# Patient Record
Sex: Male | Born: 1950
Health system: Southern US, Community
[De-identification: ages and names within clinical notes are randomized; demographics above are authoritative.]

## PROBLEM LIST (undated history)

## (undated) DIAGNOSIS — G4730 Sleep apnea, unspecified: Secondary | ICD-10-CM

## (undated) DIAGNOSIS — T7840XA Allergy, unspecified, initial encounter: Secondary | ICD-10-CM

## (undated) DIAGNOSIS — E669 Obesity, unspecified: Secondary | ICD-10-CM

## (undated) DIAGNOSIS — R011 Cardiac murmur, unspecified: Secondary | ICD-10-CM

## (undated) DIAGNOSIS — R9431 Abnormal electrocardiogram [ECG] [EKG]: Principal | ICD-10-CM

## (undated) DIAGNOSIS — J189 Pneumonia, unspecified organism: Secondary | ICD-10-CM

## (undated) DIAGNOSIS — J45909 Unspecified asthma, uncomplicated: Secondary | ICD-10-CM

## (undated) DIAGNOSIS — G709 Myoneural disorder, unspecified: Secondary | ICD-10-CM

## (undated) DIAGNOSIS — M1990 Unspecified osteoarthritis, unspecified site: Secondary | ICD-10-CM

## (undated) DIAGNOSIS — E785 Hyperlipidemia, unspecified: Secondary | ICD-10-CM

## (undated) DIAGNOSIS — K759 Inflammatory liver disease, unspecified: Secondary | ICD-10-CM

## (undated) DIAGNOSIS — N400 Enlarged prostate without lower urinary tract symptoms: Secondary | ICD-10-CM

## (undated) DIAGNOSIS — H269 Unspecified cataract: Secondary | ICD-10-CM

## (undated) DIAGNOSIS — F419 Anxiety disorder, unspecified: Secondary | ICD-10-CM

## (undated) DIAGNOSIS — I10 Essential (primary) hypertension: Secondary | ICD-10-CM

## (undated) DIAGNOSIS — I2510 Atherosclerotic heart disease of native coronary artery without angina pectoris: Secondary | ICD-10-CM

## (undated) DIAGNOSIS — C801 Malignant (primary) neoplasm, unspecified: Secondary | ICD-10-CM

## (undated) DIAGNOSIS — IMO0001 Reserved for inherently not codable concepts without codable children: Secondary | ICD-10-CM

## (undated) HISTORY — DX: Essential (primary) hypertension: I10

## (undated) HISTORY — PX: JOINT REPLACEMENT: SHX530

## (undated) HISTORY — DX: Allergy, unspecified, initial encounter: T78.40XA

## (undated) HISTORY — PX: OTHER SURGICAL HISTORY: SHX169

## (undated) HISTORY — DX: Unspecified cataract: H26.9

## (undated) HISTORY — DX: Hyperlipidemia, unspecified: E78.5

## (undated) HISTORY — PX: PROSTATECTOMY: SHX69

## (undated) HISTORY — PX: UMBILICAL HERNIA REPAIR: SHX196

## (undated) HISTORY — PX: TONSILLECTOMY AND ADENOIDECTOMY: SUR1326

---

## 1998-11-21 ENCOUNTER — Emergency Department (HOSPITAL_COMMUNITY): Admission: EM | Admit: 1998-11-21 | Discharge: 1998-11-21 | Payer: Self-pay | Admitting: Emergency Medicine

## 1999-05-10 ENCOUNTER — Ambulatory Visit (HOSPITAL_COMMUNITY): Admission: RE | Admit: 1999-05-10 | Discharge: 1999-05-10 | Payer: Self-pay | Admitting: Cardiology

## 1999-05-10 ENCOUNTER — Encounter: Payer: Self-pay | Admitting: Cardiology

## 2003-12-08 ENCOUNTER — Ambulatory Visit (HOSPITAL_COMMUNITY): Admission: RE | Admit: 2003-12-08 | Discharge: 2003-12-08 | Payer: Self-pay | Admitting: Cardiology

## 2003-12-13 ENCOUNTER — Encounter: Payer: Self-pay | Admitting: Internal Medicine

## 2003-12-21 ENCOUNTER — Ambulatory Visit (HOSPITAL_BASED_OUTPATIENT_CLINIC_OR_DEPARTMENT_OTHER): Admission: RE | Admit: 2003-12-21 | Discharge: 2003-12-21 | Payer: Self-pay | Admitting: Cardiology

## 2005-02-27 ENCOUNTER — Ambulatory Visit (HOSPITAL_COMMUNITY): Admission: RE | Admit: 2005-02-27 | Discharge: 2005-02-27 | Payer: Self-pay | Admitting: Orthopedic Surgery

## 2005-04-17 ENCOUNTER — Encounter: Payer: Self-pay | Admitting: Internal Medicine

## 2005-04-24 ENCOUNTER — Encounter: Payer: Self-pay | Admitting: Internal Medicine

## 2005-10-23 ENCOUNTER — Ambulatory Visit (HOSPITAL_COMMUNITY): Admission: RE | Admit: 2005-10-23 | Discharge: 2005-10-23 | Payer: Self-pay | Admitting: Surgery

## 2006-08-19 ENCOUNTER — Ambulatory Visit (HOSPITAL_COMMUNITY): Admission: RE | Admit: 2006-08-19 | Discharge: 2006-08-19 | Payer: Self-pay | Admitting: Neurosurgery

## 2006-09-05 ENCOUNTER — Encounter: Payer: Self-pay | Admitting: Internal Medicine

## 2006-09-08 ENCOUNTER — Encounter: Payer: Self-pay | Admitting: Internal Medicine

## 2006-12-12 ENCOUNTER — Ambulatory Visit (HOSPITAL_COMMUNITY): Admission: RE | Admit: 2006-12-12 | Discharge: 2006-12-12 | Payer: Self-pay | Admitting: Orthopedic Surgery

## 2006-12-17 ENCOUNTER — Encounter: Payer: Self-pay | Admitting: Internal Medicine

## 2006-12-22 ENCOUNTER — Ambulatory Visit (HOSPITAL_COMMUNITY): Admission: RE | Admit: 2006-12-22 | Discharge: 2006-12-23 | Payer: Self-pay | Admitting: Orthopedic Surgery

## 2007-08-28 ENCOUNTER — Encounter: Payer: Self-pay | Admitting: Internal Medicine

## 2008-11-30 ENCOUNTER — Encounter: Payer: Self-pay | Admitting: Pulmonary Disease

## 2009-01-30 ENCOUNTER — Emergency Department (HOSPITAL_COMMUNITY): Admission: EM | Admit: 2009-01-30 | Discharge: 2009-01-30 | Payer: Self-pay | Admitting: Family Medicine

## 2009-05-10 ENCOUNTER — Ambulatory Visit: Payer: Self-pay | Admitting: Internal Medicine

## 2009-05-10 DIAGNOSIS — I10 Essential (primary) hypertension: Secondary | ICD-10-CM | POA: Insufficient documentation

## 2009-05-10 DIAGNOSIS — G4733 Obstructive sleep apnea (adult) (pediatric): Secondary | ICD-10-CM | POA: Insufficient documentation

## 2009-05-10 DIAGNOSIS — J45909 Unspecified asthma, uncomplicated: Secondary | ICD-10-CM

## 2009-05-10 DIAGNOSIS — J309 Allergic rhinitis, unspecified: Secondary | ICD-10-CM

## 2009-05-10 DIAGNOSIS — Z9889 Other specified postprocedural states: Secondary | ICD-10-CM | POA: Insufficient documentation

## 2009-05-10 DIAGNOSIS — S82409A Unspecified fracture of shaft of unspecified fibula, initial encounter for closed fracture: Secondary | ICD-10-CM | POA: Insufficient documentation

## 2009-05-10 DIAGNOSIS — M66329 Spontaneous rupture of flexor tendons, unspecified upper arm: Secondary | ICD-10-CM | POA: Insufficient documentation

## 2009-05-10 DIAGNOSIS — Z9089 Acquired absence of other organs: Secondary | ICD-10-CM | POA: Insufficient documentation

## 2009-05-10 DIAGNOSIS — Z8619 Personal history of other infectious and parasitic diseases: Secondary | ICD-10-CM | POA: Insufficient documentation

## 2009-05-10 DIAGNOSIS — IMO0002 Reserved for concepts with insufficient information to code with codable children: Secondary | ICD-10-CM | POA: Insufficient documentation

## 2009-05-10 DIAGNOSIS — Z87898 Personal history of other specified conditions: Secondary | ICD-10-CM | POA: Insufficient documentation

## 2009-05-11 ENCOUNTER — Encounter (INDEPENDENT_AMBULATORY_CARE_PROVIDER_SITE_OTHER): Payer: Self-pay | Admitting: *Deleted

## 2009-05-11 LAB — CONVERTED CEMR LAB
ALT: 64 units/L — ABNORMAL HIGH (ref 0–53)
Albumin: 4.4 g/dL (ref 3.5–5.2)
Alkaline Phosphatase: 64 units/L (ref 39–117)
Basophils Relative: 0.6 % (ref 0.0–3.0)
Bilirubin, Direct: 0.1 mg/dL (ref 0.0–0.3)
CO2: 31 meq/L (ref 19–32)
Calcium: 9.1 mg/dL (ref 8.4–10.5)
Chloride: 100 meq/L (ref 96–112)
Creatinine, Ser: 0.7 mg/dL (ref 0.4–1.5)
Eosinophils Relative: 3 % (ref 0.0–5.0)
Hemoglobin: 14.8 g/dL (ref 13.0–17.0)
Lymphocytes Relative: 34.9 % (ref 12.0–46.0)
MCHC: 35.4 g/dL (ref 30.0–36.0)
MCV: 95.3 fL (ref 78.0–100.0)
Neutro Abs: 2.9 10*3/uL (ref 1.4–7.7)
Neutrophils Relative %: 53.1 % (ref 43.0–77.0)
RBC: 4.39 M/uL (ref 4.22–5.81)
Sodium: 141 meq/L (ref 135–145)
Total Protein: 7.6 g/dL (ref 6.0–8.3)
WBC: 5.3 10*3/uL (ref 4.5–10.5)

## 2009-05-12 ENCOUNTER — Encounter (INDEPENDENT_AMBULATORY_CARE_PROVIDER_SITE_OTHER): Payer: Self-pay | Admitting: *Deleted

## 2009-05-30 ENCOUNTER — Telehealth (INDEPENDENT_AMBULATORY_CARE_PROVIDER_SITE_OTHER): Payer: Self-pay | Admitting: *Deleted

## 2009-06-13 ENCOUNTER — Encounter (INDEPENDENT_AMBULATORY_CARE_PROVIDER_SITE_OTHER): Payer: Self-pay | Admitting: *Deleted

## 2009-06-14 ENCOUNTER — Ambulatory Visit: Payer: Self-pay | Admitting: Internal Medicine

## 2009-06-14 LAB — HM COLONOSCOPY

## 2009-06-26 ENCOUNTER — Telehealth (INDEPENDENT_AMBULATORY_CARE_PROVIDER_SITE_OTHER): Payer: Self-pay | Admitting: *Deleted

## 2009-06-28 ENCOUNTER — Ambulatory Visit: Payer: Self-pay | Admitting: Internal Medicine

## 2009-10-04 ENCOUNTER — Ambulatory Visit: Payer: Self-pay | Admitting: Internal Medicine

## 2009-10-04 LAB — CONVERTED CEMR LAB
Blood in Urine, dipstick: NEGATIVE
Nitrite: NEGATIVE
Protein, U semiquant: NEGATIVE
Urobilinogen, UA: 0.2
WBC Urine, dipstick: NEGATIVE

## 2009-10-05 ENCOUNTER — Encounter: Payer: Self-pay | Admitting: Internal Medicine

## 2009-10-06 LAB — CONVERTED CEMR LAB
BUN: 18 mg/dL (ref 6–23)
Creatinine,U: 59.3 mg/dL
Hgb A1c MFr Bld: 12.6 % — ABNORMAL HIGH (ref 4.6–6.5)
Microalb, Ur: 2.5 mg/dL — ABNORMAL HIGH (ref 0.0–1.9)
Potassium: 4.1 meq/L (ref 3.5–5.1)

## 2009-10-24 ENCOUNTER — Telehealth: Payer: Self-pay | Admitting: Internal Medicine

## 2009-11-08 ENCOUNTER — Encounter: Payer: Self-pay | Admitting: Internal Medicine

## 2009-11-27 ENCOUNTER — Telehealth (INDEPENDENT_AMBULATORY_CARE_PROVIDER_SITE_OTHER): Payer: Self-pay | Admitting: *Deleted

## 2009-11-30 ENCOUNTER — Ambulatory Visit: Payer: Self-pay | Admitting: Internal Medicine

## 2009-12-01 LAB — CONVERTED CEMR LAB
BUN: 16 mg/dL (ref 6–23)
Hgb A1c MFr Bld: 7.7 % — ABNORMAL HIGH (ref 4.6–6.5)
Microalb Creat Ratio: 1.2 mg/g (ref 0.0–30.0)

## 2009-12-12 ENCOUNTER — Telehealth (INDEPENDENT_AMBULATORY_CARE_PROVIDER_SITE_OTHER): Payer: Self-pay | Admitting: *Deleted

## 2009-12-19 ENCOUNTER — Telehealth (INDEPENDENT_AMBULATORY_CARE_PROVIDER_SITE_OTHER): Payer: Self-pay | Admitting: *Deleted

## 2010-01-08 ENCOUNTER — Encounter: Payer: Self-pay | Admitting: Internal Medicine

## 2010-03-07 ENCOUNTER — Ambulatory Visit: Payer: Self-pay | Admitting: Internal Medicine

## 2010-03-07 LAB — CONVERTED CEMR LAB: Hgb A1c MFr Bld: 6.1 % (ref 4.6–6.5)

## 2010-03-14 ENCOUNTER — Telehealth (INDEPENDENT_AMBULATORY_CARE_PROVIDER_SITE_OTHER): Payer: Self-pay | Admitting: *Deleted

## 2010-03-30 ENCOUNTER — Telehealth (INDEPENDENT_AMBULATORY_CARE_PROVIDER_SITE_OTHER): Payer: Self-pay | Admitting: *Deleted

## 2010-04-11 ENCOUNTER — Encounter
Admission: RE | Admit: 2010-04-11 | Discharge: 2010-04-11 | Payer: Self-pay | Source: Home / Self Care | Attending: Internal Medicine | Admitting: Internal Medicine

## 2010-05-14 ENCOUNTER — Encounter: Payer: Self-pay | Admitting: Internal Medicine

## 2010-05-14 ENCOUNTER — Telehealth (INDEPENDENT_AMBULATORY_CARE_PROVIDER_SITE_OTHER): Payer: Self-pay | Admitting: *Deleted

## 2010-05-30 ENCOUNTER — Ambulatory Visit: Payer: Self-pay | Admitting: Internal Medicine

## 2010-05-30 ENCOUNTER — Encounter: Payer: Self-pay | Admitting: Internal Medicine

## 2010-05-30 DIAGNOSIS — Z860100 Personal history of colon polyps, unspecified: Secondary | ICD-10-CM | POA: Insufficient documentation

## 2010-05-30 DIAGNOSIS — R9431 Abnormal electrocardiogram [ECG] [EKG]: Secondary | ICD-10-CM

## 2010-07-04 ENCOUNTER — Encounter: Payer: Self-pay | Admitting: Internal Medicine

## 2010-07-17 ENCOUNTER — Telehealth (INDEPENDENT_AMBULATORY_CARE_PROVIDER_SITE_OTHER): Payer: Self-pay | Admitting: *Deleted

## 2010-08-14 NOTE — Assessment & Plan Note (Signed)
Summary: URINATE ALOT/THIRSTY/WEIGHT LOSS/KDC   Vital Signs:  Patient profile:   60 year old male Weight:      275.6 pounds Temp:     98.6 degrees F oral Pulse rate:   64 / minute Resp:     14 per minute BP sitting:   142 / 78  (left arm) Cuff size:   large  Vitals Entered By: Shonna Chock (October 04, 2009 11:54 AM) CC: Frequent urination, weight loss, and thristy a lot x 3 weeks (? Diabetes) Comments REVIEWED MED LIST, PATIENT AGREED DOSE AND INSTRUCTION CORRECT    CC:  Frequent urination, weight loss, and and thristy a lot x 3 weeks (? Diabetes).  History of Present Illness: Weight loss 30# over 4 weeksin context of  polyuria, urgency ,polyphagia, & polydipsia.A1c previously  was 7%;no glucose monitoring.No specific diet ; CVE has decreased due  to fatigue.Nocturia is affecting sleep & contributing to fatigue. Statin never started due to PMH of hepatitis.  Allergies: 1)  ! Tetracycline  Review of Systems General:  Complains of fatigue and weight loss; denies chills, fever, and sweats. Eyes:  Complains of blurring; denies double vision and vision loss-both eyes. ENT:  Complains of nasal congestion and sinus pressure; No facial , frontal headache or purulence.. CV:  Complains of lightheadness; denies chest pain or discomfort and near fainting. Resp:  Complains of cough; denies sputum productive; Flu & bronchitis in 07/2009 , residual wheezing. Xoponex as needed . CPAP effective. GU:  Complains of nocturia and urinary frequency; denies discharge, dysuria, hematuria, and urinary hesitancy; Nocturia every 1.5-2 hrs. On Flomax for BPH. Derm:  Denies poor wound healing. Neuro:  Denies numbness and tingling; No burning. Endo:  Complains of excessive hunger, excessive thirst, excessive urination, polyuria, and weight change.  Physical Exam  General:  in no acute distress; alert,appropriate and cooperative throughout examination Ears:  External ear exam shows no significant lesions or  deformities.  Otoscopic examination reveals clear canals, tympanic membranes are intact bilaterally without bulging, retraction, inflammation or discharge. Hearing is grossly normal bilaterally. Nose:  External nasal examination shows no deformity or inflammation. Nasal mucosa are  dry without lesions or exudates. Mouth:  Oral mucosa and oropharynx without lesions or exudates.  Teeth in good repair. No pharyngeal erythema.   Lungs:  normal respiratory effort, no intercostal retractions, and no accessory muscle use.  Mild low grade wheezing  Heart:  Normal rate and regular rhythm. S1 and S2 normal without gallop, murmur, click, rub. S4 Pulses:  R and L carotid,radial,dorsalis pedis and posterior tibial pulses are full and equal bilaterally Extremities:  No clubbing, cyanosis, edema..   Neurologic:  sensation intact to light touch.   Cervical Nodes:  No lymphadenopathy noted Axillary Nodes:  No palpable lymphadenopathy Psych:  memory intact for recent and remote, normally interactive, and good eye contact.     Impression & Recommendations:  Problem # 1:  WEIGHT LOSS (ICD-783.21)  Orders: Venipuncture ([address]) TLB-A1C / Hgb A1C (Glycohemoglobin) (83036-A1C) TLB-Microalbumin/Creat Ratio, Urine (82043-MALB)  Problem # 2:  POLYURIA (UEA-540.98)  Orders: Venipuncture (11914) TLB-Creatinine, Blood (82565-CREA) TLB-Potassium (K+) (84132-K) TLB-BUN (Urea Nitrogen) (84520-BUN) TLB-A1C / Hgb A1C (Glycohemoglobin) (83036-A1C) TLB-Microalbumin/Creat Ratio, Urine (82043-MALB) T-Culture, Urine ([address]-62130)  Problem # 3:  POLYDIPSIA (ICD-783.5)  Orders: Venipuncture (86578) TLB-A1C / Hgb A1C (Glycohemoglobin) (83036-A1C) TLB-Microalbumin/Creat Ratio, Urine (82043-MALB)  Problem # 4:  HYPERLIPIDEMIA (ICD-272.4)  Orders: Venipuncture (46962)  Complete Medication List: 1)  Multivitamins Tabs (Multiple vitamin) .Marland Kitchen.. 1 by mouth once daily 2)  Diovan Hct 320-25 Mg Tabs  (Valsartan-hydrochlorothiazide) .Marland Kitchen.. 1 by mouth once daily 3)  Flomax 0.4 Mg Caps (Tamsulosin hcl) .Marland Kitchen.. 1 by mouth once daily 4)  Amlodipine Besylate 5 Mg Tabs (Amlodipine besylate) .... Once daily if bp averages > 135/85. 5)  Onetouch Delica Lancets Misc (Lancets) .... Test once daily 6)  Onetouch Ultra Test Strp (Glucose blood) .... Test once daily  Other Orders: UA Dipstick w/o Micro (manual) (61607)  Patient Instructions: 1)  Check your blood sugars regularly.FBS on   M,W,F & Sun; 2 hr post meal Tues after  b'fast ; Th after  lunch & post eve meal Sat. Goal 70-130 FBS & < 180 two hrs post meal. Kombiglyze XR 2.11/998 mg with eve meal. Symbicort 1-2 puffs two times a day ; gargle & spit after use. Prescriptions: ONETOUCH ULTRA TEST  STRP (GLUCOSE BLOOD) test once daily  #30 day x 2   Entered by:   Jeremy Johann CMA   Authorized by:   Marga Melnick MD   Signed by:   Jeremy Johann CMA on 10/04/2009   Method used:   Faxed to ...       [facility]* (retail)       [address]       [address] Shipping/mailing       [place], [place]  [address]       Ph: [phone]       Fax: [phone]   RxID:   [id number] Dola Argyle LANCETS  MISC (LANCETS) Test once daily  #30 day x 2   Entered by:   Jeremy Johann CMA   Authorized by:   Marga Melnick MD   Signed by:   Jeremy Johann CMA on 10/04/2009   Method used:   Faxed to ...       [facility]* (retail)       [address]       [address] Shipping/mailing       [place], [place]  [address]       Ph: [phone]       Fax: [phone]   RxID:   [id number]   Laboratory Results   Urine Tests    Routine Urinalysis   Color: straw Appearance: Clear Glucose: >=1000   (Normal Range: Negative) Bilirubin: negative   (Normal Range: Negative) Ketone: moderate (40)   (Normal Range: Negative) Spec. Gravity: 1.010   (Normal Range: 1.003-1.035) Blood: negative   (Normal  Range: Negative) pH: 6.0   (Normal Range: 5.0-8.0) Protein: negative   (Normal Range: Negative) Urobilinogen: 0.2   (Normal Range: 0-1) Nitrite: negative   (Normal Range: Negative) Leukocyte Esterace: negative   (Normal Range: Negative)

## 2010-08-14 NOTE — Progress Notes (Signed)
Summary: wants referral to opthamalogy.//HOP SEE  Phone Note Call from Patient Call back at [phone]   Caller: Patient Summary of Call: recently dx with diabetes, wants referral to opthamology, having some vision problems and hard to get in would like a referral .Kandice Hams  October 24, 2009 2:10 PM  Initial call taken by: Kandice Hams,  October 24, 2009 2:10 PM  Follow-up for Phone Call        see referral Follow-up by: Marga Melnick MD,  October 24, 2009 3:08 PM  New Problems: DIABETES MELLITUS, UNCONTROLLED (ICD-250.02)   New Problems: DIABETES MELLITUS, UNCONTROLLED (ICD-250.02)  Appended Document: wants referral to opthamalogy.//HOP SEE pt informed referral in process will be hearing from our referral coordinator with a date and time

## 2010-08-14 NOTE — Progress Notes (Signed)
Summary: refill  Phone Note Refill Request Message from:  Fax from Pharmacy on March 30, 2010 1:25 PM  Refills Requested: Medication #1:  FLOMAX 0.4 MG CAPS 1 by mouth once daily [place] [facility] - fax [phone]  Initial call taken by: Okey Regal Spring,  March 30, 2010 1:26 PM    Prescriptions: FLOMAX 0.4 MG CAPS (TAMSULOSIN HCL) 1 by mouth once daily  #90 Capsule x 1   Entered by:   Shonna Chock CMA   Authorized by:   Marga Melnick MD   Signed by:   Shonna Chock CMA on 03/30/2010   Method used:   Electronically to        [facility]* (retail)       [address]       [address] [place] Shipping/mailing       [place], [place]  [address]       Ph: [phone]       Fax: [phone]   RxID:   [id number]

## 2010-08-14 NOTE — Letter (Signed)
Summary: Med Link  Med Link   Imported By: Lanelle Bal 01/22/2010 11:07:41  _____________________________________________________________________  External Attachment:    Type:   Image     Comment:   External Document

## 2010-08-14 NOTE — Progress Notes (Signed)
Summary: Labs Due in 10-12 weeks  Phone Note Outgoing Call Call back at [facility] Phone [phone]   Summary of Call: Per Dr.Hopper please contact patient to schedule the following lab  repeat A1c in 10-12 weeks (250.02)  Rodney Hayes  December 19, 2009 9:02 AM    Follow-up for Phone Call        Patient is coming in 8.24.11 Follow-up by: Harold Barban,  December 19, 2009 12:55 PM

## 2010-08-14 NOTE — Progress Notes (Signed)
Summary: amlodipine refill  Phone Note Refill Request Message from:  Fax from Pharmacy on May 14, 2010 4:34 PM  Refills Requested: Medication #1:  AMLODIPINE BESYLATE 5 MG TABS once daily if BP AVERAGES > 135/85. [place] [facility]   fax [phone]    qty 60  Initial call taken by: Jerolyn Shin,  May 14, 2010 4:34 PM    Prescriptions: AMLODIPINE BESYLATE 5 MG TABS (AMLODIPINE BESYLATE) once daily if BP AVERAGES > 135/85.  #30 Tablet x 5   Entered by:   Shonna Chock CMA   Authorized by:   Marga Melnick MD   Signed by:   Shonna Chock CMA on 05/14/2010   Method used:   Electronically to        [facility]* (retail)       [address]       [address] [place] Shipping/mailing       [place], [place]  [address]       Ph: [phone]       Fax: [phone]   RxID:   [id number]

## 2010-08-14 NOTE — Progress Notes (Signed)
Summary: Refill Request(New Med)  Phone Note Refill Request Message from:  Pharmacy on Nov 27, 2009 10:15 AM  Refills Requested: Medication #1:  Kombiglyze XR 2.11/998 mg with eve meal   Dosage confirmed as above?Dosage Confirmed   Supply Requested: 3 months Patient was given samples at last office visit and would like to have a rx now.  [facility] Please call when you send so he knows it is ready for pick-up [phone]  Next Appointment Scheduled: none Initial call taken by: Harold Barban,  Nov 27, 2009 10:16 AM  Follow-up for Phone Call        #90; he needs A1c, BUN,creat,K+, urine microalbumin (250.02).THESE CAN BE DONE @ [facility] ( he works [employer]) Follow-up by: Marga Melnick MD,  Nov 28, 2009 5:58 AM  Additional Follow-up for Phone Call Additional follow up Details #1::        Patient aware rx sent and scheduled labs for this Thursday Additional Follow-up by: Shonna Chock,  Nov 28, 2009 8:54 AM    New/Updated Medications: KOMBIGLYZE XR 2.11-998 MG XR24H-TAB (SAXAGLIPTIN-METFORMIN) 1 by mouth once daily Prescriptions: KOMBIGLYZE XR 2.11-998 MG XR24H-TAB (SAXAGLIPTIN-METFORMIN) 1 by mouth once daily  #90 x 0   Entered by:   Shonna Chock   Authorized by:   Marga Melnick MD   Signed by:   Shonna Chock on 11/28/2009   Method used:   Electronically to        [facility]* (retail)       [address]       [address] [place] Shipping/mailing       [place], [place]  [address]       Ph: [phone]       Fax: [phone]   RxID:   [id number]

## 2010-08-14 NOTE — Progress Notes (Signed)
Summary: REFILL   Phone Note Refill Request Message from:  Fax from Pharmacy on March 14, 2010 9:59 AM  Refills Requested: Medication #1:  KOMBIGLYZE XR 2.11-998 MG XR24H-TAB 1 by mouth once daily. [facility] Valinda Hoar [id number]  Initial call taken by: Okey Regal Spring,  March 14, 2010 10:01 AM  Follow-up for Phone Call        I spoke with patient and informed her per labs done 02/2010 patient was to see Dr.Hopper before refilling meds, patient said he is due for a CPX (scheduled for 05/2010), Patient would like a refill to last until that appointment Follow-up by: Shonna Chock CMA,  March 14, 2010 10:10 AM    Prescriptions: KOMBIGLYZE XR 2.11-998 MG XR24H-TAB (SAXAGLIPTIN-METFORMIN) 1 by mouth once daily  #90 x 0   Entered by:   Shonna Chock CMA   Authorized by:   Marga Melnick MD   Signed by:   Shonna Chock CMA on 03/14/2010   Method used:   Electronically to        [facility]* (retail)       [address]       [address] [place] Shipping/mailing       [place], [place]  [address]       Ph: [phone]       Fax: [phone]   RxID:   [id number]

## 2010-08-14 NOTE — Progress Notes (Signed)
Summary: Refill Request  Phone Note Refill Request Call back at [phone] Message from:  Pharmacy on Dec 12, 2009 3:55 PM  Refills Requested: Medication #1:  DIOVAN HCT 320-25 MG TABS 1 by mouth once daily   Dosage confirmed as above?Dosage Confirmed   Supply Requested: 3 months   Last Refilled: 09/15/2009 [facility]  Next Appointment Scheduled: none Initial call taken by: Harold Barban,  Dec 12, 2009 3:56 PM    Prescriptions: DIOVAN HCT 320-25 MG TABS (VALSARTAN-HYDROCHLOROTHIAZIDE) 1 by mouth once daily  #90 x 1   Entered by:   Shonna Chock   Authorized by:   Marga Melnick MD   Signed by:   Shonna Chock on 12/12/2009   Method used:   Electronically to        [name]* (retail)       [address]       [address] [place] Shipping/mailing       [place], [place]  [address]       Ph: [phone]       Fax: [phone]   RxID:   [id number]

## 2010-08-14 NOTE — Letter (Signed)
Summary: Health Screening/Healthgram  Health Screening/Health[facility]   Imported By: Lanelle Bal 06/08/2010 11:56:30  _____________________________________________________________________  External Attachment:    Type:   Image     Comment:   External Document

## 2010-08-14 NOTE — Assessment & Plan Note (Signed)
Summary: CPX/Fasting labs/scm   Vital Signs:  Patient profile:   60 year old male Height:      71 inches Weight:      311 pounds Temp:     99.0 degrees F oral Pulse rate:   68 / minute Resp:     20 per minute BP sitting:   140 / 68  (left arm)  Vitals Entered By: Rodney Hayes CMA (May 30, 2010 11:18 AM) CC: cpx, copy of labs provided, Type 2 diabetes mellitus follow-up   CC:  cpx, copy of labs provided, and Type 2 diabetes mellitus follow-up.  History of Present Illness:        Rodney Hayes is here for a physical; he is having post traumatic pain L knee. Steroid injection helped , but it exacerbated Diabetes.FBS rose to 208 X 24 hrs. Type 2 Diabetes Mellitus Follow-Up       The patient reports self managed hypoglycemia and weight gain of 32 #, but denies polyuria, polydipsia, blurred vision, and numbness of extremities.  The patient denies the following symptoms: neuropathic pain, chest pain, vomiting, orthostatic symptoms, poor wound healing, intermittent claudication, vision loss, and foot ulcer.  Since the last visit the patient reports "moderately  poor" dietary compliance and exercising regularly daily > 60 minutes.  The patient has been measuring capillary blood glucose before breakfast( 62-140) and  2 hrs after meals < 120.  Since the last visit, the patient reports having had eye care by an Ophthalmologist,no retinopathy.  Lipids : LDL 103, HDL 43, TG 244. A1c was 5.8% two weeks ago. Hypertension Follow-Up       The patient reports  occasional edema, but denies lightheadedness, headaches, and excessive  fatigue.  Adjunctive measures currently used by the patient include modified  salt restriction. BP 136-155/76-88.  NSAIDS employed for knee  have increased BP .  Current Medications (verified): 1)  Multivitamins  Tabs (Multiple Vitamin) .Marland Kitchen.. 1 By Mouth Once Daily 2)  Diovan Hct 320-25 Mg Tabs (Valsartan-Hydrochlorothiazide) .Marland Kitchen.. 1 By Mouth Once Daily 3)  Flomax 0.4 Mg Caps  (Tamsulosin Hcl) .Marland Kitchen.. 1 By Mouth Once Daily 4)  Amlodipine Besylate 5 Mg Tabs (Amlodipine Besylate) .... Once Daily If Bp Averages > 135/85. 5)  Onetouch Delica Lancets  Misc (Lancets) .... Test Once Daily 6)  Onetouch Ultra Test  Strp (Glucose Blood) .... Test Once Daily 7)  Glimepiride 4 Mg Tabs (Glimepiride) .Marland Kitchen.. 1 Once Daily 8)  Kombiglyze Xr 2.11-998 Mg Xr24h-Tab (Saxagliptin-Metformin) .Marland Kitchen.. 1 By Mouth Once Daily  Allergies (verified): 1)  ! Tetracycline  Past History:  Past Medical History: Hypertension Allergic rhinitis, PMH of  Asthma, PMH of in Childhood (Inactive) Hepatitis B, PMH of 1978 post needle stick (Renal Transplant patient) +TB skin test (no INH prophylaxis); Sleep Apnea 2004, Dr Shelle Iron Hyperlipidemia Colonic polyps, PMH  of  Past Surgical History: Tibula/ Fibula Fracture (no surgery) 1969; Arthroscopy 2004, Dr Despina Hick; Umbilical hernia repair, Dr Scherrie Gerlach biceps rupture ,S/P implant, Dr Ranell Patrick Colon polypectomy, Dr Leone Payor  Family History: Father: CAD, DM,prostate  cancer Mother: non Small Cell Lung cancer  Siblings: none;   Social History: Occupation: Runner, broadcasting/film/video [employer] Married Former Smoker: quit 1994 Alcohol use-yes: 1 glass of wine /day Regular exercise-yes  Review of Systems  The patient denies anorexia, fever, decreased hearing, hoarseness, prolonged cough, hemoptysis, abdominal pain, melena, hematochezia, severe indigestion/heartburn, hematuria, incontinence, suspicious skin lesions, depression, unusual weight change, abnormal bleeding, enlarged lymph nodes, and angioedema.         "  Neuropathic pain " LUE since flu shot 2 weeks ago.  Physical Exam  General:  well-nourished;alert,appropriate and cooperative throughout examination Head:  Normocephalic and atraumatic without obvious abnormalities. No apparent alopecia ; moustache Eyes:  No corneal or conjunctival inflammation noted.  Perrla. Funduscopic exam benign, without hemorrhages, exudates or  papilledema.  Ears:  External ear exam shows no significant lesions or deformities.  Otoscopic examination reveals clear canals, tympanic membranes are intact bilaterally without bulging, retraction, inflammation or discharge. Hearing is grossly normal bilaterally. Nose:  External nasal examination shows no deformity or inflammation. Nasal mucosa are pink and moist without lesions or exudates. Mouth:  Oral mucosa and oropharynx without lesions or exudates.  Teeth in good repair. Neck:  No deformities, masses, or tenderness noted. Lungs:  Normal respiratory effort, chest expands symmetrically. Lungs are clear to auscultation, no crackles or wheezes. Heart:  normal rate, regular rhythm, no gallop, no rub, no JVD, no HJR, and grade 1/2  /6 systolic murmur.   Abdomen:  Bowel sounds positive,abdomen soft and non-tender without masses, organomegaly or hernias noted. Rectal:  Dr Leone Payor Prostate:  Dr Leone Payor. PSA 3.1 this month Msk:  No deformity or scoliosis noted of thoracic or lumbar spine.   Pulses:  R and L carotid,radial,femoral,dorsalis pedis and posterior tibial pulses are full and equal bilaterally Extremities:  No clubbing, cyanosis. Crepitus knees ; L knee effusion with decreased ROM Neurologic:  alert & oriented X3.   Skin:  Intact without suspicious lesions or rashes Cervical Nodes:  No lymphadenopathy noted Axillary Nodes:  No palpable lymphadenopathy Psych:  memory intact for recent and remote, normally interactive, and good eye contact.     Impression & Recommendations:  Problem # 1:  ROUTINE GENERAL MEDICAL EXAM@HEALTH  CARE FACL (ICD-V70.0)  Orders: EKG w/ Interpretation (93000)  Problem # 2:  DIABETES MELLITUS, CONTROLLED (ICD-250.00)  The following medications were removed from the medication list:    Glimepiride 4 Mg Tabs (Glimepiride) .Marland Kitchen... 1 once daily His updated medication list for this problem includes:    Diovan Hct 320-25 Mg Tabs (Valsartan-hydrochlorothiazide)  .Marland Kitchen... 1 by mouth once daily    Kombiglyze Xr 2.11-998 Mg Xr24h-tab (Saxagliptin-metformin) .Marland Kitchen... 1 by mouth  two times a day  with meals  Problem # 3:  HYPERTENSION (ICD-401.9)  His updated medication list for this problem includes:    Diovan Hct 320-25 Mg Tabs (Valsartan-hydrochlorothiazide) .Marland Kitchen... 1 by mouth once daily  Problem # 4:  HYPERLIPIDEMIA (ICD-272.4) no statin due to PMH of Hepatitis B  Problem # 5:  NONSPECIFIC ABNORMAL ELECTROCARDIOGRAM (ICD-794.31) NS ST-T changes inferolaterally , new since 2009, stable vs 2010  Complete Medication List: 1)  Multivitamins Tabs (Multiple vitamin) .Marland Kitchen.. 1 by mouth once daily 2)  Diovan Hct 320-25 Mg Tabs (Valsartan-hydrochlorothiazide) .Marland Kitchen.. 1 by mouth once daily 3)  Flomax 0.4 Mg Caps (Tamsulosin hcl) .Marland Kitchen.. 1 by mouth once daily 4)  Amlodipine Besylate 10 Mg Tabs (amlodipine Besylate)  .Marland Kitchen.. 1 once daily 5)  Onetouch Delica Lancets Misc (Lancets) .... Test once daily 6)  Onetouch Ultra Test Strp (Glucose blood) .... Test once daily 7)  Kombiglyze Xr 2.11-998 Mg Xr24h-tab (Saxagliptin-metformin) .Marland Kitchen.. 1 by mouth  two times a day  with meals  Patient Instructions: 1)  Consume LESS THAN 40 grams of High Fructose Corn Syrup sugar/ day as discussed. 2)  Please schedule a follow-up appointment in 4 months. 3)  Hepatic Panel prior to visit, ICD-9:995.20 4)  Lipid Panel prior to visit, ICD-9:277.7 5)  HbgA1C prior to  visit, ICD-9:250.00 6)  Urine Microalbumin prior to visit, ICD-9:250.00 7)  It is important that you exercise regularly at least 20 minutes 5 times a week. If you develop chest pain, have severe difficulty breathing, or feel very tired , stop exercising immediately and seek medical attention. Nucear Stress test if having chest pain with CVE Prescriptions: DIOVAN HCT 320-25 MG TABS (VALSARTAN-HYDROCHLOROTHIAZIDE) 1 by mouth once daily  #90 x 3   Entered and Authorized by:   Marga Melnick MD   Signed by:   Marga Melnick MD on  05/30/2010   Method used:   Faxed to ...       [facility]* (retail)       [address]       [address] Shipping/mailing       [place], [place]  [address]       Ph: [phone]       Fax: [phone]   RxID:   [id number] KOMBIGLYZE XR 2.11-998 MG XR24H-TAB (SAXAGLIPTIN-METFORMIN) 1 by mouth  two times a day  with meals  #180 x 1   Entered and Authorized by:   Marga Melnick MD   Signed by:   Marga Melnick MD on 05/30/2010   Method used:   Faxed to ...       [facility]* (retail)       [address]       [address] Shipping/mailing       [place], [place]  [address]       Ph: [phone]       Fax: [phone]   RxID:   [id number] AMLODIPINE BESYLATE 10 MG TABS (AMLODIPINE BESYLATE) 1 once daily  #90 x 3   Entered and Authorized by:   Marga Melnick MD   Signed by:   Marga Melnick MD on 05/30/2010   Method used:   Faxed to ...       [facility]* (retail)       [address]       [address] Shipping/mailing       [place], [place]  [address]       Ph: [phone]       Fax: [phone]   RxID:   [id number]    Orders Added: 1)  EKG w/ Interpretation [93000] 2)  Est. Patient 40-64 years [phone]

## 2010-08-14 NOTE — Progress Notes (Signed)
Summary: BP Log Brought by Patient  BP Log Brought by Patient   Imported By: Lanelle Bal 06/08/2010 11:55:37  _____________________________________________________________________  External Attachment:    Type:   Image     Comment:   External Document

## 2010-08-16 NOTE — Letter (Signed)
Summary: Med Link  Med Link   Imported By: Lanelle Bal 07/18/2010 11:48:15  _____________________________________________________________________  External Attachment:    Type:   Image     Comment:   External Document

## 2010-08-16 NOTE — Progress Notes (Signed)
Summary: Refill Request  Phone Note Refill Request Call back at [phone] Message from:  Pharmacy on July 17, 2010 10:32 AM  Refills Requested: Medication #1:  ONETOUCH ULTRA TEST  STRP test once daily   Dosage confirmed as above?Dosage Confirmed   Supply Requested: 100   Last Refilled: 12/20/2009 [facility]  Next Appointment Scheduled: none Initial call taken by: Harold Barban,  July 17, 2010 10:33 AM    Prescriptions: [place]n Bound TEST  STRP (GLUCOSE BLOOD) test once daily  #100 Each x 3   Entered by:   Shonna Chock CMA   Authorized by:   Marga Melnick MD   Signed by:   Shonna Chock CMA on 07/17/2010   Method used:   Electronically to        [facility]* (retail)       [address]       [address] [place] [employer]       [place], [place]  [address]       Ph: [phone]       Fax: [phone]   RxID:   [id number]

## 2010-08-17 NOTE — Letter (Signed)
Summary: [facility]  [facility]   Imported By: Lanelle Bal 11/15/2009 10:07:51  _____________________________________________________________________  External Attachment:    Type:   Image     Comment:   External Document

## 2010-09-03 ENCOUNTER — Telehealth: Payer: Self-pay | Admitting: Internal Medicine

## 2010-09-11 NOTE — Progress Notes (Signed)
Summary: med not available   Phone Note Refill Request Message from:  Fax from Pharmacy on September 03, 2010 10:15 AM  Refills Requested: Medication #1:  VALSARTAN/HYDROCHLOROTHI TAKE ONE TABLET BY MOUTH EVERY DAY [facility] - fax [phone]- - *note this med is on back order from the manufacturer. - would you like to change rx?  Initial call taken by: Okey Regal [place],  September 03, 2010 10:19 AM  Follow-up for Phone Call        Dr.Calix Heinbaugh please advise  Follow-up by: Shonna Chock CMA,  September 03, 2010 10:39 AM  Additional Follow-up for Phone Call Additional follow up Details #1::        change to Losartan 100 & HCTZ 25 mg  #90 of each Additional Follow-up by: Marga Melnick MD,  September 03, 2010 1:50 PM    New/Updated Medications: LOSARTAN POTASSIUM 100 MG TABS (LOSARTAN POTASSIUM) 1 by mouth once daily HYDROCHLOROTHIAZIDE 25 MG TABS (HYDROCHLOROTHIAZIDE) 1 by mouth once daily Prescriptions: HYDROCHLOROTHIAZIDE 25 MG TABS (HYDROCHLOROTHIAZIDE) 1 by mouth once daily  #90 x 0   Entered by:   Lucious Groves CMA   Authorized by:   Marga Melnick MD   Signed by:   Lucious Groves CMA on 09/03/2010   Method used:   Electronically to        [facility]* (retail)       [address]       [address] [place] [employer]       [place], [place]  [address]       Ph: [phone]       Fax: [phone]   RxID:   [id number] LOSARTAN POTASSIUM 100 MG TABS (LOSARTAN POTASSIUM) 1 by mouth once daily  #90 x 0   Entered by:   Lucious Groves CMA   Authorized by:   Marga Melnick MD   Signed by:   Lucious Groves CMA on 09/03/2010   Method used:   Electronically to        [facility]* (retail)       [address]       [address] [place] [employer]       [place], [place]  [address]       Ph: [phone]       Fax: [phone]   RxID:   [id number]

## 2010-10-08 ENCOUNTER — Other Ambulatory Visit: Payer: Self-pay | Admitting: Internal Medicine

## 2010-11-27 NOTE — Op Note (Signed)
NAME:  Rodney Hayes, Rodney Hayes NO.:  [id number]   MEDICAL RECORD NO.:  [id number]          PATIENT TYPE:  OIB   LOCATION:  1619                         FACILITY:  [facility]   PHYSICIAN:  Almedia Balls. Ranell Patrick, M.D. DATE OF BIRTH:  Nov 26, 1950   DATE OF PROCEDURE:  DATE OF DISCHARGE:  12/23/2006                               OPERATIVE REPORT   PREOPERATIVE DIAGNOSIS:  Left distal biceps rupture.   POSTOPERATIVE DIAGNOSIS:  Left distal biceps rupture.   PROCEDURE:  Left distal biceps repair using Endo button single incision  technique.   SURGEON:  Almedia Balls. Ranell Patrick, M.D.   ASSISTANT:  Donnie Coffin. Dixon, P.A.-C   ANESTHESIA:  LMA plus axillary block was used.   ESTIMATED BLOOD LOSS:  Less than 50 mL.   FLUID REPLACEMENT:  1600 mL.   INSTRUMENT COUNT:  Correct.   COMPLICATIONS:  None.   ANTIBIOTICS GIVEN:  None.   INDICATIONS:  The patient is a 60 year old male with a history of a left  arm injury after a fall.  The patient sustained a tear to his distal  biceps.  The patient presented clinically with the torn distal biceps  which was confirmed by MRI scan.  The patient presents now for operative  repair of his distal biceps tendon to restore supination strength and  endurance and be in contour to his arm.  Informed consent was obtained.   DESCRIPTION OF PROCEDURE:  After an adequate level of anesthesia was  achieved.  The patient was positioned supine on the operating room  table.  We utilized an arm board and had a sterile drape.  We went ahead  after sterile prep and drape and made a longitudinal  skin incision of  the radial tuberosity in the forearm.  This was done using a 10-blade  scalpel.  Bovie electrocautery was used to control hemostasis.  Blunt  dissection was done through subcutaneous tissues.  We were careful to  protect the superficial antecubital nerves.  At this point, we  identified the plane in which the biceps tendon had coursed; and found  the biceps  tendon curled up in the proximal arm.  We grabbed that and  pulled it down; and then identified the superficial and deep portions to  it; freshened up the into the biceps tendon removing all tendon that did  not look healthy; and then using the #2 FiberWire suture to whip stitch  in a [place] fashion.  The end of the biceps, we ran up for about 4 to 5  cm on the tendon; getting excellent purchase on the tendon.  We then  went ahead and placed that tendon back up under the soft tissue of the  arm to keep it later in the surgery.   We then performed an antecubital dissection down onto the radial  tuberosity utilizing blunt the section technique; and a combination of  Hemoclips and ties; basically ligating the crossing vascular leash found  in this area.  These were the radial recurrent vessels.  At this point,  gaining access to the radial tuberosity, we identified the location of  where the biceps had torn off.  We cleared it of soft tissues using  rongeur and Therapist, nutritional; verified the location with the fluoro scan;  and then went ahead and used a Bur to open up an appropriately sized  hole to receive the sutured tendon.   We then went ahead and drilled with a beef pin out the far side of the  radius; and we were careful to angle away from the ulna.  We thoroughly  irritated between all steps where we used an oval bur; and then we over  drilled with the Endo button drill bit; and then passed the kite string  sutures for the Endo button out the far side of the arm.  We then  checked to make sure that we reduced our tendon into the hole and  flipped the Endo button under direct visualization, with C-arm, on the  far side of the radius.  We had a nice secure connection.  We could  easily extend the elbow to 30-degrees minus full extension without any  undue tension to the repair.   We thoroughly irritated, the entire wound; and then went ahead and  closed the subcutaneous tissues with  2-0 Vicryl followed by 4-0  monochrome for the skin.  Steri-Strips applied followed by a sterile  dressing; and the arm was placed in a long arm splint.      Almedia Balls. Ranell Patrick, M.D.  Electronically Signed     SRN/MEDQ  D:  12/22/2006  T:  12/23/2006  Job:  [id number]

## 2010-11-30 ENCOUNTER — Other Ambulatory Visit: Payer: Self-pay | Admitting: Internal Medicine

## 2010-11-30 NOTE — Op Note (Signed)
NAME:  Rodney Hayes, Rodney Hayes NO.:  [id number]   MEDICAL RECORD NO.:  [id number]          PATIENT TYPE:  AMB   LOCATION:  DAY                          FACILITY:  [facility]   PHYSICIAN:  Ollen Gross, M.D.    DATE OF BIRTH:  [birth year]   DATE OF PROCEDURE:  02/27/2005  DATE OF DISCHARGE:                                 OPERATIVE REPORT   PREOPERATIVE DIAGNOSIS:  Left knee medial meniscal tear and chondral defect.   POSTOPERATIVE DIAGNOSIS:  Left knee medial meniscal tear and chondral  defect.   PROCEDURE:  Left knee arthroscopy with meniscal debridement and  chondroplasty.   SURGEON:  Ollen Gross, M.D.   ASSISTANT:  None.   ANESTHESIA:  Local with MAC.   ESTIMATED BLOOD LOSS:  Minimal.   DRAINS:  None.   COMPLICATIONS:  Stable to recovery.   BRIEF CLINICAL NOTE:  Rodney Hayes is a 60 year old male who has had a several  month history of progressively worsening left knee pain, recurrent effusions  and mechanical symptoms. Exam and history were consistent with meniscal tear  plus/minus chondral defect and this was confirmed by MRI. He presents now  for arthroscopy and debridement.   PROCEDURE IN DETAIL:  After successful administration of local with MAC  anesthetic, a tourniquet was placed high on the left thigh and left lower  extremity prepped and draped in the usual sterile fashion. A standard  superomedial and inferolateral incisions were made, inflow cannula passed  superomedial, camera passed inferolateral. Arthroscopic visualization  proceeds. The undersurface of patella showed some grade 2 chondromalacia  with no focal chondral defects. He had a fair amount of synovitis in the  suprapatellar pouch. The trochlea does show a very large central defect  grade 3 with no exposed bone. There appears to be unstable cartilage in the  defect and on the edges. The rest of the trochlea looks fine. The medial and  lateral gutters are visualized, there are no loose bodies.  Flexion and  valgus force are applied to the knee and the medial compartment is entered.  He does have evidence of a chondral defect in the medial compartment as well  as medial meniscal tear. A spinal needle was used to localize the  inferomedial portal, small incision made and dilator placed. The meniscus  was debrided back to a stable base with baskets and a 4.2 mm shaver. The  chondral defect on the medial femoral condyle was debrided back to a stable  cartilaginous base with stable edges using the shaver. There was no  completely exposed bone but there was very thin cartilage covering the bone  in an area of 1 x 2 cm.   The intercondylar notch was visualized and the ACL appears normal. The  lateral compartment is entered and it is normal. We then addressed the  patellofemoral joint and the shaver is used to debride the grade 2  chondromalacia on the undersurface of the patella back to a stable  cartilaginous base. The unstable cartilage in that defect and the trochlea  is debrided back to stable base with very thin cartilage and  with stable  cartilaginous edges. I sealed off the edges with the ArthroCare device just  at the areas where it looked to be unstable. The ArthroCare was then used to  debride the hypertrophic synovium and the suprapatellar area. At this point  the arthroscopic equipment was removed from the inferior portals which were  closed with interrupted 4-0 nylon. 20 mL of 0.25% Marcaine with epi are  injected through the inflow cannula and then that cannula was removed and  the incision was closed with interrupted 4-0 nylon. A bulky sterile dressing  was applied and he was awakened and transferred to recovery in stable  condition.      Ollen Gross, M.D.  Electronically Signed     FA/MEDQ  D:  02/27/2005  T:  02/28/2005  Job:  [id number]

## 2010-11-30 NOTE — Telephone Encounter (Signed)
2)  Please schedule a follow-up appointment in 4 months. 3)  Hepatic Panel prior to visit, ICD-9:995.20 4)  Lipid Panel prior to visit, ICD-9:277.7 5)  HbgA1C prior to visit, ICD-9:250.00 6)  Urine Microalbumin prior to visit, ICD-9:250.00 Copied from 05/2010 patient instructions

## 2010-11-30 NOTE — Op Note (Signed)
NAME:  Rodney Hayes, CLINCH NO.:  [id number]   MEDICAL RECORD NO.:  [id number]          PATIENT TYPE:  AMB   LOCATION:  DAY                          FACILITY[facility]   PHYSICIAN:  Velora Heckler, MD      DATE OF BIRTH:  Apr 29, 1951   DATE OF PROCEDURE:  10/23/2005  DATE OF DISCHARGE:                                 OPERATIVE REPORT   PREOPERATIVE DIAGNOSIS:  Umbilical hernia.   POSTOPERATIVE DIAGNOSIS:  Incarcerated umbilical hernia.   PROCEDURE:  Repair, incarcerated umbilical hernia, with Ethicon Ultrapro  mesh.   SURGEON:  Velora Heckler, M.D., FACS   ANESTHESIA:  General per Dr. Ronelle Nigh.   ESTIMATED BLOOD LOSS:  Minimal.   PREPARATION:  Betadine.   COMPLICATIONS:  None.   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  He lives in [place].  He has an umbilical  hernia which has been present for some time, but gradually increasing in  size.  He has developed periumbilical pain.  He has only been able to  partially reduce the hernia.  He now comes to surgery for repair.    The procedure is done in OR #11 at the [facility].  The  patient is brought to the operating room, placed in supine position on the  operating room table.  Following administration of general anesthesia the  patient is prepped and draped in the  usual strict aseptic fashion.  After  ascertaining that an adequate level of anesthesia had been obtained, the  skin beneath the umbilicus is anesthetized with local anesthetic.  A 4 cm  incision is made transversely with a #10 blade.  Dissection is carried down  through subcutaneous tissues and hemostasis obtained with electrocautery.  Dissection is carried down along the hernia sac to the fascia.  Base of the  hernia sac is defined circumferentially.  Umbilicus is then separated from  the underlying hernia sac.  Underlying sac is quite inflamed, thick-walled.  Upon opening the sac there is cherry-red fluid present within the sac.  The  lining of the sac is quite erythematous.  The sac is incised down to the  fascia.  Small bowel loops are noted within the peritoneal cavity.  The sac  is then excised using the electrocautery for hemostasis.  Omentum is gently  grasped and placed beneath the defect.  The fascial defect is then closed  with interrupted #1 Ethibond sutures transversely.  Next a sheet of the  Ethicon Ultrapro mesh was trimmed to the appropriate dimensions and placed  as an onlay patch over the repair.  Fascial plane is developed  circumferentially to allow for the mesh to lie in  close approximation to  the rectus fascia.  Mesh is secured circumferentially with interrupted 0  Ethibond sutures.  A 0 Ethibond  suture is also used to affix the deep  aspect of the umbilicus to the abdominal wall.  Good hemostasis is noted.  Wound is irrigated with saline which is evacuated.  Subcutaneous tissues are  reapproximated with  interrupted 3-0 Vicryl sutures.  Local field  block is placed with Marcaine.  Skin is closed with running 4-0 Vicryl  subcuticular suture.  Wound is washed and dried and Benzoin and Steri-Strips  are applied.  Cotton balls are placed in the umbilicus.  Sterile gauze  dressings are placed.  The patient tolerated the procedure well.      Velora Heckler, MD  Electronically Signed     TMG/MEDQ  D:  10/23/2005  T:  10/23/2005  Job:  [id number]   cc:   Marcelyn Bruins, M.D. [facility]  [address]  [place]  [place] [address]   Madaline Savage, M.D.  Fax: [phone]

## 2010-12-06 ENCOUNTER — Other Ambulatory Visit: Payer: Self-pay | Admitting: Internal Medicine

## 2010-12-06 NOTE — Telephone Encounter (Signed)
Called rx in, rx was filled on 11/30/10 electronically (? If rx went through)

## 2010-12-12 ENCOUNTER — Telehealth: Payer: Self-pay | Admitting: Internal Medicine

## 2010-12-12 NOTE — Telephone Encounter (Signed)
error 

## 2010-12-21 ENCOUNTER — Other Ambulatory Visit: Payer: Self-pay | Admitting: Internal Medicine

## 2010-12-21 ENCOUNTER — Other Ambulatory Visit (INDEPENDENT_AMBULATORY_CARE_PROVIDER_SITE_OTHER): Payer: Commercial Managed Care - PPO

## 2010-12-21 DIAGNOSIS — E8881 Metabolic syndrome: Secondary | ICD-10-CM

## 2010-12-21 DIAGNOSIS — E119 Type 2 diabetes mellitus without complications: Secondary | ICD-10-CM

## 2010-12-21 DIAGNOSIS — T887XXA Unspecified adverse effect of drug or medicament, initial encounter: Secondary | ICD-10-CM

## 2010-12-21 LAB — HEPATIC FUNCTION PANEL
Bilirubin, Direct: 0.1 mg/dL (ref 0.0–0.3)
Total Protein: 7.9 g/dL (ref 6.0–8.3)

## 2010-12-21 LAB — LIPID PANEL
HDL: 48.2 mg/dL (ref >39.00)
Total CHOL/HDL Ratio: 4
Triglycerides: 134 mg/dL (ref 0.0–149.0)
VLDL: 26.8 mg/dL (ref 0.0–40.0)

## 2010-12-21 LAB — MICROALBUMIN / CREATININE URINE RATIO: Microalb Creat Ratio: 5.9 mg/g (ref 0.0–30.0)

## 2011-01-01 ENCOUNTER — Other Ambulatory Visit: Payer: Self-pay | Admitting: Internal Medicine

## 2011-02-25 ENCOUNTER — Other Ambulatory Visit: Payer: Self-pay | Admitting: Internal Medicine

## 2011-04-03 ENCOUNTER — Other Ambulatory Visit: Payer: Self-pay | Admitting: Internal Medicine

## 2011-04-03 MED ORDER — SAXAGLIPTIN-METFORMIN ER 2.5-1000 MG PO TB24: ORAL_TABLET | ORAL | Status: DC

## 2011-04-03 NOTE — Telephone Encounter (Signed)
a1c 250.00  

## 2011-04-08 ENCOUNTER — Telehealth: Payer: Self-pay

## 2011-04-08 MED ORDER — SAXAGLIPTIN-METFORMIN ER 2.5-1000 MG PO TB24: ORAL_TABLET | ORAL | Status: DC

## 2011-04-08 NOTE — Telephone Encounter (Signed)
Pt called and stated he would like a return call.  Pt states he either needs to have lab work done or an office visit with Dr. Alwyn Ren

## 2011-04-08 NOTE — Telephone Encounter (Signed)
Pt has scheduled an appt for Oct.1, 2012 at 4 pm for hga1c.  Pt would like a 90 day supply of Kombiglyze XR called in to pharmacy.

## 2011-04-08 NOTE — Telephone Encounter (Signed)
RX re-sent, future order placed

## 2011-04-15 ENCOUNTER — Other Ambulatory Visit (INDEPENDENT_AMBULATORY_CARE_PROVIDER_SITE_OTHER): Payer: Commercial Managed Care - PPO

## 2011-04-15 DIAGNOSIS — E119 Type 2 diabetes mellitus without complications: Secondary | ICD-10-CM

## 2011-04-15 NOTE — Progress Notes (Signed)
Labs only

## 2011-04-18 LAB — HEMOGLOBIN A1C: Hgb A1c MFr Bld: 6.7 % — ABNORMAL HIGH (ref 4.6–6.5)

## 2011-05-02 LAB — CBC
HCT: 39.6
Hemoglobin: 13.9
WBC: 9.9

## 2011-05-02 LAB — BASIC METABOLIC PANEL
CO2: 35 — ABNORMAL HIGH
Chloride: 103
Chloride: 99
GFR calc non Af Amer: >60
Glucose, Bld: 127 — ABNORMAL HIGH
Glucose, Bld: 97
Potassium: 3.9
Potassium: 4.3
Sodium: 139
Sodium: 141

## 2011-05-02 LAB — DIFFERENTIAL
Eosinophils Relative: 2
Lymphocytes Relative: 21
Lymphs Abs: 2.1
Monocytes Absolute: 0.7

## 2011-05-07 ENCOUNTER — Other Ambulatory Visit: Payer: Self-pay | Admitting: Internal Medicine

## 2011-05-07 MED ORDER — SAXAGLIPTIN-METFORMIN ER 2.5-1000 MG PO TB24: ORAL_TABLET | ORAL | Status: DC

## 2011-05-07 NOTE — Telephone Encounter (Signed)
RX sent

## 2011-05-27 ENCOUNTER — Other Ambulatory Visit: Payer: Self-pay | Admitting: Internal Medicine

## 2011-05-27 NOTE — Telephone Encounter (Signed)
Patient needs to schedule a CPX  

## 2011-07-11 ENCOUNTER — Other Ambulatory Visit: Payer: Self-pay | Admitting: Internal Medicine

## 2011-07-11 NOTE — Telephone Encounter (Signed)
RF sent.  LM for pt to call office.  Pt needs office visit.

## 2011-07-25 ENCOUNTER — Encounter: Payer: Self-pay | Admitting: Internal Medicine

## 2011-07-25 ENCOUNTER — Ambulatory Visit (INDEPENDENT_AMBULATORY_CARE_PROVIDER_SITE_OTHER): Payer: Commercial Managed Care - PPO | Admitting: Internal Medicine

## 2011-07-25 VITALS — BP 132/74 | HR 65 | Temp 99.0°F | Resp 14 | Ht 70.75 in | Wt 308.4 lb

## 2011-07-25 DIAGNOSIS — Z0000 Encounter for general adult medical examination without abnormal findings: Secondary | ICD-10-CM

## 2011-07-25 DIAGNOSIS — Z86010 Personal history of colonic polyps: Secondary | ICD-10-CM

## 2011-07-25 DIAGNOSIS — I10 Essential (primary) hypertension: Secondary | ICD-10-CM

## 2011-07-25 DIAGNOSIS — R9431 Abnormal electrocardiogram [ECG] [EKG]: Secondary | ICD-10-CM

## 2011-07-25 DIAGNOSIS — E119 Type 2 diabetes mellitus without complications: Secondary | ICD-10-CM

## 2011-07-25 DIAGNOSIS — E785 Hyperlipidemia, unspecified: Secondary | ICD-10-CM

## 2011-07-25 NOTE — Assessment & Plan Note (Addendum)
He is not on a statin because of his past history of hepatitis B. LDL goal is less than 100, ideally less than 70 with his diabetes. Fasting hepatic panel should be checked. Low-dose, once weekly Crestor as one option  which would be associated with low risk.

## 2011-07-25 NOTE — Patient Instructions (Signed)
Preventive Health Care: Exercise at least 30-45 minutes a day,  3-4 days a week.  Health Care Power of Attorney & Living Will. Complete if not in place ; these place you in charge of your health care decisions.  Eat a low-fat diet with lots of fruits and vegetables, up to 7-9 servings per day. Consume less than 40 (preferably ZERO) grams of sugar per day from foods & drinks with High Fructose Corn Syrup (HFCS) sugar as #1,2,3 or # 4 on label.Whole Foods, [employer] & [employer] do not carry products with HFCS. Follow a  low carb nutrition program such as [place] or The New Sugar Busters  to prevent Diabetes progression . White carbohydrates (potatoes, rice, bread, and pasta) have a high spike of sugar and a high load of sugar. For example a  baked potato has a cup of sugar and a  french fry  2 teaspoons of sugar. Yams, wild  rice, whole grained bread &  wheat pasta have been much lower spike and load of  sugar. Portions should be the size of a deck of cards or your palm. Please  schedule fasting Labs : BMET,Lipids, hepatic panel,  TSH. PLEASE BRING THESE INSTRUCTIONS TO FOLLOW UP  LAB APPOINTMENT.This will guarantee correct labs are drawn, eliminating need for repeat blood sampling ( needle sticks ! ). Diagnoses /Codes: V70.0

## 2011-07-25 NOTE — Assessment & Plan Note (Signed)
A1c of 7%.

## 2011-07-25 NOTE — Progress Notes (Signed)
Subjective:    Patient ID: Mattox Schorr Wearing, male    DOB: 12/17/1950, 61 y.o.   MRN: [id number]  HPI  [name]  is here for a physical;acute issues degenerative joint changes in the knees. He is receiving Synvisc injections.      Review of Systems HYPERTENSION: Disease Monitoring: Blood pressure range-blood pressures have been recorded with a wrist cuff; these tend to be 10 points higher than those recorded in the office.  hest pain, palpitations-no       Dyspnea- only after being on stairmaster for more than 40 minutes or after climbing 2 flights of stairs. Medications: Compliance- yes Lightheadedness,Syncope- no   Edema- only after prolonged standing.  DIABETES: Disease Monitoring: Blood Sugar ranges-FBS 122-154 Polyuria/phagia/dipsia- no       Visual problems- no Hypoglycemia: Glucoses low as 80 after intensive exercise Ophth exam: 10/12, no retinopathy, early cataract on the right Foot exam:no Medications: Compliance- yes   HYPERLIPIDEMIA: Disease Monitoring: See symptoms for Hypertension Medications: Compliance- not on statin due to PMH of Hepatitis B Abd pain, bowel changes-no   Muscle aches- no        Objective:   Physical Exam Gen.:  well-nourished in appearance. Alert, appropriate and cooperative throughout exam. Head: Normocephalic without obvious abnormalities; moustache  Eyes: No corneal or conjunctival inflammation noted. Pupils equal round reactive to light and accommodation. Fundal exam is benign without hemorrhages, exudate, papilledema. Extraocular motion intact. Vision grossly normal with lenses. Ears: External  ear exam reveals no significant lesions or deformities. Canals clear .TMs normal. Hearing is grossly decreased  bilaterally. Nose: External nasal exam reveals no deformity or inflammation. Nasal mucosa are pink and moist. No lesions or exudates noted.   Mouth: Oral mucosa and oropharynx reveal no lesions or exudates. Teeth in good repair. Neck: No  deformities, masses, or tenderness noted. Range of motion & Thyroid normal. Lungs: Normal respiratory effort; chest expands symmetrically. Lungs are clear to auscultation without rales, wheezes, or increased work of breathing. Heart: Normal rate and rhythm. Normal S1 and S2. No gallop, click, or rub. Grade 1/6 systolic murmur  Abdomen: Bowel sounds normal; abdomen soft and nontender. No masses or organomegaly .Ventral hernia noted. Genitalia/ DRE: Variceal or hydrocele in the scrotum. Prostate appears to be normal signs without asymmetry, nodularity, or induration.                                                                                   Musculoskeletal/extremities: No deformity or scoliosis noted of  the thoracic or lumbar spine. No clubbing, cyanosis, edema, or deformity noted. Range of motion  Decreased @ knees; crepitus L > R .Tone & strength  normal. Nail health  good. Vascular: Carotid, radial artery, dorsalis pedis and  posterior tibial pulses are full and equal. No bruits present. Neurologic: Alert and oriented x3. Deep tendon reflexes symmetrical and normal.          Skin: Intact without suspicious lesions or rashes. Scattered keratoses Lymph: No cervical, axillary, or inguinal lymphadenopathy present. Psych: Mood and affect are normal. Normally interactive  Assessment & Plan:  #1 comprehensive physical exam; no acute findings #2 see Problem List with Assessments & Recommendations Plan: see Orders

## 2011-07-25 NOTE — Assessment & Plan Note (Signed)
Ask him to monitor the blood pressure with a cuff rather than the wrist device to optimally assess risk

## 2011-07-25 NOTE — Assessment & Plan Note (Signed)
With the questionable slight progression nonspecific ST wave changes inferiorly and laterally; I asked him to consider cardiology assessment.

## 2011-08-08 ENCOUNTER — Other Ambulatory Visit (INDEPENDENT_AMBULATORY_CARE_PROVIDER_SITE_OTHER): Payer: Commercial Managed Care - PPO

## 2011-08-08 ENCOUNTER — Telehealth: Payer: Self-pay | Admitting: Internal Medicine

## 2011-08-08 DIAGNOSIS — Z0000 Encounter for general adult medical examination without abnormal findings: Secondary | ICD-10-CM

## 2011-08-08 LAB — TSH: TSH: 0.64 u[IU]/mL (ref 0.35–5.50)

## 2011-08-08 LAB — HEPATIC FUNCTION PANEL
ALT: 46 U/L (ref 0–53)
Total Bilirubin: 0.8 mg/dL (ref 0.3–1.2)

## 2011-08-08 LAB — BASIC METABOLIC PANEL
CO2: 32 mEq/L (ref 19–32)
Glucose, Bld: 119 mg/dL — ABNORMAL HIGH (ref 70–99)
Potassium: 4.2 mEq/L (ref 3.5–5.1)
Sodium: 140 mEq/L (ref 135–145)

## 2011-08-08 LAB — LIPID PANEL
HDL: 45.6 mg/dL (ref >39.00)
Total CHOL/HDL Ratio: 4
VLDL: 25 mg/dL (ref 0.0–40.0)

## 2011-08-08 NOTE — Telephone Encounter (Signed)
Per Dr.Hopper 30 mg x 1 week, 60 mg x 2 weeks (give samples), patient to call if ok then rx to be given.  Spoke with patient, patient ok'd all information

## 2011-08-12 ENCOUNTER — Ambulatory Visit (HOSPITAL_COMMUNITY)
Admission: RE | Admit: 2011-08-12 | Discharge: 2011-08-12 | Disposition: A | Discharge status: Home / Self Care | Payer: 59 | Source: Ambulatory Visit | Attending: Internal Medicine | Admitting: Internal Medicine

## 2011-08-12 ENCOUNTER — Other Ambulatory Visit: Payer: Self-pay

## 2011-08-12 VITALS — BP 132/68 | HR 70 | Wt 308.0 lb

## 2011-08-12 DIAGNOSIS — R011 Cardiac murmur, unspecified: Secondary | ICD-10-CM | POA: Insufficient documentation

## 2011-08-12 DIAGNOSIS — R9431 Abnormal electrocardiogram [ECG] [EKG]: Secondary | ICD-10-CM | POA: Insufficient documentation

## 2011-08-12 HISTORY — DX: Obesity, unspecified: E66.9

## 2011-08-12 HISTORY — DX: Abnormal electrocardiogram (ECG) (EKG): R94.31

## 2011-08-12 NOTE — Progress Notes (Signed)
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 Denies h/o known CAD or other heart problems.  Had stress test with Lavonne Chick 20 years ago which was normal. Echo 8 years ago which was normal.   Very active. Exercises 5-6x/week for 1+ hours per day. Works out on [employer] 20-45 mins. Gets HR up to 140. No CP or undue SOB. Also works out on bike, TM and elliptical as well as [employer]. Recently has struggled with bad L knee and has made it hard to be as active and to keep his weight down.  Had ECG with Hop on 07/25/11 which showed mildly biphasic T waves inferolaterally. New from 2011 but not too different from 2010. BP 130/70. HgBA1C was 7.0  Lab Results  Component Value Date   CHOL 166 08/08/2011   HDL 45.60 08/08/2011   LDLCALC 95 08/08/2011   TRIG 125.0 08/08/2011   CHOLHDL 4 08/08/2011     Review of Systems:     Cardiac Review of Systems: {Y] = yes [ ]  = no  Chest Pain [    ]  Resting SOB [   ] Exertional SOB  [  ]  Orthopnea [  ]   Pedal Edema [   ]    Palpitations [  ] Syncope  [  ]   Presyncope [   ]  General Review of Systems: [Y] = yes [  ]=no Constitional: recent weight change [  ]; anorexia [  ]; fatigue [  ]; nausea [  ]; night sweats [  ]; fever [  ]; or chills [  ];                                                                                                                                           Eye : blurred vision [  ]; diplopia [   ]; vision changes [  ];  Amaurosis fugax[  ]; Resp: cough [  ];  wheezing[  ];  hemoptysis[  ]; shortness of breath[  ]; paroxysmal nocturnal dyspnea[  ]; dyspnea on exertion[  ]; or orthopnea[  ];  GI:  gallstones[  ], vomiting[  ];  dysphagia[  ]; melena[  ];  hematochezia [  ]; heartburn[  ];   Hx of  Colonoscopy[  ]; GU: kidney stones [  ]; hematuria[  ];   dysuria [  ];   nocturia[  ];  history of     obstruction [  ];                 Skin: rash, swelling[  ];, hair loss[  ];  peripheral edema[  ];  or itching[  ]; Musculosketetal:  myalgias[  ];  joint swelling[  ];  joint erythema[  ];  joint pain[ Y ];  back pain[  ];  Heme/Lymph: bruising[  ];  bleeding[  ];  anemia[  ];  Neuro: TIA[  ];  headaches[  ];  stroke[  ];  vertigo[  ];  seizures[  ];   paresthesias[  ];  difficulty walking[  ];  Psych:depression[  ]; anxiety[  ];  Endocrine: diabetes[  ];  thyroid dysfunction[  ];  Immunizations: Flu [  ]; Pneumococcal[  ];   Past Medical History  Diagnosis Date  . Hyperlipidemia   . Hypertension   . Diabetes mellitus   . Obesity   . Nonspecific abnormal electrocardiogram (ECG) (EKG)     Current Outpatient Prescriptions  Medication Sig Dispense Refill  . amLODipine (NORVASC) 10 MG tablet TAKE ONE TABLET BY MOUTH EVERY DAY  90 tablet  0  . hydrochlorothiazide (HYDRODIURIL) 25 MG tablet TAKE 1 TABLET BY MOUTH ONCE DAILY  90 tablet  0  . Misc Natural Products (GLUCOSAMINE CHONDROITIN ADV PO) Take by mouth daily.      . Multiple Vitamin (MULTIVITAMIN) tablet Take 1 tablet by mouth daily.      . Saxagliptin-Metformin (KOMBIGLYZE XR) 2.11-998 MG TB24 1 by mouth twice daily  60 tablet  3  . Tamsulosin HCl (FLOMAX) 0.4 MG CAPS TAKE 1 CAPSULE BY MOUTH DAILY  90 capsule  0  . valsartan (DIOVAN) 320 MG tablet Take 320 mg by mouth daily.      Marland Kitchen losartan (COZAAR) 100 MG tablet TAKE 1 TABLET BY MOUTH DAILY  90 tablet  0  . ONETOUCH DELICA LANCETS MISC TEST ONCE DAILY  100 each  3     Allergies  Allergen Reactions  . Tetracycline     REACTION: PHOTOSENSITIVITY    History   Social History  . Marital Status: Married    Spouse Name: N/A    Number of Children: N/A  . Years of Education: N/A   Occupational History  . Not on file.   Social History Main Topics  . Smoking status: Former Smoker    Quit date: 08/15/1992  . Smokeless tobacco: Not on file    Comment: 1-2 pack smoker   . Alcohol Use: 6.0 oz/week    10 Glasses of wine per week     Red Wine  . Drug Use: No  . Sexually Active: Not on file   Other Topics Concern  . Not on file   Social History Narrative  . No narrative on file    Family History  Problem Relation Age of Onset  . Cancer Mother   . Diabetes Father   . Heart disease Father   Father: died at 83. + DM2. (heavy tobacco) Mother: died at 72. Lung CA (heavy tobacco)  PHYSICAL EXAM: Filed Vitals:   08/12/11 1557  BP: 132/68  Pulse: 70   General:  Well appearing. No respiratory difficulty HEENT: normal Neck: supple. no JVD. Carotids 2+ bilat; no bruits. No lymphadenopathy or thryomegaly appreciated. Cor: PMI nondisplaced. Regular rate & rhythm. No rubs, gallops. 2/6 systolic murmur over aortic valve. S2 crisp.  Lungs: clear Abdomen: obese soft, nontender, nondistended. Good bowel sounds. Extremities: no cyanosis, clubbing, rash, edema Neuro: alert & oriented x 3, cranial nerves grossly intact. moves all 4 extremities w/o difficulty. Affect pleasant.  ECG: NSR with mild biphasic t waves laterally    ASSESSMENT & PLAN:

## 2011-08-12 NOTE — Patient Instructions (Signed)
Your physician has requested that you have an echocardiogram. Echocardiography is a painless test that uses sound waves to create images of your heart. It provides your doctor with information about the size and shape of your heart and how well your heart's chambers and valves are working. This procedure takes approximately one hour. There are no restrictions for this procedure.  Your physician has requested that you have an exercise tolerance test. For further information please visit [email or web address]. Please also follow instruction sheet, as given.   

## 2011-08-15 ENCOUNTER — Other Ambulatory Visit: Payer: Self-pay | Admitting: Internal Medicine

## 2011-08-18 ENCOUNTER — Encounter (HOSPITAL_COMMUNITY): Payer: Self-pay

## 2011-08-18 NOTE — Assessment & Plan Note (Signed)
ECG abnormalities are [place]ker and were actually present some years ago as well. Despite his weight he is extremely active and physically fit. That said, given his CRFs, I do think it is reasonable to repeat a stress test and given his bad knee we will do this in the [facility] lab on the bike.

## 2011-08-18 NOTE — Assessment & Plan Note (Signed)
I suspect he has mild aortic valve sclerosis. Will check echo to evaluate.

## 2011-08-26 ENCOUNTER — Ambulatory Visit (HOSPITAL_COMMUNITY)
Admission: RE | Admit: 2011-08-26 | Discharge: 2011-08-26 | Disposition: A | Discharge status: Home / Self Care | Payer: 59 | Source: Ambulatory Visit | Attending: Internal Medicine | Admitting: Internal Medicine

## 2011-08-26 ENCOUNTER — Telehealth: Payer: Self-pay

## 2011-08-26 ENCOUNTER — Ambulatory Visit (HOSPITAL_COMMUNITY): Payer: Commercial Managed Care - PPO

## 2011-08-26 DIAGNOSIS — R9431 Abnormal electrocardiogram [ECG] [EKG]: Secondary | ICD-10-CM | POA: Insufficient documentation

## 2011-08-26 MED ORDER — DULOXETINE HCL 60 MG PO CPEP: 60.0000 mg | ORAL_CAPSULE | Freq: Every day | ORAL | Status: DC

## 2011-08-26 NOTE — Telephone Encounter (Signed)
RX sent

## 2011-08-26 NOTE — Telephone Encounter (Signed)
Message copied by Maurice Small on Mon Aug 26, 2011  8:30 AM ------      Message from: Pecola Lawless      Created: Sun Aug 25, 2011  8:56 PM       Please send a prescription for Cymbalta 60 mg ; dispense 90,one  daily to [facility]

## 2011-08-27 ENCOUNTER — Telehealth: Payer: Self-pay | Admitting: Internal Medicine

## 2011-08-27 MED ORDER — LOSARTAN POTASSIUM 100 MG PO TABS: ORAL_TABLET | ORAL | Status: DC

## 2011-08-27 NOTE — Telephone Encounter (Signed)
Refill-losartan potassium 100mg . Take one tablet by mouth daily. Qty 90 last fill 11.12.12

## 2011-08-27 NOTE — Telephone Encounter (Signed)
RX sent

## 2011-09-03 ENCOUNTER — Ambulatory Visit (HOSPITAL_COMMUNITY)
Admission: RE | Admit: 2011-09-03 | Discharge: 2011-09-03 | Disposition: A | Discharge status: Home / Self Care | Payer: 59 | Source: Ambulatory Visit | Attending: Internal Medicine | Admitting: Internal Medicine

## 2011-09-03 DIAGNOSIS — R9431 Abnormal electrocardiogram [ECG] [EKG]: Secondary | ICD-10-CM | POA: Insufficient documentation

## 2011-09-03 DIAGNOSIS — I079 Rheumatic tricuspid valve disease, unspecified: Secondary | ICD-10-CM | POA: Insufficient documentation

## 2011-09-03 DIAGNOSIS — E119 Type 2 diabetes mellitus without complications: Secondary | ICD-10-CM | POA: Insufficient documentation

## 2011-09-03 DIAGNOSIS — I517 Cardiomegaly: Secondary | ICD-10-CM

## 2011-09-03 DIAGNOSIS — I080 Rheumatic disorders of both mitral and aortic valves: Secondary | ICD-10-CM | POA: Insufficient documentation

## 2011-09-30 ENCOUNTER — Other Ambulatory Visit: Payer: Self-pay | Admitting: Internal Medicine

## 2011-09-30 NOTE — Telephone Encounter (Signed)
A1C 250.00 

## 2011-10-23 ENCOUNTER — Telehealth: Payer: Self-pay | Admitting: Internal Medicine

## 2011-10-23 MED ORDER — TAMSULOSIN HCL 0.4 MG PO CAPS: ORAL_CAPSULE | ORAL | Status: DC

## 2011-10-23 NOTE — Telephone Encounter (Signed)
RX sent

## 2011-10-23 NOTE — Telephone Encounter (Signed)
Refill: Tamulosin hcl .4 mg cap. Take 1 capsule by mouth daily. Last fill 07-11-11. Qty 90

## 2011-11-18 ENCOUNTER — Other Ambulatory Visit: Payer: Self-pay | Admitting: Internal Medicine

## 2011-11-29 ENCOUNTER — Encounter: Payer: Self-pay | Admitting: Internal Medicine

## 2011-12-27 NOTE — Addendum Note (Signed)
Addended byMarga Melnick F on: 12/27/2011 10:11 AM   Modules accepted: Orders

## 2012-01-03 ENCOUNTER — Other Ambulatory Visit: Payer: Self-pay | Admitting: Internal Medicine

## 2012-01-03 DIAGNOSIS — E119 Type 2 diabetes mellitus without complications: Secondary | ICD-10-CM

## 2012-01-03 MED ORDER — SAXAGLIPTIN-METFORMIN ER 2.5-1000 MG PO TB24: ORAL_TABLET | ORAL | Status: DC

## 2012-01-03 NOTE — Telephone Encounter (Signed)
RX sent,

## 2012-01-03 NOTE — Telephone Encounter (Signed)
refill kombiglyze xr 2.5/1000mg  #180, take one tablet by mouth twice daily, last fill 3.18.13, last ov 1.10.13

## 2012-02-10 ENCOUNTER — Other Ambulatory Visit: Payer: Self-pay | Admitting: Internal Medicine

## 2012-02-10 ENCOUNTER — Other Ambulatory Visit (INDEPENDENT_AMBULATORY_CARE_PROVIDER_SITE_OTHER): Payer: 59

## 2012-02-10 DIAGNOSIS — E119 Type 2 diabetes mellitus without complications: Secondary | ICD-10-CM

## 2012-02-10 LAB — HEMOGLOBIN A1C: Hgb A1c MFr Bld: 6.4 % (ref 4.6–6.5)

## 2012-02-10 NOTE — Telephone Encounter (Signed)
Pt called stating he would really appreciate it if we could get his medication called in today. He states he is completely out.

## 2012-03-23 ENCOUNTER — Other Ambulatory Visit: Payer: Self-pay | Admitting: Internal Medicine

## 2012-03-23 MED ORDER — SAXAGLIPTIN-METFORMIN ER 2.5-1000 MG PO TB24: ORAL_TABLET | ORAL | Status: DC

## 2012-03-23 NOTE — Telephone Encounter (Signed)
refill kombiglyze xr 2.5/1000mg  #90 take one tablet by mouth daily, last fill date not listed, requesting 90-day supply CHANGE NOTE says Patient states his dose has increased, please send in new script to reflect the change  Last ov 1.10.13 V70

## 2012-03-24 ENCOUNTER — Encounter: Payer: Self-pay | Admitting: Internal Medicine

## 2012-04-06 ENCOUNTER — Encounter: Payer: Self-pay | Admitting: Internal Medicine

## 2012-05-18 ENCOUNTER — Other Ambulatory Visit: Payer: Self-pay | Admitting: Internal Medicine

## 2012-06-22 ENCOUNTER — Other Ambulatory Visit: Payer: Self-pay | Admitting: Internal Medicine

## 2012-06-22 NOTE — Telephone Encounter (Signed)
A1C 250.00 

## 2012-07-09 ENCOUNTER — Encounter: Payer: Self-pay | Admitting: Internal Medicine

## 2012-07-09 ENCOUNTER — Other Ambulatory Visit: Payer: Self-pay

## 2012-07-09 MED ORDER — SAXAGLIPTIN-METFORMIN ER 2.5-1000 MG PO TB24: ORAL_TABLET | ORAL | Status: DC

## 2012-07-09 NOTE — Telephone Encounter (Signed)
Hopp please advise if ok to change instruction on rx (patient increased on his own)

## 2012-07-09 NOTE — Telephone Encounter (Signed)
-----   Message sent from Pecola Lawless, MD to Rodney Hayes at 07/09/2012 5:19 PM ----- Chrae please order this bid for 3 mos

## 2012-07-11 ENCOUNTER — Encounter: Payer: Self-pay | Admitting: Internal Medicine

## 2012-07-13 ENCOUNTER — Other Ambulatory Visit: Payer: Self-pay | Admitting: *Deleted

## 2012-07-13 DIAGNOSIS — E119 Type 2 diabetes mellitus without complications: Secondary | ICD-10-CM

## 2012-07-13 MED ORDER — SAXAGLIPTIN-METFORMIN ER 2.5-1000 MG PO TB24: ORAL_TABLET | ORAL | Status: DC

## 2012-07-13 NOTE — Telephone Encounter (Signed)
Refill for Kombglyde #180 sent to pharmacy.

## 2012-07-28 ENCOUNTER — Ambulatory Visit (INDEPENDENT_AMBULATORY_CARE_PROVIDER_SITE_OTHER): Payer: 59 | Admitting: Internal Medicine

## 2012-07-28 ENCOUNTER — Encounter: Payer: Self-pay | Admitting: Internal Medicine

## 2012-07-28 VITALS — BP 148/80 | HR 70 | Temp 98.3°F | Resp 14 | Ht 71.0 in | Wt 302.0 lb

## 2012-07-28 DIAGNOSIS — Z0000 Encounter for general adult medical examination without abnormal findings: Secondary | ICD-10-CM

## 2012-07-28 DIAGNOSIS — IMO0001 Reserved for inherently not codable concepts without codable children: Secondary | ICD-10-CM

## 2012-07-28 LAB — BASIC METABOLIC PANEL
Chloride: 96 mEq/L (ref 96–112)
Creatinine, Ser: 0.9 mg/dL (ref 0.4–1.5)
GFR: 88.07 mL/min (ref >60.00)
Potassium: 3.6 mEq/L (ref 3.5–5.1)

## 2012-07-28 LAB — MICROALBUMIN / CREATININE URINE RATIO: Microalb, Ur: 8.7 mg/dL — ABNORMAL HIGH (ref 0.0–1.9)

## 2012-07-28 LAB — HEPATIC FUNCTION PANEL
AST: 19 U/L (ref 0–37)
Albumin: 4.3 g/dL (ref 3.5–5.2)

## 2012-07-28 LAB — CBC WITH DIFFERENTIAL/PLATELET
Basophils Absolute: 0.1 10*3/uL (ref 0.0–0.1)
Eosinophils Absolute: 0.1 10*3/uL (ref 0.0–0.7)
HCT: 40.4 % (ref 39.0–52.0)
Lymphs Abs: 1.9 10*3/uL (ref 0.7–4.0)
MCHC: 34.5 g/dL (ref 30.0–36.0)
Monocytes Absolute: 0.7 10*3/uL (ref 0.1–1.0)
Monocytes Relative: 7.6 % (ref 3.0–12.0)
Platelets: 335 10*3/uL (ref 150.0–400.0)
RDW: 12.6 % (ref 11.5–14.6)

## 2012-07-28 LAB — HEMOGLOBIN A1C: Hgb A1c MFr Bld: 8.8 % — ABNORMAL HIGH (ref 4.6–6.5)

## 2012-07-28 LAB — LIPID PANEL
Cholesterol: 164 mg/dL (ref 0–200)
VLDL: 74.4 mg/dL — ABNORMAL HIGH (ref 0.0–40.0)

## 2012-07-28 LAB — TSH: TSH: 0.52 u[IU]/mL (ref 0.35–5.50)

## 2012-07-28 MED ORDER — INSULIN PEN NEEDLE 32G X 6 MM MISC: Status: DC

## 2012-07-28 MED ORDER — LIRAGLUTIDE 18 MG/3ML ~~LOC~~ SOLN: 1.8000 mg | Freq: Every day | SUBCUTANEOUS | Status: DC

## 2012-07-28 NOTE — Progress Notes (Signed)
Subjective:    Patient ID: Rodney Hayes, male    DOB: 1951/02/13, 62 y.o.   MRN: [id number]  HPI  Rodney Hayes is here for a physical;  acute issues include recent abscessed tooth extraction by Dr Manson Passey.      Review of Systems He is not  on a heart healthy diet; he is not exercising due to joint issues. He denies chest pain, palpitations, dyspnea, or claudication. Family history is negative for premature coronary disease. In context of Diabetes his LDL goal was less than 100, ideally < 70.   HYPERTENSION: Disease Monitoring: Blood pressure range-128-148/68-88 Medications: Compliance- yes  Lightheadedness,Syncope- no   Edema- occasionally  DM:  FBS range- 142-300 2hr post prandial glucose < 300 Polyuria/phagia/dipsia- no      Visual problems- no  HYPERLIPIDEMIA: Disease Monitoring: See symptoms for Hypertension Medications: Compliance- no statin , PMH of hepatitis Abd pain, bowel changes- stools loose on antibiotics for dental infection   Muscle aches- no but  L knee & R ankle pain     Objective:   Physical Exam Gen.:  well-nourished in appearance. Alert, appropriate and cooperative throughout exam. Appears younger than stated age  Head: Normocephalic without obvious abnormalities;moustache.  No alopecia  Eyes: No corneal or conjunctival inflammation noted. Pupils equal round reactive to light and accommodation. Fundal exam is benign without hemorrhages, exudate, papilledema. Extraocular motion intact. Vision grossly normal with lenses. Ears: External  ear exam reveals no significant lesions or deformities. Canals clear .TMs normal. Hearing is grossly normal bilaterally. Nose: External nasal exam reveals no deformity or inflammation. Nasal mucosa are pink and moist. No lesions or exudates noted.   Mouth: Oral mucosa and oropharynx reveal no lesions or exudates. Teeth in good repair. Neck: No deformities, masses, or tenderness noted. Range of motion normal. Thyroid  substernal. Lungs: Normal respiratory effort; chest expands symmetrically. Lungs are clear to auscultation without rales, wheezes, or increased work of breathing. Heart: Normal rate and rhythm. Normal S1 and S2. No gallop, click, or rub. Grade 1/6 systolic murmur R base. Abdomen: Bowel sounds normal; abdomen soft and nontender. No masses, organomegaly or hernias noted. Genitalia: Genitalia normal except for left varices. Prostate is normal without enlargement, asymmetry, nodularity, or induration.                                    Musculoskeletal/extremities: No deformity or scoliosis noted of  the thoracic or lumbar spine. No clubbing, cyanosis, edema, or significant extremity  deformity noted. Range of motion markedly decreased L knee; marked crepitus R knee  .Tone & strength  normal.Joints normal . Nail health good. Able to lie down & sit up w/o help. Negative SLR bilaterally Vascular: Carotid, radial artery, dorsalis pedis and  posterior tibial pulses are full and equal. No bruits present. Neurologic: Alert and oriented x3. Deep tendon reflexes symmetrical and normal.  Skin: Intact without suspicious lesions or rashes. Lymph: No cervical, axillary, or inguinal lymphadenopathy present. Psych: Mood and affect are normal. Normally interactive  Assessment & Plan:  #1 comprehensive physical exam; no acute findings Plan: see Orders

## 2012-07-28 NOTE — Patient Instructions (Addendum)
Preventive Health Care: Eat a low-fat diet with lots of fruits and vegetables, up to 7-9 servings per day.  Consume less than 40 grams of sugar per day from foods & drinks with High Fructose Corn Sugar as #1,2,3 or # 4 on label. Minimal Blood Pressure Goal= AVERAGE < 140/90;  Ideal is an AVERAGE < 135/85. This AVERAGE should be calculated from @ least 5-7 BP readings taken @ different times of day on different days of week. You should not respond to isolated BP readings , but rather the AVERAGE for that week .

## 2012-07-28 NOTE — Addendum Note (Signed)
Addended by: Maurice Small on: 07/28/2012 02:24 PM   Modules accepted: Orders

## 2012-07-30 ENCOUNTER — Other Ambulatory Visit: Payer: Self-pay | Admitting: Internal Medicine

## 2012-07-30 ENCOUNTER — Encounter: Payer: Self-pay | Admitting: Internal Medicine

## 2012-08-03 ENCOUNTER — Other Ambulatory Visit: Payer: Self-pay | Admitting: Internal Medicine

## 2012-08-17 ENCOUNTER — Other Ambulatory Visit: Payer: Self-pay | Admitting: Internal Medicine

## 2012-09-01 ENCOUNTER — Encounter: Payer: Self-pay | Admitting: Internal Medicine

## 2012-09-08 ENCOUNTER — Ambulatory Visit (INDEPENDENT_AMBULATORY_CARE_PROVIDER_SITE_OTHER): Payer: Self-pay | Admitting: Family Medicine

## 2012-09-08 VITALS — Wt 298.0 lb

## 2012-09-08 DIAGNOSIS — E119 Type 2 diabetes mellitus without complications: Secondary | ICD-10-CM

## 2012-09-09 NOTE — Progress Notes (Signed)
Patient presents today for 3 month DM follow-up through the employer-sponsored [employer] to Wellness program. Current DM regimen includes Kombiglyze. Patient last saw Dr. Alwyn Ren in January. At this time MD started Victoza due to increased A1c of 8.8. Patient had recently suffered an abcessed tooth which greatly affected glucose. This has since been treated and glucose and A1c are normalizing.   Diabetes Assessment: Blood glucose testing - once daily; Highest CBG 154; Lowest CBG 114; A1c - 7.6; hypoglycemia frequency - Rare  Other Diabetes History: At last appt patient's DM regimen included Kombiglyze XR 2.11/998 mg twice daily. At recent follow-up with Dr. Alwyn Ren, Victoza was added and patient is now up to 1.8 mg daily. Addition of Victoza was a result of increased A1c (up to 8.8) due to recent tooth abcess. Patient reports experiencing elevated glucose readings during time of infection. Glucose has started to normalize after addition of Victoza. Pt denies adverse effects including GI intolerance, and is experiencing favorable results thus far. Due to decreasing glucose readings, patient has recently decreased Kombiglyze to once daily. This was a result of a hypoglycemic episode the pateint experience during work hours. While performing a procedure, he began to experience sweating and blurry vision. He stepped away, corrected with crackers, and symptoms resolved completely. Thus far, hypoglycemia has been infrequent.   Lifestyle Factors: Diet - Patient has made efforts at an improved diet. No longer eating icecream,"scream", still having mild cravings for sweets, eating dark chocolate to satisfy. Still attempting to make healthy choices, but continues struggling with portion sizes. Pt reports he does not eat second and third helpings but simply has serving sizes that are too large. Addition of victoza has increased satiety and seems to be helping aid pts dietary changes.  Exercise - Continues exercising  regularly, 45-60 min at a time. Exercises include strength trainins, cardio, core strengthening, and most recently pilates for improved flexibility. Patient always has great control of exercise regimen.   Assessment: Patient has had some difficulty controlling glucose due to recent abcessed tooth; however, this has since been corrected and pt is gaining better control of glucose. A1c is trending down appropriately. A1c of 8.8 ~6 weeks ago and is 7.6 today, after start of Victoza in addition to current Kombiglyze. Patient has made positive changes to diet and maintains excellent exercise regimen. He will follow-up in 3 months.  Plan: 1) Continue exercising regularly, great job maintaining this! 2) Continue making healthy meal choices and limiting portion sizes. Attempt to choose smaller serving sizes.  3) Continue testing regularly 4) Return for follow-up in 3 months on Tuesday, May 27th @ 3:30 pm

## 2012-09-22 NOTE — Progress Notes (Signed)
ATTENDING PHYSICIAN NOTE: I have reviewed the chart and agree with the plan as detailed above. Sara Neal MD Pager [phone]  

## 2012-09-28 ENCOUNTER — Other Ambulatory Visit: Payer: Self-pay | Admitting: Internal Medicine

## 2012-10-13 ENCOUNTER — Telehealth: Payer: Self-pay | Admitting: Internal Medicine

## 2012-10-13 MED ORDER — GLUCOSE BLOOD VI STRP: ORAL_STRIP | Status: DC

## 2012-10-13 NOTE — Telephone Encounter (Signed)
One touch ultra Test strips  Qty:100 Test Blood glucose once daily Last fill: 1.20.14

## 2012-10-13 NOTE — Telephone Encounter (Signed)
RX sent electronically 

## 2012-11-16 ENCOUNTER — Other Ambulatory Visit: Payer: Self-pay | Admitting: Internal Medicine

## 2012-11-16 DIAGNOSIS — E785 Hyperlipidemia, unspecified: Secondary | ICD-10-CM

## 2012-11-16 DIAGNOSIS — E119 Type 2 diabetes mellitus without complications: Secondary | ICD-10-CM

## 2012-11-16 NOTE — Telephone Encounter (Signed)
Pt called to schedule an OV and repeat lipids and A1c. Labs ordered.

## 2012-12-08 ENCOUNTER — Ambulatory Visit (INDEPENDENT_AMBULATORY_CARE_PROVIDER_SITE_OTHER): Payer: Self-pay | Admitting: Family Medicine

## 2012-12-08 VITALS — Wt 298.0 lb

## 2012-12-08 DIAGNOSIS — E119 Type 2 diabetes mellitus without complications: Secondary | ICD-10-CM

## 2012-12-08 NOTE — Progress Notes (Signed)
Patient presents today for 3 month diabetes follow-up as part of the employer-sponsored [employer] to Wellness program. Current diabetes regimen includes Kombiglyze and Victoza. Patient also continues on daily ARB. Pt plans to schedule an MD follow-up soon. No med changes at this time. Patient has a TKR planned for late June.   Diabetes Assessment: Type of Diabetes: Type 2; MD managing Diabetes Hopper; checks blood glucose once daily; takes medications as prescribed; checks feet daily; hypoglycemia frequency Rare; does not take an aspirin a day; Highest CBG 188; Lowest CBG 120 Other Diabetes History: Current med regimen includes kombiglyze XR 2.11/998 mg daily and victoza 1.8 mg daily. Patient does maintain good medication compliance. Patient did not bring meter today but did bring glucose log and is currently testing 1 time per day, fasting. Glucose monitoring occurs fasting and when symptomatic. Hypoglycemia frequency is rare. Fasting glucose ranges from 120-130s. Patient does demonstrate appropriate correction of hypoglycemia. Patient denies signs and symptoms of neuropathy including numbness/tingling/burning and symptoms of foot infection. Patient is due for yearly eye exam and will make this appt soon. Patient plans to have an appt with PCP within the next month in preparation for TKR, at this time MD will do A1c.   Lifestyle Factors: Diet - No major changes to diet at this time. Patient is aware he needs to work on improving diet, but admits this is most difficult for him. Patient continues attempting to make healthy choices, but struggles with portion control and limiting sweets. Addition of victoza increased satiety in the beginning but patient reports this effect was not lasting. He also just returned from [place] to [place] and admits to eating whatever during their vacation.  Exercise - Continues exercising regularly, 45-60 min at a time. Exercises include strength training, cardio,  core strengthening, and most recently pilates. Patient always has great control of exercise regimen and reports pilates have greatly improved his flexibility.   Assessment: Patient returns today under good diabetic control. Did not draw A1c as MD will do this next month. Fasting glucose appears improved but remains slightly elevated. Patient continues doing a great job maintaining exercise regimen; however, he continues to admit his weakness regarding dietary control. Patient has a total knee replacement coming up in ~1 month and is somewhat nervous about this. However, he is motivated by this upcoming surgery and has comitted to maintaining exercise and improving diet in an effort to speed recovery process. .  Plan: 1) Continue to exercise regularly and continue with pilates in preparation for knee replacement 2) Continue to make healthy dietary choices and continue to watch portion size 3) Continue testing regularly 4) Return for follow-up in 3 months on Wednesday August 27th @ 3:30 pm

## 2012-12-10 ENCOUNTER — Other Ambulatory Visit (INDEPENDENT_AMBULATORY_CARE_PROVIDER_SITE_OTHER): Payer: 59

## 2012-12-10 DIAGNOSIS — E119 Type 2 diabetes mellitus without complications: Secondary | ICD-10-CM

## 2012-12-10 DIAGNOSIS — E785 Hyperlipidemia, unspecified: Secondary | ICD-10-CM

## 2012-12-10 LAB — LIPID PANEL
HDL: 43.9 mg/dL (ref >39.00)
Total CHOL/HDL Ratio: 4
VLDL: 25 mg/dL (ref 0.0–40.0)

## 2012-12-10 LAB — HEMOGLOBIN A1C: Hgb A1c MFr Bld: 6.3 % (ref 4.6–6.5)

## 2012-12-14 NOTE — Progress Notes (Deleted)
Patient ID: Rodney Hayes, male   DOB: 08/29/1950, 62 y.o.   MRN: [id number]

## 2012-12-28 NOTE — Progress Notes (Signed)
Need orders in EPIC.  Surgery scheduled for 01/11/13.  Thank You.  

## 2012-12-30 ENCOUNTER — Other Ambulatory Visit: Payer: Self-pay | Admitting: Orthopedic Surgery

## 2012-12-30 MED ORDER — BUPIVACAINE LIPOSOME 1.3 % IJ SUSP: 20.0000 mL | Freq: Once | INTRAMUSCULAR | Status: DC

## 2012-12-30 MED ORDER — DEXAMETHASONE SODIUM PHOSPHATE 10 MG/ML IJ SOLN: 10.0000 mg | Freq: Once | INTRAMUSCULAR | Status: DC

## 2012-12-30 NOTE — Progress Notes (Signed)
Preoperative surgical orders have been place into the Epic hospital system for Rodney Hayes on 12/30/2012, 6:03 PM  by Patrica Duel for surgery on 01/11/2013.  Preop Total Knee orders including Experal, IV Tylenol, and IV Decadron as long as there are no contraindications to the above medications. Avel Peace, PA-C

## 2012-12-30 NOTE — Progress Notes (Signed)
Need orders in EPIC.  Surgery scheduled for 01/11/13.  Preop on 01/06/13 at 1430.  Thank You.

## 2013-01-04 NOTE — Progress Notes (Signed)
Patient ID: Rodney Hayes, male   DOB: 12/02/1950, 62 y.o.   MRN: [id number] ATTENDING PHYSICIAN NOTE: I have reviewed the chart and agree with the plan as detailed above. Evann Erazo MD Pager [phone]  

## 2013-01-05 ENCOUNTER — Encounter (HOSPITAL_COMMUNITY): Payer: Self-pay | Admitting: Pharmacy Technician

## 2013-01-06 ENCOUNTER — Ambulatory Visit (HOSPITAL_COMMUNITY)
Admission: RE | Admit: 2013-01-06 | Discharge: 2013-01-06 | Disposition: A | Discharge status: Home / Self Care | Payer: 59 | Source: Ambulatory Visit | Attending: Orthopedic Surgery | Admitting: Orthopedic Surgery

## 2013-01-06 ENCOUNTER — Encounter (HOSPITAL_COMMUNITY): Payer: Self-pay

## 2013-01-06 ENCOUNTER — Encounter (HOSPITAL_COMMUNITY)
Admission: RE | Admit: 2013-01-06 | Discharge: 2013-01-06 | Disposition: A | Discharge status: Home / Self Care | Payer: 59 | Source: Ambulatory Visit | Attending: Orthopedic Surgery | Admitting: Orthopedic Surgery

## 2013-01-06 DIAGNOSIS — Z01810 Encounter for preprocedural cardiovascular examination: Secondary | ICD-10-CM | POA: Insufficient documentation

## 2013-01-06 DIAGNOSIS — I10 Essential (primary) hypertension: Secondary | ICD-10-CM | POA: Insufficient documentation

## 2013-01-06 DIAGNOSIS — Z01818 Encounter for other preprocedural examination: Secondary | ICD-10-CM | POA: Insufficient documentation

## 2013-01-06 DIAGNOSIS — E119 Type 2 diabetes mellitus without complications: Secondary | ICD-10-CM | POA: Insufficient documentation

## 2013-01-06 HISTORY — DX: Benign prostatic hyperplasia without lower urinary tract symptoms: N40.0

## 2013-01-06 HISTORY — DX: Unspecified osteoarthritis, unspecified site: M19.90

## 2013-01-06 HISTORY — DX: Sleep apnea, unspecified: G47.30

## 2013-01-06 HISTORY — DX: Unspecified asthma, uncomplicated: J45.909

## 2013-01-06 LAB — URINALYSIS, ROUTINE W REFLEX MICROSCOPIC
Leukocytes, UA: NEGATIVE
Nitrite: NEGATIVE
Protein, ur: NEGATIVE mg/dL
Urobilinogen, UA: 0.2 mg/dL (ref 0.0–1.0)

## 2013-01-06 LAB — COMPREHENSIVE METABOLIC PANEL
Albumin: 4.1 g/dL (ref 3.5–5.2)
BUN: 12 mg/dL (ref 6–23)
Calcium: 9.7 mg/dL (ref 8.4–10.5)
GFR calc Af Amer: >90 mL/min (ref >90)
Glucose, Bld: 108 mg/dL — ABNORMAL HIGH (ref 70–99)
Sodium: 140 mEq/L (ref 135–145)
Total Protein: 7.8 g/dL (ref 6.0–8.3)

## 2013-01-06 LAB — CBC
HCT: 41.1 % (ref 39.0–52.0)
Hemoglobin: 14 g/dL (ref 13.0–17.0)
MCH: 30.8 pg (ref 26.0–34.0)
MCHC: 34.1 g/dL (ref 30.0–36.0)
RDW: 12.6 % (ref 11.5–15.5)

## 2013-01-06 LAB — SURGICAL PCR SCREEN: Staphylococcus aureus: NEGATIVE

## 2013-01-06 LAB — PROTIME-INR: Prothrombin Time: 12.1 seconds (ref 11.6–15.2)

## 2013-01-06 NOTE — Patient Instructions (Addendum)
Rodney Hayes  01/06/2013                           YOUR PROCEDURE IS SCHEDULED ON: 01/11/13               PLEASE REPORT TO SHORT STAY CENTER AT : 5:15 AM               CALL THIS NUMBER IF ANY PROBLEMS THE DAY OF SURGERY :               [phone]                      REMEMBER:   Do not eat food or drink liquids AFTER MIDNIGHT   Take these medicines the morning of surgery with A SIP OF WATER: AMLODIPINE / FLOMAX   Do not wear jewelry, make-up   Do not wear lotions, powders, or perfumes.   Do not shave legs or underarms 12 hrs. before surgery (men may shave face)  Do not bring valuables to the hospital.  Contacts, dentures or bridgework may not be worn into surgery.  Leave suitcase in the car. After surgery it may be brought to your room.  For patients admitted to the hospital more than one night, checkout time is 11:00                          The day of discharge.   Patients discharged the day of surgery will not be allowed to drive home                             If going home same day of surgery, must have someone stay with you first                           24 hrs at home and arrange for some one to drive you home from hospital.    Special Instructions:   Please read over the following fact sheets that you were given:               1. MRSA  INFORMATION                      2. [facility] PREPARING FOR SURGERY SHEET               3. INCENTIVE SPIROMETER                                                X_____________________________________________________________________        Failure to follow these instructions may result in cancellation of your surgery

## 2013-01-09 NOTE — Anesthesia Preprocedure Evaluation (Addendum)
Anesthesia Evaluation  Patient identified by MRN, date of birth, ID band Patient awake    Reviewed: Allergy & Precautions, H&P , NPO status , Patient's Chart, lab work & pertinent test results  Airway Mallampati: III TM Distance: >3 FB Neck ROM: full    Dental no notable dental hx. (+) Teeth Intact and Dental Advisory Given   Pulmonary sleep apnea ,  breath sounds clear to auscultation  Pulmonary exam normal       Cardiovascular hypertension, Pt. on medications Rhythm:regular Rate:Normal     Neuro/Psych negative neurological ROS  negative psych ROS   GI/Hepatic negative GI ROS, (+) Hepatitis -, B  Endo/Other  negative endocrine ROSdiabetes, Well Controlled, Type 2, Oral Hypoglycemic AgentsMorbid obesity  Renal/GU negative Renal ROS  negative genitourinary   Musculoskeletal   Abdominal (+) + obese,   Peds  Hematology negative hematology ROS (+)   Anesthesia Other Findings   Reproductive/Obstetrics negative OB ROS                          Anesthesia Physical Anesthesia Plan  ASA: III  Anesthesia Plan: General   Post-op Pain Management:    Induction: Intravenous  Airway Management Planned: Oral ETT  Additional Equipment:   Intra-op Plan:   Post-operative Plan: Extubation in OR  Informed Consent: I have reviewed the patients History and Physical, chart, labs and discussed the procedure including the risks, benefits and alternatives for the proposed anesthesia with the patient or authorized representative who has indicated his/her understanding and acceptance.   Dental Advisory Given  Plan Discussed with: CRNA and Surgeon  Anesthesia Plan Comments:        Anesthesia Quick Evaluation

## 2013-01-10 ENCOUNTER — Other Ambulatory Visit: Payer: Self-pay | Admitting: Surgical

## 2013-01-10 NOTE — H&P (Signed)
TOTAL KNEE ADMISSION H&P  Patient is being admitted for left total knee arthroplasty.  Subjective:  Chief Complaint:left knee pain.  HPI: Skip Litke Verrastro, 62 y.o. male, has a history of pain and functional disability in the left knee due to arthritis and has failed non-surgical conservative treatments for greater than 12 weeks to includecorticosteriod injections, viscosupplementation injections, flexibility and strengthening excercises, weight reduction as appropriate and activity modification.  Onset of symptoms was gradual, starting 8 years ago with gradually worsening course since that time. The patient noted prior procedures on the knee to include  arthroscopy and menisectomy on the left knee(s).  Patient currently rates pain in the left knee(s) at 7 out of 10 with activity. Patient has night pain, worsening of pain with activity and weight bearing, pain that interferes with activities of daily living, crepitus and joint swelling.  Patient has evidence of periarticular osteophytes and joint space narrowing by imaging studies. There is no active infection.  Patient Active Problem List   Diagnosis Date Noted  . Murmur 08/18/2011  . DIABETES MELLITUS, CONTROLLED 05/30/2010  . NONSPECIFIC ABNORMAL ELECTROCARDIOGRAM 05/30/2010  . COLONIC POLYPS, HX OF 05/30/2010  . HYPERLIPIDEMIA 05/10/2009  . SLEEP APNEA, OBSTRUCTIVE 05/10/2009  . HYPERTENSION 05/10/2009  . ALLERGIC RHINITIS 05/10/2009  . ASTHMA 05/10/2009  . HEPATITIS B, HX OF 05/10/2009   Past Medical History  Diagnosis Date  . Hyperlipidemia   . Hypertension   . Diabetes mellitus   . Obesity   . Nonspecific abnormal electrocardiogram (ECG) (EKG)   . Asthma     AS CHILD  . Arthritis   . DJD (degenerative joint disease)   . BPH (benign prostatic hyperplasia)   . Sleep apnea     USES C-PAP    Past Surgical History  Procedure Laterality Date  . Tonsillectomy and adenoidectomy    . Umbilical hernia repair    . Left knee  arthroscopy    . Biceps tendon reinsertion    . Colonoscopy with polypectomy      adenomatous; Dr Leone Payor     Current outpatient prescriptions: amLODipine (NORVASC) 10 MG tablet, Take 10 mg by mouth every morning., Disp: , Rfl: ;  Diclofenac Sodium (PENNSAID) 1.5 % SOLN, Place 40 drops onto the skin every morning. Applies to knee., Disp: , Rfl: ;  glucose blood (ONE TOUCH ULTRA TEST) test strip, TEST BLOOD GLUCOSE ONCE DAILY, Disp: 100 each, Rfl: 3;  hydrochlorothiazide (HYDRODIURIL) 25 MG tablet, Take 25 mg by mouth every morning., Disp: , Rfl:  Insulin Pen Needle (NOVOFINE) 32G X 6 MM MISC, One injection daily as directed, DX 250.00, Disp: 100 each, Rfl: 3;  Liraglutide (VICTOZA) 18 MG/3ML SOPN, Inject 1.8 mg into the skin every morning., Disp: , Rfl: ;  losartan (COZAAR) 100 MG tablet, Take 100 mg by mouth every morning., Disp: , Rfl: ;  Misc Natural Products (GLUCOSAMINE CHONDROITIN ADV PO), Take 1 tablet by mouth every morning. , Disp: , Rfl:  naproxen sodium (ALEVE) 220 MG tablet, Take 220-440 mg by mouth every morning., Disp: , Rfl: ;  ONETOUCH DELICA LANCETS 33G MISC, TEST ONCE DAILY, Disp: 100 each, Rfl: 3;  Saxagliptin-Metformin 2.11-998 MG TB24, Take 1 tablet by mouth 2 (two) times daily., Disp: , Rfl: ;  tamsulosin (FLOMAX) 0.4 MG CAPS, Take 0.4 mg by mouth every morning., Disp: , Rfl:   Allergies  Allergen Reactions  . Cymbalta (Duloxetine Hcl)     As per e-mail 12/13/11: Insomnia, lethargy, inability to ejaculate  . Tetracycline  REACTION: PHOTOSENSITIVITY    History  Substance Use Topics  . Smoking status: Former Smoker    Quit date: 08/15/1992  . Smokeless tobacco: Not on file     Comment: smoked 14 -42 up to 2 ppd  . Alcohol Use: 6.0 oz/week    10 Glasses of wine per week     Comment: Red Wine    Family History  Problem Relation Age of Onset  . Lung cancer Mother   . Diabetes Father   . Coronary artery disease Father     CBAG  . Stroke Neg Hx      Review of  Systems  Constitutional: Negative.   HENT: Negative.  Negative for neck pain.   Eyes: Negative.   Respiratory: Negative.   Cardiovascular: Negative.   Gastrointestinal: Negative.   Genitourinary: Negative.   Musculoskeletal: Positive for joint pain. Negative for myalgias, back pain and falls.       Left knee pain  Skin: Negative.   Neurological: Negative.   Endo/Heme/Allergies: Negative.   Psychiatric/Behavioral: Negative.     Objective:  Physical Exam  Constitutional: He is oriented to person, place, and time. He appears well-developed and well-nourished. No distress.  HENT:  Head: Normocephalic and atraumatic.  Right Ear: External ear normal.  Left Ear: External ear normal.  Nose: Nose normal.  Mouth/Throat: Oropharynx is clear and moist.  Eyes: EOM are normal.  Neck: Normal range of motion. Neck supple.  Cardiovascular: Normal rate, regular rhythm and intact distal pulses.   Murmur heard.  Systolic murmur is present with a grade of 1/6  Respiratory: Effort normal and breath sounds normal. No respiratory distress. He has no wheezes.  GI: Soft. Bowel sounds are normal. He exhibits no distension. There is no tenderness.  Musculoskeletal:       Right hip: Normal.       Left hip: Normal.       Right knee: He exhibits decreased range of motion and swelling. He exhibits no effusion and no erythema. No tenderness found.       Left knee: He exhibits decreased range of motion and swelling. He exhibits no effusion and no erythema. Tenderness found. Medial joint line tenderness noted. No lateral joint line tenderness noted.       Right lower leg: He exhibits no tenderness and no swelling.       Left lower leg: He exhibits no tenderness and no swelling.  Left knee: He has no effusion. He has a small Baker's cyst. He lacks 5 degrees from full extension, further flexion to 95 degrees. Good stability. No ligamentous laxity  Neurological: He is alert and oriented to person, place, and  time. He has normal strength and normal reflexes. No sensory deficit.  Skin: He is not diaphoretic.  Psychiatric: He has a normal mood and affect. His behavior is normal.    Vitals Pulse: 76 (Regular) BP: 144/82 (Sitting, Left Arm, Standard)   Estimated body mass index is 41.58 kg/(m^2) as calculated from the following:   Height as of 07/28/12: 5\' 11"  (1.803 m).   Weight as of 09/08/12: 135.172 kg (298 lb).   Imaging Review Plain radiographs demonstrate severe degenerative joint disease of the left knee(s). The overall alignment ismild varus. The bone quality appears to be good for age and reported activity level.  Assessment/Plan:  End stage arthritis, left knee   The patient history, physical examination, clinical judgment of the provider and imaging studies are consistent with end stage degenerative joint disease of  the left knee(s) and total knee arthroplasty is deemed medically necessary. The treatment options including medical management, injection therapy arthroscopy and arthroplasty were discussed at length. The risks and benefits of total knee arthroplasty were presented and reviewed. The risks due to aseptic loosening, infection, stiffness, patella tracking problems, thromboembolic complications and other imponderables were discussed. The patient acknowledged the explanation, agreed to proceed with the plan and consent was signed. Patient is being admitted for inpatient treatment for surgery, pain control, PT, OT, prophylactic antibiotics, VTE prophylaxis, progressive ambulation and ADL's and discharge planning. The patient is planning to be discharged home with home health services    [place], [place]

## 2013-01-11 ENCOUNTER — Inpatient Hospital Stay (HOSPITAL_COMMUNITY)
Admission: RE | Admit: 2013-01-11 | Discharge: 2013-01-13 | DRG: 470 | Disposition: A | Discharge status: Home Health | Payer: 59 | Source: Ambulatory Visit | Attending: Orthopedic Surgery | Admitting: Orthopedic Surgery

## 2013-01-11 ENCOUNTER — Ambulatory Visit (HOSPITAL_COMMUNITY): Payer: 59 | Admitting: Anesthesiology

## 2013-01-11 ENCOUNTER — Encounter (HOSPITAL_COMMUNITY): Payer: Self-pay | Admitting: Anesthesiology

## 2013-01-11 ENCOUNTER — Encounter (HOSPITAL_COMMUNITY): Payer: Self-pay | Admitting: *Deleted

## 2013-01-11 ENCOUNTER — Encounter (HOSPITAL_COMMUNITY)
Admission: RE | Disposition: A | Discharge status: Home Health | Payer: Self-pay | Source: Ambulatory Visit | Attending: Orthopedic Surgery

## 2013-01-11 DIAGNOSIS — M1710 Unilateral primary osteoarthritis, unspecified knee: Principal | ICD-10-CM | POA: Diagnosis present

## 2013-01-11 DIAGNOSIS — E669 Obesity, unspecified: Secondary | ICD-10-CM | POA: Diagnosis present

## 2013-01-11 DIAGNOSIS — D62 Acute posthemorrhagic anemia: Secondary | ICD-10-CM

## 2013-01-11 DIAGNOSIS — I10 Essential (primary) hypertension: Secondary | ICD-10-CM | POA: Diagnosis present

## 2013-01-11 DIAGNOSIS — E119 Type 2 diabetes mellitus without complications: Secondary | ICD-10-CM | POA: Diagnosis present

## 2013-01-11 DIAGNOSIS — G4733 Obstructive sleep apnea (adult) (pediatric): Secondary | ICD-10-CM | POA: Diagnosis present

## 2013-01-11 DIAGNOSIS — E785 Hyperlipidemia, unspecified: Secondary | ICD-10-CM | POA: Diagnosis present

## 2013-01-11 DIAGNOSIS — Z6839 Body mass index (BMI) 39.0-39.9, adult: Secondary | ICD-10-CM

## 2013-01-11 DIAGNOSIS — M1711 Unilateral primary osteoarthritis, right knee: Secondary | ICD-10-CM | POA: Diagnosis present

## 2013-01-11 DIAGNOSIS — Z96652 Presence of left artificial knee joint: Secondary | ICD-10-CM

## 2013-01-11 HISTORY — PX: TOTAL KNEE ARTHROPLASTY: SHX125

## 2013-01-11 LAB — GLUCOSE, CAPILLARY
Glucose-Capillary: 113 mg/dL — ABNORMAL HIGH (ref 70–99)
Glucose-Capillary: 115 mg/dL — ABNORMAL HIGH (ref 70–99)

## 2013-01-11 LAB — TYPE AND SCREEN
ABO/RH(D): A POS
Antibody Screen: NEGATIVE

## 2013-01-11 LAB — ABO/RH: ABO/RH(D): A POS

## 2013-01-11 SURGERY — ARTHROPLASTY, KNEE, TOTAL
Anesthesia: General | Site: Knee | Laterality: Left | Wound class: Clean

## 2013-01-11 MED ORDER — KETOROLAC TROMETHAMINE 15 MG/ML IJ SOLN
INTRAMUSCULAR | Status: AC
  Filled 2013-01-11: qty 1

## 2013-01-11 MED ORDER — LINAGLIPTIN 5 MG PO TABS
5.0000 mg | ORAL_TABLET | Freq: Every day | ORAL | Status: DC
  Administered 2013-01-11 – 2013-01-13 (×3): 5 mg via ORAL
  Filled 2013-01-11 (×4): qty 1

## 2013-01-11 MED ORDER — ONDANSETRON HCL 4 MG/2ML IJ SOLN: 4.0000 mg | Freq: Four times a day (QID) | INTRAMUSCULAR | Status: DC | PRN

## 2013-01-11 MED ORDER — CHLORHEXIDINE GLUCONATE 4 % EX LIQD
60.0000 mL | Freq: Once | CUTANEOUS | Status: DC
  Filled 2013-01-11: qty 60

## 2013-01-11 MED ORDER — DEXTROSE 5 % IV SOLN
3.0000 g | INTRAVENOUS | Status: AC
  Administered 2013-01-11: 3 g via INTRAVENOUS
  Filled 2013-01-11: qty 3000

## 2013-01-11 MED ORDER — METHOCARBAMOL 500 MG PO TABS
500.0000 mg | ORAL_TABLET | Freq: Four times a day (QID) | ORAL | Status: DC | PRN
  Administered 2013-01-11 – 2013-01-13 (×7): 500 mg via ORAL
  Filled 2013-01-11 (×6): qty 1

## 2013-01-11 MED ORDER — ACETAMINOPHEN 500 MG PO TABS
1000.0000 mg | ORAL_TABLET | Freq: Once | ORAL | Status: AC
  Administered 2013-01-11: 1000 mg via ORAL
  Filled 2013-01-11: qty 2

## 2013-01-11 MED ORDER — LOSARTAN POTASSIUM 50 MG PO TABS: 100.0000 mg | ORAL_TABLET | Freq: Every morning | ORAL | Status: DC

## 2013-01-11 MED ORDER — LOSARTAN POTASSIUM 50 MG PO TABS
100.0000 mg | ORAL_TABLET | Freq: Every day | ORAL | Status: DC
  Administered 2013-01-11 – 2013-01-13 (×3): 100 mg via ORAL
  Filled 2013-01-11 (×3): qty 2

## 2013-01-11 MED ORDER — MORPHINE SULFATE 2 MG/ML IJ SOLN
1.0000 mg | INTRAMUSCULAR | Status: DC | PRN
  Administered 2013-01-11: 1 mg via INTRAVENOUS
  Administered 2013-01-12: 2 mg via INTRAVENOUS
  Filled 2013-01-11: qty 1

## 2013-01-11 MED ORDER — MORPHINE SULFATE 2 MG/ML IJ SOLN
INTRAMUSCULAR | Status: AC
  Filled 2013-01-11: qty 1

## 2013-01-11 MED ORDER — LIDOCAINE HCL (PF) 2 % IJ SOLN
INTRAMUSCULAR | Status: DC | PRN
  Administered 2013-01-11: 100 mg

## 2013-01-11 MED ORDER — KETAMINE HCL 10 MG/ML IJ SOLN
INTRAMUSCULAR | Status: DC | PRN
  Administered 2013-01-11: 20 mg via INTRAVENOUS

## 2013-01-11 MED ORDER — HYDROMORPHONE HCL PF 1 MG/ML IJ SOLN
INTRAMUSCULAR | Status: DC | PRN
  Administered 2013-01-11 (×2): 1 mg via INTRAVENOUS

## 2013-01-11 MED ORDER — METOCLOPRAMIDE HCL 5 MG/ML IJ SOLN: 5.0000 mg | Freq: Three times a day (TID) | INTRAMUSCULAR | Status: DC | PRN

## 2013-01-11 MED ORDER — BUPIVACAINE HCL (PF) 0.25 % IJ SOLN
INTRAMUSCULAR | Status: AC
  Filled 2013-01-11: qty 30

## 2013-01-11 MED ORDER — BISACODYL 10 MG RE SUPP: 10.0000 mg | Freq: Every day | RECTAL | Status: DC | PRN

## 2013-01-11 MED ORDER — METOCLOPRAMIDE HCL 10 MG PO TABS: 5.0000 mg | ORAL_TABLET | Freq: Three times a day (TID) | ORAL | Status: DC | PRN

## 2013-01-11 MED ORDER — CEFAZOLIN SODIUM 1-5 GM-% IV SOLN
INTRAVENOUS | Status: AC
  Filled 2013-01-11: qty 50

## 2013-01-11 MED ORDER — PROPOFOL 10 MG/ML IV BOLUS
INTRAVENOUS | Status: DC | PRN
  Administered 2013-01-11: 300 mg via INTRAVENOUS
  Administered 2013-01-11: 50 mg via INTRAVENOUS

## 2013-01-11 MED ORDER — CEFAZOLIN SODIUM-DEXTROSE 2-3 GM-% IV SOLR
2.0000 g | Freq: Four times a day (QID) | INTRAVENOUS | Status: AC
  Administered 2013-01-11 (×2): 2 g via INTRAVENOUS
  Filled 2013-01-11 (×2): qty 50

## 2013-01-11 MED ORDER — BUPIVACAINE LIPOSOME 1.3 % IJ SUSP
20.0000 mL | Freq: Once | INTRAMUSCULAR | Status: DC
  Filled 2013-01-11: qty 20

## 2013-01-11 MED ORDER — TRAMADOL HCL 50 MG PO TABS: 50.0000 mg | ORAL_TABLET | Freq: Four times a day (QID) | ORAL | Status: DC | PRN

## 2013-01-11 MED ORDER — LACTATED RINGERS IV SOLN
INTRAVENOUS | Status: DC | PRN
  Administered 2013-01-11 (×2): via INTRAVENOUS

## 2013-01-11 MED ORDER — MIDAZOLAM HCL 5 MG/5ML IJ SOLN
INTRAMUSCULAR | Status: DC | PRN
  Administered 2013-01-11: 3 mg via INTRAVENOUS

## 2013-01-11 MED ORDER — ONDANSETRON HCL 4 MG PO TABS: 4.0000 mg | ORAL_TABLET | Freq: Four times a day (QID) | ORAL | Status: DC | PRN

## 2013-01-11 MED ORDER — DEXAMETHASONE 6 MG PO TABS
10.0000 mg | ORAL_TABLET | Freq: Every day | ORAL | Status: AC
  Administered 2013-01-12: 10 mg via ORAL
  Filled 2013-01-11: qty 1

## 2013-01-11 MED ORDER — KETOROLAC TROMETHAMINE 15 MG/ML IJ SOLN
15.0000 mg | Freq: Four times a day (QID) | INTRAMUSCULAR | Status: AC | PRN
  Administered 2013-01-11 (×2): 15 mg via INTRAVENOUS
  Filled 2013-01-11: qty 1

## 2013-01-11 MED ORDER — DIPHENHYDRAMINE HCL 12.5 MG/5ML PO ELIX: 12.5000 mg | ORAL_SOLUTION | ORAL | Status: DC | PRN

## 2013-01-11 MED ORDER — ACETAMINOPHEN 500 MG PO TABS
1000.0000 mg | ORAL_TABLET | Freq: Four times a day (QID) | ORAL | Status: AC
  Administered 2013-01-11 – 2013-01-12 (×4): 1000 mg via ORAL
  Filled 2013-01-11 (×4): qty 2

## 2013-01-11 MED ORDER — SODIUM CHLORIDE 0.9 % IJ SOLN
INTRAMUSCULAR | Status: DC | PRN
  Administered 2013-01-11: 30 mL

## 2013-01-11 MED ORDER — INSULIN ASPART 100 UNIT/ML ~~LOC~~ SOLN
0.0000 [IU] | Freq: Three times a day (TID) | SUBCUTANEOUS | Status: DC
  Administered 2013-01-12: 2 [IU] via SUBCUTANEOUS
  Administered 2013-01-12 (×2): 3 [IU] via SUBCUTANEOUS
  Administered 2013-01-13: 2 [IU] via SUBCUTANEOUS

## 2013-01-11 MED ORDER — LACTATED RINGERS IV SOLN: INTRAVENOUS | Status: DC

## 2013-01-11 MED ORDER — HYDROMORPHONE HCL PF 1 MG/ML IJ SOLN
INTRAMUSCULAR | Status: AC
  Filled 2013-01-11: qty 1

## 2013-01-11 MED ORDER — ACETAMINOPHEN 650 MG RE SUPP: 650.0000 mg | Freq: Four times a day (QID) | RECTAL | Status: AC

## 2013-01-11 MED ORDER — TAMSULOSIN HCL 0.4 MG PO CAPS
0.4000 mg | ORAL_CAPSULE | Freq: Every morning | ORAL | Status: DC
  Administered 2013-01-12 – 2013-01-13 (×2): 0.4 mg via ORAL
  Filled 2013-01-11 (×4): qty 1

## 2013-01-11 MED ORDER — RIVAROXABAN 10 MG PO TABS
10.0000 mg | ORAL_TABLET | Freq: Every day | ORAL | Status: DC
  Administered 2013-01-12 – 2013-01-13 (×2): 10 mg via ORAL
  Filled 2013-01-11 (×3): qty 1

## 2013-01-11 MED ORDER — SODIUM CHLORIDE 0.9 % IV SOLN
INTRAVENOUS | Status: DC
  Administered 2013-01-11 – 2013-01-12 (×2): via INTRAVENOUS

## 2013-01-11 MED ORDER — POLYETHYLENE GLYCOL 3350 17 G PO PACK: 17.0000 g | PACK | Freq: Every day | ORAL | Status: DC | PRN

## 2013-01-11 MED ORDER — AMLODIPINE BESYLATE 10 MG PO TABS
10.0000 mg | ORAL_TABLET | Freq: Every morning | ORAL | Status: DC
  Administered 2013-01-12 – 2013-01-13 (×2): 10 mg via ORAL
  Filled 2013-01-11 (×2): qty 1

## 2013-01-11 MED ORDER — HYDROMORPHONE HCL PF 1 MG/ML IJ SOLN: 0.2500 mg | INTRAMUSCULAR | Status: DC | PRN

## 2013-01-11 MED ORDER — TRANEXAMIC ACID 100 MG/ML IV SOLN
1000.0000 mg | INTRAVENOUS | Status: AC
  Administered 2013-01-11: 1000 mg via INTRAVENOUS
  Filled 2013-01-11: qty 10

## 2013-01-11 MED ORDER — ACETAMINOPHEN 10 MG/ML IV SOLN: 1000.0000 mg | Freq: Four times a day (QID) | INTRAVENOUS | Status: DC

## 2013-01-11 MED ORDER — SODIUM CHLORIDE 0.9 % IV SOLN: INTRAVENOUS | Status: DC

## 2013-01-11 MED ORDER — LIRAGLUTIDE 18 MG/3ML ~~LOC~~ SOPN
1.8000 mg | PEN_INJECTOR | Freq: Every morning | SUBCUTANEOUS | Status: DC
  Administered 2013-01-12: 1.8 mg via SUBCUTANEOUS

## 2013-01-11 MED ORDER — FLEET ENEMA 7-19 GM/118ML RE ENEM: 1.0000 | ENEMA | Freq: Once | RECTAL | Status: AC | PRN

## 2013-01-11 MED ORDER — FENTANYL CITRATE 0.05 MG/ML IJ SOLN
INTRAMUSCULAR | Status: DC | PRN
  Administered 2013-01-11 (×3): 100 ug via INTRAVENOUS
  Administered 2013-01-11: 150 ug via INTRAVENOUS
  Administered 2013-01-11: 100 ug via INTRAVENOUS
  Administered 2013-01-11: 50 ug via INTRAVENOUS

## 2013-01-11 MED ORDER — PHENOL 1.4 % MT LIQD
1.0000 | OROMUCOSAL | Status: DC | PRN
  Filled 2013-01-11: qty 177

## 2013-01-11 MED ORDER — OXYCODONE HCL 5 MG PO TABS
5.0000 mg | ORAL_TABLET | ORAL | Status: DC | PRN
  Administered 2013-01-11 – 2013-01-13 (×17): 10 mg via ORAL
  Filled 2013-01-11 (×17): qty 2

## 2013-01-11 MED ORDER — METHOCARBAMOL 100 MG/ML IJ SOLN
500.0000 mg | Freq: Four times a day (QID) | INTRAMUSCULAR | Status: DC | PRN
  Administered 2013-01-11: 500 mg via INTRAVENOUS
  Filled 2013-01-11: qty 5

## 2013-01-11 MED ORDER — CEFAZOLIN SODIUM-DEXTROSE 2-3 GM-% IV SOLR
INTRAVENOUS | Status: AC
  Filled 2013-01-11: qty 50

## 2013-01-11 MED ORDER — SUCCINYLCHOLINE CHLORIDE 20 MG/ML IJ SOLN
INTRAMUSCULAR | Status: DC | PRN
  Administered 2013-01-11: 140 mg via INTRAVENOUS

## 2013-01-11 MED ORDER — SAXAGLIPTIN-METFORMIN ER 2.5-1000 MG PO TB24: 1.0000 | ORAL_TABLET | Freq: Two times a day (BID) | ORAL | Status: DC

## 2013-01-11 MED ORDER — METFORMIN HCL 500 MG PO TABS
1000.0000 mg | ORAL_TABLET | Freq: Two times a day (BID) | ORAL | Status: DC
  Administered 2013-01-11: 1000 mg via ORAL
  Filled 2013-01-11 (×4): qty 2

## 2013-01-11 MED ORDER — MENTHOL 3 MG MT LOZG
1.0000 | LOZENGE | OROMUCOSAL | Status: DC | PRN
  Filled 2013-01-11: qty 9

## 2013-01-11 MED ORDER — ACETAMINOPHEN 10 MG/ML IV SOLN
INTRAVENOUS | Status: AC
  Administered 2013-01-11: 1000 mg
  Filled 2013-01-11: qty 100

## 2013-01-11 MED ORDER — HYDROCHLOROTHIAZIDE 25 MG PO TABS
25.0000 mg | ORAL_TABLET | Freq: Every morning | ORAL | Status: DC
  Administered 2013-01-11: 25 mg via ORAL
  Filled 2013-01-11 (×3): qty 1

## 2013-01-11 MED ORDER — BUPIVACAINE HCL 0.25 % IJ SOLN
INTRAMUSCULAR | Status: DC | PRN
  Administered 2013-01-11: 20 mL

## 2013-01-11 MED ORDER — ONDANSETRON HCL 4 MG/2ML IJ SOLN
INTRAMUSCULAR | Status: DC | PRN
  Administered 2013-01-11: 4 mg via INTRAVENOUS

## 2013-01-11 MED ORDER — HYDROMORPHONE HCL PF 1 MG/ML IJ SOLN
0.2500 mg | INTRAMUSCULAR | Status: DC | PRN
  Administered 2013-01-11 (×6): 0.5 mg via INTRAVENOUS

## 2013-01-11 MED ORDER — DOCUSATE SODIUM 100 MG PO CAPS
100.0000 mg | ORAL_CAPSULE | Freq: Two times a day (BID) | ORAL | Status: DC
  Administered 2013-01-11 – 2013-01-12 (×3): 100 mg via ORAL

## 2013-01-11 MED ORDER — DEXAMETHASONE SODIUM PHOSPHATE 10 MG/ML IJ SOLN
10.0000 mg | Freq: Every day | INTRAMUSCULAR | Status: AC
  Filled 2013-01-11: qty 1

## 2013-01-11 MED ORDER — BUPIVACAINE LIPOSOME 1.3 % IJ SUSP
INTRAMUSCULAR | Status: DC | PRN
  Administered 2013-01-11: 20 mL

## 2013-01-11 SURGICAL SUPPLY — 58 items
BAG ZIPLOCK 12X15 (MISCELLANEOUS) ×2 IMPLANT
BANDAGE ELASTIC 6 VELCRO ST LF (GAUZE/BANDAGES/DRESSINGS) ×2 IMPLANT
BANDAGE ESMARK 6X9 LF (GAUZE/BANDAGES/DRESSINGS) ×1 IMPLANT
BENZOIN TINCTURE PRP APPL 2/3 (GAUZE/BANDAGES/DRESSINGS) ×2 IMPLANT
BLADE SAG 18X100X1.27 (BLADE) ×2 IMPLANT
BLADE SAW SGTL 11.0X1.19X90.0M (BLADE) ×2 IMPLANT
BNDG ESMARK 6X9 LF (GAUZE/BANDAGES/DRESSINGS) ×2
BOWL SMART MIX CTS (DISPOSABLE) ×2 IMPLANT
CAPT RP KNEE ×2 IMPLANT
CEMENT HV SMART SET (Cement) ×4 IMPLANT
CLOTH BEACON ORANGE TIMEOUT ST (SAFETY) ×2 IMPLANT
CUFF TOURN SGL QUICK 34 (TOURNIQUET CUFF) ×1
CUFF TRNQT CYL 34X4X40X1 (TOURNIQUET CUFF) ×1 IMPLANT
DECANTER SPIKE VIAL GLASS SM (MISCELLANEOUS) ×2 IMPLANT
DRAPE EXTREMITY T 121X128X90 (DRAPE) ×2 IMPLANT
DRAPE POUCH INSTRU U-SHP 10X18 (DRAPES) ×2 IMPLANT
DRAPE U-SHAPE 47X51 STRL (DRAPES) ×2 IMPLANT
DRSG ADAPTIC 3X8 NADH LF (GAUZE/BANDAGES/DRESSINGS) ×2 IMPLANT
DRSG PAD ABDOMINAL 8X10 ST (GAUZE/BANDAGES/DRESSINGS) ×2 IMPLANT
DURAPREP 26ML APPLICATOR (WOUND CARE) ×2 IMPLANT
ELECT REM PT RETURN 9FT ADLT (ELECTROSURGICAL) ×2
ELECTRODE REM PT RTRN 9FT ADLT (ELECTROSURGICAL) ×1 IMPLANT
EVACUATOR 1/8 PVC DRAIN (DRAIN) ×2 IMPLANT
FACESHIELD LNG OPTICON STERILE (SAFETY) ×10 IMPLANT
GLOVE BIO SURGEON STRL SZ7.5 (GLOVE) IMPLANT
GLOVE BIO SURGEON STRL SZ8 (GLOVE) ×2 IMPLANT
GLOVE BIOGEL PI IND STRL 8 (GLOVE) ×1 IMPLANT
GLOVE BIOGEL PI INDICATOR 8 (GLOVE) ×1
GLOVE SURG SS PI 6.5 STRL IVOR (GLOVE) IMPLANT
GOWN STRL NON-REIN LRG LVL3 (GOWN DISPOSABLE) ×2 IMPLANT
GOWN STRL REIN XL XLG (GOWN DISPOSABLE) IMPLANT
HANDPIECE INTERPULSE COAX TIP (DISPOSABLE) ×1
IMMOBILIZER KNEE 20 (SOFTGOODS) ×2
IMMOBILIZER KNEE 20 THIGH 36 (SOFTGOODS) ×1 IMPLANT
IMMOBILIZER KNEE 22 UNIV (SOFTGOODS) ×2 IMPLANT
KIT BASIN OR (CUSTOM PROCEDURE TRAY) ×2 IMPLANT
MANIFOLD NEPTUNE II (INSTRUMENTS) ×2 IMPLANT
NDL SAFETY ECLIPSE 18X1.5 (NEEDLE) ×2 IMPLANT
NEEDLE HYPO 18GX1.5 SHARP (NEEDLE) ×2
NS IRRIG 1000ML POUR BTL (IV SOLUTION) ×2 IMPLANT
PACK TOTAL JOINT (CUSTOM PROCEDURE TRAY) ×2 IMPLANT
PAD ABD 7.5X8 STRL (GAUZE/BANDAGES/DRESSINGS) ×2 IMPLANT
PADDING CAST COTTON 6X4 STRL (CAST SUPPLIES) ×2 IMPLANT
POSITIONER SURGICAL ARM (MISCELLANEOUS) ×2 IMPLANT
SET HNDPC FAN SPRY TIP SCT (DISPOSABLE) ×1 IMPLANT
SPONGE GAUZE 4X4 12PLY (GAUZE/BANDAGES/DRESSINGS) ×2 IMPLANT
STRIP CLOSURE SKIN 1/2X4 (GAUZE/BANDAGES/DRESSINGS) ×2 IMPLANT
SUCTION FRAZIER 12FR DISP (SUCTIONS) ×2 IMPLANT
SUT MNCRL AB 4-0 PS2 18 (SUTURE) ×2 IMPLANT
SUT VIC AB 2-0 CT1 27 (SUTURE) ×3
SUT VIC AB 2-0 CT1 TAPERPNT 27 (SUTURE) ×3 IMPLANT
SUT VLOC 180 0 24IN GS25 (SUTURE) ×2 IMPLANT
SYR 20CC LL (SYRINGE) ×2 IMPLANT
SYR 50ML LL SCALE MARK (SYRINGE) ×2 IMPLANT
TOWEL OR 17X26 10 PK STRL BLUE (TOWEL DISPOSABLE) ×4 IMPLANT
TRAY FOLEY CATH 14FRSI W/METER (CATHETERS) ×2 IMPLANT
WATER STERILE IRR 1500ML POUR (IV SOLUTION) ×2 IMPLANT
WRAP KNEE MAXI GEL POST OP (GAUZE/BANDAGES/DRESSINGS) ×2 IMPLANT

## 2013-01-11 NOTE — Interval H&P Note (Signed)
History and Physical Interval Note:  01/11/2013 7:06 AM  Rodney Hayes  has presented today for surgery, with the diagnosis of oa left knee   The various methods of treatment have been discussed with the patient and family. After consideration of risks, benefits and other options for treatment, the patient has consented to  Procedure(s): LEFT TOTAL KNEE ARTHROPLASTY (Left) as a surgical intervention .  The patient's history has been reviewed, patient examined, no change in status, stable for surgery.  I have reviewed the patient's chart and labs.  Questions were answered to the patient's satisfaction.     Loanne Drilling

## 2013-01-11 NOTE — Op Note (Signed)
Pre-operative diagnosis- Osteoarthritis  Left knee(s)  Post-operative diagnosis- Osteoarthritis Left knee(s)  Procedure-  Left  Total Knee Arthroplasty  Surgeon- Rodney Hayes. Rodney Lahmann, MD  Assistant- Rodney Peace, PA-C   Anesthesia-  General EBL-* No blood loss amount entered *  Drains Hemovac  Tourniquet time-  Total Tourniquet Time Documented: Thigh (Left) - 42 minutes Total: Thigh (Left) - 42 minutes    Complications- None  Condition-PACU - hemodynamically stable.   Brief Clinical Note   Rodney Hayes is a 62 y.o. year old male with end stage OA of his left knee with progressively worsening pain and dysfunction. He has constant pain, with activity and at rest and significant functional deficits with difficulties even with ADLs. He has had extensive non-op management including analgesics, injections of cortisone and viscosupplements, and home exercise program, but remains in significant pain with significant dysfunction. Radiographs show bone on bone arthritis medial and patellofemoral. He presents now for left Total Knee Arthroplasty.     Procedure in detail---   The patient is brought into the operating room and positioned supine on the operating table. After successful administration of  General,   a tourniquet is placed high on the  Left thigh(s) and the lower extremity is prepped and draped in the usual sterile fashion. Time out is performed by the operating team and then the  Left lower extremity is wrapped in Esmarch, knee flexed and the tourniquet inflated to 300 mmHg.       A midline incision is made with a ten blade through the subcutaneous tissue to the level of the extensor mechanism. A fresh blade is used to make a medial parapatellar arthrotomy. Soft tissue over the proximal medial tibia is subperiosteally elevated to the joint line with a knife and into the semimembranosus bursa with a Cobb elevator. Soft tissue over the proximal lateral tibia is elevated with attention  being paid to avoiding the patellar tendon on the tibial tubercle. The patella is everted, knee flexed 90 degrees and the ACL and PCL are removed. Findings are bone on bone medial and patellofemoral with large global osteophytes.        The drill is used to create a starting hole in the distal femur and the canal is thoroughly irrigated with sterile saline to remove the fatty contents. The 5 degree Left  valgus alignment guide is placed into the femoral canal and the distal femoral cutting block is pinned to remove 11 mm off the distal femur. Resection is made with an oscillating saw.      The tibia is subluxed forward and the menisci are removed. The extramedullary alignment guide is placed referencing proximally at the medial aspect of the tibial tubercle and distally along the second metatarsal axis and tibial crest. The block is pinned to remove 2mm off the more deficient medial  side. Resection is made with an oscillating saw. Size 5is the most appropriate size for the tibia and the proximal tibia is prepared with the modular drill and keel punch for that size.      The femoral sizing guide is placed and size 5 is most appropriate. Rotation is marked off the epicondylar axis and confirmed by creating a rectangular flexion gap at 90 degrees. The size 5 cutting block is pinned in this rotation and the anterior, posterior and chamfer cuts are made with the oscillating saw. The intercondylar block is then placed and that cut is made.      Trial size 5 tibial component, trial  size 5 posterior stabilized femur and a 10  mm posterior stabilized rotating platform insert trial is placed. Full extension is achieved with excellent varus/valgus and anterior/posterior balance throughout full range of motion. The patella is everted and thickness measured to be 27  mm. Free hand resection is taken to 15 mm, a 41 template is placed, lug holes are drilled, trial patella is placed, and it tracks normally. Osteophytes are  removed off the posterior femur with the trial in place. All trials are removed and the cut bone surfaces prepared with pulsatile lavage. Cement is mixed and once ready for implantation, the size 5 tibial implant, size  5 posterior stabilized femoral component, and the size 41 patella are cemented in place and the patella is held with the clamp. The trial insert is placed and the knee held in full extension. The Exparel (20 ml mixed with 30 ml saline) and .25% Bupivicaine, are injected into the extensor mechanism, posterior capsule, medial and lateral gutters and subcutaneous tissues.  All extruded cement is removed and once the cement is hard the permanent 10 mm posterior stabilized rotating platform insert is placed into the tibial tray.      The wound is copiously irrigated with saline solution and the extensor mechanism closed over a hemovac drain with #1 PDS suture. The tourniquet is released for a total tourniquet time of 42  minutes. Flexion against gravity is 135 degrees and the patella tracks normally. Subcutaneous tissue is closed with 2.0 vicryl and subcuticular with running 4.0 Monocryl. The incision is cleaned and dried and steri-strips and a bulky sterile dressing are applied. The limb is placed into a knee immobilizer and the patient is awakened and transported to recovery in stable condition.      Please note that a surgical assistant was a medical necessity for this procedure in order to perform it in a safe and expeditious manner. Surgical assistant was necessary to retract the ligaments and vital neurovascular structures to prevent injury to them and also necessary for proper positioning of the limb to allow for anatomic placement of the prosthesis.   Rodney Hayes Rodney Simonian, MD    01/11/2013, 9:25 AM

## 2013-01-11 NOTE — Care Management Note (Addendum)
    Page 1 of 2   01/13/2013     11:19:23 AM   CARE MANAGEMENT NOTE 01/13/2013  Patient:  Rodney Hayes, Rodney Hayes   Account Number:  [id number]  Date Initiated:  01/11/2013  Documentation initiated by:  Colleen Can  Subjective/Objective Assessment:   dx end stage arthritis left knee; total knee replacemnt on day of admission     Action/Plan:   Home with [facility] services. PT/OT pending.  CM will f/u   Anticipated DC Date:  01/14/2013   Anticipated DC Plan:  HOME W HOME HEALTH SERVICES      DC Planning Services  CM consult      PAC Choice  DURABLE MEDICAL EQUIPMENT  HOME HEALTH   Choice offered to / List presented to:  C-1 Patient   DME arranged  3-N-1      DME agency  [facility]     HH arranged  HH-2 PT      [place] agency  [facility]   Status of service:  Completed, signed off Medicare Important Message given?   (If response is "NO", the following Medicare IM given date fields will be blank) Date Medicare IM given:   Date Additional Medicare IM given:    Discharge Disposition:  HOME W HOME HEALTH SERVICES  Per UR Regulation:  Reviewed for med. necessity/level of care/duration of stay  If discussed at Long Length of Stay Meetings, dates discussed:    Comments:  01/13/2013 Colleen Can BSN RN CCM [phone] Anticipate discharge today with [facility] to provide [facility] services. 3n1 has been delivered to patient's room.   01/12/2013 Colleen Can BSN RN CCM [phone] CM spoke with patient and spouse. Plans are for him to return to his home where spouse will be caregiver. He already has RW. Wants [facility] for HHpt services. [facility] Not[facility] and advised that they can service patient.

## 2013-01-11 NOTE — Evaluation (Signed)
Physical Therapy Evaluation Patient Details Name: Rodney Hayes MRN: [id number] DOB: [birth year] Today's Date: 01/11/2013 Time: 1637-1700 PT Time Calculation (min): 23 min  PT Assessment / Plan / Recommendation History of Present Illness  62 yo male pt admitted for LTKA on 01/11/13. Pt. has h/o sleep apnea, DM.  Clinical Impression  Pt is performing a SLR today. Tolerated ambulating in hall x 50 '. Pt plans to DC home. Will benefit from PT while in acute care.    PT Assessment  Patient needs continued PT services    Follow Up Recommendations  Home health PT    Does the patient have the potential to tolerate intense rehabilitation      Barriers to Discharge        Equipment Recommendations  None recommended by PT    Recommendations for Other Services     Frequency 7X/week    Precautions / Restrictions Precautions Precautions: Knee Required Braces or Orthoses: Knee Immobilizer - Left Knee Immobilizer - Left: Discontinue once straight leg raise with < 10 degree lag Restrictions Weight Bearing Restrictions: No   Pertinent Vitals/Pain Pain 5 L knee ice applied, Premedicated.      Mobility  Bed Mobility Bed Mobility: Supine to Sit Supine to Sit: 4: Min guard;HOB elevated;With rails Transfers Transfers: Sit to Stand;Stand to Sit Sit to Stand: 4: Min assist;With upper extremity assist;From bed Stand to Sit: 4: Min assist;With upper extremity assist;To bed Details for Transfer Assistance: cues for hand placement. Ambulation/Gait Ambulation/Gait Assistance: 4: Min assist Ambulation Distance (Feet): 50 Feet Assistive device: Rolling walker Ambulation/Gait Assistance Details: cues for sequence. Gait Pattern: Step-to pattern    Exercises Total Joint Exercises Ankle Circles/Pumps: AROM;Both;10 reps Quad Sets: AROM;Left;10 reps Straight Leg Raises: AROM;Left;5 reps   PT Diagnosis: Acute pain;Difficulty walking  PT Problem List: Decreased strength;Decreased range of  motion;Decreased activity tolerance;Decreased mobility;Decreased knowledge of use of DME;Decreased knowledge of precautions PT Treatment Interventions: DME instruction;Gait training;Stair training;Functional mobility training;Therapeutic activities;Therapeutic exercise;Patient/family education     PT Goals(Current goals can be found in the care plan section) Acute Rehab PT Goals Patient Stated Goal: I wanted to get up today and see how it goes. PT Goal Formulation: With patient/family Time For Goal Achievement: 01/18/13 Potential to Achieve Goals: Good  Visit Information  Last PT Received On: 01/11/13 Assistance Needed: +1 History of Present Illness: 62 yo male pt admitted for LTKA on 01/11/13. Pt. has h/o sleep apnea, DM.       Prior Functioning  Home Living Living Arrangements: Children Prior Function Level of Independence: Independent Communication Communication: No difficulties    Cognition  Cognition Arousal/Alertness: Awake/alert Behavior During Therapy: WFL for tasks assessed/performed Overall Cognitive Status: Within Functional Limits for tasks assessed    Extremity/Trunk Assessment Upper Extremity Assessment Upper Extremity Assessment: Overall WFL for tasks assessed Lower Extremity Assessment Lower Extremity Assessment: LLE deficits/detail;Overall WFL for tasks assessed LLE Deficits / Details: is able to perform a SLR with 10 degree lag.   Balance    End of Session PT - End of Session Equipment Utilized During Treatment: Left knee immobilizer Activity Tolerance: Patient tolerated treatment well Patient left: in bed;with call bell/phone within reach;with family/visitor present Nurse Communication: Mobility status CPM Left Knee CPM Left Knee: Off  GP     Rodney Hayes 01/11/2013, 5:12 PM  Blanchard Kelch PT [phone]

## 2013-01-11 NOTE — Anesthesia Postprocedure Evaluation (Signed)
  Anesthesia Post-op Note  Patient: Rodney Hayes  Procedure(s) Performed: Procedure(s) (LRB): LEFT TOTAL KNEE ARTHROPLASTY (Left)  Patient Location: PACU  Anesthesia Type: General  Level of Consciousness: awake and alert   Airway and Oxygen Therapy: Patient Spontanous Breathing  Post-op Pain: mild  Post-op Assessment: Post-op Vital signs reviewed, Patient's Cardiovascular Status Stable, Respiratory Function Stable, Patent Airway and No signs of Nausea or vomiting  Last Vitals:  Filed Vitals:   01/11/13 1140  BP: 147/80  Pulse: 99  Temp: 37.4 C  Resp: 16    Post-op Vital Signs: stable   Complications: No apparent anesthesia complications

## 2013-01-11 NOTE — Plan of Care (Signed)
Problem: Consults Goal: Diagnosis- Total Joint Replacement Left total knee     

## 2013-01-11 NOTE — Transfer of Care (Signed)
Immediate Anesthesia Transfer of Care Note  Patient: Rodney Hayes  Procedure(s) Performed: Procedure(s): LEFT TOTAL KNEE ARTHROPLASTY (Left)  Patient Location: PACU  Anesthesia Type:General  Level of Consciousness: awake, alert , oriented and patient cooperative  Airway & Oxygen Therapy: Patient Spontanous Breathing and Patient connected to face mask oxygen  Post-op Assessment: Report given to PACU RN and Post -op Vital signs reviewed and stable  Post vital signs: Reviewed and stable  Complications: No apparent anesthesia complications

## 2013-01-12 LAB — CBC
HCT: 33.9 % — ABNORMAL LOW (ref 39.0–52.0)
Hemoglobin: 11.4 g/dL — ABNORMAL LOW (ref 13.0–17.0)
MCH: 31 pg (ref 26.0–34.0)
MCHC: 33.6 g/dL (ref 30.0–36.0)
MCV: 92.1 fL (ref 78.0–100.0)

## 2013-01-12 LAB — BASIC METABOLIC PANEL
BUN: 9 mg/dL (ref 6–23)
Calcium: 8.8 mg/dL (ref 8.4–10.5)
Creatinine, Ser: 0.86 mg/dL (ref 0.50–1.35)
GFR calc non Af Amer: 90 mL/min — ABNORMAL LOW (ref >90)
Glucose, Bld: 125 mg/dL — ABNORMAL HIGH (ref 70–99)

## 2013-01-12 LAB — GLUCOSE, CAPILLARY: Glucose-Capillary: 142 mg/dL — ABNORMAL HIGH (ref 70–99)

## 2013-01-12 NOTE — Progress Notes (Signed)
Met with Mr Belles at bedside to discuss [facility] program as a benefit of having [facility] [employer].  He reports he is active with [facility] [employer]. He is followed by the pharmacist. Likely discharge home with home health services. No further needs assessed.  Raiford Noble, MSN- Ed, RN,BSN- [facility] [phone]

## 2013-01-12 NOTE — Progress Notes (Signed)
Physical Therapy Treatment Patient Details Name: Rodney Hayes MRN: [id number] DOB: 28-Oct-1950 Today's Date: 01/12/2013 Time:  -     PT Assessment / Plan / Recommendation  PT Comments   POD # 1 am session L TKR assisted pt OOB to amb in hallway then performed TE's followed by ICE.  Follow Up Recommendations  Home health PT     Does the patient have the potential to tolerate intense rehabilitation     Barriers to Discharge        Equipment Recommendations  None recommended by PT    Recommendations for Other Services    Frequency 7X/week   Progress towards PT Goals    Plan      Precautions / Restrictions Precautions Precautions: Knee Required Braces or Orthoses: Knee Immobilizer - Left Knee Immobilizer - Left: Discontinue once straight leg raise with < 10 degree lag Restrictions Weight Bearing Restrictions: No Other Position/Activity Restrictions: WBAT    Pertinent Vitals/Pain C/o 6/10 ICE applied    Mobility  Bed Mobility Bed Mobility: Supine to Sit Supine to Sit: 4: Min guard;HOB elevated;With rails Details for Bed Mobility Assistance: Demon and instructed on how to use a belt to assist L LE off bed Transfers Transfers: Sit to Stand Sit to Stand: 4: Min assist;With upper extremity assist;From bed Stand to Sit: 4: Min assist;With upper extremity assist;To bed Details for Transfer Assistance: cues for hand placement. Ambulation/Gait Ambulation/Gait Assistance: 4: Min assist Ambulation Distance (Feet): 75 Feet Assistive device: Rolling walker Ambulation/Gait Assistance Details: <25% VC's on proper L LE placement and safety with turns. Gait Pattern: Step-to pattern    Exercises   Total Knee Replacement TE's 10 reps B LE ankle pumps 10 reps knee presses 10 reps heel slides  10 reps SAQ's 10 reps SLR's 10 reps ABD Followed by ICE    PT Goals (current goals can now be found in the care plan section)    Visit Information  Last PT Received On:  01/12/13 Assistance Needed: +1 History of Present Illness: 62 yo male pt admitted for LTKA on 01/11/13. Pt. has h/o sleep apnea, DM.    Subjective Data      Cognition       Balance   fair  End of Session PT - End of Session Equipment Utilized During Treatment: Left knee immobilizer Activity Tolerance: Patient tolerated treatment well Patient left: in chair;with call bell/phone within reach;with family/visitor present   Felecia Shelling  PTA [facility]  Acute  Rehab Pager      [phone]

## 2013-01-12 NOTE — Progress Notes (Signed)
OT Cancellation Note  Patient Details Name: Rodney Hayes MRN: [id number] DOB: October 01, 1950   Cancelled Treatment:    Reason Eval/Treat Not Completed: OT screened, no needs identified, will sign off  Pt stated that someone came in and will order a 3:1 commode for him.  Disaya Walt 01/12/2013, 1:10 PM Marica Otter, OTR/L [phone] 01/12/2013

## 2013-01-12 NOTE — Progress Notes (Signed)
01/12/2013 Colleen Can BSN RN CCM [phone] CM spoke with patient and spouse. Plans are for him to return to his home where spouse will be caregiver. He already has RW. Wants [facility] for HHpt services. [facility] Not[facility] and advised that they can service patient.

## 2013-01-12 NOTE — Progress Notes (Signed)
Physical Therapy Treatment Patient Details Name: Rodney Hayes MRN: [id number] DOB: 11-Nov-1950 Today's Date: 01/12/2013 Time:  -     PT Assessment / Plan / Recommendation  PT Comments   POD # 1 pm session L TKR.  Assisted pt OOB to amb in hallway second time then back to bed for CPM.  Pt plans to D/C to home tomorrow.   Follow Up Recommendations  Home health PT     Does the patient have the potential to tolerate intense rehabilitation     Barriers to Discharge        Equipment Recommendations  None recommended by PT    Recommendations for Other Services    Frequency 7X/week   Progress towards PT Goals    Plan      Precautions / Restrictions Precautions Precautions: Knee Required Braces or Orthoses: Knee Immobilizer - Left Knee Immobilizer - Left: Discontinue once straight leg raise with < 10 degree lag Restrictions Weight Bearing Restrictions: No Other Position/Activity Restrictions: WBAT    Pertinent Vitals/Pain C/o 6/10 L knee pain Pain meds requested    Mobility  Bed Mobility Bed Mobility: Supine to Sit;Sit to Supine Supine to Sit: 4: Min assist Sit to Supine: 4: Min assist Details for Bed Mobility Assistance: Min assist to support L LE and increased time Transfers Transfers: Sit to Stand Sit to Stand: 4: Min guard;From bed Stand to Sit: 4: Min guard;To bed Details for Transfer Assistance: increased time Ambulation/Gait Ambulation/Gait Assistance: 4: Min guard Ambulation Distance (Feet): 95 Feet Assistive device: Rolling walker Ambulation/Gait Assistance Details: increased time Gait Pattern: Step-to pattern     PT Goals (current goals can now be found in the care plan section)    Visit Information  Last PT Received On: 01/12/13 Assistance Needed: +1 History of Present Illness: 62 yo male pt admitted for LTKA on 01/11/13. Pt. has h/o sleep apnea, DM.    Subjective Data      Cognition       Balance     End of Session PT - End of  Session Equipment Utilized During Treatment: Left knee immobilizer Activity Tolerance: Patient tolerated treatment well Patient left: in bed;with call bell/phone within reach;with nursing/sitter in room   Felecia Shelling  PTA [facility]  Acute  Rehab Pager      [phone]

## 2013-01-12 NOTE — Progress Notes (Signed)
   Subjective: 1 Day Post-Op Procedure(s) (LRB): LEFT TOTAL KNEE ARTHROPLASTY (Left) Patient reports pain as mild and moderate.   Patient seen in rounds with Dr. Lequita Halt. Patient is well, but has had some Heyden complaints of pain in the knee and tightness, requiring pain medications We will start therapy today. Walked 50 feet yesterday. Plan is to go Home after hospital stay.  Objective: Vital signs in last 24 hours: Temp:  [98.3 F (36.8 C)-99.5 F (37.5 C)] 99.5 F (37.5 C) (07/01 0523) Pulse Rate:  [72-103] 75 (07/01 0523) Resp:  [14-18] 16 (07/01 0523) BP: (106-188)/(50-88) 152/72 mmHg (07/01 0523) SpO2:  [93 %-100 %] 93 % (07/01 0523) Weight:  [131.997 kg (291 lb)] 131.997 kg (291 lb) (06/30 1030)  Intake/Output from previous day:  Intake/Output Summary (Last 24 hours) at 01/12/13 0754 Last data filed at 01/12/13 1610  Gross per 24 hour  Intake   4340 ml  Output   5320 ml  Net   -980 ml    Intake/Output this shift: UOP 2400 since MN -980  Labs:  Recent Labs  01/12/13 0523  HGB 11.4*    Recent Labs  01/12/13 0523  WBC 9.1  RBC 3.68*  HCT 33.9*  PLT 182    Recent Labs  01/12/13 0523  NA 138  K 3.8  CL 98  CO2 33*  BUN 9  CREATININE 0.86  GLUCOSE 125*  CALCIUM 8.8   No results found for this basename: LABPT, INR,  in the last 72 hours  EXAM General - Patient is Alert, Appropriate and Oriented Extremity - Neurovascular intact Sensation intact distally Dorsiflexion/Plantar flexion intact Dressing - dressing C/D/I Motor Function - intact, moving foot and toes well on exam.  Hemovac pulled without difficulty.  Past Medical History  Diagnosis Date  . Hyperlipidemia   . Hypertension   . Diabetes mellitus   . Obesity   . Nonspecific abnormal electrocardiogram (ECG) (EKG)   . Asthma     AS CHILD  . Arthritis   . DJD (degenerative joint disease)   . BPH (benign prostatic hyperplasia)   . Sleep apnea     USES C-PAP     Assessment/Plan: 1 Day Post-Op Procedure(s) (LRB): LEFT TOTAL KNEE ARTHROPLASTY (Left) Principal Problem:   OA (osteoarthritis) of knee  Estimated body mass index is 39.46 kg/(m^2) as calculated from the following:   Height as of this encounter: 6' (1.829 m).   Weight as of this encounter: 131.997 kg (291 lb). Advance diet Up with therapy Plan for discharge tomorrow Discharge home with home health  DVT Prophylaxis - Xarelto Weight-Bearing as tolerated to left leg No vaccines. D/C O2 and Pulse OX and try on Room [address]  Patrica Duel 01/12/2013, 7:54 AM

## 2013-01-13 ENCOUNTER — Encounter: Payer: Self-pay | Admitting: Internal Medicine

## 2013-01-13 DIAGNOSIS — D62 Acute posthemorrhagic anemia: Secondary | ICD-10-CM

## 2013-01-13 LAB — BASIC METABOLIC PANEL
BUN: 10 mg/dL (ref 6–23)
GFR calc Af Amer: >90 mL/min (ref >90)
GFR calc non Af Amer: >90 mL/min (ref >90)
Potassium: 3.6 mEq/L (ref 3.5–5.1)

## 2013-01-13 LAB — CBC
HCT: 34.1 % — ABNORMAL LOW (ref 39.0–52.0)
MCHC: 33.7 g/dL (ref 30.0–36.0)
RDW: 12.8 % (ref 11.5–15.5)

## 2013-01-13 LAB — GLUCOSE, CAPILLARY
Glucose-Capillary: 127 mg/dL — ABNORMAL HIGH (ref 70–99)
Glucose-Capillary: 100 mg/dL — ABNORMAL HIGH (ref 70–99)

## 2013-01-13 MED ORDER — METHOCARBAMOL 500 MG PO TABS: 500.0000 mg | ORAL_TABLET | Freq: Four times a day (QID) | ORAL | Status: DC | PRN

## 2013-01-13 MED ORDER — RIVAROXABAN 10 MG PO TABS: 10.0000 mg | ORAL_TABLET | Freq: Every day | ORAL | Status: DC

## 2013-01-13 MED ORDER — TRAMADOL HCL 50 MG PO TABS: 50.0000 mg | ORAL_TABLET | Freq: Four times a day (QID) | ORAL | Status: DC | PRN

## 2013-01-13 MED ORDER — OXYCODONE HCL 5 MG PO TABS: 5.0000 mg | ORAL_TABLET | ORAL | Status: DC | PRN

## 2013-01-13 NOTE — Progress Notes (Signed)
[facility]   [facility] is providing the following services: [employer]  If patient discharges after hours, please call [phone].   Renard Hamper [phone] 01/13/2013, 10:02 AM

## 2013-01-13 NOTE — Progress Notes (Signed)
Physical Therapy Treatment Patient Details Name: Rodney Hayes MRN: [id number] DOB: [birth year] Today's Date: 01/13/2013 Time: 6295-2841 PT Time Calculation (min): 38 min  PT Assessment / Plan / Recommendation  PT Comments   POD # 2 L TKR planning to D/C to home today.  Assisted OOB to amb in hallway, practiced steps and performed TE's.  Pt instructed on KI use for steps.  Pt instructed on use of ICE.  Pt given HEP.  All questions addressed.   Follow Up Recommendations  Home health PT     Does the patient have the potential to tolerate intense rehabilitation     Barriers to Discharge        Equipment Recommendations  None recommended by PT    Recommendations for Other Services    Frequency 7X/week   Progress towards PT Goals    Plan      Precautions / Restrictions Precautions Precautions: Knee Precaution Comments: Pt able to perform 10 active SLR so instructed pt to D/C for amb but weat to get up the steps today Restrictions Weight Bearing Restrictions: No Other Position/Activity Restrictions: WBAT    Pertinent Vitals/Pain C/o 7/10 during TE's ICE applied Pre medicated    Mobility  Bed Mobility Bed Mobility: Supine to Sit;Sit to Supine Supine to Sit: 4: Min guard Sit to Supine: 4: Min guard Details for Bed Mobility Assistance: increased time Transfers Transfers: Sit to Stand Sit to Stand: 5: Supervision;From bed Stand to Sit: 5: Supervision;To bed Details for Transfer Assistance: increased time, good safety tech Ambulation/Gait Ambulation Distance (Feet): 125 Feet Assistive device: Rolling walker Ambulation/Gait Assistance Details: one VC on safety with turns and backward gait to bed Gait Pattern: Step-to pattern Gait velocity: WFL Stairs: Yes Stairs Assistance: 4: Min assist Stairs Assistance Details (indicate cue type and reason): 25% VC's on proper tech and sequencing plus walker placement Stair Management Technique: No rails;Backwards;With walker Number  of Stairs: 2    Exercises   Total Knee Replacement TE's 10 reps B LE ankle pumps 10 reps knee presses 10 reps heel slides  10 reps SAQ's 10 reps SLR's 10 reps ABD Followed by ICE    PT Goals (current goals can now be found in the care plan section)    Visit Information  Last PT Received On: 01/13/13 Assistance Needed: +1    Subjective Data      Cognition       Balance     End of Session PT - End of Session Equipment Utilized During Treatment: Left knee immobilizer;Gait belt Activity Tolerance: Patient tolerated treatment well Patient left: in bed;with call bell/phone within reach;with nursing/sitter in room Nurse Communication:  (Pt ready for D/C) CPM Left Knee CPM Left Knee: Off Left Knee Flexion (Degrees): 40 Left Knee Extension (Degrees): 10   Felecia Shelling  PTA [facility]  Acute  Rehab Pager      [phone]

## 2013-01-13 NOTE — Discharge Summary (Signed)
Physician Discharge Summary   Patient ID: Rodney Hayes MRN: [id number] DOB/AGE: 1951/04/29 62 y.o.  Admit date: 01/11/2013 Discharge date: 01/13/2013  Primary Diagnosis: Osteoarthritis Left knee   Admission Diagnoses:  Past Medical History  Diagnosis Date  . Hyperlipidemia   . Hypertension   . Diabetes mellitus   . Obesity   . Nonspecific abnormal electrocardiogram (ECG) (EKG)   . Asthma     AS CHILD  . Arthritis   . DJD (degenerative joint disease)   . BPH (benign prostatic hyperplasia)   . Sleep apnea     USES C-PAP   Discharge Diagnoses:   Principal Problem:   OA (osteoarthritis) of knee  Estimated body mass index is 39.46 kg/(m^2) as calculated from the following:   Height as of this encounter: 6' (1.829 m).   Weight as of this encounter: 131.997 kg (291 lb).  Procedure:  Procedure(s) (LRB): LEFT TOTAL KNEE ARTHROPLASTY (Left)   Consults: None  HPI: Rodney Hayes is a 62 y.o. year old male with end stage OA of his left knee with progressively worsening pain and dysfunction. He has constant pain, with activity and at rest and significant functional deficits with difficulties even with ADLs. He has had extensive non-op management including analgesics, injections of cortisone and viscosupplements, and home exercise program, but remains in significant pain with significant dysfunction. Radiographs show bone on bone arthritis medial and patellofemoral. He presents now for left Total Knee Arthroplasty.   Laboratory Data: Admission on 01/11/2013  Component Date Value Range Status  . ABO/RH(D) 01/11/2013 A POS   Final  . Antibody Screen 01/11/2013 NEG   Final  . Sample Expiration 01/11/2013 01/14/2013   Final  . Glucose-Capillary 01/11/2013 113* 70 - 99 mg/dL Final  . Comment 1 84/69/6295 Documented in Chart   Final  . ABO/RH(D) 01/11/2013 A POS   Final  . Glucose-Capillary 01/11/2013 122* 70 - 99 mg/dL Final  . Glucose-Capillary 01/11/2013 115* 70 - 99 mg/dL Final    . Glucose-Capillary 01/11/2013 126* 70 - 99 mg/dL Final  . WBC 28/41/3244 9.1  4.0 - 10.5 K/uL Final  . RBC 01/12/2013 3.68* 4.22 - 5.81 MIL/uL Final  . Hemoglobin 01/12/2013 11.4* 13.0 - 17.0 g/dL Final  . HCT 07/17/7251 33.9* 39.0 - 52.0 % Final  . MCV 01/12/2013 92.1  78.0 - 100.0 fL Final  . MCH 01/12/2013 31.0  26.0 - 34.0 pg Final  . MCHC 01/12/2013 33.6  30.0 - 36.0 g/dL Final  . RDW 66/44/0347 12.8  11.5 - 15.5 % Final  . Platelets 01/12/2013 182  150 - 400 K/uL Final  . Sodium 01/12/2013 138  135 - 145 mEq/L Final  . Potassium 01/12/2013 3.8  3.5 - 5.1 mEq/L Final  . Chloride 01/12/2013 98  96 - 112 mEq/L Final  . CO2 01/12/2013 33* 19 - 32 mEq/L Final  . Glucose, Bld 01/12/2013 125* 70 - 99 mg/dL Final  . BUN 42/59/5638 9  6 - 23 mg/dL Final  . Creatinine, Ser 01/12/2013 0.86  0.50 - 1.35 mg/dL Final  . Calcium 75/64/3329 8.8  8.4 - 10.5 mg/dL Final  . GFR calc non Af Amer 01/12/2013 90* >90 mL/min Final  . GFR calc Af Amer 01/12/2013 >90  >90 mL/min Final   Comment:                                 The eGFR has been calculated  using the CKD EPI equation.                          This calculation has not been                          validated in all clinical                          situations.                          eGFR's persistently                          <90 mL/min signify                          possible Chronic Kidney Disease.  . Glucose-Capillary 01/11/2013 114* 70 - 99 mg/dL Final  . Comment 1 16/04/9603 Documented in Chart   Final  . Comment 2 01/11/2013 Notify RN   Final  . Glucose-Capillary 01/11/2013 109* 70 - 99 mg/dL Final  . Glucose-Capillary 01/12/2013 170* 70 - 99 mg/dL Final  . Comment 1 54/03/8118 Notify RN   Final  . Comment 2 01/12/2013 Documented in Chart   Final  . Glucose-Capillary 01/12/2013 124* 70 - 99 mg/dL Final  . Glucose-Capillary 01/12/2013 142* 70 - 99 mg/dL Final  . Comment 1 14/78/2956 Notify RN    Final  . WBC 01/13/2013 13.7* 4.0 - 10.5 K/uL Final  . RBC 01/13/2013 3.72* 4.22 - 5.81 MIL/uL Final  . Hemoglobin 01/13/2013 11.5* 13.0 - 17.0 g/dL Final  . HCT 21/30/8657 34.1* 39.0 - 52.0 % Final  . MCV 01/13/2013 91.7  78.0 - 100.0 fL Final  . MCH 01/13/2013 30.9  26.0 - 34.0 pg Final  . MCHC 01/13/2013 33.7  30.0 - 36.0 g/dL Final  . RDW 84/69/6295 12.8  11.5 - 15.5 % Final  . Platelets 01/13/2013 205  150 - 400 K/uL Final  . Sodium 01/13/2013 138  135 - 145 mEq/L Final  . Potassium 01/13/2013 3.6  3.5 - 5.1 mEq/L Final  . Chloride 01/13/2013 97  96 - 112 mEq/L Final  . CO2 01/13/2013 34* 19 - 32 mEq/L Final  . Glucose, Bld 01/13/2013 143* 70 - 99 mg/dL Final  . BUN 28/41/3244 10  6 - 23 mg/dL Final  . Creatinine, Ser 01/13/2013 0.79  0.50 - 1.35 mg/dL Final  . Calcium 07/17/7251 9.6  8.4 - 10.5 mg/dL Final  . GFR calc non Af Amer 01/13/2013 >90  >90 mL/min Final  . GFR calc Af Amer 01/13/2013 >90  >90 mL/min Final   Comment:                                 The eGFR has been calculated                          using the CKD EPI equation.                          This calculation has not been  validated in all clinical                          situations.                          eGFR's persistently                          <90 mL/min signify                          possible Chronic Kidney Disease.  . Glucose-Capillary 01/12/2013 177* 70 - 99 mg/dL Final  . Comment 1 16/04/9603 Notify RN   Final  . Comment 2 01/12/2013 Documented in Chart   Final  . Glucose-Capillary 01/12/2013 168* 70 - 99 mg/dL Final  . Glucose-Capillary 01/13/2013 127* 70 - 99 mg/dL Final  Hospital Outpatient Visit on 01/06/2013  Component Date Value Range Status  . MRSA, PCR 01/06/2013 NEGATIVE  NEGATIVE Final  . Staphylococcus aureus 01/06/2013 NEGATIVE  NEGATIVE Final   Comment:                                 The Xpert SA Assay (FDA                          approved for  NASAL specimens                          in patients over 25 years of age),                          is one component of                          a comprehensive surveillance                          program.  Test performance has                          been validated by [employer] for patients greater                          than or equal to 97 year old.                          It is not intended                          to diagnose infection nor to                          guide or monitor treatment.  Marland Kitchen aPTT 01/06/2013 25  24 - 37 seconds Final  . WBC 01/06/2013 7.4  4.0 - 10.5 K/uL Final  . RBC 01/06/2013 4.55  4.22 - 5.81 MIL/uL Final  . Hemoglobin 01/06/2013 14.0  13.0 - 17.0 g/dL Final  . HCT 16/04/9603 41.1  39.0 - 52.0 % Final  . MCV 01/06/2013 90.3  78.0 - 100.0 fL Final  . MCH 01/06/2013 30.8  26.0 - 34.0 pg Final  . MCHC 01/06/2013 34.1  30.0 - 36.0 g/dL Final  . RDW 54/03/8118 12.6  11.5 - 15.5 % Final  . Platelets 01/06/2013 231  150 - 400 K/uL Final  . Sodium 01/06/2013 140  135 - 145 mEq/L Final  . Potassium 01/06/2013 3.8  3.5 - 5.1 mEq/L Final  . Chloride 01/06/2013 100  96 - 112 mEq/L Final  . CO2 01/06/2013 29  19 - 32 mEq/L Final  . Glucose, Bld 01/06/2013 108* 70 - 99 mg/dL Final  . BUN 14/78/2956 12  6 - 23 mg/dL Final  . Creatinine, Ser 01/06/2013 0.78  0.50 - 1.35 mg/dL Final  . Calcium 21/30/8657 9.7  8.4 - 10.5 mg/dL Final  . Total Protein 01/06/2013 7.8  6.0 - 8.3 g/dL Final  . Albumin 84/69/6295 4.1  3.5 - 5.2 g/dL Final  . AST 28/41/3244 24  0 - 37 U/L Final  . ALT 01/06/2013 28  0 - 53 U/L Final  . Alkaline Phosphatase 01/06/2013 67  39 - 117 U/L Final  . Total Bilirubin 01/06/2013 0.4  0.3 - 1.2 mg/dL Final  . GFR calc non Af Amer 01/06/2013 >90  >90 mL/min Final  . GFR calc Af Amer 01/06/2013 >90  >90 mL/min Final   Comment:                                 The eGFR has been calculated                          using  the CKD EPI equation.                          This calculation has not been                          validated in all clinical                          situations.                          eGFR's persistently                          <90 mL/min signify                          possible Chronic Kidney Disease.  Marland Kitchen Prothrombin Time 01/06/2013 12.1  11.6 - 15.2 seconds Final  . INR 01/06/2013 0.91  0.00 - 1.49 Final  . Color, Urine 01/06/2013 YELLOW  YELLOW Final  . APPearance 01/06/2013 CLEAR  CLEAR Final  . Specific Gravity, Urine 01/06/2013 1.020  1.005 - 1.030 Final  . pH 01/06/2013 6.0  5.0 - 8.0 Final  . Glucose, UA 01/06/2013 NEGATIVE  NEGATIVE mg/dL Final  . Hgb urine dipstick 01/06/2013 NEGATIVE  NEGATIVE Final  . Bilirubin Urine 01/06/2013 NEGATIVE  NEGATIVE Final  . Ketones, ur 01/06/2013 NEGATIVE  NEGATIVE mg/dL Final  . Protein, ur 07/17/7251 NEGATIVE  NEGATIVE mg/dL Final  . Urobilinogen, UA 01/06/2013 0.2  0.0 - 1.0 mg/dL Final  . Nitrite 16/04/9603 NEGATIVE  NEGATIVE Final  . Leukocytes, UA 01/06/2013 NEGATIVE  NEGATIVE Final   MICROSCOPIC NOT DONE ON URINES WITH NEGATIVE PROTEIN, BLOOD, LEUKOCYTES, NITRITE, OR GLUCOSE <1000 mg/dL.  Lab on 12/10/2012  Component Date Value Range Status  . Cholesterol 12/10/2012 162  0 - 200 mg/dL Final   ATP III Classification       Desirable:  < 200 mg/dL               Borderline High:  200 - 239 mg/dL          High:  > = 540 mg/dL  . Triglycerides 12/10/2012 125.0  0.0 - 149.0 mg/dL Final   Normal:  <981 mg/dLBorderline High:  150 - 199 mg/dL  . HDL 12/10/2012 43.90  >39.00 mg/dL Final  . VLDL 19/14/7829 25.0  0.0 - 40.0 mg/dL Final  . LDL Cholesterol 12/10/2012 93  0 - 99 mg/dL Final  . Total CHOL/HDL Ratio 12/10/2012 4   Final                  Men          Women1/2 Average Risk     3.4          3.3Average Risk          5.0          4.42X Average Risk          9.6          7.13X Average Risk          15.0          11.0                       . Hemoglobin A1C 12/10/2012 6.3  4.6 - 6.5 % Final   Glycemic Control Guidelines for People with Diabetes:Non Diabetic:  <6%Goal of Therapy: <7%Additional Action Suggested:  >8%      X-Rays:Dg Chest 2 View  01/06/2013   *RADIOLOGY REPORT*  Clinical Data: Preoperative evaluation for left knee replacement, history hypertension, diabetes, smoking  CHEST - 2 VIEW  Comparison: 12/17/2006  Findings: Upper-normal size of cardiac silhouette. Mediastinal contours and pulmonary vascularity normal. Lungs clear. No pleural effusion or pneumothorax. Minimal central peribronchial thickening, chronic. No acute osseous findings.  IMPRESSION: No acute abnormalities.   Original Report Authenticated By: Ulyses Southward, M.D.    EKG: Orders placed during the hospital encounter of 01/06/13  . EKG 12-LEAD  . EKG 12-LEAD     Hospital Course: Rodney Hayes is a 62 y.o. who was admitted to [facility]. They were brought to the operating room on 01/11/2013 and underwent Procedure(s): LEFT TOTAL KNEE ARTHROPLASTY.  Patient tolerated the procedure well and was later transferred to the recovery room and then to the orthopaedic floor for postoperative care.  They were given PO and IV analgesics for pain control following their surgery.  They were given 24 hours of postoperative antibiotics of  Anti-infectives   Start     Dose/Rate Route Frequency Ordered Stop   01/11/13 1430  ceFAZolin (ANCEF) IVPB 2 g/50 mL premix     2 g 100 mL/hr over 30 Minutes Intravenous Every 6 hours 01/11/13 1234 01/11/13 2053   01/11/13 0620  ceFAZolin (ANCEF) 3 g in dextrose 5 % 50 mL IVPB     3 g 160 mL/hr  over 30 Minutes Intravenous On call to O.R. 01/11/13 1308 01/11/13 0820     and started on DVT prophylaxis in the form of Xarelto.   PT and OT were ordered for total joint protocol.  Discharge planning consulted to help with postop disposition and equipment needs.  Patient had a tough night on the evening of surgery.  They started  to get up OOB with therapy on day one walking over 70 feet. Hemovac drain was pulled without difficulty.  Continued to work with therapy into day two.  Dressing was changed on day two and the incision was healing well.  Patient was seen in rounds and was ready to go home that same day.   Discharge Medications: Prior to Admission medications   Medication Sig Start Date End Date Taking? Authorizing Provider  amLODipine (NORVASC) 10 MG tablet Take 10 mg by mouth every morning.   Yes Historical Provider, MD  hydrochlorothiazide (HYDRODIURIL) 25 MG tablet Take 25 mg by mouth every morning.   Yes Historical Provider, MD  Liraglutide (VICTOZA) 18 MG/3ML SOPN Inject 1.8 mg into the skin every morning.   Yes Historical Provider, MD  losartan (COZAAR) 100 MG tablet Take 100 mg by mouth every morning.   Yes Historical Provider, MD  Saxagliptin-Metformin 2.11-998 MG TB24 Take 1 tablet by mouth 2 (two) times daily.   Yes Historical Provider, MD  tamsulosin (FLOMAX) 0.4 MG CAPS Take 0.4 mg by mouth every morning.   Yes Historical Provider, MD  glucose blood (ONE TOUCH ULTRA TEST) test strip TEST BLOOD GLUCOSE ONCE DAILY 10/13/12   Pecola Lawless, MD  Insulin Pen Needle (NOVOFINE) 32G X 6 MM MISC One injection daily as directed, DX 250.00 07/28/12   Pecola Lawless, MD  methocarbamol (ROBAXIN) 500 MG tablet Take 1 tablet (500 mg total) by mouth every 6 (six) hours as needed. 01/13/13   Kandice Schmelter Julien Girt, PA-C  ONETOUCH DELICA LANCETS 33G MISC TEST ONCE DAILY 08/03/12   Pecola Lawless, MD  oxyCODONE (OXY IR/ROXICODONE) 5 MG immediate release tablet Take 1-2 tablets (5-10 mg total) by mouth every 3 (three) hours as needed. 01/13/13   Kamea Dacosta Julien Girt, PA-C  rivaroxaban (XARELTO) 10 MG TABS tablet Take 1 tablet (10 mg total) by mouth daily with breakfast. Take Xarelto for two and a half more weeks, then discontinue Xarelto. 01/13/13   Clois Treanor, PA-C  traMADol (ULTRAM) 50 MG tablet Take 1-2 tablets (50-100  mg total) by mouth every 6 (six) hours as needed (mild pain). 01/13/13   Mykayla Brinton Julien Girt, PA-C    Diet: Cardiac diet and Diabetic diet Activity:WBAT Follow-up:in 2 weeks on July 15th Disposition - Home Discharged Condition: good       Discharge Orders   Future Appointments Provider Department Dept Phone   01/21/2013 10:15 AM [facility], PT [facility] [phone]   01/25/2013 11:45 AM Milana Obey, PT [facility] [phone]   01/26/2013 11:45 AM [facility], [facility] [phone]   01/28/2013 9:30 AM [facility], PT [facility] [phone]   02/01/2013 11:45 AM Milana Obey, PT [facility] [phone]   02/02/2013 11:45 AM [facility], PT [facility] [phone]   02/04/2013 11:45 AM [facility], PT [facility] [phone]   02/08/2013 11:45 AM Milana Obey, PT Outpatient Rehabilitation [facility] [phone]   02/09/2013 11:45 AM [facility], PT [facility] [phone]   02/11/2013 11:45 AM Lupita Leash  Elsie Lincoln, PT Outpatient Rehabilitation [facility] [phone]   02/15/2013 11:45 AM Milana Obey, PT Outpatient [facility] [phone]   02/16/2013 11:45 AM [facility], PT [facility] [phone]   02/18/2013 11:45 AM Milana Obey, PT Outpatient Rehabilitation [facility] [phone]   02/22/2013 11:45 AM Milana Obey, PT Outpatient Rehabilitation [facility] [phone]   02/23/2013 11:45 AM [facility], PT [facility] [phone]   02/25/2013 11:45 AM [facility], PT Outpatient [facility] [phone]   Future Orders Complete By Expires     Call MD / Call 911  As directed     Comments:      If you  experience chest pain or shortness of breath, CALL 911 and be transported to the hospital emergency room.  If you develope a fever above 101 F, pus (white drainage) or increased drainage or redness at the wound, or calf pain, call your surgeon's office.    Change dressing  As directed     Comments:      Change dressing daily with sterile 4 x 4 inch gauze dressing and apply TED hose. Do not submerge the incision under water.    Constipation Prevention  As directed     Comments:      Drink plenty of fluids.  Prune juice may be helpful.  You may use a stool softener, such as Colace (over the counter) 100 mg twice a day.  Use MiraLax (over the counter) for constipation as needed.    Diet - low sodium heart healthy  As directed     Diet Carb Modified  As directed     Discharge instructions  As directed     Comments:      Pick up stool softner and laxative for home. Do not submerge incision under water. May shower. Continue to use ice for pain and swelling from surgery.  Take Xarelto for two and a half more weeks, then discontinue Xarelto. Once the patient has completed the blood thinner regimen, then take a Baby 81 mg Aspirin daily for four more weeks.    Do not put a pillow under the knee. Place it under the heel.  As directed     Do not sit on low chairs, stoools or toilet seats, as it may be difficult to get up from low surfaces  As directed     Driving restrictions  As directed     Comments:      No driving until released by the physician.    Increase activity slowly as tolerated  As directed     Lifting restrictions  As directed     Comments:      No lifting until released by the physician.    Patient may shower  As directed     Comments:      You may shower without a dressing once there is no drainage.  Do not wash over the wound.  If drainage remains, do not shower until drainage stops.    TED hose  As directed     Comments:      Use stockings (TED hose) for 3 weeks on both leg(s).   You may remove them at night for sleeping.    Weight bearing as tolerated  As directed         Medication List    STOP taking these medications       ALEVE 220 MG tablet  Generic drug:  naproxen sodium  GLUCOSAMINE CHONDROITIN ADV PO     PENNSAID 1.5 % Soln  Generic drug:  Diclofenac Sodium      TAKE these medications       amLODipine 10 MG tablet  Commonly known as:  NORVASC  Take 10 mg by mouth every morning.     glucose blood test strip  Commonly known as:  ONE TOUCH ULTRA TEST  TEST BLOOD GLUCOSE ONCE DAILY     hydrochlorothiazide 25 MG tablet  Commonly known as:  HYDRODIURIL  Take 25 mg by mouth every morning.     Insulin Pen Needle 32G X 6 MM Misc  Commonly known as:  NOVOFINE  One injection daily as directed, DX 250.00     losartan 100 MG tablet  Commonly known as:  COZAAR  Take 100 mg by mouth every morning.     methocarbamol 500 MG tablet  Commonly known as:  ROBAXIN  Take 1 tablet (500 mg total) by mouth every 6 (six) hours as needed.     ONETOUCH DELICA LANCETS 33G Misc  TEST ONCE DAILY     oxyCODONE 5 MG immediate release tablet  Commonly known as:  Oxy IR/ROXICODONE  Take 1-2 tablets (5-10 mg total) by mouth every 3 (three) hours as needed.     rivaroxaban 10 MG Tabs tablet  Commonly known as:  XARELTO  Take 1 tablet (10 mg total) by mouth daily with breakfast. Take Xarelto for two and a half more weeks, then discontinue Xarelto.     Saxagliptin-Metformin 2.11-998 MG Tb24  Take 1 tablet by mouth 2 (two) times daily.     tamsulosin 0.4 MG Caps  Commonly known as:  FLOMAX  Take 0.4 mg by mouth every morning.     traMADol 50 MG tablet  Commonly known as:  ULTRAM  Take 1-2 tablets (50-100 mg total) by mouth every 6 (six) hours as needed (mild pain).     VICTOZA 18 MG/3ML Sopn  Generic drug:  Liraglutide  Inject 1.8 mg into the skin every morning.       Follow-up Information   Follow up with Loanne Drilling, MD. Schedule an  appointment as soon as possible for a visit on 01/26/2013.   Contact information:   [address], [address] [place] [address] [phone]       Signed: Patrica Duel 01/13/2013, 7:49 AM

## 2013-01-13 NOTE — Progress Notes (Signed)
   Subjective: 2 Days Post-Op Procedure(s) (LRB): LEFT TOTAL KNEE ARTHROPLASTY (Left) Patient reports pain as mild.   Patient seen in rounds with Dr. Lequita Halt.  Feels much better today. Patient is well, and has had no acute complaints or problems Patient is ready to go home.  Objective: Vital signs in last 24 hours: Temp:  [98.5 F (36.9 C)-99.3 F (37.4 C)] 98.5 F (36.9 C) (07/02 0500) Pulse Rate:  [69-93] 69 (07/02 0500) Resp:  [16] 16 (07/02 0500) BP: (134-181)/(70-82) 135/70 mmHg (07/02 0500) SpO2:  [92 %-98 %] 98 % (07/02 0500)  Intake/Output from previous day:  Intake/Output Summary (Last 24 hours) at 01/13/13 0742 Last data filed at 01/13/13 1610  Gross per 24 hour  Intake    940 ml  Output   1450 ml  Net   -510 ml    Intake/Output this shift:    Labs:  Recent Labs  01/12/13 0523 01/13/13 0505  HGB 11.4* 11.5*    Recent Labs  01/12/13 0523 01/13/13 0505  WBC 9.1 13.7*  RBC 3.68* 3.72*  HCT 33.9* 34.1*  PLT 182 205    Recent Labs  01/12/13 0523 01/13/13 0505  NA 138 138  K 3.8 3.6  CL 98 97  CO2 33* 34*  BUN 9 10  CREATININE 0.86 0.79  GLUCOSE 125* 143*  CALCIUM 8.8 9.6   No results found for this basename: LABPT, INR,  in the last 72 hours  EXAM: General - Patient is Alert, Appropriate and Oriented Extremity - Neurovascular intact Sensation intact distally Dorsiflexion/Plantar flexion intact No cellulitis present Incision - clean, dry, no drainage, healing Motor Function - intact, moving foot and toes well on exam.   Assessment/Plan: 2 Days Post-Op Procedure(s) (LRB): LEFT TOTAL KNEE ARTHROPLASTY (Left) Procedure(s) (LRB): LEFT TOTAL KNEE ARTHROPLASTY (Left) Past Medical History  Diagnosis Date  . Hyperlipidemia   . Hypertension   . Diabetes mellitus   . Obesity   . Nonspecific abnormal electrocardiogram (ECG) (EKG)   . Asthma     AS CHILD  . Arthritis   . DJD (degenerative joint disease)   . BPH (benign prostatic  hyperplasia)   . Sleep apnea     USES C-PAP   Principal Problem:   OA (osteoarthritis) of knee  Estimated body mass index is 39.46 kg/(m^2) as calculated from the following:   Height as of this encounter: 6' (1.829 m).   Weight as of this encounter: 131.997 kg (291 lb). Up with therapy Discharge home with home health Diet - Cardiac diet, Diabetic diet Follow up - in 2 weeks Activity - WBAT Disposition - Home Condition Upon Discharge - Good D/C Meds - See DC Summary DVT Prophylaxis - Xarelto  Shyne Lehrke 01/13/2013, 7:42 AM

## 2013-01-13 NOTE — Discharge Instructions (Signed)
° °Dr. Frank Aluisio °Total Joint Specialist °[facility] °[address] °[place], [place] [address] °[phone] ° °TOTAL KNEE REPLACEMENT POSTOPERATIVE DIRECTIONS ° ° ° °Knee Rehabilitation, Guidelines Following Surgery  °Results after knee surgery are often greatly improved when you follow the exercise, range of motion and muscle strengthening exercises prescribed by your doctor. Safety measures are also important to protect the knee from further injury. Any time any of these exercises cause you to have increased pain or swelling in your knee joint, decrease the amount until you are comfortable again and slowly increase them. If you have problems or questions, call your caregiver or physical therapist for advice.  ° °HOME CARE INSTRUCTIONS  °Remove items at home which could result in a fall. This includes throw rugs or furniture in walking pathways.  °Continue medications as instructed at time of discharge. °You may have some home medications which will be placed on hold until you complete the course of blood thinner medication.  °You may start showering once you are discharged home but do not submerge the incision under water. Just pat the incision dry and apply a dry gauze dressing on daily. °Walk with walker as instructed.  °You may resume a sexual relationship in one month or when given the OK by  your doctor.  °· Use walker as long as suggested by your caregivers. °· Avoid periods of inactivity such as sitting longer than an hour when not asleep. This helps prevent blood clots.  °You may put full weight on your legs and walk as much as is comfortable.  °You may return to work once you are cleared by your doctor.  °Do not drive a car for 6 weeks or until released by you surgeon.  °· Do not drive while taking narcotics.  °Wear the elastic stockings for three weeks following surgery during the day but you may remove then at night. °Make sure you keep all of your appointments after your  operation with all of your doctors and caregivers. You should call the office at the above phone number and make an appointment for approximately two weeks after the date of your surgery. °Change the dressing daily and reapply a dry dressing each time. °Please pick up a stool softener and laxative for home use as long as you are requiring pain medications. °· Continue to use ice on the knee for pain and swelling from surgery. You may notice swelling that will progress down to the foot and ankle.  This is normal after surgery.  Elevate the leg when you are not up walking on it.   °It is important for you to complete the blood thinner medication as prescribed by your doctor. °· Continue to use the breathing machine which will help keep your temperature down.  It is common for your temperature to cycle up and down following surgery, especially at night when you are not up moving around and exerting yourself.  The breathing machine keeps your lungs expanded and your temperature down. ° °RANGE OF MOTION AND STRENGTHENING EXERCISES  °Rehabilitation of the knee is important following a knee injury or an operation. After just a few days of immobilization, the muscles of the thigh which control the knee become weakened and shrink (atrophy). Knee exercises are designed to build up the tone and strength of the thigh muscles and to improve knee motion. Often times heat used for twenty to thirty minutes before working out will loosen up your tissues and help with improving the   range of motion but do not use heat for the first two weeks following surgery. These exercises can be done on a training (exercise) mat, on the floor, on a table or on a bed. Use what ever works the best and is most comfortable for you Knee exercises include:  °Leg Lifts - While your knee is still immobilized in a splint or cast, you can do straight leg raises. Lift the leg to 60 degrees, hold for 3 sec, and slowly lower the leg. Repeat 10-20 times 2-3  times daily. Perform this exercise against resistance later as your knee gets better.  °Quad and Hamstring Sets - Tighten up the muscle on the front of the thigh (Quad) and hold for 5-10 sec. Repeat this 10-20 times hourly. Hamstring sets are done by pushing the foot backward against an object and holding for 5-10 sec. Repeat as with quad sets.  °A rehabilitation program following serious knee injuries can speed recovery and prevent re-injury in the future due to weakened muscles. Contact your doctor or a physical therapist for more information on knee rehabilitation.  ° °SKILLED REHAB INSTRUCTIONS: °If the patient is transferred to a skilled rehab facility following release from the hospital, a list of the current medications will be sent to the facility for the patient to continue.  When discharged from the skilled rehab facility, please have the facility set up the patient's Home Health Physical Therapy prior to being released. Also, the skilled facility will be responsible for providing the patient with their medications at time of release from the facility to include their pain medication, the muscle relaxants, and their blood thinner medication. If the patient is still at the rehab facility at time of the two week follow up appointment, the skilled rehab facility will also need to assist the patient in arranging follow up appointment in our office and any transportation needs. ° °MAKE SURE YOU:  °Understand these instructions.  °Will watch your condition.  °Will get help right away if you are not doing well or get worse.  ° ° °Pick up stool softner and laxative for home. °Do not submerge incision under water. °May shower. °Continue to use ice for pain and swelling from surgery. ° °Take Xarelto for two and a half more weeks, then discontinue Xarelto. °Once the patient has completed the blood thinner regimen, then take a Baby 81 mg Aspirin daily for four more weeks. ° ° ° ° ° ° ° ° ° °

## 2013-01-13 NOTE — Progress Notes (Signed)
RT went to patients room to see if he needed anything for his CPAP. Patient said he was fine and thanked me for coming by.

## 2013-01-13 NOTE — Progress Notes (Signed)
Patient called for help with his CPAP. Went to the floor and RN stated that he already had it on and did not want to be disturbed.

## 2013-01-14 ENCOUNTER — Encounter: Payer: Self-pay | Admitting: Internal Medicine

## 2013-01-14 MED ORDER — SAXAGLIPTIN-METFORMIN ER 2.5-1000 MG PO TB24: ORAL_TABLET | ORAL | Status: DC

## 2013-01-14 NOTE — Telephone Encounter (Signed)
Per Dr.Hopper patient to decrease DM medication to 1 by mouth daily due to A1c of 6.3

## 2013-01-14 NOTE — Progress Notes (Signed)
Discharge summary sent to payer through [facility]  

## 2013-01-21 ENCOUNTER — Ambulatory Visit: Payer: 59 | Attending: Orthopedic Surgery | Admitting: Rehabilitation

## 2013-01-21 DIAGNOSIS — R262 Difficulty in walking, not elsewhere classified: Secondary | ICD-10-CM | POA: Insufficient documentation

## 2013-01-21 DIAGNOSIS — M25569 Pain in unspecified knee: Secondary | ICD-10-CM | POA: Insufficient documentation

## 2013-01-21 DIAGNOSIS — Z96659 Presence of unspecified artificial knee joint: Secondary | ICD-10-CM | POA: Insufficient documentation

## 2013-01-21 DIAGNOSIS — IMO0001 Reserved for inherently not codable concepts without codable children: Secondary | ICD-10-CM | POA: Insufficient documentation

## 2013-01-21 DIAGNOSIS — M25669 Stiffness of unspecified knee, not elsewhere classified: Secondary | ICD-10-CM | POA: Insufficient documentation

## 2013-01-21 DIAGNOSIS — R609 Edema, unspecified: Secondary | ICD-10-CM | POA: Insufficient documentation

## 2013-01-25 ENCOUNTER — Ambulatory Visit: Payer: 59 | Admitting: Physical Therapy

## 2013-01-26 ENCOUNTER — Ambulatory Visit: Payer: 59 | Admitting: Rehabilitation

## 2013-01-28 ENCOUNTER — Ambulatory Visit: Payer: 59 | Admitting: Rehabilitation

## 2013-02-01 ENCOUNTER — Ambulatory Visit: Payer: 59 | Admitting: Physical Therapy

## 2013-02-02 ENCOUNTER — Ambulatory Visit: Payer: 59 | Admitting: Rehabilitation

## 2013-02-04 ENCOUNTER — Ambulatory Visit: Payer: 59 | Admitting: Rehabilitation

## 2013-02-08 ENCOUNTER — Ambulatory Visit: Payer: 59 | Admitting: Physical Therapy

## 2013-02-09 ENCOUNTER — Ambulatory Visit: Payer: 59 | Admitting: Rehabilitation

## 2013-02-11 ENCOUNTER — Ambulatory Visit: Payer: 59 | Admitting: Rehabilitation

## 2013-02-15 ENCOUNTER — Other Ambulatory Visit: Payer: Self-pay | Admitting: Internal Medicine

## 2013-02-15 ENCOUNTER — Ambulatory Visit: Payer: 59 | Attending: Rehabilitation | Admitting: Physical Therapy

## 2013-02-15 DIAGNOSIS — M25669 Stiffness of unspecified knee, not elsewhere classified: Secondary | ICD-10-CM | POA: Insufficient documentation

## 2013-02-15 DIAGNOSIS — R609 Edema, unspecified: Secondary | ICD-10-CM | POA: Insufficient documentation

## 2013-02-15 DIAGNOSIS — IMO0001 Reserved for inherently not codable concepts without codable children: Secondary | ICD-10-CM | POA: Insufficient documentation

## 2013-02-15 DIAGNOSIS — R262 Difficulty in walking, not elsewhere classified: Secondary | ICD-10-CM | POA: Insufficient documentation

## 2013-02-15 DIAGNOSIS — M25569 Pain in unspecified knee: Secondary | ICD-10-CM | POA: Insufficient documentation

## 2013-02-15 DIAGNOSIS — Z96659 Presence of unspecified artificial knee joint: Secondary | ICD-10-CM | POA: Insufficient documentation

## 2013-02-16 ENCOUNTER — Ambulatory Visit: Payer: 59 | Admitting: Rehabilitation

## 2013-02-18 ENCOUNTER — Ambulatory Visit: Payer: 59 | Admitting: Physical Therapy

## 2013-02-22 ENCOUNTER — Ambulatory Visit: Payer: 59 | Admitting: Physical Therapy

## 2013-02-23 ENCOUNTER — Ambulatory Visit: Payer: 59 | Admitting: Rehabilitation

## 2013-02-25 ENCOUNTER — Ambulatory Visit: Payer: 59 | Admitting: Rehabilitation

## 2013-03-01 ENCOUNTER — Ambulatory Visit: Payer: 59 | Admitting: Physical Therapy

## 2013-03-02 ENCOUNTER — Ambulatory Visit: Payer: 59 | Admitting: Rehabilitation

## 2013-03-04 ENCOUNTER — Ambulatory Visit: Payer: 59 | Admitting: Rehabilitation

## 2013-03-08 ENCOUNTER — Ambulatory Visit: Payer: 59 | Admitting: Physical Therapy

## 2013-03-09 ENCOUNTER — Ambulatory Visit: Payer: 59 | Admitting: Rehabilitation

## 2013-03-10 ENCOUNTER — Ambulatory Visit (INDEPENDENT_AMBULATORY_CARE_PROVIDER_SITE_OTHER): Payer: Self-pay | Admitting: Family Medicine

## 2013-03-10 VITALS — BP 130/74 | Wt 274.0 lb

## 2013-03-10 DIAGNOSIS — E119 Type 2 diabetes mellitus without complications: Secondary | ICD-10-CM

## 2013-03-11 ENCOUNTER — Encounter: Payer: 59 | Admitting: Physical Therapy

## 2013-03-11 NOTE — Progress Notes (Signed)
Patient presents today for 3 month diabetes follow-up as part of the employer-sponsored Link to Wellness program. Patient is s/p knee replacement and doing well. Current diabetes regimen includes Victoza and Kombiglyze. Patient also continues on daily ARB. Most recent MD follow-up was back in January. Patient has a pending appt for yearly follow-up. No med changes at this time.  Diabetes Assessment: Type of Diabetes: Type 2; MD managing Diabetes Hopper; checks blood glucose once daily; takes medications as prescribed; checks feet daily; does not take an aspirin a day; Highest CBG 188; Lowest CBG 120; hypoglycemia frequency None; Sees Diabetes provider 1 time a year; A1c 6.3  Other Diabetes History: Current med regimen includes kombiglyze XR 2.11/998 mg daily and victoza 1.8 mg daily. Patient does maintain good medication compliance. Patient did not bring meter today but did bring glucose log and is currently testing 1 time per day, fasting. Glucose monitoring occurs fasting and when symptomatic. Hypoglycemia frequency is rare. Fasting glucose ranges from 90-120. If patient continues with weight loss and well controlled glucose, it may be possible to further reduce diabetes medication regimen. Patient does demonstrate appropriate correction of hypoglycemia. Patient denies signs and symptoms of neuropathy including numbness/tingling/burning and symptoms of foot infection. Patient is due for yearly eye exam and has an appt scheduled for October.   Lifestyle Factors: Diet - Patient has had significant weight loss since knee replacement. Appetite was much less after the surgery and patient has tried to take advantage of this. He is now eating smaller portion sizes and is making healthier choices as well.  Exercise - Continues exercising regularly, 45-60 min at a time. Exercises include strength training, cardio, core strengthening, and most recently pilates. Patient was dismissed from physical therapy last week,  but was attending these sessions three times weekly in addition to walking daily and exercising daily at the gym.   Assessment: Patient returns today under good diabetic control. Did not draw A1c as MD did this in May with at-goal A1c result of 6.3 (prev 7.5). Fasting glucose has improved and is now between 90-120. Patient underwent recent knee replacement but is recovering well and continues doing a great job maintaining exercise regimen. He has also made great strides in controlling diet and has lost 24 lbs.   Plan: 1) Continue to make healthy dietary choices 2) Continue to exercise regularly, great job of continuing this throughout TKR recovery 3) Continue to test regularly 4) Follow-up in 3 months

## 2013-05-03 ENCOUNTER — Other Ambulatory Visit: Payer: Self-pay | Admitting: Internal Medicine

## 2013-05-03 NOTE — Telephone Encounter (Signed)
Saxagliptin-Metformin 2.11-998 MG TB24 refill sent to pharmacy

## 2013-05-07 ENCOUNTER — Telehealth: Payer: Self-pay | Admitting: Internal Medicine

## 2013-05-07 MED ORDER — ONETOUCH ULTRA MINI W/DEVICE KIT: PACK | Status: DC

## 2013-05-07 NOTE — Telephone Encounter (Signed)
[place]. He wants an rx sent to [facility] so that he can get another. Please advise.

## 2013-05-07 NOTE — Telephone Encounter (Signed)
Glucometer refilled

## 2013-05-20 ENCOUNTER — Other Ambulatory Visit: Payer: Self-pay

## 2013-06-09 ENCOUNTER — Ambulatory Visit (INDEPENDENT_AMBULATORY_CARE_PROVIDER_SITE_OTHER): Payer: Self-pay | Admitting: Family Medicine

## 2013-06-09 VITALS — BP 156/82 | Wt 289.0 lb

## 2013-06-09 DIAGNOSIS — E119 Type 2 diabetes mellitus without complications: Secondary | ICD-10-CM

## 2013-06-09 NOTE — Progress Notes (Signed)
Patient presents today for 3 month diabetes follow-up as part of the employer-sponsored Link to Wellness program. Current diabetes regimen includes Kombiglyze and Victoza. Patient also continues on daily ARB. Most recent MD follow-up was Jan 2014. Patient has a pending appt for Jan 2015. No med changes or major health changes at this time.   Diabetes Assessment: Type of Diabetes: Type 2; MD managing Diabetes Hopper; checks blood glucose once daily; uses glucometer; takes medications as prescribed; checks feet daily; does not take an aspirin a day; Highest CBG 188; Lowest CBG 120; hypoglycemia frequency None; Sees Diabetes provider 1 time a year Other Diabetes History: Current med regimen includes kombiglyze XR 2.11/998 mg daily and victoza 1.8 mg daily. Patient does maintain good medication compliance. Patient did not bring meter today but continues testing 1 time per day, fasting. Glucose runs 106-120, and was 116 this AM. Highest 164 after eating PB sandwich late one night. No hypoglycemia. Glucose monitoring occurs fasting and when symptomatic. Patient does demonstrate appropriate correction of hypoglycemia. Patient denies signs and symptoms of neuropathy including numbness/tingling/burning and symptoms of foot infection. Patient is now up to date on yearly eye exam and had an appt this past October. Right cataract remains unchanged, and no signs of diabetic retinopathy.   Lifestyle Factors: Diet - Patient has experienced weight gain since returning to work. He once again admits that he is a stress eater and has allowed portions to become uncontrolled due to work-related stress. Patient is aware of how to eat a healthy diet but sometimes gets lazy in his efforts to maintain this.  Exercise - still participating in pilates two days per week and cardio 2-3 days per week. Continues exercising regularly, 45-60 min at a time. Exercises include strength training, cardio, core strengthening, and  pilates.  Assessment: Patient returns today under good diabetic control. Did not draw A1c as MD did this in May with at-goal A1c result of 6.3 (prev 7.5). Fasting glucose continues to be between 100-120. Patient has now returned to work after recent knee replacement and as a result is under a great deal of stress which has caused diet to deteriorate. Paitent will work to better manage portion control. He continues doing a great job maintaining exercise regimen. Patient will return in 3 months.  Plan: 1) Focus on portion control, especially during the holidays 2) Maintain daily exercise 3) Continue testing regularly 4) Follow-up in 3 months on Wednesday Feb 25th @ 3:30 pm

## 2013-06-21 ENCOUNTER — Encounter: Payer: Self-pay | Admitting: Internal Medicine

## 2013-06-24 ENCOUNTER — Other Ambulatory Visit: Payer: Self-pay | Admitting: Internal Medicine

## 2013-06-28 NOTE — Telephone Encounter (Signed)
Amlodipine refilled per protocol. OV due. JG//CMA 

## 2013-07-19 ENCOUNTER — Other Ambulatory Visit: Payer: Self-pay | Admitting: Internal Medicine

## 2013-07-19 NOTE — Telephone Encounter (Signed)
Tamsulosin refilled per protocol. JG//CMA 

## 2013-07-26 NOTE — Progress Notes (Signed)
Patient ID: Rodney Hayes, male   DOB: 09/07/1950, 62 y.o.   MRN: [id number] ATTENDING PHYSICIAN NOTE: I have reviewed the chart and agree with the plan as detailed above. Fatisha Rabalais MD Pager [phone]  

## 2013-07-26 NOTE — Progress Notes (Signed)
Patient ID: Rodney Hayes, male   DOB: 05-07-1951, 63 y.o.   MRN: [id number] ATTENDING PHYSICIAN NOTE: I have reviewed the chart and agree with the plan as detailed above. Dorcas Mcmurray MD Pager [phone]

## 2013-07-29 ENCOUNTER — Telehealth: Payer: Self-pay

## 2013-07-29 DIAGNOSIS — E785 Hyperlipidemia, unspecified: Secondary | ICD-10-CM

## 2013-07-29 DIAGNOSIS — Z0000 Encounter for general adult medical examination without abnormal findings: Secondary | ICD-10-CM

## 2013-07-29 NOTE — Telephone Encounter (Signed)
Medication List and allergies:  Reviewed and updated  90 day supply/mail order: na Local prescriptions: [facility]  Immunizations due: Shingles  A/P:   No changes to FH or PSH or personal Hx Flu vacine--04/2013 Tdap-- < 10 years [facility]--06/2009//Dr Gessner--next due 2015 PSA--04/2009--2.91 Lab orders entered including PSA  To Discuss with Provider: Not at this time

## 2013-07-29 NOTE — Addendum Note (Signed)
Addended by: Reino Bellis on: 07/29/2013 04:42 PM   Modules accepted: Orders

## 2013-07-30 ENCOUNTER — Encounter: Payer: Self-pay | Admitting: Internal Medicine

## 2013-07-30 ENCOUNTER — Ambulatory Visit (INDEPENDENT_AMBULATORY_CARE_PROVIDER_SITE_OTHER): Payer: 59 | Admitting: Internal Medicine

## 2013-07-30 VITALS — BP 165/72 | HR 70 | Temp 99.1°F | Ht 70.5 in | Wt 307.2 lb

## 2013-07-30 DIAGNOSIS — E119 Type 2 diabetes mellitus without complications: Secondary | ICD-10-CM

## 2013-07-30 DIAGNOSIS — I10 Essential (primary) hypertension: Secondary | ICD-10-CM

## 2013-07-30 DIAGNOSIS — E785 Hyperlipidemia, unspecified: Secondary | ICD-10-CM

## 2013-07-30 DIAGNOSIS — Z0000 Encounter for general adult medical examination without abnormal findings: Secondary | ICD-10-CM

## 2013-07-30 NOTE — Patient Instructions (Signed)
Your next office appointment will be determined based upon review of your pending labs. Those instructions will be transmitted to you through My Chart . 

## 2013-07-30 NOTE — Progress Notes (Signed)
Pre visit review using our clinic review tool, if applicable. No additional management support is needed unless otherwise documented below in the visit note. 

## 2013-07-30 NOTE — Progress Notes (Signed)
   Subjective:    Patient ID: Rodney Hayes, male    DOB: April 26, 1951, 63 y.o.   MRN: [id number]  HPI  He is here for a physical;acute issues include arthralgias of shoulder & R ankle, sites of prior injury.     Review of Systems HYPERTENSION: Disease Monitoring: Blood pressure range-140/74-150/90 Chest pain, palpitations- no       Dyspnea- on treadmill with pulse 150 Medications: Compliance- yes  Lightheadedness,Syncope- no   Edema- [place]  DM:  Polyuria/phagia/dipsia- no       Visual problems- no FBS - 104-140 2 hr post meal - <160 Paresthesias-no Nonhealing lesions-no  HYPERLIPIDEMIA: Disease Monitoring: See symptoms for Hypertension Medications: Compliance- no due to PMH of Hep B   Abd pain, bowel changes- no   Muscle aches- no     Objective:   Physical Exam  Gen.:  well-nourished in appearance; weight excess. Alert, appropriate and cooperative throughout exam.Appears younger than stated age  Head: Normocephalic without obvious abnormalities; no alopecia . Moustache Eyes: No corneal or conjunctival inflammation noted. Pupils equal round reactive to light and accommodation. Extraocular motion intact.  Ears: External  ear exam reveals no significant lesions or deformities. Canals clear .TMs normal. Hearing is grosslydecreased bilaterally. Nose: External nasal exam reveals no deformity or inflammation. Nasal mucosa are pink and moist. No lesions or exudates noted.   Mouth: Oral mucosa and oropharynx reveal no lesions or exudates. Teeth in good repair. Neck: No deformities, masses, or tenderness noted. Range of motion & Thyroid normal. Lungs: Normal respiratory effort; chest expands symmetrically. Lungs are clear to auscultation without rales, wheezes, or increased work of breathing. Heart: Normal rate and rhythm. Normal S1 and S2. No gallop, click, or rub. S4 w/o murmur. Abdomen: Bowel sounds normal; abdomen soft and nontender. No masses, organomegaly or hernias  noted. Genitalia: Genitalia normal except for left varices. Prostate is normal without enlargement, asymmetry, nodularity, or induration                                  Musculoskeletal/extremities: No deformity or scoliosis noted of  the thoracic or lumbar spine.   No clubbing, cyanosis, edema, or significant extremity  deformity noted. Range of motion normal except L knee.Tone & strength normal. Hand joints  reveal minimal  DJD DIP changes. Fingernail  health good. Able to lie down & sit up w/o help. Negative SLR bilaterally Vascular: Carotid, radial artery, dorsalis pedis and  posterior tibial pulses are full and equal. No bruits present. Neurologic: Alert and oriented x3. Deep tendon reflexes symmetrical and normal.  Light touch normal over feet.     Skin: Intact without suspicious lesions or rashes. Lymph: No cervical, axillary, or inguinal lymphadenopathy present. Psych: Mood and affect are normal. Normally interactive                                                                                         Assessment & Plan:  #1 comprehensive physical exam; no acute findings  Plan: see Orders  & Recommendations

## 2013-07-31 ENCOUNTER — Telehealth: Payer: Self-pay | Admitting: Internal Medicine

## 2013-07-31 NOTE — Telephone Encounter (Signed)
Relevant patient education assigned to patient using Emmi. ° °

## 2013-08-02 ENCOUNTER — Other Ambulatory Visit (INDEPENDENT_AMBULATORY_CARE_PROVIDER_SITE_OTHER): Payer: 59

## 2013-08-02 DIAGNOSIS — Z0000 Encounter for general adult medical examination without abnormal findings: Secondary | ICD-10-CM

## 2013-08-02 LAB — MICROALBUMIN / CREATININE URINE RATIO
Creatinine,U: 49.9 mg/dL
Microalb Creat Ratio: 3 mg/g (ref 0.0–30.0)
Microalb, Ur: 1.5 mg/dL (ref 0.0–1.9)

## 2013-08-02 LAB — HEMOGLOBIN A1C: HEMOGLOBIN A1C: 6.3 % (ref 4.6–6.5)

## 2013-08-23 ENCOUNTER — Other Ambulatory Visit: Payer: Self-pay | Admitting: Internal Medicine

## 2013-08-24 NOTE — Telephone Encounter (Signed)
Rx's sent to the pharmacy by e-script.//AB/CMA 

## 2013-08-24 NOTE — Telephone Encounter (Signed)
Med filled.  

## 2013-09-13 ENCOUNTER — Other Ambulatory Visit: Payer: Self-pay | Admitting: Internal Medicine

## 2013-09-22 ENCOUNTER — Other Ambulatory Visit: Payer: Self-pay | Admitting: Chiropractic Medicine

## 2013-09-22 ENCOUNTER — Ambulatory Visit
Admission: RE | Admit: 2013-09-22 | Discharge: 2013-09-22 | Disposition: A | Discharge status: Home / Self Care | Payer: 59 | Source: Ambulatory Visit | Attending: Chiropractic Medicine | Admitting: Chiropractic Medicine

## 2013-09-22 DIAGNOSIS — M25512 Pain in left shoulder: Secondary | ICD-10-CM

## 2013-09-30 ENCOUNTER — Telehealth: Payer: Self-pay | Admitting: Internal Medicine

## 2013-09-30 MED ORDER — AMLODIPINE BESYLATE 10 MG PO TABS: 10.0000 mg | ORAL_TABLET | Freq: Every morning | ORAL | Status: DC

## 2013-09-30 NOTE — Telephone Encounter (Signed)
Patient called stating he needs [facility]. He states they sent [place] a request on Monday, however I do see that we received it.

## 2013-09-30 NOTE — Telephone Encounter (Signed)
Amlodipine has been sent back to [facility]...Rodney Hayes

## 2013-10-06 ENCOUNTER — Ambulatory Visit (INDEPENDENT_AMBULATORY_CARE_PROVIDER_SITE_OTHER): Payer: Self-pay | Admitting: Family Medicine

## 2013-10-06 VITALS — BP 142/86 | Wt 310.0 lb

## 2013-10-06 DIAGNOSIS — E119 Type 2 diabetes mellitus without complications: Secondary | ICD-10-CM

## 2013-10-07 NOTE — Progress Notes (Signed)
Patient presents today for 3 month diabetes follow-up as part of the employer-sponsored Link to Wellness program. Current diabetes regimen includes Kombiglyze (saxagliptin and Metformin) and Victoza. Patient also continues on daily ARB. Most recent MD follow-up was January 2015. Patient has a pending appt for yearly exam. No med changes or major health changes at this time.   Diabetes Assessment: Type of Diabetes: Type 2; MD managing Diabetes Hopper; checks blood glucose once daily; uses glucometer; takes medications as prescribed; checks feet daily; does not take an aspirin a day; Lowest CBG 120; hypoglycemia frequency None; Sees Diabetes provider 1 time a year; Highest CBG 196; A1c 6.3 (Jan 2015) Other Diabetes History: Current med regimen includes kombiglyze XR 2.11/998 mg daily and victoza 1.8 mg daily. Patient does maintain good medication compliance. Patient did not bring meter today but continues testing 1 time per day, fasting. Glucose runs 130-149, and was 141 this AM. Highest 196 after eating a late night snack. No hypoglycemia. Glucose monitoring occurs fasting and when symptomatic. Patient does demonstrate appropriate correction of hypoglycemia. Patient denies signs and symptoms of neuropathy including numbness/tingling/burning and symptoms of foot infection. Patient is now up to date on yearly eye exam and had an appt this past October. Right cataract remains unchanged, and no signs of diabetic retinopathy.   Lifestyle Factors: Diet - Patient has experienced weight gain since returning to work. He once again admits that he is a stress eater and has allowed portions to become uncontrolled due to work-related stress. Patient is aware of how to eat a healthy diet but sometimes gets lazy in his efforts to maintain this. Patient also believes he struggles with a food addiction and is clearly frustrated with this at today's visit.  Exercise - Continues participating in pilates two days per week and  cardio 4-5 days per week. Continues exercising regularly, 45-60 min at a time. Exercises include strength training, cardio, core strengthening, and pilates.   Assessment: Patient returns today under good diabetic control. Did not draw A1c as MD did this in Jan with at-goal A1c result of 6.3. Patient has now returned to work after recent knee replacement and as a result is under a great deal of stress which has caused diet to deteriorate. Paitent will work to better manage portion control and make better choices. He continues doing a great job maintaining exercise regimen. Patient will return in 3 months.  Plan: 1) Continue to work toward a healthy diet and monitor portion sizes and healthy choices 2) Continue exercising regularly, great job maintaining this! 3) Log in to [employer] healthy rewards 4) Continue to test glucose regularly 5) Follow-up in 3 months on Wednesday July 1st at 3:30 pm

## 2013-11-22 ENCOUNTER — Other Ambulatory Visit: Payer: Self-pay | Admitting: Internal Medicine

## 2014-01-12 ENCOUNTER — Ambulatory Visit (INDEPENDENT_AMBULATORY_CARE_PROVIDER_SITE_OTHER): Payer: Self-pay | Admitting: Family Medicine

## 2014-01-12 VITALS — BP 140/80 | Wt 305.0 lb

## 2014-01-12 DIAGNOSIS — E119 Type 2 diabetes mellitus without complications: Secondary | ICD-10-CM

## 2014-01-12 NOTE — Progress Notes (Signed)
Patient presents today for 3 month diabetes follow-up as part of the employer-sponsored Link to Wellness program. Current diabetes regimen includes Kombiglyze (saxagliptin and Metformin) and Victoza. Patient also continues on daily ARB. Most recent MD follow-up was January 2015. Patient has a pending appt for yearly exam. No med changes or major health changes at this time. Patient had 1 year follow-up with Dr. Maureen Ralphs for TKR. He was given a good report and knee is healing appropriately.  Diabetes Assessment: Type of Diabetes: Type 2; MD managing Diabetes Hopper; checks blood glucose once daily; uses glucometer; takes medications as prescribed; checks feet daily; does not take an aspirin a day; Lowest CBG 120; hypoglycemia frequency None; Sees Diabetes provider 1 time a year; Highest CBG 196; A1c 7.0 today (6.3 in January) Other Diabetes History: Current med regimen includes kombiglyze XR 2.11/998 mg daily and victoza 1.8 mg daily. Patient does maintain good medication compliance. Patient did not bring meter today but continues testing 1 time per day, fasting. Glucose runs 130-140s on average. No hypoglycemia. Glucose monitoring occurs fasting and when symptomatic. Patient does demonstrate appropriate correction of hypoglycemia. Patient denies signs and symptoms of neuropathy including numbness/tingling/burning and symptoms of foot infection. Patient is now up to date on yearly eye exam and had an appt this past October. Right cataract remains unchanged, and no signs of diabetic retinopathy.   Lifestyle Factors: Diet - Patient continues to struggle with diet. He attempts to maintain a healthy diet and control portion sizes, but admits that this is a struggle and he just enjoys eating. He is aware of what to do, but it is a challenge to follow through with a healthy diet plan. Patient will continue to work on dietary goals.  Exercise - Exercise has increased in intensity and duration as knee continues to  heal. He is now exercising daily and twice daily four days per week. He does pilates twice weekly and cardio 4-5 days per week. Exercises include strength training, cardio, core strengthening, and pilates. He is now also able to use stairs at work since knee is healing and no longer as painful. Pt wears a fit bit, which helps him track physical activity.   Assessment: Patient returns today under good diabetic control; however, A1c has increased over previous 6 months. A1c today via point-of-care testing was 7.0 (prev 6.3 in January). We will test again in 3 months. Patient continues to heal well after recent knee replacement and maintains an excellent exercise regimen. Pt continues to struggle with diet, but will work on this over the coming months. Will return for follow-up in 3 months.  Plan: 1) Continue making healthy dietary choices and limiting portion sizes 2) Continue to maintain exercise regimen, great job with this 3) Follow-up in 3 months on Wed, Sept 30th @ 3:30 pm

## 2014-02-02 ENCOUNTER — Other Ambulatory Visit: Payer: Self-pay | Admitting: Internal Medicine

## 2014-02-07 ENCOUNTER — Ambulatory Visit (HOSPITAL_COMMUNITY)
Admission: RE | Admit: 2014-02-07 | Discharge: 2014-02-07 | Disposition: A | Discharge status: Home / Self Care | Payer: 59 | Source: Ambulatory Visit | Attending: Cardiology | Admitting: Cardiology

## 2014-02-07 ENCOUNTER — Encounter (HOSPITAL_COMMUNITY): Payer: Self-pay

## 2014-02-07 VITALS — BP 164/72 | HR 76 | Wt 319.1 lb

## 2014-02-07 DIAGNOSIS — Z888 Allergy status to other drugs, medicaments and biological substances status: Secondary | ICD-10-CM | POA: Insufficient documentation

## 2014-02-07 DIAGNOSIS — E669 Obesity, unspecified: Secondary | ICD-10-CM | POA: Insufficient documentation

## 2014-02-07 DIAGNOSIS — E119 Type 2 diabetes mellitus without complications: Secondary | ICD-10-CM | POA: Insufficient documentation

## 2014-02-07 DIAGNOSIS — G4733 Obstructive sleep apnea (adult) (pediatric): Secondary | ICD-10-CM | POA: Insufficient documentation

## 2014-02-07 DIAGNOSIS — Z87891 Personal history of nicotine dependence: Secondary | ICD-10-CM | POA: Insufficient documentation

## 2014-02-07 DIAGNOSIS — I10 Essential (primary) hypertension: Secondary | ICD-10-CM

## 2014-02-07 DIAGNOSIS — E785 Hyperlipidemia, unspecified: Secondary | ICD-10-CM

## 2014-02-07 DIAGNOSIS — Z881 Allergy status to other antibiotic agents status: Secondary | ICD-10-CM | POA: Insufficient documentation

## 2014-02-07 NOTE — Patient Instructions (Signed)
Cmet and Lipid panel next week at [facility] office.  Follow-up in 2 months--Our office will call you.

## 2014-02-07 NOTE — Progress Notes (Signed)
Patient ID: Rodney Hayes, male   DOB: 1950/07/17, 63 y.o.   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 Denies h/o known CAD or other heart problems.  We saw him 3 years ago due to ECG with mildly biphasic T waves inferolaterally. New from 2011 but not too different from 2010.   Had exercise bike test: Rode to 240W no ST-T changes Echo: EF 60-65% Grade 1 DD. Mild plaque in aortic arch.  Had L TKA in 6/14. Lost 30 pounds after surgery but put it back on.   Remains very active.  Exercises 4-5 x/week for 1+ hours per day.  Does Pilates, bike and elliptical. Max HR ~ 145. Works out on [employer] 20-45 mins. No CP. SOB with activity which is slightly worse walking up steps.    DM2 well controlled. BP at home 140/60s.   Lab Results  Component Value Date   CHOL 162 12/10/2012   HDL 43.90 12/10/2012   LDLCALC 93 12/10/2012   LDLDIRECT 69.9 07/28/2012   TRIG 125.0 12/10/2012   CHOLHDL 4 12/10/2012     Past Medical History  Diagnosis Date  . Hyperlipidemia   . Hypertension   . Diabetes mellitus   . Obesity   . Nonspecific abnormal electrocardiogram (ECG) (EKG)   . Asthma   . Arthritis   . DJD (degenerative joint disease)   . BPH (benign prostatic hyperplasia)   . Sleep apnea     C-PAP    Current Outpatient Prescriptions  Medication Sig Dispense Refill  . amLODipine (NORVASC) 10 MG tablet Take 1 tablet (10 mg total) by mouth every morning.  90 tablet  3  . Blood Glucose Monitoring Suppl (ONE TOUCH ULTRA MINI) W/DEVICE KIT Please use to monitor glucose levels. Dx. 250.00  1 each  0  . hydrochlorothiazide (HYDRODIURIL) 25 MG tablet Take 25 mg by mouth every morning.      Marland Kitchen KOMBIGLYZE XR 2.11-998 MG TB24 TAKE 1 TABLET BY MOUTH DAILY WITH LARGEST MEAL  90 tablet  PRN  . losartan (COZAAR) 100 MG tablet Take  100 mg by mouth every morning.      Marland Kitchen NOVOFINE 32G X 6 MM MISC USE DAILY AS DIRECTED  100 each  11  . ONE TOUCH ULTRA TEST test strip TEST BLOOD GLUCOSE ONCE DAILY  100 each  PRN  . ONETOUCH DELICA LANCETS 25K MISC TEST ONCE DAILY  100 each  3  . tamsulosin (FLOMAX) 0.4 MG CAPS Take 0.4 mg by mouth every morning.      Marland Kitchen VICTOZA 18 MG/3ML SOPN INJECT 1.8 MG UNDER THE SKIN DAILY.  9 mL  5   No current facility-administered medications for this encounter.     Allergies  Allergen Reactions  . Cymbalta [Duloxetine Hcl]     As per e-mail 12/13/11: Insomnia, lethargy, inability to ejaculate  . Tetracycline     REACTION: PHOTOSENSITIVITY    History   Social History  . Marital Status: Married    Spouse Name: N/A    Number of Children: N/A  . Years of Education: N/A   Occupational History  . Not on file.   Social History Main Topics  . Smoking status: Former Smoker    Quit date: 08/15/1992  . Smokeless tobacco: Not on file  Comment: smoked 14 -42 up to 2 ppd  . Alcohol Use: 6.0 oz/week    10 Glasses of wine per week     Comment: Red Wine  . Drug Use: No  . Sexual Activity: Not on file   Other Topics Concern  . Not on file   Social History Narrative  . No narrative on file    Family History  Problem Relation Age of Onset  . Lung cancer Mother     smoker  . Diabetes Father   . Coronary artery disease Father     CBAG in late 53s  . Stroke Neg Hx   Father: died at 66. + DM2. (heavy tobacco) Mother: died at 34. Lung CA (heavy tobacco)  PHYSICAL EXAM: Filed Vitals:   02/07/14 1424  BP: 164/72  Pulse: 76   General:  Well appearing. No respiratory difficulty HEENT: normal Neck: supple. no JVD. Carotids 2+ bilat; no bruits. No lymphadenopathy or thryomegaly appreciated. Cor: PMI nondisplaced. Regular rate & rhythm. No rubs, gallops. 2/6 systolic murmur over aortic valve. S2 crisp.  Lungs: clear Abdomen: obese soft, nontender, nondistended. Good bowel  sounds. Extremities: no cyanosis, clubbing, rash, 2+ edema DPs 2+ Neuro: alert & oriented x 3, cranial nerves grossly intact. moves all 4 extremities w/o difficulty. Affect pleasant.  ECG: NSR 71 flat t-waves inferiorly. 1AVB (202 ms). + PVC    ASSESSMENT & PLAN:  Overall Rodney Hayes is doing OK . He remains very active without symptoms but I continue to work about his risk factors including his family history, weight gain and HTN. Even though his DM is well controlled I do think he should be on a statin but he is reluctant about this due to previous HBV exposure. We also talked about need to get SBP < 140.   He would like a 19-monthperiod to change his diet and lose weight prior to aggressively treating lipids or BP. I will go along with this. Will recheck lipids next week. If up can consider zetia if he doesn't want statin. If BP remains up consider switching flomax to cardura. Doesn't want spiro  We discussed role of cardiac CT or calcium scoring for further risk stratification. He will d/w his wife.   See back in 2 months,   DBenay Spice3:22 PM

## 2014-02-15 ENCOUNTER — Other Ambulatory Visit: Payer: 59

## 2014-02-15 DIAGNOSIS — I10 Essential (primary) hypertension: Secondary | ICD-10-CM

## 2014-02-15 LAB — COMPREHENSIVE METABOLIC PANEL
ALK PHOS: 58 U/L (ref 39–117)
ALT: 34 U/L (ref 0–53)
AST: 25 U/L (ref 0–37)
Albumin: 4.6 g/dL (ref 3.5–5.2)
BILIRUBIN TOTAL: 0.6 mg/dL (ref 0.2–1.2)
BUN: 14 mg/dL (ref 6–23)
CO2: 30 meq/L (ref 19–32)
Calcium: 10 mg/dL (ref 8.4–10.5)
Chloride: 99 mEq/L (ref 96–112)
Creat: 0.87 mg/dL (ref 0.50–1.35)
GLUCOSE: 111 mg/dL — AB (ref 70–99)
Potassium: 4 mEq/L (ref 3.5–5.3)
SODIUM: 139 meq/L (ref 135–145)
TOTAL PROTEIN: 7.5 g/dL (ref 6.0–8.3)

## 2014-02-15 LAB — CHOLESTEROL, TOTAL: CHOLESTEROL: 156 mg/dL (ref 0–200)

## 2014-02-17 ENCOUNTER — Other Ambulatory Visit: Payer: Self-pay | Admitting: Internal Medicine

## 2014-02-23 ENCOUNTER — Other Ambulatory Visit: Payer: Self-pay | Admitting: Internal Medicine

## 2014-03-10 ENCOUNTER — Encounter: Payer: Self-pay | Admitting: Family Medicine

## 2014-03-10 NOTE — Progress Notes (Signed)
Patient ID: Rodney Hayes, male   DOB: [birth year], 63 y.o.   MRN: [id number] Reviewed: Agree with the documentation and management by my [place].

## 2014-03-10 NOTE — Progress Notes (Signed)
Patient ID: Rodney Hayes, male   DOB: 01-31-1951, 62 y.o.   MRN: [id number] Reviewed: Agree with the documentation and management by my [place].

## 2014-03-19 ENCOUNTER — Encounter: Payer: Self-pay | Admitting: Internal Medicine

## 2014-03-22 ENCOUNTER — Ambulatory Visit (INDEPENDENT_AMBULATORY_CARE_PROVIDER_SITE_OTHER): Payer: 59 | Admitting: Sports Medicine

## 2014-03-22 ENCOUNTER — Encounter: Payer: Self-pay | Admitting: Sports Medicine

## 2014-03-22 VITALS — BP 162/74 | Ht 70.5 in | Wt 370.0 lb

## 2014-03-22 DIAGNOSIS — M19219 Secondary osteoarthritis, unspecified shoulder: Secondary | ICD-10-CM

## 2014-03-22 DIAGNOSIS — M25512 Pain in left shoulder: Secondary | ICD-10-CM

## 2014-03-22 DIAGNOSIS — M19012 Primary osteoarthritis, left shoulder: Secondary | ICD-10-CM | POA: Insufficient documentation

## 2014-03-22 DIAGNOSIS — R079 Chest pain, unspecified: Secondary | ICD-10-CM

## 2014-03-22 DIAGNOSIS — M25519 Pain in unspecified shoulder: Secondary | ICD-10-CM

## 2014-03-22 DIAGNOSIS — M19112 Post-traumatic osteoarthritis, left shoulder: Secondary | ICD-10-CM

## 2014-03-22 MED ORDER — METHYLPREDNISOLONE ACETATE 40 MG/ML IJ SUSP
40.0000 mg | Freq: Once | INTRAMUSCULAR | Status: AC
  Administered 2014-03-22: 40 mg via INTRA_ARTICULAR

## 2014-03-22 NOTE — Assessment & Plan Note (Signed)
He needs to limit activities that cause anything other than mild pain  Limit NSAID use with his Hx of DM  Use meds as required for pain relief./  Bad reaction to tramadol in past  Procedure:  Injection of left shoulder Consent obtained and verified. Time-out conducted. Noted no overlying erythema, induration, or other signs of local infection. Skin prepped in a sterile fashion. Topical analgesic spray: Ethyl chloride. Completed without difficulty. Meds: 1cc kenalog 40 and 4 cc lidocain 1% Injected in posterior window to subacromial space/ noted calcfic resistance in capsule Pain immediately improved suggesting accurate placement of the medication. Advised to call if fevers/chills, erythema, induration, drainage, or persistent bleeding.  We should follow his response If good we can consider repeating every 3 to 4 mos for pain relief prn

## 2014-03-22 NOTE — Progress Notes (Signed)
Subjective:    Patient ID: Rodney Hayes, male    DOB: 1951/05/29, 63 y.o.   MRN: [id number]  HPI 63 y.o. year old male who  has a past medical history of Hyperlipidemia; Hypertension; Diabetes mellitus; Obesity; Nonspecific abnormal electrocardiogram (ECG) (EKG); Asthma; Arthritis; DJD (degenerative joint disease); BPH (benign prostatic hyperplasia); and Sleep apnea. He presents with 1 year insidious course of worsening left  shoulder pain. He is a former Airline pilot, denies any recent trauma but has a distant history of left biceps tear with repair.  He mainly complains of decreased ROM with internal and external rotation; painless crepitus at these extremes and while he is lifting weights. He has tried OTC NSAIDs, topical NSAIDs, ice, heat, all with minimal relief. He is being seen by a chiropractor for several months who has been working with his ROM. He feels this is helping. Johnston Ebbs DC is doing some ART and that has improved movement in anterior plane in particular.  Send by Leatrice Jewels, PT/ concerned about pushing certain therapy with patient having ongoing pain  Patient does c/o nigh pain and positional pain  Patient states his blood sugars have been well-controlled.  Review of Systems Per HPI, no weakness, no numbness, no tingling, no changes in temperature sensation  Past Medical History  Diagnosis Date  . Hyperlipidemia   . Hypertension   . Diabetes mellitus   . Obesity   . Nonspecific abnormal electrocardiogram (ECG) (EKG)   . Asthma   . Arthritis   . DJD (degenerative joint disease)   . BPH (benign prostatic hyperplasia)   . Sleep apnea     C-PAP       Objective:   Physical Exam Obese but muscular W male  BP 162/74  Ht 5' 10.5" (1.791 m)  Wt 370 lb (167.831 kg)  BMI 52.32 kg/m2   Left shoulder: some mild asymmeetry compared to contralateral; warmth appreciated.  Flexion to 150 deg - about 20 less than RT  abduction to 130 degrees;  internal   rotation marked restriction on back scratch. At waist level has good motion ER is limited to 20 deg less than RT/ at 90 deg abduction minimal rotation Frozen shouder test left is +/ neg on RT ; +crepitus with int / ext rotation;  Neers, Hawkins, Empty Can negative Speeds and Yergason's are negative Note he lacks full supination in both forearms - worse on left Scar left elbow  Diagnostic Ultrasound Evalution: Art therapist E, MSK ultrasound, MSK probe Anatomy scanned: left shoulder Indication: Pain Findings: calcification of supraspinatus tendon; irregular surface of humeral head;  SST is intact Subcapularis atrophy Biceps tendon intact Infraspinatus and Teres Rhudy are intact/ infraspinatus with some atrophy No tear noted Jont shows arthritic change and calcified labrum    Assessment & Plan:    Diagnoses and associated orders for this visit:  Left shoulder pain - Initial and persistent concern for frozen shoulder with patient's history of DM2 however as he was treated by chiropractor, potentially mitigated this. He has marked OA as seen on plain films from outside facility. This was seen on in-house ultrasound as well.  - Intrarticular steroid injection to left shoulder; closely monitor blood sugars - can continue strength exercises, advised to limit exercises that go above 90 degree line of shoulder so as to keep shoulder space open (minmize overhead) - continue chiropractor  Other Orders - methylPREDNISolone acetate (DEPO-MEDROL) injection 40 mg; Inject 1 mL (40 mg total) into the articular space  once.  Rosette Reveal, MD [facility] PGY-3  Edited and modifeid by Stefanie Libel, MD

## 2014-03-22 NOTE — Assessment & Plan Note (Signed)
I suspect he has had recurrent trauma from power lifting for years  Try to maintain ROM but don't force past painful point  Do any strength work in protected ROM  Reck 3 mos prn

## 2014-03-30 ENCOUNTER — Telehealth (HOSPITAL_COMMUNITY): Payer: Self-pay | Admitting: *Deleted

## 2014-03-30 DIAGNOSIS — I10 Essential (primary) hypertension: Secondary | ICD-10-CM

## 2014-03-30 NOTE — Telephone Encounter (Signed)
Per Dr Haroldine Laws order placed for CTA of coronary morph w/CTA, Dr Meda Coffee to do 9/17

## 2014-04-01 ENCOUNTER — Ambulatory Visit (HOSPITAL_COMMUNITY)
Admission: RE | Admit: 2014-04-01 | Discharge: 2014-04-01 | Disposition: A | Discharge status: Home / Self Care | Payer: 59 | Source: Ambulatory Visit | Attending: Internal Medicine | Admitting: Internal Medicine

## 2014-04-01 ENCOUNTER — Encounter (HOSPITAL_COMMUNITY): Payer: Self-pay

## 2014-04-01 ENCOUNTER — Telehealth: Payer: Self-pay | Admitting: *Deleted

## 2014-04-01 ENCOUNTER — Ambulatory Visit: Payer: 59

## 2014-04-01 VITALS — BP 159/69 | HR 78 | Resp 16

## 2014-04-01 DIAGNOSIS — E119 Type 2 diabetes mellitus without complications: Secondary | ICD-10-CM | POA: Diagnosis present

## 2014-04-01 DIAGNOSIS — I10 Essential (primary) hypertension: Secondary | ICD-10-CM | POA: Diagnosis present

## 2014-04-01 DIAGNOSIS — R918 Other nonspecific abnormal finding of lung field: Secondary | ICD-10-CM | POA: Diagnosis not present

## 2014-04-01 DIAGNOSIS — Z6841 Body Mass Index (BMI) 40.0 and over, adult: Secondary | ICD-10-CM | POA: Insufficient documentation

## 2014-04-01 DIAGNOSIS — Z79899 Other long term (current) drug therapy: Secondary | ICD-10-CM | POA: Diagnosis not present

## 2014-04-01 DIAGNOSIS — R079 Chest pain, unspecified: Secondary | ICD-10-CM

## 2014-04-01 DIAGNOSIS — R0609 Other forms of dyspnea: Secondary | ICD-10-CM | POA: Insufficient documentation

## 2014-04-01 DIAGNOSIS — R0989 Other specified symptoms and signs involving the circulatory and respiratory systems: Secondary | ICD-10-CM | POA: Diagnosis not present

## 2014-04-01 DIAGNOSIS — E785 Hyperlipidemia, unspecified: Secondary | ICD-10-CM | POA: Insufficient documentation

## 2014-04-01 DIAGNOSIS — I2510 Atherosclerotic heart disease of native coronary artery without angina pectoris: Secondary | ICD-10-CM | POA: Insufficient documentation

## 2014-04-01 LAB — COMPREHENSIVE METABOLIC PANEL
ALT: 33 U/L (ref 0–53)
AST: 25 U/L (ref 0–37)
Albumin: 4.4 g/dL (ref 3.5–5.2)
Alkaline Phosphatase: 67 U/L (ref 39–117)
BUN: 16 mg/dL (ref 6–23)
CO2: 28 mEq/L (ref 19–32)
Calcium: 9.4 mg/dL (ref 8.4–10.5)
Chloride: 99 mEq/L (ref 96–112)
Creatinine, Ser: 0.8 mg/dL (ref 0.4–1.5)
GFR: 97.3 mL/min (ref >60.00)
Glucose, Bld: 148 mg/dL — ABNORMAL HIGH (ref 70–99)
Potassium: 3.8 mEq/L (ref 3.5–5.1)
Sodium: 138 mEq/L (ref 135–145)
Total Bilirubin: 0.3 mg/dL (ref 0.2–1.2)
Total Protein: 8 g/dL (ref 6.0–8.3)

## 2014-04-01 MED ORDER — NITROGLYCERIN 0.4 MG SL SUBL: 0.4000 mg | SUBLINGUAL_TABLET | SUBLINGUAL | Status: DC | PRN

## 2014-04-01 MED ORDER — IOHEXOL 350 MG/ML SOLN
80.0000 mL | Freq: Once | INTRAVENOUS | Status: AC | PRN
  Administered 2014-04-01: 80 mL via INTRAVENOUS

## 2014-04-01 MED ORDER — METOPROLOL TARTRATE 1 MG/ML IV SOLN
5.0000 mg | INTRAVENOUS | Status: DC | PRN
Start: 2014-04-01 — End: 2014-04-02
  Administered 2014-04-01 (×2): 5 mg via INTRAVENOUS

## 2014-04-01 MED ORDER — METOPROLOL TARTRATE 1 MG/ML IV SOLN
INTRAVENOUS | Status: AC
  Administered 2014-04-01: 5 mg via INTRAVENOUS
  Filled 2014-04-01: qty 10

## 2014-04-01 MED ORDER — NITROGLYCERIN 0.4 MG SL SUBL
0.8000 mg | SUBLINGUAL_TABLET | SUBLINGUAL | Status: DC | PRN
  Administered 2014-04-01: 0.4 mg via SUBLINGUAL

## 2014-04-01 MED ORDER — NITROGLYCERIN 0.4 MG SL SUBL
SUBLINGUAL_TABLET | SUBLINGUAL | Status: AC
  Filled 2014-04-01: qty 2

## 2014-04-01 NOTE — Addendum Note (Signed)
Addended by: Dorothy Spark on: 04/01/2014 09:07 AM   Modules accepted: Orders

## 2014-04-01 NOTE — Progress Notes (Signed)
Discharged home amb given metformin instructions post CT contrast.

## 2014-04-01 NOTE — Telephone Encounter (Signed)
Per Dr Haroldine Laws order placed for CTA of coronary morph w/CTA, Dr Meda Coffee to do 9/17 Per Dr Meda Coffee this pt needs a STAT CMP done at [facility] [address] lab and results sent to her prior to CTA.  Pt is aware of lab location.  Comments mentioned in order for STAT results to be called to Dr Meda Coffee when resulted.

## 2014-04-11 ENCOUNTER — Encounter (HOSPITAL_COMMUNITY): Payer: Self-pay

## 2014-04-11 ENCOUNTER — Ambulatory Visit (HOSPITAL_COMMUNITY)
Admission: RE | Admit: 2014-04-11 | Discharge: 2014-04-11 | Disposition: A | Discharge status: Home / Self Care | Payer: 59 | Source: Ambulatory Visit | Attending: Cardiology | Admitting: Cardiology

## 2014-04-11 VITALS — BP 166/74 | HR 90 | Wt 314.0 lb

## 2014-04-11 DIAGNOSIS — Z87891 Personal history of nicotine dependence: Secondary | ICD-10-CM | POA: Diagnosis not present

## 2014-04-11 DIAGNOSIS — Z96659 Presence of unspecified artificial knee joint: Secondary | ICD-10-CM | POA: Diagnosis not present

## 2014-04-11 DIAGNOSIS — E669 Obesity, unspecified: Secondary | ICD-10-CM | POA: Diagnosis not present

## 2014-04-11 DIAGNOSIS — E119 Type 2 diabetes mellitus without complications: Secondary | ICD-10-CM | POA: Insufficient documentation

## 2014-04-11 DIAGNOSIS — I10 Essential (primary) hypertension: Secondary | ICD-10-CM | POA: Diagnosis not present

## 2014-04-11 DIAGNOSIS — G4733 Obstructive sleep apnea (adult) (pediatric): Secondary | ICD-10-CM | POA: Diagnosis not present

## 2014-04-11 DIAGNOSIS — R911 Solitary pulmonary nodule: Secondary | ICD-10-CM | POA: Diagnosis not present

## 2014-04-11 DIAGNOSIS — R079 Chest pain, unspecified: Secondary | ICD-10-CM

## 2014-04-11 DIAGNOSIS — I2510 Atherosclerotic heart disease of native coronary artery without angina pectoris: Secondary | ICD-10-CM | POA: Diagnosis not present

## 2014-04-11 DIAGNOSIS — E785 Hyperlipidemia, unspecified: Secondary | ICD-10-CM | POA: Diagnosis not present

## 2014-04-11 MED ORDER — ASPIRIN EC 81 MG PO TBEC: 81.0000 mg | DELAYED_RELEASE_TABLET | Freq: Every day | ORAL | Status: DC

## 2014-04-11 MED ORDER — ATORVASTATIN CALCIUM 20 MG PO TABS
20.0000 mg | ORAL_TABLET | Freq: Every day | ORAL | Status: DC
Start: 2014-04-11 — End: 2015-02-28

## 2014-04-11 NOTE — Patient Instructions (Signed)
Start Aspirin 81 mg daily  Start Atorvastatin 20 mg daily  Your physician has requested that you have an exercise tolerance test. For further information please visit [email or web address]. Please also follow instruction sheet, as given.  We will contact you in 6 months to schedule your next appointment.

## 2014-04-12 NOTE — Progress Notes (Signed)
Patient ID: Rodney Hayes, male   DOB: 1951-03-29, 63 y.o.   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 Denies h/o known CAD or other heart problems.  We saw him 3 years ago due to ECG with mildly biphasic T waves inferolaterally. New from 2011 but not too different from 2010.   Had exercise bike test: Rode to 240W no ST-T changes Echo: EF 60-65% Grade 1 DD. Mild plaque in aortic arch.  Had L TKA in 6/14. Lost 30 pounds after surgery but put it back on.   Remains very active.  Exercises 4-5 x/week for 1+ hours per day.  Does Pilates, bike and elliptical. Max HR ~ 135. Works out on [employer] 20-45 mins. No CP. SOB with activity which is slightly worse walking up steps.    DM2 well controlled. SBP at home 140s-150s.   After last appointment, he had a coronary CT angiogram (9/15).  This showed very high calcium score (1847 Agatston units) and severe calcification in the LAD and RCA.  There was probably moderate LAD stenosis.  The mid RCA was uninterpretable due to calcification.  There was a 6 mm RLL nodule noted also.  Cardiac catheterization was discussed after this study but decided against due to lack of symptoms.   Lab Results  Component Value Date   CHOL 156 02/15/2014   HDL 43.90 12/10/2012   LDLCALC 93 12/10/2012   LDLDIRECT 69.9 07/28/2012   TRIG 125.0 12/10/2012   CHOLHDL 4 12/10/2012     Past Medical History  Diagnosis Date  . Hyperlipidemia   . Hypertension   . Diabetes mellitus   . Obesity   . Nonspecific abnormal electrocardiogram (ECG) (EKG)   . Asthma   . Arthritis   . DJD (degenerative joint disease)   . BPH (benign prostatic hyperplasia)   . Sleep apnea     C-PAP    Current Outpatient Prescriptions  Medication Sig Dispense Refill  . amLODipine (NORVASC) 10 MG tablet Take 1 tablet  (10 mg total) by mouth every morning.  90 tablet  3  . Blood Glucose Monitoring Suppl (ONE TOUCH ULTRA MINI) W/DEVICE KIT Please use to monitor glucose levels. Dx. 250.00  1 each  0  . hydrochlorothiazide (HYDRODIURIL) 25 MG tablet TAKE 1 TABLET BY MOUTH ONCE DAILY  90 tablet  1  . KOMBIGLYZE XR 2.11-998 MG TB24 TAKE 1 TABLET BY MOUTH DAILY WITH LARGEST MEAL  90 tablet  PRN  . losartan (COZAAR) 100 MG tablet Take 100 mg by mouth every morning.      Marland Kitchen NOVOFINE 32G X 6 MM MISC USE DAILY AS DIRECTED  100 each  11  . ONE TOUCH ULTRA TEST test strip TEST BLOOD GLUCOSE ONCE DAILY  100 each  PRN  . ONETOUCH DELICA LANCETS 40J MISC TEST ONCE DAILY  100 each  3  . tamsulosin (FLOMAX) 0.4 MG CAPS Take 0.4 mg by mouth every morning.      Marland Kitchen VICTOZA 18 MG/3ML SOPN INJECT 1.8 MG UNDER THE SKIN DAILY.  9 mL  5  . aspirin EC 81 MG tablet Take 1 tablet (81 mg total) by mouth daily.  90 tablet  3  . atorvastatin (LIPITOR) 20 MG tablet Take 1 tablet (20 mg total) by mouth daily.  90 tablet  3   No current facility-administered medications for this encounter.     Allergies  Allergen Reactions  . Cymbalta [Duloxetine Hcl]     As per e-mail 12/13/11: Insomnia, lethargy, inability to ejaculate  . Tetracycline     REACTION: PHOTOSENSITIVITY    History   Social History  . Marital Status: Married    Spouse Name: N/A    Number of Children: N/A  . Years of Education: N/A   Occupational History  . Not on file.   Social History Main Topics  . Smoking status: Former Smoker    Quit date: 08/15/1992  . Smokeless tobacco: Not on file     Comment: smoked 14 -42 up to 2 ppd  . Alcohol Use: 6.0 oz/week    10 Glasses of wine per week     Comment: Red Wine  . Drug Use: No  . Sexual Activity: Not on file   Other Topics Concern  . Not on file   Social History Narrative  . No narrative on file    Family History  Problem Relation Age of Onset  . Lung cancer Mother     smoker  . Diabetes Father   .  Coronary artery disease Father     CBAG in late 33s  . Stroke Neg Hx   Father: died at 51. + DM2. (heavy tobacco) Mother: died at 25. Lung CA (heavy tobacco)  PHYSICAL EXAM: Filed Vitals:   04/11/14 1429  BP: 166/74  Pulse: 90   General:  Well appearing. No respiratory difficulty HEENT: normal Neck: supple. no JVD. Carotids 2+ bilat; no bruits. No lymphadenopathy or thryomegaly appreciated. Cor: PMI nondisplaced. Regular rate & rhythm. No rubs, gallops. 1/6 SEM RUSB. S2 crisp.  Lungs: clear Abdomen: obese soft, nontender, nondistended. Good bowel sounds. Extremities: no cyanosis, clubbing, rash, 2+ edema DPs 2+ Neuro: alert & oriented x 3, cranial nerves grossly intact. moves all 4 extremities w/o difficulty. Affect pleasant.  ASSESSMENT & PLAN: 1. CAD: Patient has extensive plaque on coronary CTA with moderate LAD stenosis and ?significant mid RCA stenosis.  High calcium score.  He does not, however, have any exertional symptoms.  - Cardiac catheterization was discussed but decided against due to lack of symptoms.  Instead, he will do an ETT to assess for evidence of ischemia.  - He needs to start ASA 81 mg daily and a statin.  I will put him on atorvastatin 20 mg daily with lipids/LFTs in 2 months.  2. Pulmonary nodule: Found on 9/15 CT, will need noncontrast CT in 6 months.  He is a former smoker.  Followup will be arranged by his pulmonologist.  3. Hyperlipidemia: As above, needs to start statin.  4. HTN: BP running high at home, SBP 140s-150s for the most part. He has been compliant with meds.  I suggested stopping Flomax and starting terazosin.  Before doing this, he wants to make a concerted effort to lose weight to see if BP can be controlled without more medicine.    Followup 6 months unless symptoms develop.   Rodney Hayes 04/12/2014

## 2014-04-13 ENCOUNTER — Ambulatory Visit (INDEPENDENT_AMBULATORY_CARE_PROVIDER_SITE_OTHER): Payer: Self-pay | Admitting: Family Medicine

## 2014-04-13 VITALS — BP 137/76 | Wt 305.0 lb

## 2014-04-13 DIAGNOSIS — E119 Type 2 diabetes mellitus without complications: Secondary | ICD-10-CM

## 2014-04-13 NOTE — Progress Notes (Signed)
Patient presents today for 3 month diabetes follow-up as part of the employer-sponsored [employer] to [employer] program. Current diabetes regimen includes Kombiglyze (saxagliptin and Metformin) and Victoza. Patient also continues on daily ASA and ARB. Most recent MD follow-up was Sept 2015 for cardiology work-up. Patient is seen yearly by Dr. Linna Darner for DM assessment. On cardiology review patient was found to have calcification of RCA and LAD per coronary CT (see cardiology note from 9/28). Patient decided against cardiac cath and has discussed extensive lifestyle changes, specifically diet and weight loss as a first line approach. Patient's HTN has also become uncontrolled. MD discussed switch from tamsulosin to terazosin but patient would like to try diet and weight loss first.   Diabetes Assessment: Type of Diabetes: Type 2; MD managing Diabetes Hopper; checks blood glucose once daily; uses glucometer; takes medications as prescribed; checks feet daily; does not take an aspirin a day; Lowest CBG 120; hypoglycemia frequency None; Sees Diabetes provider 1 time a year; Highest CBG 196; A1c in July 7.0 Other Diabetes History: Current med regimen includes kombiglyze XR 2.11/998 mg daily and victoza 1.8 mg daily. Patient does maintain good medication compliance. He is determined to come off all diabetic medications and believes he can do so with dietary changes and weight loss. Patient did not bring meter today but continues testing 1 time per day, fasting. Glucose had increased and fasting was running 150-170 consistently; however, since starting Eat to Live diet, glucose has already decreased to 140s. No hypoglycemia. Glucose monitoring occurs fasting and when symptomatic. Patient does demonstrate appropriate correction of hypoglycemia. Patient denies signs and symptoms of neuropathy including numbness/tingling/burning and symptoms of foot infection. Patient is now up to date on yearly eye exam and will follow-up again  this fall. Right cataract remains unchanged, and no signs of diabetic retinopathy.   Lifestyle Factors: Diet - Patient continues to struggle with diet. He attempts to maintain a healthy diet and control portion sizes, but admits that this is a struggle and he just enjoys eating. Since cardiology assessment patient has had a "wake up call" and is serious about changing diet and losing weight. He started "Eat to Live" diet by Dr. Valera Castle this past Monday and has already lost 4 lbs. He is confident that he will be able to maintain this diet over the next three months.  Exercise - Pt continues exercising regularly and has even increased in intensity and duration as knee continues to heal. He is now exercising daily, sometimes twice daily four days per week. He does pilates twice weekly and cardio 4-5 days per week. Exercises include strength training, cardio, core strengthening, and pilates. He is now also able to use stairs at work since knee is healing and no longer as painful. Pt wears a fit bit, which helps him track physical activity.   Assessment: Patient returns today under good diabetic control. We do not have an updated A1c and will do one at next follow-up. Patient continues to heal well after recent knee replacement and maintains an excellent exercise regimen. Pt continues to struggle with diet, but will work on this over the coming months by adhering to Eat to Live diet by Dr. Valera Castle. His motivation is recent discovery of calcified coronary arteries. Will return for follow-up in 3 months.  Plan: 1) Continue to adhere to Eat to Live diet by Dr. Valera Castle, limiting carbs and increasing vegetables and fresh fruits 2) Continue to maintain exercise regimen, great job with this 3) Follow-up in 3 months  on Wed, Sept 30th @ 3:30 pm

## 2014-05-02 ENCOUNTER — Other Ambulatory Visit: Payer: Self-pay | Admitting: Internal Medicine

## 2014-05-02 DIAGNOSIS — E1159 Type 2 diabetes mellitus with other circulatory complications: Secondary | ICD-10-CM

## 2014-05-09 ENCOUNTER — Ambulatory Visit (HOSPITAL_COMMUNITY)
Admission: RE | Admit: 2014-05-09 | Discharge: 2014-05-09 | Disposition: A | Discharge status: Home / Self Care | Payer: 59 | Source: Ambulatory Visit | Attending: Internal Medicine | Admitting: Internal Medicine

## 2014-05-09 ENCOUNTER — Other Ambulatory Visit: Payer: Self-pay

## 2014-05-09 ENCOUNTER — Other Ambulatory Visit: Payer: Self-pay | Admitting: Nurse Practitioner

## 2014-05-09 DIAGNOSIS — R079 Chest pain, unspecified: Secondary | ICD-10-CM | POA: Insufficient documentation

## 2014-05-09 MED ORDER — DOXAZOSIN MESYLATE 2 MG PO TABS: 2.0000 mg | ORAL_TABLET | Freq: Every day | ORAL | Status: DC

## 2014-05-18 ENCOUNTER — Other Ambulatory Visit (HOSPITAL_COMMUNITY): Payer: Self-pay | Admitting: Orthopedic Surgery

## 2014-05-18 DIAGNOSIS — M25512 Pain in left shoulder: Secondary | ICD-10-CM

## 2014-05-19 ENCOUNTER — Encounter: Payer: Self-pay | Admitting: Family Medicine

## 2014-05-19 NOTE — Progress Notes (Signed)
Patient ID: Rodney Hayes, male   DOB: 12/19/1950, 63 y.o.   MRN: [id number] Reviewed: Agree with the documentation and management of our [place].

## 2014-05-26 LAB — HM DIABETES EYE EXAM

## 2014-05-31 ENCOUNTER — Ambulatory Visit (HOSPITAL_COMMUNITY)
Admission: RE | Admit: 2014-05-31 | Discharge: 2014-05-31 | Disposition: A | Discharge status: Home / Self Care | Payer: 59 | Source: Ambulatory Visit | Attending: Orthopedic Surgery | Admitting: Orthopedic Surgery

## 2014-05-31 DIAGNOSIS — M25512 Pain in left shoulder: Secondary | ICD-10-CM

## 2014-06-07 ENCOUNTER — Encounter: Payer: Self-pay | Admitting: Internal Medicine

## 2014-06-08 ENCOUNTER — Other Ambulatory Visit: Payer: Self-pay

## 2014-06-08 MED ORDER — ONETOUCH ULTRA BLUE VI STRP: ORAL_STRIP | Status: DC

## 2014-06-28 ENCOUNTER — Telehealth: Payer: Self-pay | Admitting: Nurse Practitioner

## 2014-06-28 ENCOUNTER — Other Ambulatory Visit: Payer: Self-pay | Admitting: Nurse Practitioner

## 2014-06-28 MED ORDER — DOXAZOSIN MESYLATE 2 MG PO TABS: 2.0000 mg | ORAL_TABLET | Freq: Every day | ORAL | Status: DC

## 2014-06-28 NOTE — Telephone Encounter (Signed)
Rodney Hayes spoke with me today in the hospital.  He requested that I change his Rx for cardura 2 mg to a 90 day prescription for the sake of cost savings.  I've sent in a Rx to the [facility] outpt pharmacy for Cardura 2mg , 1 PO daily, #90, 3 refills.  Murray Hodgkins, NP 06/28/2014 7:59 AM

## 2014-07-13 ENCOUNTER — Encounter: Payer: Self-pay | Admitting: Internal Medicine

## 2014-08-02 ENCOUNTER — Encounter (HOSPITAL_COMMUNITY)
Admission: RE | Admit: 2014-08-02 | Discharge: 2014-08-02 | Disposition: A | Discharge status: Home / Self Care | Payer: 59 | Source: Ambulatory Visit | Attending: Orthopedic Surgery | Admitting: Orthopedic Surgery

## 2014-08-02 ENCOUNTER — Encounter (HOSPITAL_COMMUNITY): Payer: Self-pay

## 2014-08-02 DIAGNOSIS — Z01818 Encounter for other preprocedural examination: Secondary | ICD-10-CM | POA: Diagnosis not present

## 2014-08-02 DIAGNOSIS — M19112 Post-traumatic osteoarthritis, left shoulder: Secondary | ICD-10-CM | POA: Diagnosis not present

## 2014-08-02 HISTORY — DX: Reserved for inherently not codable concepts without codable children: IMO0001

## 2014-08-02 HISTORY — DX: Cardiac murmur, unspecified: R01.1

## 2014-08-02 LAB — CBC
HCT: 39.3 % (ref 39.0–52.0)
Hemoglobin: 13.5 g/dL (ref 13.0–17.0)
MCH: 30.9 pg (ref 26.0–34.0)
MCHC: 34.4 g/dL (ref 30.0–36.0)
MCV: 89.9 fL (ref 78.0–100.0)
Platelets: 208 10*3/uL (ref 150–400)
RBC: 4.37 MIL/uL (ref 4.22–5.81)
RDW: 12.6 % (ref 11.5–15.5)
WBC: 7.6 10*3/uL (ref 4.0–10.5)

## 2014-08-02 LAB — BASIC METABOLIC PANEL
Anion gap: 4 — ABNORMAL LOW (ref 5–15)
BUN: 12 mg/dL (ref 6–23)
CO2: 33 mmol/L — AB (ref 19–32)
Calcium: 9.8 mg/dL (ref 8.4–10.5)
Chloride: 102 mEq/L (ref 96–112)
Creatinine, Ser: 0.84 mg/dL (ref 0.50–1.35)
GFR calc non Af Amer: 90 mL/min — ABNORMAL LOW (ref >90)
GLUCOSE: 149 mg/dL — AB (ref 70–99)
POTASSIUM: 3.6 mmol/L (ref 3.5–5.1)
SODIUM: 139 mmol/L (ref 135–145)

## 2014-08-02 LAB — SURGICAL PCR SCREEN
MRSA, PCR: NEGATIVE
Staphylococcus aureus: NEGATIVE

## 2014-08-02 NOTE — Pre-Procedure Instructions (Signed)
Rodney Hayes  08/02/2014   Your procedure is scheduled on:  Friday, January 29  Report to [facility] Admitting at 0530 AM.  Call this number if you have problems the morning of surgery: [phone].              Call the Preadmission department [phone] for all questions prior to your surgery date.   Remember:   Do not eat food or drink liquids after midnight.Thursday night.    Take these medicines the morning of surgery with A SIP OF WATER: amlodipine (Norvasc)   Do not wear jewelry.  Do not wear lotions, powders, or perfumes. You may not wear deodorant.  Do not shave 48 hours prior to surgery. Men may shave face and neck.  Do not bring valuables to the hospital.  [facility] is not responsible   for any belongings or valuables.               Contacts, dentures or bridgework may not be worn into surgery.  Leave suitcase in the car. After surgery it may be brought to your room.  For patients admitted to the hospital, discharge time is determined by your  treatment team.      Special Instructions: [place] - Preparing for Surgery  Before surgery, you can play an important role.  Because skin is not sterile, your skin needs to be as free of germs as possible.  You can reduce the number of germs on you skin by washing with CHG (chlorahexidine gluconate) soap before surgery.  CHG is an antiseptic cleaner which kills germs and bonds with the skin to continue killing germs even after washing.  Please DO NOT use if you have an allergy to CHG or antibacterial soaps.  If your skin becomes reddened/irritated stop using the CHG and inform your nurse when you arrive at Short Stay.  Do not shave (including legs and underarms) for at least 48 hours prior to the first CHG shower.  You may shave your face.  Please follow these instructions carefully:   1.  Shower with CHG Soap the night before surgery and the  morning of Surgery.  2.  If you choose to wash your hair, wash  your hair first as usual with your  normal shampoo.  3.  After you shampoo, rinse your hair and body thoroughly to remove the  Shampoo.  4.  Use CHG as you would any other liquid soap.  You can apply chg directly   to the skin and wash gently with scrungie or a clean washcloth.  5.  Apply the CHG Soap to your body ONLY FROM THE NECK DOWN.   Do not use on open wounds or open sores.  Avoid contact with your eyes,   ears, mouth and genitals (private parts).  Wash genitals (private parts)   with your normal soap.  6.  Wash thoroughly, paying special attention to the area where your surgery  will be performed.  7.  Thoroughly rinse your body with warm water from the neck down.  8.  DO NOT shower/wash with your normal soap after using and rinsing off  the CHG Soap.  9.  Pat yourself dry with a clean towel.            10.  Wear clean pajamas.            11.  Place clean sheets on your bed the night of your first shower and do not  sleep with pets.  Day of Surgery  Do not apply any lotions/deoderants the morning of surgery.  Please wear clean clothes to the hospital/surgery center.     Please read over the following fact sheets that you were given: Pain Booklet, Coughing and Deep Breathing and Surgical Site Infection Prevention

## 2014-08-02 NOTE — Progress Notes (Signed)
Primary =- dr. Linna Darner Cardiologist - [name] Stress in sept 2015, echo 2014, ekg July 2015

## 2014-08-02 NOTE — H&P (Signed)
Rodney Hayes is an 64 y.o. male.    Chief Complaint: left shoulder pain  HPI: Pt is a 64 y.o. male complaining of left shoulder pain for multiple years. Pain had continually increased since the beginning. X-rays in the clinic show end-stage arthritic changes of the left shoulder. Pt has tried various conservative treatments which have failed to alleviate their symptoms, including injections and therapy. Various options are discussed with the patient. Risks, benefits and expectations were discussed with the patient. Patient understand the risks, benefits and expectations and wishes to proceed with surgery.   PCP:  Unice Cobble, MD  D/C Plans:  Home with HHPT  PMH: Past Medical History  Diagnosis Date  . Hyperlipidemia   . Hypertension   . Diabetes mellitus   . Obesity   . Nonspecific abnormal electrocardiogram (ECG) (EKG)   . Asthma   . Arthritis   . DJD (degenerative joint disease)   . BPH (benign prostatic hyperplasia)   . Sleep apnea     C-PAP    PSH: Past Surgical History  Procedure Laterality Date  . Tonsillectomy and adenoidectomy    . Umbilical hernia repair    . Left knee arthroscopy    . Biceps tendon reinsertion    . Colonoscopy with polypectomy      adenomatous; [name]  . Total knee arthroplasty Left 01/11/2013    Procedure: LEFT TOTAL KNEE ARTHROPLASTY;  Surgeon: [name], MD;  Location: [facility] ORS;  Service: Orthopedics;  Laterality: Left;    Social History:  reports that he quit smoking about 21 years ago. He does not have any smokeless tobacco history on file. He reports that he drinks about 6.0 oz of alcohol per week. He reports that he does not use illicit drugs.  Allergies:  Allergies  Allergen Reactions  . Cymbalta [Duloxetine Hcl]     As per e-mail 12/13/11: Insomnia, lethargy, inability to ejaculate  . Tetracycline     REACTION: PHOTOSENSITIVITY    Medications: No current facility-administered medications for this encounter.    Current Outpatient Prescriptions  Medication Sig Dispense Refill  . amLODipine (NORVASC) 10 MG tablet Take 1 tablet (10 mg total) by mouth every morning. 90 tablet 3  . aspirin EC 81 MG tablet Take 1 tablet (81 mg total) by mouth daily. 90 tablet 3  . atorvastatin (LIPITOR) 20 MG tablet Take 1 tablet (20 mg total) by mouth daily. 90 tablet 3  . Blood Glucose Monitoring Suppl (ONE TOUCH ULTRA MINI) W/DEVICE KIT Please use to monitor glucose levels. Dx. 250.00 1 each 0  . doxazosin (CARDURA) 2 MG tablet Take 1 tablet (2 mg total) by mouth daily. 90 tablet 3  . hydrochlorothiazide (HYDRODIURIL) 25 MG tablet TAKE 1 TABLET BY MOUTH ONCE DAILY 90 tablet 1  . losartan (COZAAR) 100 MG tablet Take 100 mg by mouth every morning.    Marland Kitchen NOVOFINE 32G X 6 MM MISC USE DAILY AS DIRECTED 100 each 11  . ONE TOUCH ULTRA TEST test strip TEST BLOOD GLUCOSE ONCE DAILY ICD E11.51 100 each 5  . ONETOUCH DELICA LANCETS 01K MISC TEST ONCE DAILY 100 each 3    No results found for this or any previous visit (from the past 48 hour(s)). No results found.  ROS: Pain with rom of the left upper extremity  Physical Exam:  Alert and oriented 64 y.o. male in no acute distress Cranial nerves 2-12 intact Cervical spine: full rom with no tenderness, nv intact distally Chest: active breath  sounds bilaterally, no wheeze rhonchi or rales Heart: regular rate and rhythm, no murmur Abd: non tender non distended with active bowel sounds Hip is stable with rom  Left shoulder with decreased rom and crepitus  nv intact distally Strength of ER and IR 4.5/5  No rashes or edema  Assessment/Plan Assessment: left shoulder end stage osteoarthritis  Plan: Patient will undergo a left total shoulder arthroplasty by Dr. Veverly Fells at [facility]. Risks benefits and expectations were discussed with the patient. Patient understand risks, benefits and expectations and wishes to proceed.

## 2014-08-10 ENCOUNTER — Ambulatory Visit (INDEPENDENT_AMBULATORY_CARE_PROVIDER_SITE_OTHER): Payer: Self-pay | Admitting: Family Medicine

## 2014-08-10 VITALS — BP 146/74 | Wt 291.0 lb

## 2014-08-10 DIAGNOSIS — E118 Type 2 diabetes mellitus with unspecified complications: Secondary | ICD-10-CM

## 2014-08-10 NOTE — Progress Notes (Signed)
Patient presents today for 3 month diabetes follow-up as part of the employer-sponsored [employer] to Wellness program. Patient is not currently on a diabetic regimen, this is by choice due to extreme dietary changes and glucose readings. In the past patient has been on Kombiglyze (saxagliptin and Metformin) and Victoza. Patient also continues on daily ASA and ARB. Most recent MD follow-up was Sept 2015 for cardiology work-up. He is due for yearly physical with Dr. Linna Darner. Patient recently switched from tamsulosin to doxazosin by Sharolyn Douglas, NP. Also, patient is scheduled for shoulder surgery this Friday and has been off naproxen for 1 week in preparation for this.  Diabetes Assessment: Type of Diabetes: Type 2; MD managing Diabetes Hopper; checks blood glucose once daily; uses glucometer; takes medications as prescribed; checks feet daily; does not take an aspirin a day; Lowest CBG 120; hypoglycemia frequency None; Sees Diabetes provider 1 time a year; Highest CBG 196; Other Diabetes History:  138 this AM. fasting glucose is usually <140 when patient is maintaining a healthy diet.  Patient has currently discontinued his diabetic medications. He started the Eat to Live diet in September and began having hypoglycemic events so discontinued Victoza and eventually also discontinued kombiglyze. Patient does maintain good compliance with other maintenance medications. Patient did not bring meter today but continues testing 1 time per day, fasting. Glucose has decreased with fasting consistently <140 (prev 150-170). No hypoglycemia. Glucose monitoring occurs fasting and when symptomatic. Patient does demonstrate appropriate correction of hypoglycemia. Patient denies signs and symptoms of neuropathy including numbness/tingling/burning and symptoms of foot infection. Patient is now up to date on yearly eye exam and will follow-up again this fall. Right cataract remains unchanged, and no signs of diabetic retinopathy.  Lifestyle  Factors: Diet - Starting in September, patient is doing Eat to Live by Dr. Valera Castle. This diet is plant based vs animal based and has required patient to make serious changes to diet. He has not eaten a hamburger since September and has had pizza only twice, he is limiting carbs including breads, crackers, starches, etc. Although extreme, he believes this is a diet he can maintain.  Exercise - slightly less exercise due to shoulder pain, resulting is less lifting and cardio. However, patient is able to continue using recumbent bike and continues pilates 1-2 times per week.   Plan: 1) Continue to adhere to Eat to Live diet by Dr. Valera Castle, limiting carbs and increasing vegetables and fresh fruits 2) Continue to maintain exercise regimen, great job with this 3) Follow-up in 3 months on Wed April 27th @ 3:30 pm

## 2014-08-11 MED ORDER — DEXTROSE 5 % IV SOLN
3.0000 g | INTRAVENOUS | Status: AC
  Administered 2014-08-12: 3 g via INTRAVENOUS
  Filled 2014-08-11: qty 3000

## 2014-08-12 ENCOUNTER — Inpatient Hospital Stay (HOSPITAL_COMMUNITY)
Admission: RE | Admit: 2014-08-12 | Discharge: 2014-08-13 | DRG: 483 | Disposition: A | Discharge status: Home / Self Care | Payer: 59 | Source: Ambulatory Visit | Attending: Orthopedic Surgery | Admitting: Orthopedic Surgery

## 2014-08-12 ENCOUNTER — Encounter (HOSPITAL_COMMUNITY)
Admission: RE | Disposition: A | Discharge status: Home / Self Care | Payer: Self-pay | Source: Ambulatory Visit | Attending: Orthopedic Surgery

## 2014-08-12 ENCOUNTER — Inpatient Hospital Stay (HOSPITAL_COMMUNITY): Payer: 59 | Admitting: Anesthesiology

## 2014-08-12 ENCOUNTER — Inpatient Hospital Stay (HOSPITAL_COMMUNITY): Payer: 59

## 2014-08-12 ENCOUNTER — Encounter (HOSPITAL_COMMUNITY): Payer: Self-pay | Admitting: *Deleted

## 2014-08-12 DIAGNOSIS — E785 Hyperlipidemia, unspecified: Secondary | ICD-10-CM | POA: Diagnosis present

## 2014-08-12 DIAGNOSIS — Z96619 Presence of unspecified artificial shoulder joint: Secondary | ICD-10-CM

## 2014-08-12 DIAGNOSIS — Z96652 Presence of left artificial knee joint: Secondary | ICD-10-CM | POA: Diagnosis present

## 2014-08-12 DIAGNOSIS — N400 Enlarged prostate without lower urinary tract symptoms: Secondary | ICD-10-CM | POA: Diagnosis present

## 2014-08-12 DIAGNOSIS — Z79899 Other long term (current) drug therapy: Secondary | ICD-10-CM | POA: Diagnosis not present

## 2014-08-12 DIAGNOSIS — Z96612 Presence of left artificial shoulder joint: Secondary | ICD-10-CM

## 2014-08-12 DIAGNOSIS — E669 Obesity, unspecified: Secondary | ICD-10-CM | POA: Diagnosis present

## 2014-08-12 DIAGNOSIS — M19012 Primary osteoarthritis, left shoulder: Secondary | ICD-10-CM | POA: Diagnosis present

## 2014-08-12 DIAGNOSIS — M25512 Pain in left shoulder: Secondary | ICD-10-CM | POA: Diagnosis present

## 2014-08-12 DIAGNOSIS — J45909 Unspecified asthma, uncomplicated: Secondary | ICD-10-CM | POA: Diagnosis present

## 2014-08-12 DIAGNOSIS — G4730 Sleep apnea, unspecified: Secondary | ICD-10-CM | POA: Diagnosis present

## 2014-08-12 DIAGNOSIS — Z87891 Personal history of nicotine dependence: Secondary | ICD-10-CM | POA: Diagnosis not present

## 2014-08-12 DIAGNOSIS — I10 Essential (primary) hypertension: Secondary | ICD-10-CM | POA: Diagnosis present

## 2014-08-12 DIAGNOSIS — E119 Type 2 diabetes mellitus without complications: Secondary | ICD-10-CM | POA: Diagnosis present

## 2014-08-12 HISTORY — PX: TOTAL SHOULDER ARTHROPLASTY: SHX126

## 2014-08-12 LAB — GLUCOSE, CAPILLARY
Glucose-Capillary: 131 mg/dL — ABNORMAL HIGH (ref 70–99)
Glucose-Capillary: 144 mg/dL — ABNORMAL HIGH (ref 70–99)
Glucose-Capillary: 128 mg/dL — ABNORMAL HIGH (ref 70–99)
Glucose-Capillary: 126 mg/dL — ABNORMAL HIGH (ref 70–99)

## 2014-08-12 SURGERY — ARTHROPLASTY, SHOULDER, TOTAL
Anesthesia: Regional | Site: Shoulder | Laterality: Left

## 2014-08-12 MED ORDER — DOXAZOSIN MESYLATE 2 MG PO TABS
2.0000 mg | ORAL_TABLET | Freq: Every day | ORAL | Status: DC
  Administered 2014-08-13: 2 mg via ORAL
  Filled 2014-08-12: qty 1

## 2014-08-12 MED ORDER — METHOCARBAMOL 500 MG PO TABS: 500.0000 mg | ORAL_TABLET | Freq: Three times a day (TID) | ORAL | Status: DC | PRN

## 2014-08-12 MED ORDER — SUCCINYLCHOLINE CHLORIDE 20 MG/ML IJ SOLN
INTRAMUSCULAR | Status: AC
  Filled 2014-08-12: qty 1

## 2014-08-12 MED ORDER — EPHEDRINE SULFATE 50 MG/ML IJ SOLN
INTRAMUSCULAR | Status: AC
  Filled 2014-08-12: qty 1

## 2014-08-12 MED ORDER — SODIUM CHLORIDE 0.9 % IV SOLN
INTRAVENOUS | Status: DC | PRN
  Administered 2014-08-12: 08:00:00 via INTRAVENOUS

## 2014-08-12 MED ORDER — CEFAZOLIN SODIUM-DEXTROSE 2-3 GM-% IV SOLR
2.0000 g | Freq: Four times a day (QID) | INTRAVENOUS | Status: AC
  Administered 2014-08-12 – 2014-08-13 (×3): 2 g via INTRAVENOUS
  Filled 2014-08-12 (×3): qty 50

## 2014-08-12 MED ORDER — METHOCARBAMOL 1000 MG/10ML IJ SOLN
500.0000 mg | Freq: Four times a day (QID) | INTRAVENOUS | Status: DC | PRN
  Filled 2014-08-12 (×2): qty 5

## 2014-08-12 MED ORDER — OXYCODONE HCL 5 MG PO TABS
ORAL_TABLET | ORAL | Status: AC
  Administered 2014-08-12: 5 mg via ORAL
  Filled 2014-08-12: qty 1

## 2014-08-12 MED ORDER — LOSARTAN POTASSIUM 50 MG PO TABS
100.0000 mg | ORAL_TABLET | Freq: Every morning | ORAL | Status: DC
  Administered 2014-08-12 – 2014-08-13 (×2): 100 mg via ORAL
  Filled 2014-08-12 (×2): qty 2

## 2014-08-12 MED ORDER — THROMBIN 5000 UNITS EX SOLR
CUTANEOUS | Status: AC
  Filled 2014-08-12: qty 5000

## 2014-08-12 MED ORDER — MENTHOL 3 MG MT LOZG: 1.0000 | LOZENGE | OROMUCOSAL | Status: DC | PRN

## 2014-08-12 MED ORDER — ARTIFICIAL TEARS OP OINT
TOPICAL_OINTMENT | OPHTHALMIC | Status: AC
  Filled 2014-08-12: qty 3.5

## 2014-08-12 MED ORDER — INSULIN ASPART 100 UNIT/ML ~~LOC~~ SOLN
0.0000 [IU] | Freq: Three times a day (TID) | SUBCUTANEOUS | Status: DC
  Administered 2014-08-13: 3 [IU] via SUBCUTANEOUS

## 2014-08-12 MED ORDER — PROPOFOL 10 MG/ML IV BOLUS
INTRAVENOUS | Status: AC
  Filled 2014-08-12: qty 20

## 2014-08-12 MED ORDER — ATORVASTATIN CALCIUM 20 MG PO TABS
20.0000 mg | ORAL_TABLET | Freq: Every day | ORAL | Status: DC
  Administered 2014-08-12: 20 mg via ORAL
  Filled 2014-08-12 (×2): qty 1

## 2014-08-12 MED ORDER — OXYCODONE HCL 5 MG PO TABS
5.0000 mg | ORAL_TABLET | Freq: Once | ORAL | Status: AC | PRN
  Administered 2014-08-12: 5 mg via ORAL

## 2014-08-12 MED ORDER — SODIUM CHLORIDE 0.9 % IV SOLN: INTRAVENOUS | Status: DC

## 2014-08-12 MED ORDER — FENTANYL CITRATE 0.05 MG/ML IJ SOLN
INTRAMUSCULAR | Status: DC | PRN
  Administered 2014-08-12 (×2): 50 ug via INTRAVENOUS

## 2014-08-12 MED ORDER — BUPIVACAINE-EPINEPHRINE (PF) 0.5% -1:200000 IJ SOLN
INTRAMUSCULAR | Status: DC | PRN
  Administered 2014-08-12: 30 mL via PERINEURAL

## 2014-08-12 MED ORDER — MIDAZOLAM HCL 5 MG/5ML IJ SOLN
INTRAMUSCULAR | Status: DC | PRN
  Administered 2014-08-12: 4 mg via INTRAVENOUS

## 2014-08-12 MED ORDER — ONDANSETRON HCL 4 MG PO TABS: 4.0000 mg | ORAL_TABLET | Freq: Four times a day (QID) | ORAL | Status: DC | PRN

## 2014-08-12 MED ORDER — LIDOCAINE HCL (CARDIAC) 20 MG/ML IV SOLN
INTRAVENOUS | Status: AC
  Filled 2014-08-12: qty 5

## 2014-08-12 MED ORDER — ASPIRIN EC 81 MG PO TBEC
81.0000 mg | DELAYED_RELEASE_TABLET | Freq: Every day | ORAL | Status: DC
  Administered 2014-08-13: 81 mg via ORAL
  Filled 2014-08-12: qty 1

## 2014-08-12 MED ORDER — INSULIN ASPART 100 UNIT/ML ~~LOC~~ SOLN
4.0000 [IU] | Freq: Three times a day (TID) | SUBCUTANEOUS | Status: DC
  Administered 2014-08-13: 4 [IU] via SUBCUTANEOUS

## 2014-08-12 MED ORDER — PHENYLEPHRINE HCL 10 MG/ML IJ SOLN
INTRAMUSCULAR | Status: AC
  Filled 2014-08-12: qty 1

## 2014-08-12 MED ORDER — NEOSTIGMINE METHYLSULFATE 10 MG/10ML IV SOLN
INTRAVENOUS | Status: DC | PRN
  Administered 2014-08-12: 3 mg via INTRAVENOUS

## 2014-08-12 MED ORDER — SODIUM CHLORIDE 0.9 % IV SOLN
10.0000 mg | INTRAVENOUS | Status: DC | PRN
  Administered 2014-08-12: 25 ug/min via INTRAVENOUS

## 2014-08-12 MED ORDER — INSULIN ASPART 100 UNIT/ML ~~LOC~~ SOLN: 0.0000 [IU] | Freq: Every day | SUBCUTANEOUS | Status: DC

## 2014-08-12 MED ORDER — ROCURONIUM BROMIDE 100 MG/10ML IV SOLN
INTRAVENOUS | Status: DC | PRN
  Administered 2014-08-12: 50 mg via INTRAVENOUS

## 2014-08-12 MED ORDER — MIDAZOLAM HCL 2 MG/2ML IJ SOLN
INTRAMUSCULAR | Status: AC
  Filled 2014-08-12: qty 2

## 2014-08-12 MED ORDER — GLYCOPYRROLATE 0.2 MG/ML IJ SOLN
INTRAMUSCULAR | Status: AC
  Filled 2014-08-12: qty 5

## 2014-08-12 MED ORDER — ROCURONIUM BROMIDE 50 MG/5ML IV SOLN
INTRAVENOUS | Status: AC
  Filled 2014-08-12: qty 1

## 2014-08-12 MED ORDER — METOCLOPRAMIDE HCL 10 MG PO TABS: 5.0000 mg | ORAL_TABLET | Freq: Three times a day (TID) | ORAL | Status: DC | PRN

## 2014-08-12 MED ORDER — PHENOL 1.4 % MT LIQD: 1.0000 | OROMUCOSAL | Status: DC | PRN

## 2014-08-12 MED ORDER — HYDROMORPHONE HCL 1 MG/ML IJ SOLN
INTRAMUSCULAR | Status: AC
  Administered 2014-08-12: 0.5 mg via INTRAVENOUS
  Filled 2014-08-12: qty 1

## 2014-08-12 MED ORDER — OXYCODONE-ACETAMINOPHEN 5-325 MG PO TABS: 1.0000 | ORAL_TABLET | ORAL | Status: DC | PRN

## 2014-08-12 MED ORDER — LIDOCAINE HCL (CARDIAC) 20 MG/ML IV SOLN
INTRAVENOUS | Status: DC | PRN
  Administered 2014-08-12: 100 mg via INTRAVENOUS

## 2014-08-12 MED ORDER — METHOCARBAMOL 500 MG PO TABS
500.0000 mg | ORAL_TABLET | Freq: Four times a day (QID) | ORAL | Status: DC | PRN
  Administered 2014-08-13 (×2): 500 mg via ORAL
  Filled 2014-08-12 (×2): qty 1

## 2014-08-12 MED ORDER — METOCLOPRAMIDE HCL 5 MG/ML IJ SOLN: 5.0000 mg | Freq: Three times a day (TID) | INTRAMUSCULAR | Status: DC | PRN

## 2014-08-12 MED ORDER — SODIUM CHLORIDE 0.9 % IJ SOLN
INTRAMUSCULAR | Status: AC
  Filled 2014-08-12: qty 10

## 2014-08-12 MED ORDER — GELATIN ABSORBABLE MT POWD
OROMUCOSAL | Status: DC | PRN
  Administered 2014-08-12: 50000 mL via TOPICAL

## 2014-08-12 MED ORDER — 0.9 % SODIUM CHLORIDE (POUR BTL) OPTIME
TOPICAL | Status: DC | PRN
  Administered 2014-08-12 (×2): 1000 mL

## 2014-08-12 MED ORDER — ACETAMINOPHEN 650 MG RE SUPP: 650.0000 mg | Freq: Four times a day (QID) | RECTAL | Status: DC | PRN

## 2014-08-12 MED ORDER — HYDROMORPHONE HCL 1 MG/ML IJ SOLN
0.2500 mg | INTRAMUSCULAR | Status: DC | PRN
  Administered 2014-08-12 (×2): 0.5 mg via INTRAVENOUS

## 2014-08-12 MED ORDER — DEXTROSE 5 % IV SOLN
500.0000 mg | INTRAVENOUS | Status: AC
  Administered 2014-08-12: 500 mg via INTRAVENOUS
  Filled 2014-08-12: qty 5

## 2014-08-12 MED ORDER — BUPIVACAINE-EPINEPHRINE (PF) 0.25% -1:200000 IJ SOLN
INTRAMUSCULAR | Status: AC
  Filled 2014-08-12: qty 30

## 2014-08-12 MED ORDER — ARTIFICIAL TEARS OP OINT
TOPICAL_OINTMENT | OPHTHALMIC | Status: DC | PRN
  Administered 2014-08-12: 1 via OPHTHALMIC

## 2014-08-12 MED ORDER — GLYCOPYRROLATE 0.2 MG/ML IJ SOLN
INTRAMUSCULAR | Status: DC | PRN
  Administered 2014-08-12: .6 mg via INTRAVENOUS
  Administered 2014-08-12: 0.2 mg via INTRAVENOUS

## 2014-08-12 MED ORDER — FENTANYL CITRATE 0.05 MG/ML IJ SOLN
INTRAMUSCULAR | Status: AC
  Filled 2014-08-12: qty 5

## 2014-08-12 MED ORDER — ONDANSETRON HCL 4 MG/2ML IJ SOLN
4.0000 mg | Freq: Four times a day (QID) | INTRAMUSCULAR | Status: AC | PRN
  Administered 2014-08-12: 4 mg via INTRAVENOUS

## 2014-08-12 MED ORDER — EPHEDRINE SULFATE 50 MG/ML IJ SOLN
INTRAMUSCULAR | Status: DC | PRN
  Administered 2014-08-12: 10 mg via INTRAVENOUS

## 2014-08-12 MED ORDER — LACTATED RINGERS IV SOLN
INTRAVENOUS | Status: DC | PRN
  Administered 2014-08-12 (×2): via INTRAVENOUS

## 2014-08-12 MED ORDER — HYDROCHLOROTHIAZIDE 25 MG PO TABS
25.0000 mg | ORAL_TABLET | Freq: Every day | ORAL | Status: DC
  Administered 2014-08-13: 25 mg via ORAL
  Filled 2014-08-12: qty 1

## 2014-08-12 MED ORDER — OXYCODONE HCL 5 MG/5ML PO SOLN: 5.0000 mg | Freq: Once | ORAL | Status: AC | PRN

## 2014-08-12 MED ORDER — AMLODIPINE BESYLATE 10 MG PO TABS
10.0000 mg | ORAL_TABLET | Freq: Every morning | ORAL | Status: DC
  Administered 2014-08-13: 10 mg via ORAL
  Filled 2014-08-12: qty 1

## 2014-08-12 MED ORDER — ONDANSETRON HCL 4 MG/2ML IJ SOLN: 4.0000 mg | Freq: Four times a day (QID) | INTRAMUSCULAR | Status: DC | PRN

## 2014-08-12 MED ORDER — PROPOFOL 10 MG/ML IV BOLUS
INTRAVENOUS | Status: DC | PRN
  Administered 2014-08-12: 200 mg via INTRAVENOUS

## 2014-08-12 MED ORDER — ONDANSETRON HCL 4 MG/2ML IJ SOLN
INTRAMUSCULAR | Status: DC | PRN
  Administered 2014-08-12: 4 mg via INTRAVENOUS

## 2014-08-12 MED ORDER — ONDANSETRON HCL 4 MG/2ML IJ SOLN
INTRAMUSCULAR | Status: AC
  Administered 2014-08-12: 4 mg via INTRAVENOUS
  Filled 2014-08-12: qty 2

## 2014-08-12 MED ORDER — ACETAMINOPHEN 325 MG PO TABS: 650.0000 mg | ORAL_TABLET | Freq: Four times a day (QID) | ORAL | Status: DC | PRN

## 2014-08-12 MED ORDER — OXYCODONE-ACETAMINOPHEN 5-325 MG PO TABS
1.0000 | ORAL_TABLET | ORAL | Status: DC | PRN
  Administered 2014-08-12 – 2014-08-13 (×5): 2 via ORAL
  Filled 2014-08-12 (×5): qty 2

## 2014-08-12 MED ORDER — ONDANSETRON HCL 4 MG/2ML IJ SOLN
INTRAMUSCULAR | Status: AC
  Filled 2014-08-12: qty 2

## 2014-08-12 MED ORDER — HYDROMORPHONE HCL 1 MG/ML IJ SOLN
0.5000 mg | INTRAMUSCULAR | Status: DC | PRN
  Administered 2014-08-12 – 2014-08-13 (×4): 1 mg via INTRAVENOUS
  Filled 2014-08-12 (×4): qty 1

## 2014-08-12 SURGICAL SUPPLY — 73 items
BLADE SAW SAG 73X25 THK (BLADE) ×1
BLADE SAW SGTL 73X25 THK (BLADE) ×1 IMPLANT
BUR SURG 4X8 MED (BURR) IMPLANT
BURR SURG 4X8 MED (BURR)
CAP KNEE TOTAL 3 SIGMA ×2 IMPLANT
CEMENT BONE DEPUY (Cement) ×2 IMPLANT
COVER SURGICAL LIGHT HANDLE (MISCELLANEOUS) ×2 IMPLANT
DRAPE IMP U-DRAPE 54X76 (DRAPES) ×4 IMPLANT
DRAPE INCISE IOBAN 66X45 STRL (DRAPES) ×6 IMPLANT
DRAPE U-SHAPE 47X51 STRL (DRAPES) ×2 IMPLANT
DRAPE X-RAY CASS 24X20 (DRAPES) IMPLANT
DRILL BIT 5/64 (BIT) ×2 IMPLANT
DRSG ADAPTIC 3X8 NADH LF (GAUZE/BANDAGES/DRESSINGS) ×2 IMPLANT
DRSG PAD ABDOMINAL 8X10 ST (GAUZE/BANDAGES/DRESSINGS) ×4 IMPLANT
DURAPREP 26ML APPLICATOR (WOUND CARE) ×2 IMPLANT
ELECT BLADE 4.0 EZ CLEAN MEGAD (MISCELLANEOUS) ×2
ELECT NEEDLE TIP 2.8 STRL (NEEDLE) ×2 IMPLANT
ELECT REM PT RETURN 9FT ADLT (ELECTROSURGICAL) ×2
ELECTRODE BLDE 4.0 EZ CLN MEGD (MISCELLANEOUS) ×1 IMPLANT
ELECTRODE REM PT RTRN 9FT ADLT (ELECTROSURGICAL) ×1 IMPLANT
GAUZE SPONGE 4X4 12PLY STRL (GAUZE/BANDAGES/DRESSINGS) ×2 IMPLANT
GLOVE BIOGEL PI IND STRL 7.0 (GLOVE) ×1 IMPLANT
GLOVE BIOGEL PI INDICATOR 7.0 (GLOVE) ×1
GLOVE BIOGEL PI ORTHO PRO 7.5 (GLOVE) ×1
GLOVE BIOGEL PI ORTHO PRO SZ8 (GLOVE) ×1
GLOVE ECLIPSE 6.5 STRL STRAW (GLOVE) ×2 IMPLANT
GLOVE ORTHO TXT STRL SZ7.5 (GLOVE) ×2 IMPLANT
GLOVE PI ORTHO PRO STRL 7.5 (GLOVE) ×1 IMPLANT
GLOVE PI ORTHO PRO STRL SZ8 (GLOVE) ×1 IMPLANT
GLOVE SURG ORTHO 8.5 STRL (GLOVE) ×4 IMPLANT
GOWN STRL REUS W/ TWL XL LVL3 (GOWN DISPOSABLE) ×3 IMPLANT
GOWN STRL REUS W/TWL XL LVL3 (GOWN DISPOSABLE) ×3
HANDPIECE INTERPULSE COAX TIP (DISPOSABLE)
IMMOBILIZER SHOULDER FOAM XLGE (SOFTGOODS) ×2 IMPLANT
KIT BASIN OR (CUSTOM PROCEDURE TRAY) ×2 IMPLANT
KIT ROOM TURNOVER OR (KITS) ×2 IMPLANT
MANIFOLD NEPTUNE II (INSTRUMENTS) ×2 IMPLANT
NDL SUT 6 .5 CRC .975X.05 MAYO (NEEDLE) ×1 IMPLANT
NEEDLE 1/2 CIR MAYO (NEEDLE) ×2 IMPLANT
NEEDLE HYPO 25GX1X1/2 BEV (NEEDLE) ×2 IMPLANT
NEEDLE MAYO TAPER (NEEDLE) ×1
NS IRRIG 1000ML POUR BTL (IV SOLUTION) ×4 IMPLANT
PACK SHOULDER (CUSTOM PROCEDURE TRAY) ×2 IMPLANT
PACK UNIVERSAL I (CUSTOM PROCEDURE TRAY) IMPLANT
PAD ARMBOARD 7.5X6 YLW CONV (MISCELLANEOUS) ×4 IMPLANT
PASSER SUT SWANSON 36MM LOOP (INSTRUMENTS) ×2 IMPLANT
PIN METAGLENE 2.5 (PIN) IMPLANT
RETRIEVER SUT HEWSON (MISCELLANEOUS) IMPLANT
SET HNDPC FAN SPRY TIP SCT (DISPOSABLE) IMPLANT
SLING ARM IMMOBILIZER LRG (SOFTGOODS) ×2 IMPLANT
SMARTMIX MINI TOWER (MISCELLANEOUS) ×2
SPONGE GAUZE 4X4 12PLY STER LF (GAUZE/BANDAGES/DRESSINGS) ×2 IMPLANT
SPONGE LAP 18X18 X RAY DECT (DISPOSABLE) ×2 IMPLANT
SPONGE LAP 4X18 X RAY DECT (DISPOSABLE) ×2 IMPLANT
SPONGE SURGIFOAM ABS GEL SZ50 (HEMOSTASIS) ×2 IMPLANT
STRIP CLOSURE SKIN 1/2X4 (GAUZE/BANDAGES/DRESSINGS) ×2 IMPLANT
SUCTION FRAZIER TIP 10 FR DISP (SUCTIONS) ×2 IMPLANT
SUT FIBERWIRE #2 38 T-5 BLUE (SUTURE) ×12
SUT MNCRL AB 4-0 PS2 18 (SUTURE) ×2 IMPLANT
SUT VIC AB 0 CT1 27 (SUTURE) ×1
SUT VIC AB 0 CT1 27XBRD ANBCTR (SUTURE) ×1 IMPLANT
SUT VIC AB 2-0 CT1 27 (SUTURE) ×1
SUT VIC AB 2-0 CT1 TAPERPNT 27 (SUTURE) ×1 IMPLANT
SUT VICRYL AB 2 0 TIES (SUTURE) ×2 IMPLANT
SUTURE FIBERWR #2 38 T-5 BLUE (SUTURE) ×6 IMPLANT
SYR CONTROL 10ML LL (SYRINGE) ×2 IMPLANT
TAPE CLOTH SURG 6X10 WHT LF (GAUZE/BANDAGES/DRESSINGS) ×2 IMPLANT
TOWEL OR 17X24 6PK STRL BLUE (TOWEL DISPOSABLE) ×2 IMPLANT
TOWEL OR 17X26 10 PK STRL BLUE (TOWEL DISPOSABLE) ×2 IMPLANT
TOWER SMARTMIX MINI (MISCELLANEOUS) ×1 IMPLANT
TRAY FOLEY CATH 16FRSI W/METER (SET/KITS/TRAYS/PACK) IMPLANT
WATER STERILE IRR 1000ML POUR (IV SOLUTION) ×2 IMPLANT
YANKAUER SUCT BULB TIP NO VENT (SUCTIONS) IMPLANT

## 2014-08-12 NOTE — Progress Notes (Signed)
PT is able to place on and off himself. Encouraged pt to call if needing any assistance.

## 2014-08-12 NOTE — Discharge Instructions (Signed)
Ice to the shoulder at all times.  Keep pillow or blanket behind the elbow to keep the arm across your waist  Ok to remove the sling and move the arm actively and with assistance  No pushing , pulling, or lifting with the left arm.   Keep the incision clean and dry for one week, then ok to get wet in the shower.  Follow up in two weeks with Dr Veverly Fells  [phone]  Call Dr Larina Earthly for any questions  [phone]

## 2014-08-12 NOTE — Anesthesia Procedure Notes (Addendum)
Procedure Name: Intubation Date/Time: 08/12/2014 7:42 AM Performed by: Neldon Newport Pre-anesthesia Checklist: Patient identified, Timeout performed, Emergency Drugs available, Suction available and Patient being monitored Patient Re-evaluated:Patient Re-evaluated prior to inductionOxygen Delivery Method: Circle system utilized Preoxygenation: Pre-oxygenation with 100% oxygen Intubation Type: IV induction Ventilation: Mask ventilation without difficulty Laryngoscope Size: Mac and 4 Grade View: Grade I Tube type: Oral Tube size: 8.0 mm Number of attempts: 1 Placement Confirmation: positive ETCO2,  ETT inserted through vocal cords under direct vision and breath sounds checked- equal and bilateral Secured at: 23 cm Tube secured with: Tape Dental Injury: Teeth and Oropharynx as per pre-operative assessment    Anesthesia Regional Block:  Interscalene brachial plexus block  Pre-Anesthetic Checklist: ,, timeout performed, Correct Patient, Correct Site, Correct Laterality, Correct Procedure, Correct Position, site marked, Risks and benefits discussed,  Surgical consent,  Pre-op evaluation,  At surgeon's request and post-op pain management  Laterality: Left  Prep: chloraprep       Needles:  Injection technique: Single-shot  Needle Type: Echogenic Stimulator Needle     Needle Length: 5cm 5 cm Needle Gauge: 22 and 22 G    Additional Needles:  Procedures: ultrasound guided (picture in chart) and nerve stimulator Interscalene brachial plexus block  Nerve Stimulator or Paresthesia:  Response: biceps flexion, 0.45 mA,   Additional Responses:   Narrative:  Start time: 08/12/2014 7:01 AM End time: 08/12/2014 7:12 AM Injection made incrementally with aspirations every 5 mL.  Performed by: Personally  Anesthesiologist: Cordarrell Sane  Additional Notes: Functioning IV was confirmed and monitors were applied.  A 3mm 22ga Arrow echogenic stimulator needle was used. Sterile prep  and drape,hand hygiene and sterile gloves were used.  Negative aspiration and negative test dose prior to incremental administration of local anesthetic. The patient tolerated the procedure well.  Ultrasound guidance: relevent anatomy identified, needle position confirmed, local anesthetic spread visualized around nerve(s), vascular puncture avoided.  Image printed for medical record.

## 2014-08-12 NOTE — Interval H&P Note (Signed)
History and Physical Interval Note:  08/12/2014 7:27 AM  Rodney Hayes  has presented today for surgery, with the diagnosis of LEFT SHOULDER OA   The various methods of treatment have been discussed with the patient and family. After consideration of risks, benefits and other options for treatment, the patient has consented to  Procedure(s): LEFT TOTAL SHOULDER ARTHROPLASTY (Left) as a surgical intervention .  The patient's history has been reviewed, patient examined, no change in status, stable for surgery.  I have reviewed the patient's chart and labs.  Questions were answered to the patient's satisfaction.     Machelle Raybon,STEVEN R

## 2014-08-12 NOTE — Anesthesia Postprocedure Evaluation (Signed)
Anesthesia Post Note  Patient: Rodney Hayes  Procedure(s) Performed: Procedure(s) (LRB): LEFT TOTAL SHOULDER ARTHROPLASTY (Left)  Anesthesia type: General  Patient location: PACU  Post pain: Pain level controlled and Adequate analgesia  Post assessment: Post-op Vital signs reviewed, Patient's Cardiovascular Status Stable, Respiratory Function Stable, Patent Airway and Pain level controlled  Last Vitals:  Filed Vitals:   08/12/14 1130  BP: 123/57  Pulse: 74  Temp:   Resp: 19    Post vital signs: Reviewed and stable  Level of consciousness: awake, alert  and oriented  Complications: No apparent anesthesia complications

## 2014-08-12 NOTE — Brief Op Note (Signed)
08/12/2014  10:44 AM  PATIENT:  Rodney Hayes  64 y.o. male  PRE-OPERATIVE DIAGNOSIS:  LEFT SHOULDER OA, END STAGE  POST-OPERATIVE DIAGNOSIS:  LEFT SHOULDER OA, END STAGE  PROCEDURE:  Procedure(s): LEFT TOTAL SHOULDER ARTHROPLASTY (Left) [employer]  SURGEON:  Surgeon(s) and Role:    * [name], MD - Primary  PHYSICIAN ASSISTANT:   ASSISTANTS: Ventura Bruns, PA-C   ANESTHESIA:   regional and general  EBL:  Total I/O In: 2025 [I.V.:2025] Out: 450 [Blood:450]  BLOOD ADMINISTERED:none  DRAINS: none   LOCAL MEDICATIONS USED:  MARCAINE     SPECIMEN:  No Specimen  DISPOSITION OF SPECIMEN:  N/A  COUNTS:  YES  TOURNIQUET:  * No tourniquets in log *  DICTATION: .Other Dictation: Dictation Number [id number]  PLAN OF CARE: Admit to inpatient   PATIENT DISPOSITION:  PACU - hemodynamically stable.   Delay start of Pharmacological VTE agent (>24hrs) due to surgical blood loss or risk of bleeding: yes

## 2014-08-12 NOTE — Progress Notes (Signed)
Sharyn Lull, Agricultural consultant, took belongings (clothes & ipad) to PACU.

## 2014-08-12 NOTE — Anesthesia Preprocedure Evaluation (Addendum)
Anesthesia Evaluation  Patient identified by MRN, date of birth, ID band Patient awake    Reviewed: Allergy & Precautions, NPO status , Patient's Chart, lab work & pertinent test results  Airway Mallampati: II   Neck ROM: full    Dental  (+) Teeth Intact, Implants, Dental Advidsory Given   Pulmonary shortness of breath, asthma , sleep apnea , former smoker,  breath sounds clear to auscultation        Cardiovascular hypertension, + Peripheral Vascular Disease Rhythm:regular Rate:Normal     Neuro/Psych    GI/Hepatic (+) B  Endo/Other  diabetes, Type 2Morbid obesity  Renal/GU      Musculoskeletal  (+) Arthritis -,   Abdominal   Peds  Hematology   Anesthesia Other Findings   Reproductive/Obstetrics                            Anesthesia Physical Anesthesia Plan  ASA: II  Anesthesia Plan: General and Regional   Post-op Pain Management: MAC Combined w/ Regional for Post-op pain   Induction: Intravenous  Airway Management Planned: Oral ETT  Additional Equipment:   Intra-op Plan:   Post-operative Plan: Extubation in OR  Informed Consent: I have reviewed the patients History and Physical, chart, labs and discussed the procedure including the risks, benefits and alternatives for the proposed anesthesia with the patient or authorized representative who has indicated his/her understanding and acceptance.   Dental Advisory Given  Plan Discussed with: CRNA, Anesthesiologist and Surgeon  Anesthesia Plan Comments:        Anesthesia Quick Evaluation

## 2014-08-12 NOTE — Transfer of Care (Signed)
Immediate Anesthesia Transfer of Care Note  Patient: Rodney Hayes  Procedure(s) Performed: Procedure(s): LEFT TOTAL SHOULDER ARTHROPLASTY (Left)  Patient Location: PACU  Anesthesia Type:General  Level of Consciousness: awake, alert  and oriented  Airway & Oxygen Therapy: Patient Spontanous Breathing and Patient connected to face mask oxygen  Post-op Assessment: Report given to RN, Post -op Vital signs reviewed and stable and Patient moving all extremities X 4  Post vital signs: Reviewed and stable  Last Vitals:  Filed Vitals:   08/12/14 0611  BP: 145/49  Pulse: 64  Temp: 37.1 C  Resp: 20    Complications: No apparent anesthesia complications

## 2014-08-13 LAB — BASIC METABOLIC PANEL
ANION GAP: 8 (ref 5–15)
BUN: 10 mg/dL (ref 6–23)
CALCIUM: 8.8 mg/dL (ref 8.4–10.5)
CO2: 29 mmol/L (ref 19–32)
Chloride: 95 mmol/L — ABNORMAL LOW (ref 96–112)
Creatinine, Ser: 0.83 mg/dL (ref 0.50–1.35)
GFR calc Af Amer: >90 mL/min (ref >90)
GLUCOSE: 149 mg/dL — AB (ref 70–99)
POTASSIUM: 3.8 mmol/L (ref 3.5–5.1)
Sodium: 132 mmol/L — ABNORMAL LOW (ref 135–145)

## 2014-08-13 LAB — HEMOGLOBIN AND HEMATOCRIT, BLOOD
HCT: 34 % — ABNORMAL LOW (ref 39.0–52.0)
HEMOGLOBIN: 11.3 g/dL — AB (ref 13.0–17.0)

## 2014-08-13 LAB — GLUCOSE, CAPILLARY: Glucose-Capillary: 168 mg/dL — ABNORMAL HIGH (ref 70–99)

## 2014-08-13 NOTE — Progress Notes (Signed)
Orthopedics Progress Note  Subjective: Pain well controlled this morning  Objective:  Filed Vitals:   08/13/14 0522  BP: 144/50  Pulse: 78  Temp: 98.9 F (37.2 C)  Resp:     General: Awake and alert  Musculoskeletal: left shoulder dressing CDI, NVI including ax nerve Neurovascularly intact  Lab Results  Component Value Date   WBC 7.6 08/02/2014   HGB 11.3* 08/13/2014   HCT 34.0* 08/13/2014   MCV 89.9 08/02/2014   PLT 208 08/02/2014       Component Value Date/Time   NA 132* 08/13/2014 0531   K 3.8 08/13/2014 0531   CL 95* 08/13/2014 0531   CO2 29 08/13/2014 0531   GLUCOSE 149* 08/13/2014 0531   BUN 10 08/13/2014 0531   CREATININE 0.83 08/13/2014 0531   CREATININE 0.87 02/15/2014 0721   CALCIUM 8.8 08/13/2014 0531   GFRNONAA >90 08/13/2014 0531   GFRAA >90 08/13/2014 0531    Lab Results  Component Value Date   INR 0.91 01/06/2013   INR 0.9 12/17/2006    Assessment/Plan: POD #1 s/p Procedure(s): LEFT TOTAL SHOULDER ARTHROPLASTY Home today with home exercises.  No home health needed.  Doran Heater. Veverly Fells, MD 08/13/2014 7:43 AM

## 2014-08-13 NOTE — Op Note (Signed)
NAMECORMAC, WINT NO.:  [id number]  MEDICAL RECORD NO.:  [id number]  LOCATION:  5N21C                        FACILITY:  [facility]  PHYSICIAN:  Doran Heater. Veverly Fells, M.D. DATE OF BIRTH:  14-Aug-1950  DATE OF PROCEDURE:  08/12/2014 DATE OF DISCHARGE:                              OPERATIVE REPORT   PREOPERATIVE DIAGNOSIS:  Left shoulder end-stage osteoarthritis.  POSTOPERATIVE DIAGNOSIS:  Left shoulder end-stage osteoarthritis.  PROCEDURE PERFORMED:  Left total shoulder arthroplasty using DePuy Global AP system.  ATTENDING SURGEON:  Doran Heater. Veverly Fells, M.D.  ASSISTANT:  Abbott Pao. Dixon, PA-C, who scrubbed the entire procedure and necessary for satisfactory completion of surgery.  ANESTHESIA:  General anesthesia was used plus interscalene block.  ESTIMATED BLOOD LOSS:  150 mL.  FLUID REPLACEMENT:  1500 mL of crystalloid.  INSTRUMENT COUNTS:  Correct.  COMPLICATIONS:  There were no complications.  ANTIBIOTICS:  Perioperative antibiotics were given.  INDICATIONS:  The patient is a 64 year old male with worsening left shoulder pain secondary to end-stage arthritis.  The patient has had progressive pain despite conservative management including injections, modification of activity, and presents now for total shoulder arthroplasty to relieve pain and restore function.  Informed consent was obtained.  DESCRIPTION OF PROCEDURE:  After an adequate level of anesthesia was achieved, the patient was positioned in the modified beach-chair position.  Left shoulder correctly identified, sterilely prepped and draped in usual manner.  Time-out was called.  We entered the shoulder using a standard deltopectoral incision starting at the coracoid process extending down to the anterior humerus.  Dissection down through subcutaneous tissues using the needle-tip Bovie.  I identified the cephalic vein and took it laterally, the deltoid pectoralis taken medially.  Conjoined tendon  identified and retracted medially.  We identified the subscapularis, released it subperiosteally off the lesser tuberosity tagging it for repair at the end.  We then went ahead and did a capsular release inferiorly off the humeral neck allowing for visualization of the humeral head, which had advanced osteoarthritis and large spurs.  We placed our T-handled Crego elevator over the top of the articular portion of the humeral head retracting the rotator cuff and a large Crego elevator around the inferior medial neck.  We then used a neck cut guide and placed the elbow at the patient's side and externally rotated the forearm proximally 20 degrees and made our neck cut, flushed with the insertion of the rotator cuff.  We removed excess osteophytes off the humeral neck around at the back of the shoulder.  We then went ahead and prepared the humeral shaft with sequential broaching up to a size 12 APG stem.  Once we had that stem trialed, then we removed the stem, subluxed the humerus posteriorly, did a 360-degree capsular removal protecting the axillary nerve.  We removed the glenoid labrum and the biceps tendon and then found our center point for glenoid preparation.  We drilled our center guide pin and then reamed for the 48 glenoid.  Once we had our reaming done, we did our peripheral hand reaming and then drilled our center PEG hole, irrigated, and then used that for our guide to drill our peripheral holes superiorly and  2 inferiorly guiding off the inferior scapular neck to make sure we had our 12 and 6 o'clock position appropriate.  Once we had our 3 peripheral holes drilled, we placed our trial prosthesis APG, this is a global APG socket.  We impacted that in place, we were pleased with that fit.  We removed the trial APG socket and then placed our epi soaked Gelfoam in the 3 peripheral holes and we used vacuum mixing for the cement, this is a DePuy 1 cement and cemented the 3 peripheral  holes and impacted the real 48 APG glenoid component into place and held that until all cement was hard.  We then went ahead and placed our drill holes around the lesser tuberosity and placed #2 FiberWire suture for repair of the subscap anatomically the bone.  Once we had our sutures placed x3, these were mattress sutures, we went ahead and used impaction grafting technique.  We had irrigated the humeral canal and used impaction grafting with available bone graft in the humeral head and impacted our 12 APG stem into position with 12 metaphyseal component.  Once that was in position, we selected the appropriate coverage, which is 52 eccentric and the appropriate width, which was 21.  We went ahead and reduced that and we had nice soft tissue balancing with ability to displace the humerus 50% of the glenoid diameter posteriorly and inferiorly, so we selected the real 52, 21 eccentric and impacted that in position, reduced the shoulder and did an anatomic subscap repair to bone with #2 FiberWire including the rotator interval.  We tenodesed the biceps as well, mid tension with the elbow at 90 degrees and sutured that into the pec tendon and also sutures through bone up through the biceps tendon. We irrigated thoroughly the deltopectoral interval and repaired the deltopectoral interval with 0-Vicryl suture followed by 2-0 Vicryl subcutaneous closure and 4-0 Monocryl for skin.  Steri-Strips applied followed by sterile dressing.  The patient tolerated the surgery well.     Doran Heater. Veverly Fells, M.D.     SRN/MEDQ  D:  08/12/2014  T:  08/13/2014  Job:  [id number]

## 2014-08-13 NOTE — Evaluation (Addendum)
Occupational Therapy Evaluation Patient Details Name: Rodney Hayes MRN: [id number] DOB: 09-28-1950 Today's Date: 08/13/2014    History of Present Illness 64 y.o. left TSA.   Clinical Impression   Pt s/p above. Education provided to pt and spouse and handouts provided. OT signing off.    Follow Up Recommendations  No OT follow up;Supervision - Intermittent    Equipment Recommendations  None recommended by OT    Recommendations for Other Services       Precautions / Restrictions Precautions Precautions: Shoulder Type of Shoulder Precautions: no pushing, pulling, lifting with Lt UE (can use to hold light items), Dr. Veverly Fells TSA protocol (FF to 90 degrees and IR/ER rotation to neutral) Shoulder Interventions: Shoulder sling/immobilizer;Off for dressing/bathing/exercises Precaution Booklet Issued: Yes (comment) Precaution Comments: educated on precautions Required Braces or Orthoses: Sling Restrictions Weight Bearing Restrictions: Yes LUE Weight Bearing: Non weight bearing      Mobility Bed Mobility Overal bed mobility: Modified Independent                Transfers Overall transfer level: Needs assistance   Transfers: Sit to/from Stand Sit to Stand: Supervision;Modified independent (Device/Increase time)         General transfer comment: cues to be sure not to put weight on Lt UE         ADL Overall ADL's : Needs assistance/impaired                         Toilet Transfer: Modified Independent;Ambulation (bed)           Functional mobility during ADLs: Modified independent General ADL Comments: Practiced donning shirt (spouse assisted pt)-educated that button up shirts will probably be easier to manage or shirt with large neck. Recommended spouse be with pt for shower transfer. Suggested him sitting on a chair for LB bathing. Recommended pt sitting for most of LB dressing. Educated on sling.      Vision    Pt wears glasses                  Perception     Praxis      Pertinent Vitals/Pain Pain Assessment: 0-10 Pain Score: 6  Pain Location: left shoulder Pain Intervention(s): Patient requesting pain meds-RN notified;Monitored during session     Hand Dominance Right   Extremity/Trunk Assessment Upper Extremity Assessment Upper Extremity Assessment: LUE deficits/detail LUE Deficits / Details: left TSA   Lower Extremity Assessment Lower Extremity Assessment: Overall WFL for tasks assessed       Communication Communication Communication: No difficulties   Cognition Arousal/Alertness: Awake/alert Behavior During Therapy: WFL for tasks assessed/performed Overall Cognitive Status: Within Functional Limits for tasks assessed                     General Comments       Exercises Exercises: Other exercises;Shoulder Other Exercises Other Exercises: performed pendulum exercises and also performed a few reps of lap slides with left UE that Dr. Veverly Fells showed him Other Exercises: performed AROM left elbow flexion/extension, wrist flexion/extension, and digit composite flexion/extension (approximately 10 reps each) Other Exercises: performed left AAROM IR/ER standing and in supine (per wife, MD stated to go to neutral position); few reps of AROM left forearm supination/pronation Other Exercises: performed left AAROM shoulder flexion in supine Other Exercises: moved neck around-discussed how he could have stiffness in neck   Shoulder Instructions Shoulder Instructions Donning/doffing shirt without moving shoulder: Supervision/safety (pt allowed to perform  AAROM of shoulder) Method for sponge bathing under operated UE: Patient able to independently direct caregiver (educated) Donning/doffing sling/immobilizer: Caregiver independent with task (wife assisting pt) Correct positioning of sling/immobilizer: Supervision/safety (educated and pt practiced) Pendulum exercises (written home exercise program):  Minimal assistance (suggested having chair behind him and wife with him) ROM for elbow, wrist and digits of operated UE: Minimal assistance (helped with positioning) Sling wearing schedule (on at all times/off for ADL's): Supervision/safety (educated-cues to recall when he could take sling off) Positioning of UE while sleeping: Patient able to independently direct caregiver    Home Living Family/patient expects to be discharged to:: Private residence Living Arrangements: Spouse/significant other Available Help at Discharge: Family;Available PRN/intermittently Type of Home: House       Home Layout: Two level;Able to live on main level with bedroom/bathroom         Bathroom Toilet: Handicapped height                Prior Functioning/Environment Level of Independence: Independent             OT Diagnosis: Acute pain   OT Problem List:     OT Treatment/Interventions:      OT Goals(Current goals can be found in the care plan section)    OT Frequency:     Barriers to D/C:            Co-evaluation              End of Session Equipment Utilized During Treatment: Other (comment) (sling) Nurse Communication: Patient requests pain meds  Activity Tolerance: Patient tolerated treatment well Patient left: Other (comment);with family/visitor present (up in room with spouse)   Time: 2446-2863 OT Time Calculation (min): 30 min Charges:  OT General Charges $OT Visit: 1 Procedure OT Evaluation $Initial OT Evaluation Tier I: 1 Procedure OT Treatments $Therapeutic Exercise : 8-22 mins G-CodesBenito Hayes  OTR/L [phone]  08/13/2014, 9:39 AM

## 2014-08-13 NOTE — Discharge Summary (Signed)
Physician Discharge Summary   Patient ID: Rodney Hayes MRN: [id number] DOB/AGE: 02-15-1951 64 y.o.  Admit date: 08/12/2014 Discharge date: 08/13/2014  Admission Diagnoses:  Active Problems:   S/P shoulder replacement   Discharge Diagnoses:  Same   Surgeries: Procedure(s): LEFT TOTAL SHOULDER ARTHROPLASTY on 08/12/2014   Consultants: Occupational Therapy  Discharged Condition: Stable  Hospital Course: Rodney Hayes is an 64 y.o. male who was admitted 08/12/2014 with a chief complaint of left shoulder pain, and found to have a diagnosis of left shoulder OA.  They were brought to the operating room on 08/12/2014 and underwent the above named procedures.    The patient had an uncomplicated hospital course and was stable for discharge.  Recent vital signs:  Filed Vitals:   08/13/14 0522  BP: 144/50  Pulse: 78  Temp: 98.9 F (37.2 C)  Resp:     Recent laboratory studies:  Results for orders placed or performed during the hospital encounter of 08/12/14  Glucose, capillary  Result Value Ref Range   Glucose-Capillary 131 (H) 70 - 99 mg/dL  Glucose, capillary  Result Value Ref Range   Glucose-Capillary 144 (H) 70 - 99 mg/dL   Comment 1 Notify RN   Glucose, capillary  Result Value Ref Range   Glucose-Capillary 128 (H) 70 - 99 mg/dL  Hemoglobin and hematocrit, blood  Result Value Ref Range   Hemoglobin 11.3 (L) 13.0 - 17.0 g/dL   HCT 34.0 (L) 39.0 - 32.1 %  Basic metabolic panel  Result Value Ref Range   Sodium 132 (L) 135 - 145 mmol/L   Potassium 3.8 3.5 - 5.1 mmol/L   Chloride 95 (L) 96 - 112 mmol/L   CO2 29 19 - 32 mmol/L   Glucose, Bld 149 (H) 70 - 99 mg/dL   BUN 10 6 - 23 mg/dL   Creatinine, Ser 0.83 0.50 - 1.35 mg/dL   Calcium 8.8 8.4 - 10.5 mg/dL   GFR calc non Af Amer >90 >90 mL/min   GFR calc Af Amer >90 >90 mL/min   Anion gap 8 5 - 15  Glucose, capillary  Result Value Ref Range   Glucose-Capillary 126 (H) 70 - 99 mg/dL   Comment 1 Notify RN     Glucose, capillary  Result Value Ref Range   Glucose-Capillary 168 (H) 70 - 99 mg/dL   Comment 1 Notify RN     Discharge Medications:     Medication List    STOP taking these medications        naproxen sodium 220 MG tablet  Commonly known as:  ANAPROX      TAKE these medications        amLODipine 10 MG tablet  Commonly known as:  NORVASC  Take 1 tablet (10 mg total) by mouth every morning.     aspirin EC 81 MG tablet  Take 1 tablet (81 mg total) by mouth daily.     atorvastatin 20 MG tablet  Commonly known as:  LIPITOR  Take 1 tablet (20 mg total) by mouth daily.     doxazosin 2 MG tablet  Commonly known as:  CARDURA  Take 1 tablet (2 mg total) by mouth daily.     GLUCOSAMINE PO  Take 2 tablets by mouth daily.     hydrochlorothiazide 25 MG tablet  Commonly known as:  HYDRODIURIL  TAKE 1 TABLET BY MOUTH ONCE DAILY     losartan 100 MG tablet  Commonly known as:  COZAAR  Take 100  mg by mouth every morning.     methocarbamol 500 MG tablet  Commonly known as:  ROBAXIN  Take 1 tablet (500 mg total) by mouth 3 (three) times daily as needed.     MULTIVITAMIN PO  Take 1 tablet by mouth daily.     NOVOFINE 32G X 6 MM Misc  Generic drug:  Insulin Pen Needle  USE DAILY AS DIRECTED     ONE TOUCH ULTRA MINI W/DEVICE Kit  Please use to monitor glucose levels. Dx. 250.00     ONE TOUCH ULTRA TEST test strip  Generic drug:  glucose blood  TEST BLOOD GLUCOSE ONCE DAILY ICD L27.80     ONETOUCH DELICA LANCETS 04Y Misc  TEST ONCE DAILY     oxyCODONE-acetaminophen 5-325 MG per tablet  Commonly known as:  ROXICET  Take 1-2 tablets by mouth every 4 (four) hours as needed for severe pain.        Diagnostic Studies: Dg Shoulder Left  08/12/2014   CLINICAL DATA:  Left shoulder arthroplasty, advanced left shoulder degenerative joint disease.  EXAM: LEFT SHOULDER - 2+ VIEW  COMPARISON:  09/22/2013  FINDINGS: Left shoulder hemi arthroplasty changes noted. Humeral head  prosthetic component appears aligned over the glenoid. Degenerative changes of the [facility] joint as well. Postop changes of the soft tissues. No acute osseous finding or hardware abnormality.  IMPRESSION: Status post left shoulder hemiarthroplasty. No acute finding or complicating feature. Expected postoperative appearance.   Electronically Signed   By: Daryll Brod M.D.   On: 08/12/2014 12:05    Disposition: home        Follow-up Information    Follow up with Linday Rhodes,STEVEN R, MD. Call in 2 weeks.   Specialty:  Orthopedic Surgery   Why:  [phone]   Contact information:   [address] [place] [address] [phone]        Signed: [name] 08/13/2014, 7:48 AM

## 2014-08-15 ENCOUNTER — Encounter (HOSPITAL_COMMUNITY): Payer: Self-pay | Admitting: Orthopedic Surgery

## 2014-08-15 ENCOUNTER — Telehealth: Payer: Self-pay | Admitting: *Deleted

## 2014-08-15 NOTE — Progress Notes (Signed)
Utilization review completed.  

## 2014-08-15 NOTE — Telephone Encounter (Signed)
PT WAS ON TCM REPORT WAS D/C ON 08/13/14 HAD LEFT SHOULDER SURGERY WILL BE F/U WITH DR. Veverly Fells...Johny Chess

## 2014-08-22 ENCOUNTER — Telehealth: Payer: Self-pay | Admitting: *Deleted

## 2014-08-22 NOTE — Telephone Encounter (Signed)
Left msg on triage stating wanting md to restart his Komoglzide. He had surgery and now his BS been elevated. Called pt back no answer LMOM will need to make appt he is actually do for his cpx with labs. Must see md before med can be rx again...Rodney Hayes

## 2014-08-22 NOTE — Progress Notes (Signed)
Patient ID: Rodney Hayes, male   DOB: [birth year], 64 y.o.   MRN: [id number] ATTENDING PHYSICIAN NOTE: I have reviewed the chart and agree with the plan as detailed above. Dorcas Mcmurray MD Pager [phone]

## 2014-08-24 ENCOUNTER — Encounter: Payer: Self-pay | Admitting: Internal Medicine

## 2014-08-24 ENCOUNTER — Ambulatory Visit (INDEPENDENT_AMBULATORY_CARE_PROVIDER_SITE_OTHER): Payer: 59 | Admitting: Internal Medicine

## 2014-08-24 VITALS — BP 136/60 | HR 71 | Temp 98.8°F | Ht 71.0 in | Wt 294.2 lb

## 2014-08-24 DIAGNOSIS — E1159 Type 2 diabetes mellitus with other circulatory complications: Secondary | ICD-10-CM

## 2014-08-24 DIAGNOSIS — E785 Hyperlipidemia, unspecified: Secondary | ICD-10-CM

## 2014-08-24 DIAGNOSIS — I10 Essential (primary) hypertension: Secondary | ICD-10-CM

## 2014-08-24 DIAGNOSIS — E1151 Type 2 diabetes mellitus with diabetic peripheral angiopathy without gangrene: Secondary | ICD-10-CM

## 2014-08-24 NOTE — Assessment & Plan Note (Signed)
A1c , urine microalbumin 

## 2014-08-24 NOTE — Assessment & Plan Note (Signed)
Lipids, LFTs, TSH  

## 2014-08-24 NOTE — Progress Notes (Signed)
   Subjective:    Patient ID: Rodney Hayes, male    DOB: July 29, 1950, 64 y.o.   MRN: [id number]  HPI The patient is here to assess status of active health conditions.  PMH, FH, & Social History reviewed & updated.  Beginning in late September 2015 he made major therapeutic lifestyle changes in diet and exercise. He changed from a protein based to a plant-based heart healthy diet, basically vegetarian diet with decreased non vegetable starches.  He's also been exercising 45-60 minutes 1-2 times per day at a high level without symptoms until he developed a left shoulder impingement with severe pain. This progressed to the point that he was unable to exercise and required arthroplastic surgery 08/12/14.  With these changes he was able to come off his diabetic medications. Prior to stopping the medications he had sugars into the 40s.   With discontinuation of exercise and in the context of the muscle skeletal pain his blood pressure has risen. It has ranged from 134/70 to 170/88  Also with the change in exercise his glucoses have began to climb. They've ranged from a fasting of 119 up to 182.  Hypoglycemia not reported Ophthalmologic exam is current ;no retinopathy present.   Prior to the onset of pain he had been able to exercise at the high level without symptoms. He also was evaluated with a cardiac CT which was negative.  With the therapeutic changes his weight dropped  from 312 down to 288.    Review of Systems Polyuria, polyphagia, polydipsia absent.  There is no blurred vision, double vision, or loss of vision.   No postural dizziness noted. Denied are numbness, tingling, or burning of the extremities.  No nonhealing skin lesions present.      Objective:   Physical Exam  General appearance :adequately nourished; in no distress. BMI 41.06  Eyes: No conjunctival inflammation or scleral icterus is present.  Oral exam: Dental hygiene is good. Lips and gums are healthy  appearing.There is no oropharyngeal erythema or exudate noted.   Heart:  Normal rate and regular rhythm. S1 and S2 normal without gallop,  click, rub or other extra sounds  . Grade 8/6-1 systolic murmur.  Lungs:Chest clear to auscultation; no wheezes, rhonchi,rales ,or rubs present.No increased work of breathing.   Abdomen: Protuberant;bowel sounds normal, soft and non-tender without masses, organomegaly or hernias noted.  No guarding or rebound. No flank tenderness to percussion.  Vascular : all pulses equal ; no bruits present.  Skin:Warm & dry.  Intact without suspicious lesions or rashes ; no jaundice or tenting  Lymphatic: No lymphadenopathy is noted about the head, neck, axilla, or inguinal areas.   Neuro: Strength, tone & DTRs normal.  Genitourinary: Genitalia normal except for  varices in left scrotum.. Rectal tone is normal. The prostate is normal in size; there is no induration or nodule present.          Assessment & Plan:  #1 diabetes, questionable status. In January 2015 his A1c was prediabetic. He remains on the plant-based heart healthy diet. Unfortunately he is unable to exercise.  #2 hypertension, aggravated by pain syndrome  #3 dyslipidemia  Plan:see orders

## 2014-08-24 NOTE — Progress Notes (Signed)
Pre visit review using our clinic review tool, if applicable. No additional management support is needed unless otherwise documented below in the visit note. 

## 2014-08-24 NOTE — Patient Instructions (Signed)

## 2014-08-25 ENCOUNTER — Other Ambulatory Visit (INDEPENDENT_AMBULATORY_CARE_PROVIDER_SITE_OTHER): Payer: 59

## 2014-08-25 DIAGNOSIS — E1159 Type 2 diabetes mellitus with other circulatory complications: Secondary | ICD-10-CM

## 2014-08-25 DIAGNOSIS — E1151 Type 2 diabetes mellitus with diabetic peripheral angiopathy without gangrene: Secondary | ICD-10-CM

## 2014-08-25 DIAGNOSIS — E785 Hyperlipidemia, unspecified: Secondary | ICD-10-CM

## 2014-08-25 LAB — LIPID PANEL
CHOLESTEROL: 101 mg/dL (ref 0–200)
HDL: 37.7 mg/dL — AB (ref >39.00)
LDL Cholesterol: 43 mg/dL (ref 0–99)
NonHDL: 63.3
Total CHOL/HDL Ratio: 3
Triglycerides: 104 mg/dL (ref 0.0–149.0)
VLDL: 20.8 mg/dL (ref 0.0–40.0)

## 2014-08-25 LAB — HEPATIC FUNCTION PANEL
ALK PHOS: 68 U/L (ref 39–117)
ALT: 18 U/L (ref 0–53)
AST: 19 U/L (ref 0–37)
Albumin: 4.1 g/dL (ref 3.5–5.2)
BILIRUBIN DIRECT: 0.1 mg/dL (ref 0.0–0.3)
Total Bilirubin: 0.4 mg/dL (ref 0.2–1.2)
Total Protein: 7.1 g/dL (ref 6.0–8.3)

## 2014-08-25 LAB — MICROALBUMIN / CREATININE URINE RATIO
CREATININE, U: 38.2 mg/dL
MICROALB UR: 1.4 mg/dL (ref 0.0–1.9)
MICROALB/CREAT RATIO: 3.7 mg/g (ref 0.0–30.0)

## 2014-08-25 LAB — HEMOGLOBIN A1C: HEMOGLOBIN A1C: 7.1 % — AB (ref 4.6–6.5)

## 2014-08-25 LAB — TSH: TSH: 1.7 u[IU]/mL (ref 0.35–4.50)

## 2014-08-27 ENCOUNTER — Other Ambulatory Visit: Payer: Self-pay | Admitting: Internal Medicine

## 2014-08-27 MED ORDER — METFORMIN HCL 500 MG PO TABS: 500.0000 mg | ORAL_TABLET | Freq: Two times a day (BID) | ORAL | Status: DC

## 2014-08-29 ENCOUNTER — Other Ambulatory Visit: Payer: Self-pay | Admitting: Internal Medicine

## 2014-08-29 NOTE — Progress Notes (Signed)
Metformin has already been sent to the pharmacy

## 2014-09-27 ENCOUNTER — Other Ambulatory Visit: Payer: Self-pay | Admitting: Internal Medicine

## 2014-09-28 ENCOUNTER — Other Ambulatory Visit: Payer: Self-pay | Admitting: Pulmonary Disease

## 2014-09-28 DIAGNOSIS — R911 Solitary pulmonary nodule: Secondary | ICD-10-CM

## 2014-10-03 ENCOUNTER — Telehealth: Payer: Self-pay | Admitting: Internal Medicine

## 2014-10-03 DIAGNOSIS — I10 Essential (primary) hypertension: Secondary | ICD-10-CM

## 2014-10-03 MED ORDER — DOXAZOSIN MESYLATE 2 MG PO TABS: 2.0000 mg | ORAL_TABLET | Freq: Two times a day (BID) | ORAL | Status: DC

## 2014-10-03 NOTE — Telephone Encounter (Signed)
Patient stated that he need a refill of medication Cardura 2 mg, and he is totally out

## 2014-10-03 NOTE — Telephone Encounter (Signed)
Done

## 2014-10-05 ENCOUNTER — Other Ambulatory Visit: Payer: Self-pay

## 2014-10-05 DIAGNOSIS — I10 Essential (primary) hypertension: Secondary | ICD-10-CM

## 2014-10-05 MED ORDER — DOXAZOSIN MESYLATE 2 MG PO TABS: 2.0000 mg | ORAL_TABLET | Freq: Two times a day (BID) | ORAL | Status: DC

## 2014-10-11 ENCOUNTER — Ambulatory Visit (INDEPENDENT_AMBULATORY_CARE_PROVIDER_SITE_OTHER)
Admission: RE | Admit: 2014-10-11 | Discharge: 2014-10-11 | Disposition: A | Discharge status: Home / Self Care | Payer: 59 | Source: Ambulatory Visit | Attending: Pulmonary Disease | Admitting: Pulmonary Disease

## 2014-10-11 DIAGNOSIS — R911 Solitary pulmonary nodule: Secondary | ICD-10-CM

## 2014-11-09 ENCOUNTER — Ambulatory Visit: Payer: 59 | Admitting: Pharmacist

## 2014-11-09 ENCOUNTER — Ambulatory Visit (INDEPENDENT_AMBULATORY_CARE_PROVIDER_SITE_OTHER): Payer: Self-pay | Admitting: Family Medicine

## 2014-11-09 VITALS — BP 160/52 | Ht 72.0 in | Wt 295.0 lb

## 2014-11-09 DIAGNOSIS — E119 Type 2 diabetes mellitus without complications: Secondary | ICD-10-CM

## 2014-11-09 NOTE — Progress Notes (Signed)
Diet - not quite as much eat to live as previously, but working back to this.     Subjective:  Patient presents today for 3 month diabetes follow-up as part of the employer-sponsored [employer] [facility] [employer] program.  Current diabetes regimen includes metformin.  Patient also continues on daily ASA, ARB, and statin.  Patient had shoulder replacement surgery Jan 2016 and is recovering well.  He has resumed metformin since the surgery due to elevated glucose.  No other changes to maintenance medications.    Assessment/Plan:  Patient is a 64 yo male with DM type 2. Most recent A1C was 7.1% which is slightly above goal of less than 7%. Weight has increased since last visit.  Patient is compliant with medications.  He continues using OneTouch glucometer.  Did not bring meter today but is testing once daily, fasting.  Per patient report, glucose is 140-150 most mornings, highest of 162 and no hypoglycemia.  Up to date on eye and dental exams.  No signs of neuropathy or infection at this time.    Lifestyle:  Physical Activity-  Continues doing PT after shoulder surgery.  He is unable to lift weights as he once was due to shoulder recovery, but has now worked up to 25 lbs with surgical arm.  Orthopedist continues limiting activity but pt is hoping to be released at 16 weeks post surgery.  He continues walking regularly in the evenings after work.    Nutrition-  Diet has deteriorated.  He is no longer adhering to Eat to Live but is getting back on track with this.  As a result he has had some weight gain.     Goals for Next Visit: 1)  Continue making improvements in exercise and increasing exercise as tolerated and as approved by orthopedist 2)  Resume Eat to Live diet 3)  Continue testing daily 4)  Follow-up in 3 months on Wednesday July 27th @ 1:30 pm  Tilman Neat, PharmD [facility] [place]

## 2014-11-09 NOTE — Patient Instructions (Signed)
1)  Continue making improvements in exercise and increasing exercise as tolerated and as approved by orthopedist 2)  Resume Eat to Live diet 3)  Continue testing daily 4)  Follow-up in 3 months on Wednesday July 27th @ 1:30 pm  Great to see you today!

## 2014-11-15 NOTE — Progress Notes (Signed)
Patient ID: Rodney Hayes, male   DOB: 06/25/1951, 64 y.o.   MRN: [id number] ATTENDING PHYSICIAN NOTE: I have reviewed the chart and agree with the plan as detailed above. Dorcas Mcmurray MD Pager [phone]

## 2014-11-23 ENCOUNTER — Encounter: Payer: Self-pay | Admitting: Internal Medicine

## 2015-02-08 ENCOUNTER — Ambulatory Visit: Payer: 59 | Admitting: Pharmacist

## 2015-02-28 ENCOUNTER — Other Ambulatory Visit: Payer: Self-pay | Admitting: Internal Medicine

## 2015-02-28 ENCOUNTER — Other Ambulatory Visit: Payer: Self-pay | Admitting: Emergency Medicine

## 2015-02-28 ENCOUNTER — Ambulatory Visit (INDEPENDENT_AMBULATORY_CARE_PROVIDER_SITE_OTHER): Payer: Self-pay | Admitting: Family Medicine

## 2015-02-28 VITALS — BP 136/74 | Wt 302.0 lb

## 2015-02-28 DIAGNOSIS — E119 Type 2 diabetes mellitus without complications: Secondary | ICD-10-CM

## 2015-02-28 MED ORDER — LOSARTAN POTASSIUM 100 MG PO TABS: 100.0000 mg | ORAL_TABLET | Freq: Every day | ORAL | Status: DC

## 2015-02-28 NOTE — Progress Notes (Signed)
Subjective:  Patient presents today for 3 month diabetes follow-up as part of the employer-sponsored Link to Wellness program.  Current diabetes regimen includes metformin.  Patient also continues on daily ASA, ARB, and statin.  Patient had shoulder replacement surgery Jan 2016 and has recovered well.  He has resumed metformin since the surgery due to elevated glucose.  No other changes to maintenance medications.  Patient will schedule a follow-up with Dr. Linna Darner soon to assess increasing glucose and changes needed.   Assessment/Plan:  Patient is a 64 yo male with DM type 2. Most recent A1C was 8.7 (prev 7.1%) done via POC testing in pharmacy  which remains above goal of less than 7%. Weight has increased by 7 lb since last visit.  Patient is compliant with medications.  He is now [name] for cost savings.  Did not bring meter today but is testing once daily, fasting.  Per patient report, glucose is 150-160 most mornings, highest of 162 and no hypoglycemia.  Up to date on eye and dental exams.  No signs of neuropathy or infection at this time.  Patient does report mild intermittent itching of feet primarily at night, which he believes may be associated with increased glucose.   Lifestyle:  Physical Activity-  Patient has been cleared for full exercise and activity after shoulder surgery early this year.  He continues to lift weights, do cardio, pilates, and stretching 4-5 days per week, sometimes twice daily.  He is now back up to 1 hour of cardio after shoulder surgery.  Nutrition-  Diet has deteriorated.  As a result he continues to gain weight.  He is not happy with this and is aware of the need for improvement. He has been eating more junk foods, more burgers, hot dogs, and "teenage food".  Portions are uncontrolled at this time.  Patient plans to get back on track soon and hopes to tackle portion control first.    Goals for Next Visit: 1)  Great job making improvements in  exercise  2)  Attempt to make healthier choices and better control portion sizes 3)  Continue testing daily 4)  Follow-up in 3 months on Wednesday Nov 16th @ 1:30 pm  Great to see you today!   Tilman Neat, PharmD Link to [facility]

## 2015-02-28 NOTE — Patient Instructions (Signed)
1)  Great job making improvements in exercise  2)  Attempt to make healthier choices and better control portion sizes 3)  Continue testing daily 4)  Follow-up in 3 months on Wednesday Nov 16th @ 1:30 pm  Great to see you today!

## 2015-03-09 ENCOUNTER — Ambulatory Visit: Payer: Self-pay | Admitting: Internal Medicine

## 2015-03-13 ENCOUNTER — Encounter: Payer: Self-pay | Admitting: Internal Medicine

## 2015-03-13 ENCOUNTER — Ambulatory Visit (INDEPENDENT_AMBULATORY_CARE_PROVIDER_SITE_OTHER): Payer: 59 | Admitting: Internal Medicine

## 2015-03-13 VITALS — BP 140/74 | HR 76 | Temp 98.9°F | Resp 18 | Wt 304.0 lb

## 2015-03-13 DIAGNOSIS — E1165 Type 2 diabetes mellitus with hyperglycemia: Secondary | ICD-10-CM

## 2015-03-13 DIAGNOSIS — I10 Essential (primary) hypertension: Secondary | ICD-10-CM

## 2015-03-13 DIAGNOSIS — IMO0002 Reserved for concepts with insufficient information to code with codable children: Secondary | ICD-10-CM

## 2015-03-13 DIAGNOSIS — E785 Hyperlipidemia, unspecified: Secondary | ICD-10-CM | POA: Diagnosis not present

## 2015-03-13 MED ORDER — CARVEDILOL 3.125 MG PO TABS: 3.1250 mg | ORAL_TABLET | Freq: Two times a day (BID) | ORAL | Status: DC

## 2015-03-13 MED ORDER — SAXAGLIPTIN HCL 5 MG PO TABS: 5.0000 mg | ORAL_TABLET | Freq: Every day | ORAL | Status: DC

## 2015-03-13 NOTE — Assessment & Plan Note (Signed)
Add Onglyza 5 mg qd Low carb diet

## 2015-03-13 NOTE — Patient Instructions (Addendum)
Please follow a Mediaterranean type diet  (many good cook books readily available) or review Dr Nunzio Cory book Eat, [place] for best  dietary cholesterol information & options.  Continue cardiovascular exercise 30-45 minutes 3-4 times per week.  The following nutritional changes may help prevent Diabetes progression & complications.  White carbohydrates (potatoes, rice, bread, and pasta) cause a high spike of the sugar level which stays elevated for a significant period of time (called sugar"load").  For example a  baked potato has a cup of sugar and a  french fry  2 teaspoons of sugar.  More complex carbs such as yams, wild  rice, whole grained bread &  wheat pasta have been much lower spike and persistent load of sugar than the white carbs. The pancreas excretes excess insulin in response to the high spike & load of sugar . Over time the pancreas can actually run out of insulin necessitating insulin shots. A1c in 3-4 months. Minimal Blood Pressure Goal= AVERAGE < 140/90;  Ideal is an AVERAGE < 135/85. This AVERAGE should be calculated from @ least 5-7 BP readings taken @ different times of day on different days of week. You should not respond to isolated BP readings , but rather the AVERAGE for that week .Please bring your  blood pressure cuff to office visits to verify that it is reliable.It  can also be checked against the blood pressure device at the pharmacy. Finger or wrist cuffs are not dependable; an arm cuff is.Fill the  prescription for the BP medication if BP NOT @ goal based on  7 to 14 day average.

## 2015-03-13 NOTE — Progress Notes (Signed)
   Subjective:    Patient ID: Rodney Hayes, male    DOB: [birth year], 64 y.o.   MRN: [id number]  HPI He is here to assess status of active health conditions:  His exercise program had been extremely good until his left shoulder surgery in January of this year. He has started exercise with Pilates for 60 minutes and cardio 2-3 times per week for 30-45 minutes. He denies associated cardiopulmonary symptoms except for exertional dyspnea when his pulse gets to 140-150.  He is following no diet.  Fasting blood sugars range from 130-250+. Ophthalmologic exam is up-to-date with no evidence of retinopathy. He has not seen a podiatrist. He denies any hypoglycemia. His A1c was 8.7% a few weeks ago. He has no neuropathic symptoms but has itching in his feet at night.  Blood pressure ranges 140-150/80.  His lipids were excellent in February of this year with an LDL of 43; but he was intolerant to statins which caused severe joint pain.  Review of Systems  Chest pain, palpitations: No       Edema: Not significant Claudication: No Lightheadedness,Syncope: No Polyuria/phagia/dipsia:   No   Blurred vision /diplopia/lossof vision: No Limb numbness/tingling/burning: No Non healing skin lesions: No Abd pain, bowel changes: No  Myalgias: No Memory loss: No    Objective:   Physical Exam   Pertinent or positive findings include: Crepitus left knee greater than right. 1+ edema at the right sock line and trace-one half on the left. Man fungal nail changes of left great toenail.  General appearance :adequately nourished; in no distress.  Eyes: No conjunctival inflammation or scleral icterus is present.  Oral exam:  Lips and gums are healthy appearing.There is no oropharyngeal erythema or exudate noted. Dental hygiene is good.  Heart:  Normal rate and regular rhythm. S1 and S2 normal without gallop, murmur, click, rub or other extra sounds    Lungs:Chest clear to auscultation; no wheezes,  rhonchi,rales ,or rubs present.No increased work of breathing.   Abdomen: Protuberant;bowel sounds normal, soft and non-tender without masses, organomegaly or hernias noted.  No guarding or rebound.  Vascular : all pulses equal ; no bruits present.  Skin:Warm & dry.  Intact without suspicious lesions or rashes ; no tenting or jaundice   Lymphatic: No lymphadenopathy is noted about the head, neck, axilla.   Neuro: Strength, tone & DTRs normal.    Assessment & Plan:  See Current Assessment & Plan in Problem List under specific Diagnosis

## 2015-03-13 NOTE — Assessment & Plan Note (Signed)
Blood pressure goals reviewed. Add Carvedilol if not @ goal BMET in 3 months

## 2015-03-13 NOTE — Progress Notes (Signed)
Pre visit review using our clinic review tool, if applicable. No additional management support is needed unless otherwise documented below in the visit note. 

## 2015-03-14 ENCOUNTER — Other Ambulatory Visit: Payer: Self-pay | Admitting: Emergency Medicine

## 2015-03-14 DIAGNOSIS — E1165 Type 2 diabetes mellitus with hyperglycemia: Secondary | ICD-10-CM

## 2015-03-14 DIAGNOSIS — IMO0002 Reserved for concepts with insufficient information to code with codable children: Secondary | ICD-10-CM

## 2015-03-14 MED ORDER — METFORMIN HCL 500 MG PO TABS: ORAL_TABLET | ORAL | Status: DC

## 2015-03-14 NOTE — Assessment & Plan Note (Signed)
Mediaterranean type diet  (many good cook books readily available) or review Dr [employer] book Eat, [place] for best  dietary cholesterol information & options.   Continue excellent cardiovascular exercise program 30-45 minutes 3-4 times per week.

## 2015-03-22 NOTE — Progress Notes (Signed)
ATTENDING PHYSICIAN NOTE: I have reviewed the chart and agree with the plan as detailed above. Sara Neal MD Pager [phone]  

## 2015-03-28 ENCOUNTER — Other Ambulatory Visit: Payer: Self-pay | Admitting: Internal Medicine

## 2015-03-28 ENCOUNTER — Other Ambulatory Visit: Payer: Self-pay | Admitting: Emergency Medicine

## 2015-03-28 MED ORDER — HYDROCHLOROTHIAZIDE 25 MG PO TABS: 25.0000 mg | ORAL_TABLET | Freq: Every day | ORAL | Status: DC

## 2015-05-30 LAB — HM DIABETES EYE EXAM

## 2015-05-31 ENCOUNTER — Encounter: Payer: Self-pay | Admitting: Pharmacist

## 2015-05-31 ENCOUNTER — Ambulatory Visit (INDEPENDENT_AMBULATORY_CARE_PROVIDER_SITE_OTHER): Payer: Self-pay | Admitting: Family Medicine

## 2015-05-31 VITALS — BP 156/78 | Wt 301.0 lb

## 2015-05-31 DIAGNOSIS — E119 Type 2 diabetes mellitus without complications: Secondary | ICD-10-CM

## 2015-05-31 NOTE — Patient Instructions (Signed)
1)  Resume exercise daily  2)  Attempt to make healthier choices and better control portion sizes, especially during holidays. 3)  Continue testing daily with goal of glucose consistenly less than 200 4)  Follow-up in 3 months on Wednesday Nov 16th @ 1:30 pm  Great to see you today!

## 2015-05-31 NOTE — Progress Notes (Signed)
Subjective:  Patient presents today for 3 month diabetes follow-up as part of the employer-sponsored Link to Wellness program.  Current diabetes regimen includes metformin and onglyza.  Patient also continues on daily ASA and ARB, but is not on a statin.  Instead has chosen to manage hyperlipidemia with diet and exercise.  Patient had shoulder replacement surgery Jan 2016 and has recovered well.  Most recent MD follow-up, August 2016.  At this time carvedilol was added for further blood pressure control. Patient has not started this medication yet, but has prescription on hand for when he decides to do so.    Assessment/Plan:  Patient is a 64 yo male with DM type 2. Most recent A1C was 8.7 (prev 7.1%) done via POC testing in pharmacy  which remains above goal of less than 7%. Weight has decreased by 3 lb since last visit.  Patient is compliant with medications.  He is now [name] for cost savings.  Did not bring meter today but is testing once daily, fasting.  Per patient report, glucose is running consistently higher and ranges 160-220.  No hypoglycemia.  Up to date on eye and dental exams.  No signs of neuropathy or infection at this time.  Patient does report mild intermittent itching of feet primarily at night, which he believes may be associated with increased glucose.    Lifestyle:  Physical Activity-  In the past, patient has always done an excellent job maintaining exercise; however, he recently built a new house and is in the process of moving and therefore, has not exercised as he normally would.  He plans to get back on track soon.   Nutrition-  Diet has continued to deteriorate.  He is not happy with this and is aware of the need for improvement. He has been eating more junk foods, more burgers, hot dogs, and "teenage food".  Portions are uncontrolled at this time.  Patient plans to get back on track soon and hopes to tackle portion control first.   Ex: Breakfast -  muffin, bacon biscuits (2), fruit Lunch - sandwiches instead of salads Supper - takeout often now due to busy schedule, chinese, burgers, pizza, etc.   Goals for Next Visit: 1)  Resume exercise daily  2)  Attempt to make healthier choices and better control portion sizes, especially during holidays. 3)  Continue testing daily with goal of glucose consistenly less than 200 4)  Follow-up in 3 months on Wednesday Nov 16th @ 1:30 pm  Great to see you today!   Tilman Neat, PharmD Link to [facility]

## 2015-06-05 ENCOUNTER — Other Ambulatory Visit: Payer: Self-pay | Admitting: Emergency Medicine

## 2015-06-05 MED ORDER — SAXAGLIPTIN HCL 5 MG PO TABS: 5.0000 mg | ORAL_TABLET | Freq: Every day | ORAL | Status: DC

## 2015-06-12 NOTE — Progress Notes (Signed)
I have reviewed this pharmacist's note and agree  

## 2015-06-26 ENCOUNTER — Other Ambulatory Visit: Payer: Self-pay | Admitting: Internal Medicine

## 2015-07-12 ENCOUNTER — Encounter: Payer: Self-pay | Admitting: Internal Medicine

## 2015-07-19 DIAGNOSIS — Z96652 Presence of left artificial knee joint: Secondary | ICD-10-CM | POA: Diagnosis not present

## 2015-07-19 DIAGNOSIS — M791 Myalgia: Secondary | ICD-10-CM | POA: Diagnosis not present

## 2015-07-19 DIAGNOSIS — M9901 Segmental and somatic dysfunction of cervical region: Secondary | ICD-10-CM | POA: Diagnosis not present

## 2015-07-19 DIAGNOSIS — M9902 Segmental and somatic dysfunction of thoracic region: Secondary | ICD-10-CM | POA: Diagnosis not present

## 2015-07-19 DIAGNOSIS — M50223 Other cervical disc displacement at C6-C7 level: Secondary | ICD-10-CM | POA: Diagnosis not present

## 2015-07-19 DIAGNOSIS — M25612 Stiffness of left shoulder, not elsewhere classified: Secondary | ICD-10-CM | POA: Diagnosis not present

## 2015-07-19 DIAGNOSIS — M19071 Primary osteoarthritis, right ankle and foot: Secondary | ICD-10-CM | POA: Diagnosis not present

## 2015-07-19 DIAGNOSIS — Z96612 Presence of left artificial shoulder joint: Secondary | ICD-10-CM | POA: Diagnosis not present

## 2015-07-19 DIAGNOSIS — M9906 Segmental and somatic dysfunction of lower extremity: Secondary | ICD-10-CM | POA: Diagnosis not present

## 2015-08-04 ENCOUNTER — Encounter: Payer: Self-pay | Admitting: Pulmonary Disease

## 2015-08-04 ENCOUNTER — Ambulatory Visit (INDEPENDENT_AMBULATORY_CARE_PROVIDER_SITE_OTHER): Payer: 59 | Admitting: Pulmonary Disease

## 2015-08-04 VITALS — BP 122/76 | HR 69 | Temp 99.0°F | Ht 72.0 in | Wt 303.0 lb

## 2015-08-04 DIAGNOSIS — Z96612 Presence of left artificial shoulder joint: Secondary | ICD-10-CM

## 2015-08-04 DIAGNOSIS — M8949 Other hypertrophic osteoarthropathy, multiple sites: Secondary | ICD-10-CM

## 2015-08-04 DIAGNOSIS — I10 Essential (primary) hypertension: Secondary | ICD-10-CM

## 2015-08-04 DIAGNOSIS — R938 Abnormal findings on diagnostic imaging of other specified body structures: Secondary | ICD-10-CM

## 2015-08-04 DIAGNOSIS — E119 Type 2 diabetes mellitus without complications: Secondary | ICD-10-CM

## 2015-08-04 DIAGNOSIS — Z86010 Personal history of colonic polyps: Secondary | ICD-10-CM

## 2015-08-04 DIAGNOSIS — I872 Venous insufficiency (chronic) (peripheral): Secondary | ICD-10-CM

## 2015-08-04 DIAGNOSIS — I2510 Atherosclerotic heart disease of native coronary artery without angina pectoris: Secondary | ICD-10-CM | POA: Insufficient documentation

## 2015-08-04 DIAGNOSIS — E669 Obesity, unspecified: Secondary | ICD-10-CM

## 2015-08-04 DIAGNOSIS — M159 Polyosteoarthritis, unspecified: Secondary | ICD-10-CM | POA: Insufficient documentation

## 2015-08-04 DIAGNOSIS — R911 Solitary pulmonary nodule: Secondary | ICD-10-CM | POA: Diagnosis not present

## 2015-08-04 DIAGNOSIS — G4733 Obstructive sleep apnea (adult) (pediatric): Secondary | ICD-10-CM

## 2015-08-04 DIAGNOSIS — E1169 Type 2 diabetes mellitus with other specified complication: Secondary | ICD-10-CM

## 2015-08-04 DIAGNOSIS — E785 Hyperlipidemia, unspecified: Secondary | ICD-10-CM

## 2015-08-04 DIAGNOSIS — Z96659 Presence of unspecified artificial knee joint: Secondary | ICD-10-CM | POA: Insufficient documentation

## 2015-08-04 DIAGNOSIS — Z96652 Presence of left artificial knee joint: Secondary | ICD-10-CM

## 2015-08-04 DIAGNOSIS — R011 Cardiac murmur, unspecified: Secondary | ICD-10-CM | POA: Diagnosis not present

## 2015-08-04 DIAGNOSIS — R931 Abnormal findings on diagnostic imaging of heart and coronary circulation: Secondary | ICD-10-CM

## 2015-08-04 DIAGNOSIS — Z8619 Personal history of other infectious and parasitic diseases: Secondary | ICD-10-CM

## 2015-08-04 DIAGNOSIS — M150 Primary generalized (osteo)arthritis: Secondary | ICD-10-CM

## 2015-08-04 NOTE — Progress Notes (Addendum)
Subjective:     Patient ID: Rodney Hayes, male   DOB: 1951/03/12, 65 y.o.   MRN: [id number]  HPI ~  August 04, 2015:  Initial medical visit w/ SN>      Rodney Hayes is a 65 y/o NP in the [facility]/ [facility] division here to establish general medical care...  I have reviewed the extensive old records in [place] and establish the following problem list and DATA tabulation>>   DATA:   ~  2DEcho 09/03/11 showed mild LVH, mild focal asal hypertrophy of septum, norm LVF w/ EF=60-65%, norm wall motion, Gr1DD, mild calcif of AoV leaflets- no AS, trivAI, MV- wnl, mild LAdil, norm PAsys...  ~  CXR 01/06/13 showed heart at upper lim of norm, min peribronch thickening otherw clear lungs, NAD, Tspine osteophytes...  ~  EKG 02/07/14 showed NSR w/ occ PVCs, poor R prog V1-3, 1st degree AVB...  ~  Cardiac CT 04/01/14 showed very high calcium score=1847; norm Ao root, AoV trileaflet, Lmain=ok, LAD plaque worst in mid-vessel ~70%, 1st diag ~50%, midRCA is heavily calcif but can't eval stenosis due to motion; CATH was rec- but pt declined... Additional findings: 55m RLL nodule medially,  ~  Exercise Treadmill Test 04/2014> 628m, 7Mets, HR= 73-139 (89%max target), +HBP response, no CP, no EKG changes... ~  CT Chest 10/11/14 showed coronary calcif, sm nodes, 35m80mLL nodule (it measured 6mm61m2015) & airway plugging in RLL, min linear scarring RUL, left shoulder arthritis & thoracic spondylosis,   PROBLEM LIST:     OSA on CPAP> cannot find prev sleep study in EPIC [facility] he is on CPAP 12, tolerates well & keeps up w/ mask/ tubing changes Q6mo,535moellent compliance/ rests well/ no daytime sleepiness issues...     Pulmonary nodule on CT Chest, ex-smoker, Hx +PPD, remote hx asthma/ alergies as child> Ex-smoker approx 40pk-yrs & quit 1994; has not required resp meds; CT Chest 9/15 & 3/16 as above (35mmRL68module) f/u due 3/17; there is a report of a pos PPD in the past & not treated w/ INH...    HBP,  Heart murmur at base> on Amlod10, Cardura2Bid, Losar100, HCT25; BP= 122/76 today, usually sl higher at home; 2DEcho w/ mild LVH & ETT w/ hypertensive response; he declined to take BBlocker rec by DrHopper 8/16; denies CP, palpit, SOB, etc...     Coronary calcium score >400... on ASA81; he has +FamHx CAD w/ CABG in father, coronary calcif seen on CTChest & a very high coronary calcium score on CardiacCT (1847)(1517was rec to have CATH but declined- he does not have CP & is very active w/ exercise program; neg ETT 10/15;  I too feel that he should have diagnostic cath w/ his mult coronary risk factors & we discussed this...    Venous insuffic> trophic changes of VI on exam, hx intermittent edema on HCT25 daily & wears support hose...    Hx hyperlipidemia> remote labs showed TG up to 372 in past, TChol & LDL have always been good;  He was INTOL to Lip20 [facility]  FLP 2/16 showed TChol 101, TG 104, HDL 38, LDL 43 on diet alone; FLP 1/17 showed TChol 147, TG 192, HDL 39, LDL 70;  He knows that low fat/ low carb diet 7 wt loss will bring TG back to normal...    DM2> on Metform500-2Bid, Onglyza5; Urine microalb & renal function normal; no retinopathy; recent accuchecks= 170-280 at home; POC A1c values reports >  8.0; Labs 07/2015 showed FBS=183, A1c=9.6; we discussed additional meds but he admits to poor diet over the last 764mo& wants 3464morial of vigorous diet/ continued exercise/ and weight reduction w/ repeat labs...    Obesity> despite his best efforts weight has remained ~300# & we reviewed diet/ exercise/ wt reduction strategies; strive for 20# increments...     Hx colon polyps, divertics> last colonoscopy 06/2009 by DrGessner showed 64m29molyp in mid-transverse colon (adenomatous), mild divertics; he is overdue for f/u colon & will contact GI at his convenience...     Hx HepB infection> from needle stick exposure 1978 (renal Tx pt), treated conservatively & resolved, LFTs have all been wnl since  then...     DJD, s/p L-TKR, s/p L-shoulder replacement> followed by DrAlusio & DrNorris...  EXAM shows Afeb, VSS, O2sat=94% on RA;  HEENT- neg, mallampati3;  Chest- clear w/o w/r/r;  Heart- RR gr1-2/6 SEM at base w/o r/g;  Abd- obese,soft, nontender;  Ext- VI changes w/o c/c/e;  Neuro- intact...  LABS 07/2015>  FLP- ok on diet alone w/ good Chol but TG up to 192 (needs better diet);  Chems ok x BS=183, A1c=9.6 (needs additional meds);  CBC, TSH= wnl;  PSA=3.82 representing incr PSA velocity & rec recheck in 29mo55moDEcho 07/2015>  pending IMP/PLAN>>  Rodney Landryhere to establish with us, [place] PCP= DrHopper, f/u labs and 2DEcho are pending;  Concerns include weight, BP, coronary art dis and mult coronary risk factors;  He has an incidental 7mm 73m nodule seen on prev CT & f/u due 09/2015;  We discussed TG, BS/A1c, & PSA as above- he is committed to low fat/ low carb diet & wt reduction w/ recheck FLP, BS/A1c in 258mo; 70moill need a f/u PSA in 29mo;  21moow up screening colon is overdue as well & he will contact DrGessner at his earliest convenience...  ADDENDUM>>  CT Chest 11/20/15 showed stable 6mm nod464m in RLL no change over 37mo int329mo, no new pulm nodules, no adenopathy, +coronary calcif as before, no change in diffuse distal esoph wall thickening...  ADDENDUM>>  Pt did not return for planned f/u labs in April2017;  [facility] that [facility] wanted to change his Metform500-2Bid +Onglyza5/d regimen back to [place] one tab Qam because he thought it was more effective for him... I sent a note to Pt requesting f/u FASTING blood work before making a decision about decreasing his med dose vs poss referral to Endocrine/ DM specialist...  >>Rodney Hayes-- I received a note from the [facility] [place] to return to Kombiglyz[place]1 tab daily (this is obviously 1/2 of your current dose(;; I've attached a copy of our  07/2015 CPX- & note that Wt was 303#, BS=183, A1c=9.6 and the plan was to recheck the fasting blood work in 258mo on th51mods + vigorous diet/ exercise effort-- we are clearly overdue. & in addition we wanted to recheck the PSA at 29mo since 77moas 3.82 in Jan2017...     The [facility] people have been seeing you every 258mo & inclu64mothis data:         09/2015>  Wt=292#         01/2016>  Wt=301#         05/2016>  Wt=306#  How about we have you come by the office, or [facility] one morning at your convenience for f/u FASTING blood  work before we make any changes...    Past Medical History  Diagnosis Date  . Hyperlipidemia   . Hypertension   . Obesity   . Nonspecific abnormal electrocardiogram (ECG) (EKG)   . Asthma   . Arthritis   . DJD (degenerative joint disease)   . BPH (benign prostatic hyperplasia)   . Diabetes mellitus     average fasting 140s  . Shortness of breath dyspnea     exertion  . Heart murmur     [name]  . Sleep apnea     C-PAP    Past Surgical History  Procedure Laterality Date  . Tonsillectomy and adenoidectomy    . Umbilical hernia repair    . Left knee arthroscopy    . Biceps tendon reinsertion    . Colonoscopy with polypectomy      adenomatous; [name]  . Total knee arthroplasty Left 01/11/2013    Procedure: LEFT TOTAL KNEE ARTHROPLASTY;  Surgeon: [name], MD;  Location: [facility];  Service: Orthopedics;  Laterality: Left;  . Joint replacement Left     knee  . Total shoulder arthroplasty Left 08/12/2014    Procedure: LEFT TOTAL SHOULDER ARTHROPLASTY;  Surgeon: [name], MD;  Location: [facility];  Service: Orthopedics;  Laterality: Left;    Outpatient Encounter Prescriptions as of 08/04/2015  Medication Sig  . amLODipine (NORVASC) 10 MG tablet Take 1 tablet (10 mg total) by mouth every morning. --need to establish care with new PCP, call our office for appt.  Marland Kitchen aspirin EC 81 MG tablet Take 1 tablet (81 mg total) by mouth daily.  Marland Kitchen  doxazosin (CARDURA) 2 MG tablet Take 1 tablet (2 mg total) by mouth 2 (two) times daily.  . Glucosamine HCl (GLUCOSAMINE PO) Take 2 tablets by mouth daily.  . Glucose Blood (TRUE METRIX BLOOD GLUCOSE TEST VI) Use to test glucose once daily  . hydrochlorothiazide (HYDRODIURIL) 25 MG tablet Take 1 tablet (25 mg total) by mouth daily.  Marland Kitchen losartan (COZAAR) 100 MG tablet Take 1 tablet (100 mg total) by mouth daily.  . metFORMIN (GLUCOPHAGE) 500 MG tablet 2 pills bid after 2 largest meals  . Multiple Vitamins-Minerals (MULTIVITAMIN PO) Take 1 tablet by mouth daily.  . saxagliptin HCl (ONGLYZA) 5 MG TABS tablet Take 1 tablet (5 mg total) by mouth daily.  . carvedilol (COREG) 3.125 MG tablet Take 1 tablet (3.125 mg total) by mouth 2 (two) times daily with a meal. (Patient not taking: Reported on 05/31/2015)    Allergies  Allergen Reactions  . Cymbalta [Duloxetine Hcl]     As per e-mail 12/13/11: Insomnia, lethargy, inability to ejaculate  . Tetracycline     REACTION: PHOTOSENSITIVITY  He is also sensitive to STATIN meds w/ severe arthralgias...    Immunization History  Administered Date(s) Administered  . Influenza Whole 05/15/2012, 04/14/2013  . Influenza-Unspecified 04/15/2015  . Tdap 07/15/2008    Family History  Problem Relation Age of Onset  . Lung cancer Mother     smoker  . Diabetes Father   . Coronary artery disease Father     CBAG in late 59s  . Stroke Neg Hx   Father had HBP, DM, CAD-s/pCABG, prostate ca... Mother had DM, nonsmall cell lung cancer   Social History   Social History  . Marital Status: Married    Spouse Name: N/A  . Number of Children: N/A  . Years of Education: N/A   Occupational History  . Not on file.  Social History Main Topics  . Smoking status: Former Smoker -- 2.00 packs/day for 30 years    Types: Cigarettes    Start date: 07/15/1962    Quit date: 08/15/1992  . Smokeless tobacco: Never Used     Comment: smoked 14 -42 up to 2 ppd  .  Alcohol Use: 2.4 oz/week    4 Glasses of wine per week     Comment: Red Wine  . Drug Use: No  . Sexual Activity: Not on file   Other Topics Concern  . Not on file   Social History Narrative    Review of Systems             All symptoms NEG except where BOLDED >>  Constitutional:  F/C/S, fatigue, anorexia, unexpected weight change. HEENT:  HA, visual changes, hearing loss, earache, nasal symptoms, sore throat, mouth sores, hoarseness. Resp:  cough, sputum, hemoptysis; SOB, tightness, wheezing. Cardio:  CP, palpit, DOE, orthopnea, edema (intermittently) GI:  N/V/D/C, blood in stool; reflux, abd pain, distention, gas. GU:  dysuria, freq, urgency, hematuria, flank pain, voiding difficulty. MS:  joint pain, swelling, tenderness, decr ROM; neck pain, back pain, etc. Neuro:  HA, tremors, seizures, dizziness, syncope, weakness, numbness, gait abn. Skin:  suspicious lesions or skin rash. Heme:  adenopathy, bruising, bleeding. Psyche:  confusion, agitation, sleep disturbance (OSA), hallucinations, anxiety, depression suicidal.   Objective:   Physical Exam       Vital Signs:  Reviewed...   General:  WD, obese, 65 y/o WM in NAD; alert & oriented; pleasant & cooperative... HEENT:  [place]/AT; Conjunctiva- pink, Sclera- nonicteric, EOM-wnl, PERRLA, EACs-clear, TMs-wnl; NOSE-clear; THROAT-clear & wnl.  Neck:  Supple w/ fair ROM; no JVD; normal carotid impulses w/o bruits; no thyromegaly or nodules palpated; no lymphadenopathy.  Chest:  Clear to P & A; without wheezes, rales, or rhonchi heard. Heart:  Regular Rhythm; norm S1 & S2, Gr1-2/6 SEM at base, no rubs or gallops detected... Abdomen:  Obese, soft & nontender- no guarding or rebound; normal bowel sounds; no organomegaly or masses palpated. Ext:  +arthritic changes w/ L-TKR & L-shoulder replacement; +venous insuffic changes, no VV or edema;  Pulses intact w/o bruits. Neuro:  CNs II-XII intact; motor testing normal; sensory testing normal; gait  normal & balance OK. Derm:  No lesions noted; no rash etc. Lymph:  No cervical, supraclavicular, axillary, or inguinal adenopathy palpated.   Assessment:         OSA on CPAP> on CPAP 12(?) but prev PSG not avail & no paperwork/ download data avail in Epic; he is doing well w/ current settings & keeps up w/ mask & tubing changes, etc...    Pulmonary nodule on CT Chest, ex-smoker, Hx +PPD, remote hx asthma/ alergies as child> not requiring pulm meds; f/u CTChest is due 09/2015...    HBP, Heart murmur at base> on Amlod10, Cardura2Bid, Losar100, HCT25; repeat 2DEcho=> pending    Coronary calcium score >400... on ASA81; he has +FamHx CAD w/ CABG in father, coronary calcif seen on CTChest & a very high coronary calcium score on CardiacCT (8270); he was rec to have CATH but declined- he does not have CP & is very active w/ exercise program; neg ETT 10/15...    Venous insuffic> aware- on HCT25, low sodium, wears support hose, etc...    Hx hyperlipidemia> on diet alone; Cards wanted him on Statin but he was intol to Lip20 w/ joint pain; Labs 07/2015 confirms good Chol values but TG=192 off diet (this will  ret to norm on low fat wt reducing diet).    DM2> on Metform500-2Bid, Onglyza5; Labs 07/2015 showed FBS=183, A1c=9,6 & he admits to poor diet recently & is committed to low carb diet 7 wt reduction now=> plan f/u labs in 35mo..    Obesity> despite intermittently losing some weight, he has not been able to keep it off; we reviewed diet, exercise, weight reduction strategies including bariatric options...    Hx colon polyps, divertics> followed by DRance Muir he is overdue for f/u colon & will set this up at his earliest convenience...    Hx HepB infection> from needle stick exposure 1978 (renal Tx pt), treated conservatively & resolved, LFTs have all been wnl since then...     DJD, s/p L-TKR, s/p L-shoulder replacement> followed by DrAlusio & DrNorris...     Plan:     Patient's Medications  New  Prescriptions   No medications on file  Previous Medications   AMLODIPINE (NORVASC) 10 MG TABLET    Take 1 tablet (10 mg total) by mouth every morning. --need to establish care with new PCP, call our office for appt.   ASPIRIN EC 81 MG TABLET    Take 1 tablet (81 mg total) by mouth daily.   DOXAZOSIN (CARDURA) 2 MG TABLET    Take 1 tablet (2 mg total) by mouth 2 (two) times daily.   GLUCOSAMINE HCL (GLUCOSAMINE PO)    Take 2 tablets by mouth daily.   GLUCOSE BLOOD (TRUE METRIX BLOOD GLUCOSE TEST VI)    Use to test glucose once daily   HYDROCHLOROTHIAZIDE (HYDRODIURIL) 25 MG TABLET    Take 1 tablet (25 mg total) by mouth daily.   LOSARTAN (COZAAR) 100 MG TABLET    Take 1 tablet (100 mg total) by mouth daily.   METFORMIN (GLUCOPHAGE) 500 MG TABLET    2 pills bid after 2 largest meals   MULTIPLE VITAMINS-MINERALS (MULTIVITAMIN PO)    Take 1 tablet by mouth daily.   SAXAGLIPTIN HCL (ONGLYZA) 5 MG TABS TABLET    Take 1 tablet (5 mg total) by mouth daily.  Modified Medications   No medications on file  Discontinued Medications   NOVOFINE 32G X 6 MM MISC    USE DAILY AS DIRECTED

## 2015-08-09 ENCOUNTER — Other Ambulatory Visit (INDEPENDENT_AMBULATORY_CARE_PROVIDER_SITE_OTHER): Payer: 59

## 2015-08-09 DIAGNOSIS — E785 Hyperlipidemia, unspecified: Secondary | ICD-10-CM | POA: Diagnosis not present

## 2015-08-09 DIAGNOSIS — I10 Essential (primary) hypertension: Secondary | ICD-10-CM

## 2015-08-09 LAB — HEPATIC FUNCTION PANEL
ALT: 34 U/L (ref 0–53)
AST: 28 U/L (ref 0–37)
Albumin: 4.3 g/dL (ref 3.5–5.2)
Alkaline Phosphatase: 61 U/L (ref 39–117)
BILIRUBIN DIRECT: 0.1 mg/dL (ref 0.0–0.3)
BILIRUBIN TOTAL: 0.7 mg/dL (ref 0.2–1.2)
TOTAL PROTEIN: 6.8 g/dL (ref 6.0–8.3)

## 2015-08-09 LAB — CBC WITH DIFFERENTIAL/PLATELET
BASOS ABS: 0 10*3/uL (ref 0.0–0.1)
BASOS PCT: 0.6 % (ref 0.0–3.0)
EOS ABS: 0.2 10*3/uL (ref 0.0–0.7)
Eosinophils Relative: 3 % (ref 0.0–5.0)
HCT: 40.7 % (ref 39.0–52.0)
Hemoglobin: 13.9 g/dL (ref 13.0–17.0)
LYMPHS ABS: 1.5 10*3/uL (ref 0.7–4.0)
Lymphocytes Relative: 29.1 % (ref 12.0–46.0)
MCHC: 34.2 g/dL (ref 30.0–36.0)
MCV: 90.7 fl (ref 78.0–100.0)
MONO ABS: 0.5 10*3/uL (ref 0.1–1.0)
Monocytes Relative: 9.8 % (ref 3.0–12.0)
NEUTROS ABS: 3 10*3/uL (ref 1.4–7.7)
NEUTROS PCT: 57.5 % (ref 43.0–77.0)
PLATELETS: 222 10*3/uL (ref 150.0–400.0)
RBC: 4.48 Mil/uL (ref 4.22–5.81)
RDW: 13.2 % (ref 11.5–15.5)
WBC: 5.2 10*3/uL (ref 4.0–10.5)

## 2015-08-09 LAB — LIPID PANEL
Cholesterol: 147 mg/dL (ref 0–200)
HDL: 38.5 mg/dL — ABNORMAL LOW (ref >39.00)
LDL Cholesterol: 70 mg/dL (ref 0–99)
NONHDL: 108.22
TRIGLYCERIDES: 192 mg/dL — AB (ref 0.0–149.0)
Total CHOL/HDL Ratio: 4
VLDL: 38.4 mg/dL (ref 0.0–40.0)

## 2015-08-09 LAB — BASIC METABOLIC PANEL
BUN: 11 mg/dL (ref 6–23)
CHLORIDE: 101 meq/L (ref 96–112)
CO2: 29 mEq/L (ref 19–32)
Calcium: 9.3 mg/dL (ref 8.4–10.5)
Creatinine, Ser: 0.8 mg/dL (ref 0.40–1.50)
GFR: 102.51 mL/min (ref >60.00)
Glucose, Bld: 183 mg/dL — ABNORMAL HIGH (ref 70–99)
POTASSIUM: 4.1 meq/L (ref 3.5–5.1)
Sodium: 139 mEq/L (ref 135–145)

## 2015-08-09 LAB — TSH: TSH: 1.2 u[IU]/mL (ref 0.35–4.50)

## 2015-08-09 LAB — PSA: PSA: 3.82 ng/mL (ref 0.10–4.00)

## 2015-08-09 LAB — HEMOGLOBIN A1C: Hgb A1c MFr Bld: 9.6 % — ABNORMAL HIGH (ref 4.6–6.5)

## 2015-08-12 DIAGNOSIS — G4733 Obstructive sleep apnea (adult) (pediatric): Secondary | ICD-10-CM | POA: Diagnosis not present

## 2015-08-16 DIAGNOSIS — Z96612 Presence of left artificial shoulder joint: Secondary | ICD-10-CM | POA: Diagnosis not present

## 2015-08-16 DIAGNOSIS — M791 Myalgia: Secondary | ICD-10-CM | POA: Diagnosis not present

## 2015-08-16 DIAGNOSIS — M19071 Primary osteoarthritis, right ankle and foot: Secondary | ICD-10-CM | POA: Diagnosis not present

## 2015-08-16 DIAGNOSIS — M9906 Segmental and somatic dysfunction of lower extremity: Secondary | ICD-10-CM | POA: Diagnosis not present

## 2015-08-16 DIAGNOSIS — M25612 Stiffness of left shoulder, not elsewhere classified: Secondary | ICD-10-CM | POA: Diagnosis not present

## 2015-08-16 DIAGNOSIS — Z96652 Presence of left artificial knee joint: Secondary | ICD-10-CM | POA: Diagnosis not present

## 2015-08-16 DIAGNOSIS — M9901 Segmental and somatic dysfunction of cervical region: Secondary | ICD-10-CM | POA: Diagnosis not present

## 2015-08-16 DIAGNOSIS — M50223 Other cervical disc displacement at C6-C7 level: Secondary | ICD-10-CM | POA: Diagnosis not present

## 2015-08-16 DIAGNOSIS — M9902 Segmental and somatic dysfunction of thoracic region: Secondary | ICD-10-CM | POA: Diagnosis not present

## 2015-08-17 ENCOUNTER — Ambulatory Visit (HOSPITAL_COMMUNITY): Payer: 59 | Attending: Pulmonary Disease

## 2015-08-17 ENCOUNTER — Other Ambulatory Visit: Payer: Self-pay

## 2015-08-17 DIAGNOSIS — R011 Cardiac murmur, unspecified: Secondary | ICD-10-CM | POA: Diagnosis not present

## 2015-08-17 DIAGNOSIS — G4733 Obstructive sleep apnea (adult) (pediatric): Secondary | ICD-10-CM | POA: Insufficient documentation

## 2015-08-17 DIAGNOSIS — Z8249 Family history of ischemic heart disease and other diseases of the circulatory system: Secondary | ICD-10-CM | POA: Insufficient documentation

## 2015-08-17 DIAGNOSIS — E669 Obesity, unspecified: Secondary | ICD-10-CM | POA: Diagnosis not present

## 2015-08-17 DIAGNOSIS — I350 Nonrheumatic aortic (valve) stenosis: Secondary | ICD-10-CM | POA: Insufficient documentation

## 2015-08-17 DIAGNOSIS — I10 Essential (primary) hypertension: Secondary | ICD-10-CM | POA: Insufficient documentation

## 2015-08-17 DIAGNOSIS — E119 Type 2 diabetes mellitus without complications: Secondary | ICD-10-CM | POA: Diagnosis not present

## 2015-08-17 DIAGNOSIS — Z6837 Body mass index (BMI) 37.0-37.9, adult: Secondary | ICD-10-CM | POA: Diagnosis not present

## 2015-08-17 DIAGNOSIS — E785 Hyperlipidemia, unspecified: Secondary | ICD-10-CM | POA: Diagnosis not present

## 2015-08-22 DIAGNOSIS — G4733 Obstructive sleep apnea (adult) (pediatric): Secondary | ICD-10-CM | POA: Diagnosis not present

## 2015-08-24 ENCOUNTER — Ambulatory Visit: Payer: 59 | Admitting: Pharmacist

## 2015-09-05 ENCOUNTER — Other Ambulatory Visit: Payer: Self-pay | Admitting: Internal Medicine

## 2015-09-05 MED FILL — TRUE METRIX GLUCOSE TEST ST: 50 days supply | Qty: 100 | Fill #2

## 2015-09-07 ENCOUNTER — Telehealth: Payer: Self-pay | Admitting: Pulmonary Disease

## 2015-09-07 MED ORDER — LOSARTAN POTASSIUM 100 MG PO TABS: 100.0000 mg | ORAL_TABLET | Freq: Every day | ORAL | Status: DC

## 2015-09-07 MED FILL — ONGLYZA 5 MG TABLET: 5 | 30 days supply | Qty: 30 | Fill #1

## 2015-09-07 MED FILL — LOSARTAN POTASSIUM 100 MG T: 100 | 90 days supply | Qty: 90 | Fill #0

## 2015-09-07 NOTE — Telephone Encounter (Signed)
Called pt. He needs the losartan sent to [facility] outpatient pharmacy. I have done so. Nothing further needed

## 2015-09-13 DIAGNOSIS — M791 Myalgia: Secondary | ICD-10-CM | POA: Diagnosis not present

## 2015-09-13 DIAGNOSIS — M50223 Other cervical disc displacement at C6-C7 level: Secondary | ICD-10-CM | POA: Diagnosis not present

## 2015-09-13 DIAGNOSIS — M9902 Segmental and somatic dysfunction of thoracic region: Secondary | ICD-10-CM | POA: Diagnosis not present

## 2015-09-13 DIAGNOSIS — M19071 Primary osteoarthritis, right ankle and foot: Secondary | ICD-10-CM | POA: Diagnosis not present

## 2015-09-13 DIAGNOSIS — M9901 Segmental and somatic dysfunction of cervical region: Secondary | ICD-10-CM | POA: Diagnosis not present

## 2015-09-13 DIAGNOSIS — Z96612 Presence of left artificial shoulder joint: Secondary | ICD-10-CM | POA: Diagnosis not present

## 2015-09-13 DIAGNOSIS — Z96652 Presence of left artificial knee joint: Secondary | ICD-10-CM | POA: Diagnosis not present

## 2015-09-13 DIAGNOSIS — M9906 Segmental and somatic dysfunction of lower extremity: Secondary | ICD-10-CM | POA: Diagnosis not present

## 2015-09-13 DIAGNOSIS — M25612 Stiffness of left shoulder, not elsewhere classified: Secondary | ICD-10-CM | POA: Diagnosis not present

## 2015-09-14 ENCOUNTER — Encounter: Payer: Self-pay | Admitting: Pharmacist

## 2015-09-14 ENCOUNTER — Ambulatory Visit (INDEPENDENT_AMBULATORY_CARE_PROVIDER_SITE_OTHER): Payer: Self-pay | Admitting: Family Medicine

## 2015-09-14 VITALS — BP 143/76 | Wt 292.0 lb

## 2015-09-14 DIAGNOSIS — E118 Type 2 diabetes mellitus with unspecified complications: Secondary | ICD-10-CM

## 2015-09-14 NOTE — Patient Instructions (Signed)
1)  Continue to maintain daily exercise 2)  Continue to make healthy dietary choices, great job with improvement in this area 3)  Continue testing daily, great job with improved fasting glucose 4)  Goal of reduced A1c by next PCP visit 4)  Follow-up with [facility] on Thursday July 13th @ 1:30 pm  Great to see you today!

## 2015-09-14 NOTE — Progress Notes (Signed)
Subjective:  Patient presents today for 3 month diabetes follow-up as part of the employer-sponsored [facility] program.  Current diabetes regimen includes metformin and onglyza.  Patient also continues on daily ASA and ARB, but is not on a statin.  Instead has chosen to manage hyperlipidemia with diet and exercise.  Most recent MD follow-up, Jan 2017.  At this appt pt established care with new provider as current is no longer practicing.  He will now see Dr. Lenna Gilford at the same practice.    Assessment/Plan:  Patient is a 65 yo male with DM type 2. Most recent A1C via MD office was 9.6% (prev 8.7%), further elevated and remaining above goal of less than 7%. Weight has decreased by 9 lb since last visit.  Patient is compliant with medications.  He is now [name] for cost savings.  Did not bring meter today but is testing once daily, fasting.  Per patients glucose records, glucose is trending down.  At last visit fasting glucose was consistently >200; however, it has steadily declined over the previous two months and is now running 120-150s fasting.  No hypoglycemia.  Up to date on eye and dental exams.  No signs of neuropathy or infection at this time.  Patient does report mild intermittent itching of feet primarily at night, which he believes may be associated with increased glucose.    Lifestyle:  Physical Activity-  Patient has resumed full physical activity.  He is now exercising at gym daily for at least 60 minutes, including cardio and weight training.  In addition he is attending a 1 hour boot camp once weekly and is also meeting with a personal trainer once weekly.     Nutrition-  Patient has made significant changes to diet recently and is motivated to improve A1c, glucose and weight.  He is eating less "junk food" and has worked to improve portion control.  He is also eating supper earlier in the evening and making better overall choices.  Breakfast has improved  significantly.  In the past he was eating muffins, bacon biscuits, and sometimes fruit.  He is now eating oatmeal and fruit.  Lunch choices have improved and patient is now choosing healthier options from the cafeteria or opting for a protein bar.     Goals for Next Visit: 1)  Continue to maintain daily exercise 2)  Continue to make healthy dietary choices, great job with improvement in this area 3)  Continue testing daily, great job with improved fasting glucose 4)  Goal of reduced A1c by next PCP visit 4)  Follow-up with [facility] on Thursday July 13th @ 1:30 pm  Great to see you today!   Tilman Neat, PharmD [facility]

## 2015-09-18 ENCOUNTER — Other Ambulatory Visit: Payer: Self-pay | Admitting: Emergency Medicine

## 2015-09-18 ENCOUNTER — Other Ambulatory Visit: Payer: Self-pay | Admitting: Internal Medicine

## 2015-09-18 MED ORDER — SAXAGLIPTIN HCL 5 MG PO TABS: 5.0000 mg | ORAL_TABLET | Freq: Every day | ORAL | Status: DC

## 2015-09-18 MED FILL — AMOXICILLIN 500 MG CAPSULE: 500 | 4 days supply | Qty: 16 | Fill #1

## 2015-09-18 MED FILL — AMLODIPINE BESYLATE 10 MG T: 10 | 90 days supply | Qty: 90 | Fill #1

## 2015-09-18 NOTE — Telephone Encounter (Signed)
Patient is dr Rodney Hayes patient---sending rx refill request to dr Rodney Hayes

## 2015-09-21 ENCOUNTER — Other Ambulatory Visit: Payer: Self-pay | Admitting: Pulmonary Disease

## 2015-09-21 DIAGNOSIS — I10 Essential (primary) hypertension: Secondary | ICD-10-CM

## 2015-09-21 MED ORDER — CARVEDILOL 3.125 MG PO TABS: 3.1250 mg | ORAL_TABLET | Freq: Two times a day (BID) | ORAL | Status: DC

## 2015-09-21 MED ORDER — HYDROCHLOROTHIAZIDE 25 MG PO TABS: 25.0000 mg | ORAL_TABLET | Freq: Every day | ORAL | Status: DC

## 2015-09-21 MED ORDER — LOSARTAN POTASSIUM 100 MG PO TABS: 100.0000 mg | ORAL_TABLET | Freq: Every day | ORAL | Status: DC

## 2015-09-21 MED ORDER — SAXAGLIPTIN HCL 5 MG PO TABS: 5.0000 mg | ORAL_TABLET | Freq: Every day | ORAL | Status: DC

## 2015-09-21 MED ORDER — DOXAZOSIN MESYLATE 2 MG PO TABS: 2.0000 mg | ORAL_TABLET | Freq: Two times a day (BID) | ORAL | Status: DC

## 2015-09-21 MED ORDER — METFORMIN HCL 500 MG PO TABS: ORAL_TABLET | ORAL | Status: DC

## 2015-09-21 MED ORDER — AMLODIPINE BESYLATE 10 MG PO TABS: 10.0000 mg | ORAL_TABLET | Freq: Every morning | ORAL | Status: DC

## 2015-09-21 MED FILL — metFORMIN HCL 500 MG TABS: 500 | 90 days supply | Qty: 360 | Fill #0

## 2015-09-21 MED FILL — DOXAZOSIN MESYLATE 2 MG TAB: 2 | 90 days supply | Qty: 180 | Fill #0

## 2015-09-21 MED FILL — CARVEDILOL 3.125 MG TABLET: 3.125 | 90 days supply | Qty: 180 | Fill #0

## 2015-09-21 MED FILL — HYDROCHLOROTHIAZIDE 25 MG T: 25 | 90 days supply | Qty: 90 | Fill #0

## 2015-09-21 NOTE — Telephone Encounter (Signed)
All meds refilled x1 year Pt is already aware Will sign off

## 2015-09-26 MED FILL — ONGLYZA 5 MG TABLET: 5 | 90 days supply | Qty: 90 | Fill #0

## 2015-10-11 DIAGNOSIS — M19071 Primary osteoarthritis, right ankle and foot: Secondary | ICD-10-CM | POA: Diagnosis not present

## 2015-10-11 DIAGNOSIS — M9906 Segmental and somatic dysfunction of lower extremity: Secondary | ICD-10-CM | POA: Diagnosis not present

## 2015-10-11 DIAGNOSIS — M9902 Segmental and somatic dysfunction of thoracic region: Secondary | ICD-10-CM | POA: Diagnosis not present

## 2015-10-11 DIAGNOSIS — M50223 Other cervical disc displacement at C6-C7 level: Secondary | ICD-10-CM | POA: Diagnosis not present

## 2015-10-11 DIAGNOSIS — Z96652 Presence of left artificial knee joint: Secondary | ICD-10-CM | POA: Diagnosis not present

## 2015-10-11 DIAGNOSIS — M791 Myalgia: Secondary | ICD-10-CM | POA: Diagnosis not present

## 2015-10-11 DIAGNOSIS — M25612 Stiffness of left shoulder, not elsewhere classified: Secondary | ICD-10-CM | POA: Diagnosis not present

## 2015-10-11 DIAGNOSIS — M9901 Segmental and somatic dysfunction of cervical region: Secondary | ICD-10-CM | POA: Diagnosis not present

## 2015-10-11 DIAGNOSIS — Z96612 Presence of left artificial shoulder joint: Secondary | ICD-10-CM | POA: Diagnosis not present

## 2015-10-25 MED FILL — IBUPROFEN 600 MG TABLET: 600 | 5 days supply | Qty: 16 | Fill #0

## 2015-10-25 MED FILL — AMOXICILLIN 500 MG CAPSULE: 500 | 7 days supply | Qty: 25 | Fill #0

## 2015-10-25 MED FILL — HYDROCODON-APAP 5-325: 5-325 | 3 days supply | Qty: 10 | Fill #0

## 2015-10-26 NOTE — Progress Notes (Signed)
I have reviewed this pharmacist's note and agree  

## 2015-11-08 DIAGNOSIS — M9902 Segmental and somatic dysfunction of thoracic region: Secondary | ICD-10-CM | POA: Diagnosis not present

## 2015-11-08 DIAGNOSIS — M19071 Primary osteoarthritis, right ankle and foot: Secondary | ICD-10-CM | POA: Diagnosis not present

## 2015-11-08 DIAGNOSIS — M50223 Other cervical disc displacement at C6-C7 level: Secondary | ICD-10-CM | POA: Diagnosis not present

## 2015-11-08 DIAGNOSIS — M25612 Stiffness of left shoulder, not elsewhere classified: Secondary | ICD-10-CM | POA: Diagnosis not present

## 2015-11-08 DIAGNOSIS — M9901 Segmental and somatic dysfunction of cervical region: Secondary | ICD-10-CM | POA: Diagnosis not present

## 2015-11-08 DIAGNOSIS — Z96652 Presence of left artificial knee joint: Secondary | ICD-10-CM | POA: Diagnosis not present

## 2015-11-08 DIAGNOSIS — Z96612 Presence of left artificial shoulder joint: Secondary | ICD-10-CM | POA: Diagnosis not present

## 2015-11-08 DIAGNOSIS — M9906 Segmental and somatic dysfunction of lower extremity: Secondary | ICD-10-CM | POA: Diagnosis not present

## 2015-11-08 DIAGNOSIS — M791 Myalgia: Secondary | ICD-10-CM | POA: Diagnosis not present

## 2015-11-14 ENCOUNTER — Other Ambulatory Visit: Payer: Self-pay | Admitting: Pulmonary Disease

## 2015-11-14 DIAGNOSIS — R911 Solitary pulmonary nodule: Secondary | ICD-10-CM

## 2015-11-14 DIAGNOSIS — G4733 Obstructive sleep apnea (adult) (pediatric): Secondary | ICD-10-CM | POA: Diagnosis not present

## 2015-11-20 ENCOUNTER — Ambulatory Visit (INDEPENDENT_AMBULATORY_CARE_PROVIDER_SITE_OTHER)
Admission: RE | Admit: 2015-11-20 | Discharge: 2015-11-20 | Disposition: A | Discharge status: Home / Self Care | Payer: 59 | Source: Ambulatory Visit | Attending: Pulmonary Disease | Admitting: Pulmonary Disease

## 2015-11-20 DIAGNOSIS — R911 Solitary pulmonary nodule: Secondary | ICD-10-CM

## 2015-11-20 DIAGNOSIS — R918 Other nonspecific abnormal finding of lung field: Secondary | ICD-10-CM | POA: Diagnosis not present

## 2015-12-12 MED FILL — TRUE METRIX GLUCOSE TEST ST: 50 days supply | Qty: 100 | Fill #3

## 2015-12-12 MED FILL — LOSARTAN POTASSIUM 100 MG T: 100 | 90 days supply | Qty: 90 | Fill #1

## 2015-12-13 DIAGNOSIS — Z96612 Presence of left artificial shoulder joint: Secondary | ICD-10-CM | POA: Diagnosis not present

## 2015-12-13 DIAGNOSIS — M9901 Segmental and somatic dysfunction of cervical region: Secondary | ICD-10-CM | POA: Diagnosis not present

## 2015-12-13 DIAGNOSIS — M9902 Segmental and somatic dysfunction of thoracic region: Secondary | ICD-10-CM | POA: Diagnosis not present

## 2015-12-13 DIAGNOSIS — M25612 Stiffness of left shoulder, not elsewhere classified: Secondary | ICD-10-CM | POA: Diagnosis not present

## 2015-12-13 DIAGNOSIS — M791 Myalgia: Secondary | ICD-10-CM | POA: Diagnosis not present

## 2015-12-13 DIAGNOSIS — Z96652 Presence of left artificial knee joint: Secondary | ICD-10-CM | POA: Diagnosis not present

## 2015-12-13 DIAGNOSIS — M50223 Other cervical disc displacement at C6-C7 level: Secondary | ICD-10-CM | POA: Diagnosis not present

## 2015-12-13 DIAGNOSIS — M19071 Primary osteoarthritis, right ankle and foot: Secondary | ICD-10-CM | POA: Diagnosis not present

## 2015-12-13 DIAGNOSIS — M9906 Segmental and somatic dysfunction of lower extremity: Secondary | ICD-10-CM | POA: Diagnosis not present

## 2015-12-25 MED FILL — DOXAZOSIN MESYLATE 2 MG TAB: 2 | 90 days supply | Qty: 180 | Fill #1

## 2015-12-25 MED FILL — metFORMIN HCL 500 MG TABS: 500 | 90 days supply | Qty: 360 | Fill #1

## 2015-12-25 MED FILL — CARVEDILOL 3.125 MG TABLET: 3.125 | 90 days supply | Qty: 180 | Fill #1

## 2015-12-25 MED FILL — AMLODIPINE BESYLATE 10 MG T: 10 | 90 days supply | Qty: 90 | Fill #0

## 2015-12-25 MED FILL — HYDROCHLOROTHIAZIDE 25 MG T: 25 | 90 days supply | Qty: 90 | Fill #1

## 2015-12-27 MED FILL — ONGLYZA 5 MG TABLET: 5 | 90 days supply | Qty: 90 | Fill #1

## 2016-01-10 DIAGNOSIS — M791 Myalgia: Secondary | ICD-10-CM | POA: Diagnosis not present

## 2016-01-10 DIAGNOSIS — M19071 Primary osteoarthritis, right ankle and foot: Secondary | ICD-10-CM | POA: Diagnosis not present

## 2016-01-10 DIAGNOSIS — Z96652 Presence of left artificial knee joint: Secondary | ICD-10-CM | POA: Diagnosis not present

## 2016-01-10 DIAGNOSIS — M9902 Segmental and somatic dysfunction of thoracic region: Secondary | ICD-10-CM | POA: Diagnosis not present

## 2016-01-10 DIAGNOSIS — Z96612 Presence of left artificial shoulder joint: Secondary | ICD-10-CM | POA: Diagnosis not present

## 2016-01-10 DIAGNOSIS — M9901 Segmental and somatic dysfunction of cervical region: Secondary | ICD-10-CM | POA: Diagnosis not present

## 2016-01-10 DIAGNOSIS — M25612 Stiffness of left shoulder, not elsewhere classified: Secondary | ICD-10-CM | POA: Diagnosis not present

## 2016-01-10 DIAGNOSIS — M9906 Segmental and somatic dysfunction of lower extremity: Secondary | ICD-10-CM | POA: Diagnosis not present

## 2016-01-10 DIAGNOSIS — M50223 Other cervical disc displacement at C6-C7 level: Secondary | ICD-10-CM | POA: Diagnosis not present

## 2016-01-25 ENCOUNTER — Other Ambulatory Visit: Payer: Self-pay | Admitting: Pharmacist

## 2016-01-25 ENCOUNTER — Encounter: Payer: Self-pay | Admitting: Pharmacist

## 2016-01-25 VITALS — BP 136/72 | Wt 301.0 lb

## 2016-01-25 DIAGNOSIS — E119 Type 2 diabetes mellitus without complications: Secondary | ICD-10-CM

## 2016-01-25 MED FILL — TRUE METRIX GLUCOSE TEST ST: 50 days supply | Qty: 100 | Fill #4

## 2016-01-25 NOTE — Patient Outreach (Signed)
[facility]) Care Management  01/25/2016  Rodney Hayes Jul 18, 1950 ID:[id number]   Subjective:  Patient presents today for 3 month diabetes follow-up as part of the employer-sponsored [facility] program. Current diabetes regimen includes metformin and onglyza. Patient also continues on daily ASA and ARB, but is not on a statin. Instead has chosen to manage hyperlipidemia with diet and exercise. Most recent MD follow-up, Jan 2017.  Will follow-up again in 1 year.     Assessment/Plan:  Patient is a 65 yo male with DM type 2. Most recent A1C via MD office was 9.6% (prev 8.7%), further elevated and remaining above goal of less than 7%. Weight has increased by 8 lb since last visit. Patient is compliant with medications. He is now [name] for cost savings. Did not bring meter today but is testing once daily, fasting. Per patients glucose records, glucose is trending down. At last visit fasting glucose was consistently >200; however, it has steadily declined over the previous two months and is now running 110-150s fasting. No hypoglycemia. Up to date on eye and dental exams. No signs of neuropathy or infection at this time. Patient does report mild intermittent itching of feet primarily at night, which he believes may be associated with increased glucose.   Lowest 107, highest 150 in AM if skips exercise or eats a midnight snack     Lifestyle:  Physical Activity-  Patient maintains regular physical activity. He works with a Physiological scientist twice weekly including cardio and weight training.  He also does pilates once weekly.  In addition he does his own exercise regimen twice weekly.  This totals 5 workout days per week.    Nutrition-  Patient has made significant changes to diet recently and is motivated to improve A1c, glucose and weight. He is eating less "junk food" and has worked to improve portion control. He is also eating supper  earlier in the evening and making better overall choices. Breakfast has improved significantly.He is now eating oatmeal and fruit. He continues to struggle with diet but has made improvements.  Breakfast - oatmeal Lunch - leftover spaghetti Dinner - chicken and vegetables, sometimes rice, meals are pre prepared.  Blue apron.  Portion control is better with this approach.  Ordering out less.   Snacks - peanuts, icecream cones 1-2 times per week, night time snack once weekly. Beverages - water mostly, diet drinks maybe once per week   Goals for Next Visit: 1) Continue to maintain daily exercise 2) Continue to make healthy dietary choices, great job with improvement in this area 3) Continue testing daily, great job with improved fasting glucose 4) Goal of reduced A1c by next PCP visit 5) Follow-up with [facility] on Thursday October 12th @ 1:30 pm  Great to see you today!

## 2016-02-14 DIAGNOSIS — M50223 Other cervical disc displacement at C6-C7 level: Secondary | ICD-10-CM | POA: Diagnosis not present

## 2016-02-14 DIAGNOSIS — Z96652 Presence of left artificial knee joint: Secondary | ICD-10-CM | POA: Diagnosis not present

## 2016-02-14 DIAGNOSIS — M25612 Stiffness of left shoulder, not elsewhere classified: Secondary | ICD-10-CM | POA: Diagnosis not present

## 2016-02-14 DIAGNOSIS — M19071 Primary osteoarthritis, right ankle and foot: Secondary | ICD-10-CM | POA: Diagnosis not present

## 2016-02-14 DIAGNOSIS — M9906 Segmental and somatic dysfunction of lower extremity: Secondary | ICD-10-CM | POA: Diagnosis not present

## 2016-02-14 DIAGNOSIS — Z96612 Presence of left artificial shoulder joint: Secondary | ICD-10-CM | POA: Diagnosis not present

## 2016-02-14 DIAGNOSIS — M9902 Segmental and somatic dysfunction of thoracic region: Secondary | ICD-10-CM | POA: Diagnosis not present

## 2016-02-14 DIAGNOSIS — M791 Myalgia: Secondary | ICD-10-CM | POA: Diagnosis not present

## 2016-02-14 DIAGNOSIS — M9901 Segmental and somatic dysfunction of cervical region: Secondary | ICD-10-CM | POA: Diagnosis not present

## 2016-02-19 DIAGNOSIS — G4733 Obstructive sleep apnea (adult) (pediatric): Secondary | ICD-10-CM | POA: Diagnosis not present

## 2016-03-19 MED FILL — metFORMIN HCL 500 MG TABS: 500 | 90 days supply | Qty: 360 | Fill #2

## 2016-03-19 MED FILL — LOSARTAN POTASSIUM 100 MG T: 100 | 90 days supply | Qty: 90 | Fill #2

## 2016-03-20 DIAGNOSIS — M9901 Segmental and somatic dysfunction of cervical region: Secondary | ICD-10-CM | POA: Diagnosis not present

## 2016-03-20 DIAGNOSIS — Z96612 Presence of left artificial shoulder joint: Secondary | ICD-10-CM | POA: Diagnosis not present

## 2016-03-20 DIAGNOSIS — M791 Myalgia: Secondary | ICD-10-CM | POA: Diagnosis not present

## 2016-03-20 DIAGNOSIS — Z96652 Presence of left artificial knee joint: Secondary | ICD-10-CM | POA: Diagnosis not present

## 2016-03-20 DIAGNOSIS — M19071 Primary osteoarthritis, right ankle and foot: Secondary | ICD-10-CM | POA: Diagnosis not present

## 2016-03-20 DIAGNOSIS — M25612 Stiffness of left shoulder, not elsewhere classified: Secondary | ICD-10-CM | POA: Diagnosis not present

## 2016-03-20 DIAGNOSIS — M9906 Segmental and somatic dysfunction of lower extremity: Secondary | ICD-10-CM | POA: Diagnosis not present

## 2016-03-20 DIAGNOSIS — M50223 Other cervical disc displacement at C6-C7 level: Secondary | ICD-10-CM | POA: Diagnosis not present

## 2016-03-20 DIAGNOSIS — M9902 Segmental and somatic dysfunction of thoracic region: Secondary | ICD-10-CM | POA: Diagnosis not present

## 2016-03-28 MED FILL — AMLODIPINE BESYLATE 10 MG T: 10 | 90 days supply | Qty: 90 | Fill #1

## 2016-03-28 MED FILL — HYDROCHLOROTHIAZIDE 25 MG T: 25 | 90 days supply | Qty: 90 | Fill #2

## 2016-03-28 MED FILL — DOXAZOSIN MESYLATE 2 MG TAB: 2 | 90 days supply | Qty: 180 | Fill #2

## 2016-03-29 MED FILL — ONGLYZA 5 MG TABLET: 5 | 90 days supply | Qty: 90 | Fill #2

## 2016-04-17 DIAGNOSIS — M25612 Stiffness of left shoulder, not elsewhere classified: Secondary | ICD-10-CM | POA: Diagnosis not present

## 2016-04-17 DIAGNOSIS — M50223 Other cervical disc displacement at C6-C7 level: Secondary | ICD-10-CM | POA: Diagnosis not present

## 2016-04-17 DIAGNOSIS — Z96612 Presence of left artificial shoulder joint: Secondary | ICD-10-CM | POA: Diagnosis not present

## 2016-04-17 DIAGNOSIS — M791 Myalgia: Secondary | ICD-10-CM | POA: Diagnosis not present

## 2016-04-17 DIAGNOSIS — M9901 Segmental and somatic dysfunction of cervical region: Secondary | ICD-10-CM | POA: Diagnosis not present

## 2016-04-17 DIAGNOSIS — M9902 Segmental and somatic dysfunction of thoracic region: Secondary | ICD-10-CM | POA: Diagnosis not present

## 2016-04-17 DIAGNOSIS — M9906 Segmental and somatic dysfunction of lower extremity: Secondary | ICD-10-CM | POA: Diagnosis not present

## 2016-04-17 DIAGNOSIS — M19071 Primary osteoarthritis, right ankle and foot: Secondary | ICD-10-CM | POA: Diagnosis not present

## 2016-04-17 DIAGNOSIS — Z96652 Presence of left artificial knee joint: Secondary | ICD-10-CM | POA: Diagnosis not present

## 2016-04-25 ENCOUNTER — Ambulatory Visit: Payer: Self-pay | Admitting: Pharmacist

## 2016-05-16 ENCOUNTER — Other Ambulatory Visit: Payer: Self-pay | Admitting: Pharmacist

## 2016-05-16 ENCOUNTER — Encounter: Payer: Self-pay | Admitting: Pharmacist

## 2016-05-16 VITALS — BP 132/74 | Wt 306.0 lb

## 2016-05-16 DIAGNOSIS — E118 Type 2 diabetes mellitus with unspecified complications: Secondary | ICD-10-CM

## 2016-05-16 NOTE — Patient Outreach (Signed)
[facility]) Care Management  05/16/2016  Rodney Hayes 02-15-1951 [id number]   Subjective:  Patient presents today for 3 month diabetes follow-up as part of the employer-sponsored Link to Wellness program. Current diabetes regimen includes metformin and onglyza. Patient would like to resume Kombiglyze as he feels this was more effective than metformin plus onglyza. I will request change from MD. Patient also continues on daily ASA and ARB, but is not on a statin. Instead has chosen to manage hyperlipidemia with diet and exercise. Most recent MD follow-up, Jan 2017.  Will follow-up again in 1 year.     Assessment/Plan:  Patient is a 65 yo male with DM type 2. Most recent A1C via MD office was 9.6% (prev 8.7%), further elevated and remaining above goal of less than 7%. Weight has increased by an additional 5 lb since last visit. Patient is compliant with medications. He is now [name] for cost savings. Did not bring meter today but is testing once daily, fasting. Per patients glucose records, glucose is trending down and is now consistently 170 or less in AM. At last visit fasting glucose was consistently >200; however, it has steadily declined over the previous 2-3 months. No hypoglycemia. Up to date on eye and dental exams. No signs of neuropathy or infection at this time. Patient does report mild intermittent itching of feet primarily at night, which he believes may be associated with increased glucose.    Lifestyle:  Physical Activity-  Patient maintains regular physical activity. He works with a Physiological scientist twice weekly including cardio and weight training.  He also does pilates once weekly.  In addition he does his own exercise regimen twice weekly.  This totals 5 workout days per week. Patient is very consistent with exercise.   Nutrition-  Patient has made significant changes to diet recently and is motivated to improve A1c,  glucose and weight. He is eating less "junk food" and has worked to improve portion control. He is also eating supper earlier in the evening and making better overall choices. Breakfast has improved significantly.He is now eating oatmeal and fruit. He has also attempted to better manage portion size.  He continues to struggle with diet but has made improvements.  He does report struggling with strong cravings, specifically after exercise. Cravings are almost always for carbohydrates.    Goals for Next Visit: 1) Continue to maintain daily exercise 2) Continue to make healthy dietary choices, great job with improvement in this area 3) Continue testing daily, great job with improved fasting glucose 4) Goal of reduced A1c by next PCP visit 5) Consider joining [employer] as a Link to [employer] alternative to [employer].   [email or web address] 6)  Someone from [facility] will contact you regarding your continued enrollment in Link to Wellness.   Great to see you today!

## 2016-05-22 DIAGNOSIS — M9906 Segmental and somatic dysfunction of lower extremity: Secondary | ICD-10-CM | POA: Diagnosis not present

## 2016-05-22 DIAGNOSIS — M9902 Segmental and somatic dysfunction of thoracic region: Secondary | ICD-10-CM | POA: Diagnosis not present

## 2016-05-22 DIAGNOSIS — M791 Myalgia: Secondary | ICD-10-CM | POA: Diagnosis not present

## 2016-05-22 DIAGNOSIS — M50223 Other cervical disc displacement at C6-C7 level: Secondary | ICD-10-CM | POA: Diagnosis not present

## 2016-05-22 DIAGNOSIS — Z96652 Presence of left artificial knee joint: Secondary | ICD-10-CM | POA: Diagnosis not present

## 2016-05-22 DIAGNOSIS — Z96612 Presence of left artificial shoulder joint: Secondary | ICD-10-CM | POA: Diagnosis not present

## 2016-05-22 DIAGNOSIS — M19071 Primary osteoarthritis, right ankle and foot: Secondary | ICD-10-CM | POA: Diagnosis not present

## 2016-05-22 DIAGNOSIS — M9901 Segmental and somatic dysfunction of cervical region: Secondary | ICD-10-CM | POA: Diagnosis not present

## 2016-05-22 DIAGNOSIS — M25612 Stiffness of left shoulder, not elsewhere classified: Secondary | ICD-10-CM | POA: Diagnosis not present

## 2016-05-28 DIAGNOSIS — G4733 Obstructive sleep apnea (adult) (pediatric): Secondary | ICD-10-CM | POA: Diagnosis not present

## 2016-06-03 ENCOUNTER — Encounter: Payer: Self-pay | Admitting: *Deleted

## 2016-06-03 ENCOUNTER — Other Ambulatory Visit: Payer: Self-pay | Admitting: Pulmonary Disease

## 2016-06-03 DIAGNOSIS — E1169 Type 2 diabetes mellitus with other specified complication: Secondary | ICD-10-CM

## 2016-06-03 DIAGNOSIS — I10 Essential (primary) hypertension: Secondary | ICD-10-CM

## 2016-06-03 DIAGNOSIS — E669 Obesity, unspecified: Secondary | ICD-10-CM

## 2016-06-03 DIAGNOSIS — E785 Hyperlipidemia, unspecified: Secondary | ICD-10-CM

## 2016-06-03 DIAGNOSIS — R972 Elevated prostate specific antigen [PSA]: Secondary | ICD-10-CM

## 2016-06-05 ENCOUNTER — Encounter: Payer: Self-pay | Admitting: Pulmonary Disease

## 2016-06-05 DIAGNOSIS — H35033 Hypertensive retinopathy, bilateral: Secondary | ICD-10-CM | POA: Diagnosis not present

## 2016-06-05 DIAGNOSIS — H25013 Cortical age-related cataract, bilateral: Secondary | ICD-10-CM | POA: Diagnosis not present

## 2016-06-05 DIAGNOSIS — E119 Type 2 diabetes mellitus without complications: Secondary | ICD-10-CM | POA: Diagnosis not present

## 2016-06-05 DIAGNOSIS — H2513 Age-related nuclear cataract, bilateral: Secondary | ICD-10-CM | POA: Diagnosis not present

## 2016-06-05 LAB — HM DIABETES EYE EXAM

## 2016-06-10 MED FILL — LOSARTAN POTASSIUM 100 MG T: 100 | 90 days supply | Qty: 90 | Fill #3

## 2016-06-19 DIAGNOSIS — M9906 Segmental and somatic dysfunction of lower extremity: Secondary | ICD-10-CM | POA: Diagnosis not present

## 2016-06-19 DIAGNOSIS — M19071 Primary osteoarthritis, right ankle and foot: Secondary | ICD-10-CM | POA: Diagnosis not present

## 2016-06-19 DIAGNOSIS — M25612 Stiffness of left shoulder, not elsewhere classified: Secondary | ICD-10-CM | POA: Diagnosis not present

## 2016-06-19 DIAGNOSIS — M9902 Segmental and somatic dysfunction of thoracic region: Secondary | ICD-10-CM | POA: Diagnosis not present

## 2016-06-19 DIAGNOSIS — M9901 Segmental and somatic dysfunction of cervical region: Secondary | ICD-10-CM | POA: Diagnosis not present

## 2016-06-19 DIAGNOSIS — M50223 Other cervical disc displacement at C6-C7 level: Secondary | ICD-10-CM | POA: Diagnosis not present

## 2016-06-19 DIAGNOSIS — M791 Myalgia: Secondary | ICD-10-CM | POA: Diagnosis not present

## 2016-06-19 DIAGNOSIS — Z96652 Presence of left artificial knee joint: Secondary | ICD-10-CM | POA: Diagnosis not present

## 2016-06-19 DIAGNOSIS — Z96612 Presence of left artificial shoulder joint: Secondary | ICD-10-CM | POA: Diagnosis not present

## 2016-06-24 ENCOUNTER — Other Ambulatory Visit: Payer: Self-pay | Admitting: *Deleted

## 2016-06-24 MED FILL — ONGLYZA 5 MG TABLET: 5 | 90 days supply | Qty: 90 | Fill #3

## 2016-06-24 MED FILL — metFORMIN HCL 500 MG TABS: 500 | 90 days supply | Qty: 360 | Fill #3

## 2016-06-24 MED FILL — HYDROCHLOROTHIAZIDE 25 MG T: 25 | 90 days supply | Qty: 90 | Fill #3

## 2016-06-24 MED FILL — DOXAZOSIN MESYLATE 2 MG TAB: 2 | 90 days supply | Qty: 180 | Fill #3

## 2016-06-24 MED FILL — AMLODIPINE BESYLATE 10 MG T: 10 | 90 days supply | Qty: 90 | Fill #2

## 2016-06-28 MED FILL — TRUE METRIX GLUCOSE TEST ST: 50 days supply | Qty: 100 | Fill #0

## 2016-07-17 DIAGNOSIS — M25612 Stiffness of left shoulder, not elsewhere classified: Secondary | ICD-10-CM | POA: Diagnosis not present

## 2016-07-17 DIAGNOSIS — Z96612 Presence of left artificial shoulder joint: Secondary | ICD-10-CM | POA: Diagnosis not present

## 2016-07-17 DIAGNOSIS — M9901 Segmental and somatic dysfunction of cervical region: Secondary | ICD-10-CM | POA: Diagnosis not present

## 2016-07-17 DIAGNOSIS — M9906 Segmental and somatic dysfunction of lower extremity: Secondary | ICD-10-CM | POA: Diagnosis not present

## 2016-07-17 DIAGNOSIS — M791 Myalgia: Secondary | ICD-10-CM | POA: Diagnosis not present

## 2016-07-17 DIAGNOSIS — M50223 Other cervical disc displacement at C6-C7 level: Secondary | ICD-10-CM | POA: Diagnosis not present

## 2016-07-17 DIAGNOSIS — M19071 Primary osteoarthritis, right ankle and foot: Secondary | ICD-10-CM | POA: Diagnosis not present

## 2016-07-17 DIAGNOSIS — M9902 Segmental and somatic dysfunction of thoracic region: Secondary | ICD-10-CM | POA: Diagnosis not present

## 2016-07-17 DIAGNOSIS — Z96652 Presence of left artificial knee joint: Secondary | ICD-10-CM | POA: Diagnosis not present

## 2016-08-15 ENCOUNTER — Telehealth: Payer: Self-pay | Admitting: Pulmonary Disease

## 2016-08-15 NOTE — Telephone Encounter (Signed)
SN  Please Advise-   This pt. Has a physical scheduled with you on Monday and he wanted to know if we could go ahead an place orders for his labs, so he can get them drawn tomorrow morning 08/16/16

## 2016-08-16 ENCOUNTER — Other Ambulatory Visit (INDEPENDENT_AMBULATORY_CARE_PROVIDER_SITE_OTHER): Payer: 59

## 2016-08-16 DIAGNOSIS — R972 Elevated prostate specific antigen [PSA]: Secondary | ICD-10-CM | POA: Diagnosis not present

## 2016-08-16 DIAGNOSIS — R7989 Other specified abnormal findings of blood chemistry: Secondary | ICD-10-CM

## 2016-08-16 DIAGNOSIS — E669 Obesity, unspecified: Secondary | ICD-10-CM | POA: Diagnosis not present

## 2016-08-16 DIAGNOSIS — I10 Essential (primary) hypertension: Secondary | ICD-10-CM

## 2016-08-16 DIAGNOSIS — E1169 Type 2 diabetes mellitus with other specified complication: Secondary | ICD-10-CM | POA: Diagnosis not present

## 2016-08-16 DIAGNOSIS — E785 Hyperlipidemia, unspecified: Secondary | ICD-10-CM | POA: Diagnosis not present

## 2016-08-16 LAB — LIPID PANEL
CHOL/HDL RATIO: 4
CHOLESTEROL: 166 mg/dL (ref 0–200)
HDL: 44 mg/dL (ref >39.00)
NONHDL: 122.03
TRIGLYCERIDES: 205 mg/dL — AB (ref 0.0–149.0)
VLDL: 41 mg/dL — ABNORMAL HIGH (ref 0.0–40.0)

## 2016-08-16 LAB — BASIC METABOLIC PANEL
BUN: 12 mg/dL (ref 6–23)
CHLORIDE: 99 meq/L (ref 96–112)
CO2: 32 meq/L (ref 19–32)
CREATININE: 0.82 mg/dL (ref 0.40–1.50)
Calcium: 9.6 mg/dL (ref 8.4–10.5)
GFR: 99.32 mL/min (ref >60.00)
Glucose, Bld: 233 mg/dL — ABNORMAL HIGH (ref 70–99)
Potassium: 4.1 mEq/L (ref 3.5–5.1)
Sodium: 138 mEq/L (ref 135–145)

## 2016-08-16 LAB — PSA: PSA: 4.51 ng/mL — AB (ref 0.10–4.00)

## 2016-08-16 LAB — LDL CHOLESTEROL, DIRECT: Direct LDL: 89 mg/dL

## 2016-08-16 LAB — HEMOGLOBIN A1C: Hgb A1c MFr Bld: 9.9 % — ABNORMAL HIGH (ref 4.6–6.5)

## 2016-08-16 NOTE — Telephone Encounter (Signed)
Labs have already been placed. Nothing further is needed.

## 2016-08-19 ENCOUNTER — Ambulatory Visit (INDEPENDENT_AMBULATORY_CARE_PROVIDER_SITE_OTHER): Payer: 59 | Admitting: Pulmonary Disease

## 2016-08-19 VITALS — BP 154/72 | HR 71 | Temp 98.0°F

## 2016-08-19 DIAGNOSIS — E782 Mixed hyperlipidemia: Secondary | ICD-10-CM

## 2016-08-19 DIAGNOSIS — Z96612 Presence of left artificial shoulder joint: Secondary | ICD-10-CM

## 2016-08-19 DIAGNOSIS — E6609 Other obesity due to excess calories: Secondary | ICD-10-CM

## 2016-08-19 DIAGNOSIS — I10 Essential (primary) hypertension: Secondary | ICD-10-CM | POA: Diagnosis not present

## 2016-08-19 DIAGNOSIS — IMO0001 Reserved for inherently not codable concepts without codable children: Secondary | ICD-10-CM

## 2016-08-19 DIAGNOSIS — E669 Obesity, unspecified: Secondary | ICD-10-CM

## 2016-08-19 DIAGNOSIS — R911 Solitary pulmonary nodule: Secondary | ICD-10-CM | POA: Diagnosis not present

## 2016-08-19 DIAGNOSIS — I350 Nonrheumatic aortic (valve) stenosis: Secondary | ICD-10-CM

## 2016-08-19 DIAGNOSIS — Z86010 Personal history of colonic polyps: Secondary | ICD-10-CM

## 2016-08-19 DIAGNOSIS — M159 Polyosteoarthritis, unspecified: Secondary | ICD-10-CM

## 2016-08-19 DIAGNOSIS — G4733 Obstructive sleep apnea (adult) (pediatric): Secondary | ICD-10-CM | POA: Diagnosis not present

## 2016-08-19 DIAGNOSIS — Z96652 Presence of left artificial knee joint: Secondary | ICD-10-CM

## 2016-08-19 DIAGNOSIS — R972 Elevated prostate specific antigen [PSA]: Secondary | ICD-10-CM

## 2016-08-19 DIAGNOSIS — M150 Primary generalized (osteo)arthritis: Secondary | ICD-10-CM

## 2016-08-19 DIAGNOSIS — E1169 Type 2 diabetes mellitus with other specified complication: Secondary | ICD-10-CM | POA: Diagnosis not present

## 2016-08-19 DIAGNOSIS — R931 Abnormal findings on diagnostic imaging of heart and coronary circulation: Secondary | ICD-10-CM

## 2016-08-19 DIAGNOSIS — Z6841 Body Mass Index (BMI) 40.0 and over, adult: Secondary | ICD-10-CM

## 2016-08-19 DIAGNOSIS — M8949 Other hypertrophic osteoarthropathy, multiple sites: Secondary | ICD-10-CM

## 2016-08-19 MED ORDER — LIRAGLUTIDE 18 MG/3ML ~~LOC~~ SOPN: 1.8000 mg | PEN_INJECTOR | Freq: Every day | SUBCUTANEOUS | 3 refills | Status: DC

## 2016-08-19 MED FILL — VICTOZA 18 MG/3 ML INJECT P: 18 | 90 days supply | Qty: 27 | Fill #0

## 2016-08-19 NOTE — Patient Instructions (Signed)
Today we updated your med list in our EPIC system...    Continue your current medications the same...  We decided to change the Onglyza to VICTOZA 1.8 injected once daily...    Continue the METFORMIN 500mg  tabs- 2tabs twice daily...    Let's get on track w/ diet 7 continue the great job w/ exercise..    We will arrange for an appt w/ DM/Endocrine for their input...  We will set up a UROLOGY consultation w/ DrBorden...  We will request a new ResMed S10 CPAP machine from [facility]...  Later, and at your convenience, you should set up a colonoscopy w/ DrGessner...  Call for any questions or if I can be of service in any way.Marland KitchenMarland Kitchen

## 2016-08-20 ENCOUNTER — Encounter: Payer: Self-pay | Admitting: Pulmonary Disease

## 2016-08-20 NOTE — Progress Notes (Addendum)
Subjective:     Patient ID: Rodney Hayes, male   DOB: Jul 09, 1951, 66 y.o.   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   I have reviewed the extensive old records in [place] and establish the following problem list and DATA tabulation>>   DATA:   ~  2DEcho 09/03/11 showed mild LVH, mild focal asal hypertrophy of septum, norm LVF w/ EF=60-65%, norm wall motion, Gr1DD, mild calcif of AoV leaflets- no AS, trivAI, MV- wnl, mild LAdil, norm PAsys...  ~  CXR 01/06/13 showed heart at upper lim of norm, min peribronch thickening otherw clear lungs, NAD, Tspine osteophytes...  ~  EKG 02/07/14 showed NSR w/ occ PVCs, poor R prog V1-3, 1st degree AVB...  ~  Cardiac CT 04/01/14 showed very high calcium score=1847; norm Ao root, AoV trileaflet, Lmain=ok, LAD plaque worst in mid-vessel ~70%, 1st diag ~50%, midRCA is heavily calcif but can't eval stenosis due to motion; CATH was rec- but pt declined... Additional findings: 65m RLL nodule medially,  ~  Exercise Treadmill Test 04/2014> 655m, 7Mets, HR= 73-139 (89%max target), +HBP response, no CP, no EKG changes... ~  CT Chest 10/11/14 showed coronary calcif, sm nodes, 16m616mLL nodule (it measured 6mm516m2015) & airway plugging in RLL, min linear scarring RUL, left shoulder arthritis & thoracic spondylosis,   PROBLEM LIST:     OSA on CPAP> cannot find prev sleep study in EPIC ([facility]), he is on CPAP 12, tolerates well & keeps up w/ mask/ tubing changes Q6mo,25moellent compliance/ rests well/ no daytime sleepiness issues...     Pulmonary nodule on CT Chest, ex-smoker, Hx +PPD, remote hx asthma/ alergies as child> Ex-smoker approx 40pk-yrs & quit 1994; has not required resp meds; CT Chest 9/15 & 3/16 as above (16mmRL58module) f/u due 3/17; there is a report  of a pos PPD in the past & not treated w/ INH...    HBP, Heart murmur at base> on Amlod10, Cardura2Bid, Losar100, HCT25; BP= 122/76 today, usually sl higher at home; 2DEcho w/ mild LVH & ETT w/ hypertensive response; he declined to take BBlocker rec by DrHopper 8/16; denies CP, palpit, SOB, etc...     Coronary calcium score >400... on ASA81; he has +FamHx CAD w/ CABG in father, coronary calcif seen on CTChest & a very high coronary calcium score on CardiacCT ([id number] rec to have CATH but declined- he does not have CP & is very active w/ exercise program; neg ETT 10/15;  I too feel that he should have diagnostic cath w/ his mult coronary risk factors & we discussed this, but he respectfully declined...    Venous insuffic> trophic changes of VI on exam, hx intermittent edema on HCT25 daily & wears support hose...    Hx hyperlipidemia> remote labs showed TG up to 372 in past, TChol & LDL have always been good;  He [facility]OL [facility]  FLP 2/16 showed TChol 101, TG 104, HDL 38, LDL 43 on diet alone; FLP 1/17 showed TChol 147, TG 192, HDL 39, LDL 70;  He knows that low fat/ low carb diet 7 wt loss will bring TG back to normal...    DM2> on Metform500-2Bid, Onglyza5; Urine microalb & renal function  normal; no retinopathy; recent accuchecks= 170-280 at home; POC A1c values reports >8.0; Labs 07/2015 showed FBS=183, A1c=9.6; we discussed additional meds but he admits to poor diet over the last 59mo& wants 365521morial of vigorous diet/ continued exercise/ and weight reduction w/ repeat labs...    Obesity> despite his best efforts weight has remained ~300# & we reviewed diet/ exercise/ wt reduction strategies; strive for 20# increments...     Hx colon polyps, divertics> last colonoscopy 06/2009 by DrGessner showed 53m68molyp in mid-transverse colon (adenomatous), mild divertics; he is overdue for f/u colon & will contact GI at his convenience...     Hx HepB infection> from needle stick exposure  1978 (renal Tx pt), treated conservatively & resolved, LFTs have all been wnl since then...     DJD, s/p L-TKR, s/p L-shoulder replacement> followed by DrAlusio & DrNorris...  EXAM shows Afeb, VSS, O2sat=94% on RA;  HEENT- neg, mallampati3;  Chest- clear w/o w/r/r;  Heart- RR gr1-2/6 SEM at base w/o r/g;  Abd- obese,soft, nontender;  Ext- VI changes w/o c/c/e;  Neuro- intact...  LABS 07/2015>  FLP- ok on diet alone w/ good Chol but TG up to 192 (needs better diet);  Chems ok x BS=183, A1c=9.6 (needs additional meds);  CBC, TSH= wnl;  PSA=3.82 representing incr PSA velocity & rec recheck in 50mo65moDEcho 07/2015>  Done 08/17/15> norm LVF w/ EF=55-60%, no regional wall motion abn, norm diastolic function, MILD AS (mildly thickened & calcif leaflets- mean gradient=10, peak=21), norm RV but PAsys ~32mm56m. IMP/PLAN>>  Rodney Hayes to establish with us, pKoreav PCP= DrHopper, f/u labs and 2DEcho are pending;  Concerns include weight, BP, coronary art dis and mult coronary risk factors;  He has an incidental 7mm R80mnodule seen on prev CT & f/u due 09/2015;  We discussed TG, BS/A1c, & PSA as above- he is committed to low fat/ low carb diet & wt reduction w/ recheck FLP, BS/A1c in 521mo; h8moll need a f/u PSA in 50mo;  F5521mow up screening colon is overdue as well & he will contact DrGessner at his earliest convenience...  ADDENDUM>>  CT Chest 11/20/15 showed stable 6mm nodu35min RLL no change over 65mo inte15mo no new pulm nodules, no adenopathy, +coronary calcif as before, no change in diffuse distal esoph wall thickening...  ADDENDUM>>  Pt did not return for planned f/u labs in April2017;  I received note from [facility] that pt wanted to change his Metform500-2Bid +Onglyza5/d regimen back to KombiglyzeBriarcliffone tab Qam because he thought it was more effective for him... I sent a note to Pt requesting f/u FASTING blood work before making a decision about decreasing his med dose vs poss  referral to Endocrine/ DM specialist...  >>[name]-- I received a note from the Link to Wellness f[place] Valley[place] Metform500DeWittto return to KombiglyzeCrest1 tab daily (this is obviously 1/2 of your current dose);  I've attached a copy of our 07/2015 CPX- & note that Wt was 303#, BS=183, A1c=9.6 and the plan was to recheck the fasting blood work in 521mo on the91mos + vigorous diet/ exercise effort-- we are clearly overdue. & in addition we wanted to recheck the PSA at 50mo since i68mos 3.82 in Jan2017...   The Link to Wellness people have been seeing you every 521mo & includ40mohis data>>  09/2015>  Wt=292#     01/2016>  Wt=301#     05/2016>  Wt=306#  How about we have you come by the office, or [facility] one morning at your convenience for f/u FASTING blood work before we make any changes...   ~  August 19, 2016:  1year ROV & medical follow up visit>  Rodney Hayes returns for a yearly CPX feeling well, no complaints or concerns; we reviewed the following medical problems during today's office visit >>     OSA on CPAP> cannot find prev sleep study in EPIC (ordered by [facility], read by DrClance), he is on CPAP 12, tolerates well & keeps up w/ mask/ tubing changes Q61mo he reports excellent compliance (can't sleep w/o it)/ rests well/ no daytime sleepiness issues; his machine is now 68yrold & we will request a new Res-Med S10 machine [facility] not [facility] ramp feature)...    Pulmonary nodule on CT Chest, ex-smoker, Hx +PPD, remote hx asthma/ alergies as child> Ex-smoker approx 40pk-yrs & quit 1994; has not required resp meds; CT Chest 9/15, 3/16, & 5/17 as above (6-24m724mL nodule w/o change); there is a report of a pos PPD in the past & not treated w/ INH; he denies cough, sput, hemoptysis, SOB, CP, etc...    HBP, Heart murmur at base=> mild AS on 2D 2/17> on Amlod10, Cardura2Bid, Losar100, HCT25; BP= 154/72 today, similar at home; prev 2DEcho w/ mild LVH & ETT w/ hypertensive  response; he declined to take BBlocker rec by DrHopper 8/16; denies CP, palpit, SOB, edemaq, etc; 2DEcho 2/17 showed norm EF, no RWMA, but mild AS...     Coronary calcium score >400... on ASA81; he has +FamHx CAD w/ CABG in father, coronary calcif seen on CTChest & a very high coronary calcium score on CardiacCT (18(7353he was rec to have CATH but declined- he does not have CP & is very active w/ exercise program; neg ETT 10/15;  I too feel that he should have diagnostic cath w/ his mult coronary risk factors & we discussed this, but he respectfully declined again...    Venous insuffic> trophic changes of VI on exam, hx intermittent edema on HCT25 daily & wears support hose...    Hx hyperlipidemia> remote labs showed TG up to 372 in past, TChol & LDL have always been good;  [facility];  FLP 2/18 on diet alone showed TChol 166, TG 205, HDL 44, LDL 89;  He knows that low fat/ low carb diet & wt loss will bring TG back to normal...    DM2> on Metform500-2Bid, Onglyza5; Urine microalb & renal function normal; no retinopathy; recent accuchecks= 170-280 at home; POC A1c values reports >8.0; Labs 2/18 showed FBS=233, A1c=9.9; we discussed additional meds vs referral to Endocrine- DrGherghe; we decided to change Onglyza to VICMount Horeb8/d & set up f/u appt w/ Endocrine.    Obesity> despite his best efforts weight has remained ~300# & we reviewed diet/ exercise/ wt reduction strategies; strive for 20# increments...     Hx colon polyps, divertics> last colonoscopy 06/2009 by DrGessner showed 4mm73mlyp in mid-transverse colon (adenomatous), mild divertics; he is overdue for f/u colon & will contact GI at his convenience...     Hx HepB infection> from needle stick exposure 1978 (renal Tx pt), treated conservatively & resolved, LFTs have all been wnl since then...     Elevated PSA>  Labs 1/17 showed PSA=3.82 & he was asked to recheck in 29mo;429moA repeated 2/18 = 4.51 & we will refer  to  Urology for their exam & follow up...    DJD, s/p L-TKR, s/p L-shoulder replacement> followed by DrAlusio & DrNorris... EXAM shows Afeb, VSS, O2sat=95% on RA;  HEENT- neg, mallampati3;  Chest- clear w/o w/r/r;  Heart- RR gr1-2/6 SEM at base (AS) w/o r/g;  Abd- obese,soft, nontender;  Ext- VI changes w/o c/c/e;  Neuro- intact...  LABS 08/16/16>  FLP- ok x TG=202;  Chems- ok x BS=233, A1c=9.9;  PSA=4.51... IMP/PLAN>>  The above is reviewed w/ Rodney Hayes-- OSA & we will request a new ResMed S10 machine for him thru [facility];  Hi coronary calcium score, coronary art calc on CTs, & he's been rec to have cath but declines (he remains very active, exercising, etc but has mult coronary risk factors);  Chol is ok on diet but he needs better low fat diet & wt reduction for the TG;  DM control is poor & he has resisted med changes or consideration of insulin- he agrees to change Onglyza to [facility] 1.8/d & will establish w/ DM/Endocrine DrGherghe;  F/u colonoscopy is overdue w/ DrGessner & he will set this up at his convenience;  PSA is elev at 4.51 (slow steady increase) & we will refer to Urology for their analysis...   ADDENDUM>> Pt received new CPAP machine> ResMed S10, Air/ Auto, set CPAP 14cmH2O-- CPAP Download from 2/21 - 10/03/16 showed excellent compliance on his machine w/ CPAP 14cmH2O:  He used the machine 28/30 days (93%), ave 8H/ night, min air leak, and AHI~1/hr... rec to continue same settings and keep current on masks, tubing, etc... call for any issues...    Past Medical History:  Diagnosis Date  . Arthritis   . Asthma   . BPH (benign prostatic hyperplasia)   . Diabetes mellitus    average fasting 140s  . DJD (degenerative joint disease)   . Heart murmur    [name]  . Hyperlipidemia   . Hypertension   . Nonspecific abnormal electrocardiogram (ECG) (EKG)   . Obesity   . Shortness of breath dyspnea    exertion  . Sleep apnea    C-PAP    Past Surgical History:  Procedure Laterality Date   . Biceps tendon reinsertion    . colonoscopy with polypectomy     adenomatous; [name]  . JOINT REPLACEMENT Left    knee  . left knee arthroscopy    . TONSILLECTOMY AND ADENOIDECTOMY    . TOTAL KNEE ARTHROPLASTY Left 01/11/2013   Procedure: LEFT TOTAL KNEE ARTHROPLASTY;  Surgeon: [name], MD;  Location: [facility];  Service: Orthopedics;  Laterality: Left;  . TOTAL SHOULDER ARTHROPLASTY Left 08/12/2014   Procedure: LEFT TOTAL SHOULDER ARTHROPLASTY;  Surgeon: [name], MD;  Location: [facility];  Service: Orthopedics;  Laterality: Left;  . UMBILICAL HERNIA REPAIR      Outpatient Encounter Prescriptions as of 08/04/2015  Medication Sig  . amLODipine (NORVASC) 10 MG tablet Take 1 tablet (10 mg total) by mouth every morning. --need to establish care with new PCP, call our office for appt.  Marland Kitchen aspirin EC 81 MG tablet Take 1 tablet (81 mg total) by mouth daily.  Marland Kitchen doxazosin (CARDURA) 2 MG tablet Take 1 tablet (2 mg total) by mouth 2 (two) times daily.  . Glucosamine HCl (GLUCOSAMINE PO) Take 2 tablets by mouth daily.  . Glucose Blood (TRUE METRIX BLOOD GLUCOSE TEST VI) Use to test glucose once daily  . hydrochlorothiazide (HYDRODIURIL) 25 MG tablet Take 1 tablet (25 mg total)  by mouth daily.  Marland Kitchen losartan (COZAAR) 100 MG tablet Take 1 tablet (100 mg total) by mouth daily.  . metFORMIN (GLUCOPHAGE) 500 MG tablet 2 pills bid after 2 largest meals  . Multiple Vitamins-Minerals (MULTIVITAMIN PO) Take 1 tablet by mouth daily.  . saxagliptin HCl (ONGLYZA) 5 MG TABS tablet Take 1 tablet (5 mg total) by mouth daily.  . carvedilol (COREG) 3.125 MG tablet Take 1 tablet (3.125 mg total) by mouth 2 (two) times daily with a meal. (Patient not taking: Reported on 05/31/2015)    Allergies  Allergen Reactions  . Cymbalta [Duloxetine Hcl]     As per e-mail 12/13/11: Insomnia, lethargy, inability to ejaculate  . Tetracycline     REACTION: PHOTOSENSITIVITY  He is also sensitive to STATIN meds w/ severe  arthralgias...    Immunization History  Administered Date(s) Administered  . Influenza Whole 05/15/2012, 04/14/2013  . Influenza,inj,Quad PF,36+ Mos 04/18/2016  . Influenza-Unspecified 04/15/2015  . Tdap 07/15/2008    Family History  Problem Relation Age of Onset  . Lung cancer Mother     smoker  . Diabetes Father   . Coronary artery disease Father     CBAG in late 55s  . Stroke Neg Hx   Father had HBP, DM, CAD-s/pCABG, prostate ca... Mother had DM, nonsmall cell lung cancer   Social History   Social History  . Marital status: Married    Spouse name: N/A  . Number of children: N/A  . Years of education: N/A   Occupational History  . Not on file.   Social History Main Topics  . Smoking status: Former Smoker    Packs/day: 2.00    Years: 30.00    Types: Cigarettes    Start date: 07/15/1962    Quit date: 08/15/1992  . Smokeless tobacco: Never Used     Comment: smoked 14 -42 up to 2 ppd  . Alcohol use 2.4 oz/week    4 Glasses of wine per week     Comment: Red Wine  . Drug use: No  . Sexual activity: Not on file   Other Topics Concern  . Not on file   Social History Narrative  . No narrative on file    Review of Systems             All symptoms NEG except where BOLDED >>  Constitutional:  F/C/S, fatigue, anorexia, unexpected weight change. HEENT:  HA, visual changes, hearing loss, earache, nasal symptoms, sore throat, mouth sores, hoarseness. Resp:  cough, sputum, hemoptysis; SOB, tightness, wheezing. Cardio:  CP, palpit, DOE, orthopnea, edema (intermittently) GI:  N/V/D/C, blood in stool; reflux, abd pain, distention, gas. GU:  dysuria, freq, urgency, hematuria, flank pain, voiding difficulty. MS:  joint pain, swelling, tenderness, decr ROM; neck pain, back pain, etc. Neuro:  HA, tremors, seizures, dizziness, syncope, weakness, numbness, gait abn. Skin:  suspicious lesions or skin rash. Heme:  adenopathy, bruising, bleeding. Psyche:  confusion, agitation,  sleep disturbance (OSA), hallucinations, anxiety, depression suicidal.   Objective:   Physical Exam       Vital Signs:  Reviewed...   General:  WD, obese, 66 y/o WM in NAD; alert & oriented; pleasant & cooperative... HEENT:  [place]/AT; Conjunctiva- pink, Sclera- nonicteric, EOM-wnl, PERRLA, EACs-clear, TMs-wnl; NOSE-clear; THROAT-clear & wnl.  Neck:  Supple w/ fair ROM; no JVD; normal carotid impulses w/o bruits; no thyromegaly or nodules palpated; no lymphadenopathy.  Chest:  Clear to P & A; without wheezes, rales, or rhonchi heard. Heart:  Regular  Rhythm; norm S1 & S2, Gr1-2/6 SEM at base, no rubs or gallops detected... Abdomen:  Obese, soft & nontender- no guarding or rebound; normal bowel sounds; no organomegaly or masses palpated. Ext:  +arthritic changes w/ L-TKR & L-shoulder replacement; +venous insuffic changes, no VV or edema;  Pulses intact w/o bruits. Neuro:  CNs II-XII intact; motor testing normal; sensory testing normal; gait normal & balance OK. Derm:  No lesions noted; no rash etc. Lymph:  No cervical, supraclavicular, axillary, or inguinal adenopathy palpated.   Assessment:         OSA on CPAP> on CPAP 12(?) but prev PSG not avail & no paperwork/ download data avail in Epic; he is doing well w/ current settings & keeps up w/ mask & tubing changes, etc...    Pulmonary nodule on CT Chest, ex-smoker, Hx +PPD, remote hx asthma/ alergies as child> not requiring pulm meds; f/u CTChest is due 09/2015...    HBP, Heart murmur at base> on Amlod10, Cardura2Bid, Losar100, HCT25; repeat 2DEcho=> pending    Coronary calcium score >400... on ASA81; he has +FamHx CAD w/ CABG in father, coronary calcif seen on CTChest & a very high coronary calcium score on CardiacCT (6967); he was rec to have CATH but declined- he does not have CP & is very active w/ exercise program; neg ETT 10/15...    Venous insuffic> aware- on HCT25, low sodium, wears support hose, etc...    Hx hyperlipidemia> on diet  alone; Cards wanted him on Statin but he was intol to Lip20 w/ joint pain; Labs 07/2015 confirms good Chol values but TG=192 off diet (this will ret to norm on low fat wt reducing diet).    DM2> on Metform500-2Bid, Onglyza5; Labs 07/2015 showed FBS=183, A1c=9,6 & he admits to poor diet recently & is committed to low carb diet 7 wt reduction now=> plan f/u labs in 80mo..    Obesity> despite intermittently losing some weight, he has not been able to keep it off; we reviewed diet, exercise, weight reduction strategies including bariatric options...    Hx colon polyps, divertics> followed by DRance Muir he is overdue for f/u colon & will set this up at his earliest convenience...    Hx HepB infection> from needle stick exposure 1978 (renal Tx pt), treated conservatively & resolved, LFTs have all been wnl since then...     Elev PSA>  PSA up to 4.51 [id number]& he is referred to Urology...    DJD, s/p L-TKR, s/p L-shoulder replacement> followed by DrAlusio & DrNorris...     Plan:     Patient's Medications  New Prescriptions   LIRAGLUTIDE (VICTOZA) 18 MG/3ML SOPN    Inject 0.3 mLs (1.8 mg total) into the skin daily.  Previous Medications   AMLODIPINE (NORVASC) 10 MG TABLET    Take 1 tablet (10 mg total) by mouth every morning.   ASPIRIN EC 81 MG TABLET    Take 1 tablet (81 mg total) by mouth daily.   CHOLECALCIFEROL (VITAMIN D) 1000 UNITS TABLET    Take 1,000 Units by mouth daily.   DOXAZOSIN (CARDURA) 2 MG TABLET    Take 1 tablet (2 mg total) by mouth 2 (two) times daily.   GLUCOSE BLOOD (TRUE METRIX BLOOD GLUCOSE TEST VI)    Use to test glucose once daily   HYDROCHLOROTHIAZIDE (HYDRODIURIL) 25 MG TABLET    Take 1 tablet (25 mg total) by mouth daily.   LOSARTAN (COZAAR) 100 MG TABLET    Take 1 tablet (100  mg total) by mouth daily.   METFORMIN (GLUCOPHAGE) 500 MG TABLET    2 pills bid after 2 largest meals   MULTIPLE VITAMINS-MINERALS (MULTIVITAMIN PO)    Take 1 tablet by mouth daily. Reported on  01/25/2016  Modified Medications   No medications on file  Discontinued Medications   GLUCOSAMINE HCL (GLUCOSAMINE PO)    Take 2 tablets by mouth daily.   SAXAGLIPTIN HCL (ONGLYZA) 5 MG TABS TABLET    Take 1 tablet (5 mg total) by mouth daily.

## 2016-08-21 DIAGNOSIS — M9901 Segmental and somatic dysfunction of cervical region: Secondary | ICD-10-CM | POA: Diagnosis not present

## 2016-08-21 DIAGNOSIS — M9902 Segmental and somatic dysfunction of thoracic region: Secondary | ICD-10-CM | POA: Diagnosis not present

## 2016-08-21 DIAGNOSIS — M19071 Primary osteoarthritis, right ankle and foot: Secondary | ICD-10-CM | POA: Diagnosis not present

## 2016-08-21 DIAGNOSIS — Z96612 Presence of left artificial shoulder joint: Secondary | ICD-10-CM | POA: Diagnosis not present

## 2016-08-21 DIAGNOSIS — M9906 Segmental and somatic dysfunction of lower extremity: Secondary | ICD-10-CM | POA: Diagnosis not present

## 2016-08-21 DIAGNOSIS — M25612 Stiffness of left shoulder, not elsewhere classified: Secondary | ICD-10-CM | POA: Diagnosis not present

## 2016-08-21 DIAGNOSIS — Z96652 Presence of left artificial knee joint: Secondary | ICD-10-CM | POA: Diagnosis not present

## 2016-08-21 DIAGNOSIS — M50223 Other cervical disc displacement at C6-C7 level: Secondary | ICD-10-CM | POA: Diagnosis not present

## 2016-08-21 DIAGNOSIS — M791 Myalgia: Secondary | ICD-10-CM | POA: Diagnosis not present

## 2016-08-21 MED FILL — UNIFINE PENTIPS 31GX3/16": 31G X 5 MM | 90 days supply | Qty: 100 | Fill #0

## 2016-08-21 MED FILL — UNIFINE PENTIPS 31GX3/16: 31G X 5 MM | 90 days supply | Qty: 100 | Fill #0

## 2016-09-04 DIAGNOSIS — G4733 Obstructive sleep apnea (adult) (pediatric): Secondary | ICD-10-CM | POA: Diagnosis not present

## 2016-09-16 ENCOUNTER — Other Ambulatory Visit: Payer: Self-pay | Admitting: Pulmonary Disease

## 2016-09-16 MED FILL — LOSARTAN POTASSIUM 100 MG T: 100 | 90 days supply | Qty: 90 | Fill #0

## 2016-09-16 MED FILL — metFORMIN HCL 500 MG TABS: 500 | 90 days supply | Qty: 360 | Fill #0

## 2016-09-18 DIAGNOSIS — Z96612 Presence of left artificial shoulder joint: Secondary | ICD-10-CM | POA: Diagnosis not present

## 2016-09-18 DIAGNOSIS — M19071 Primary osteoarthritis, right ankle and foot: Secondary | ICD-10-CM | POA: Diagnosis not present

## 2016-09-18 DIAGNOSIS — Z96652 Presence of left artificial knee joint: Secondary | ICD-10-CM | POA: Diagnosis not present

## 2016-09-18 DIAGNOSIS — M9902 Segmental and somatic dysfunction of thoracic region: Secondary | ICD-10-CM | POA: Diagnosis not present

## 2016-09-18 DIAGNOSIS — M25612 Stiffness of left shoulder, not elsewhere classified: Secondary | ICD-10-CM | POA: Diagnosis not present

## 2016-09-18 DIAGNOSIS — M9901 Segmental and somatic dysfunction of cervical region: Secondary | ICD-10-CM | POA: Diagnosis not present

## 2016-09-18 DIAGNOSIS — M791 Myalgia: Secondary | ICD-10-CM | POA: Diagnosis not present

## 2016-09-18 DIAGNOSIS — M9906 Segmental and somatic dysfunction of lower extremity: Secondary | ICD-10-CM | POA: Diagnosis not present

## 2016-09-18 DIAGNOSIS — M50223 Other cervical disc displacement at C6-C7 level: Secondary | ICD-10-CM | POA: Diagnosis not present

## 2016-09-27 ENCOUNTER — Other Ambulatory Visit: Payer: Self-pay | Admitting: Pulmonary Disease

## 2016-09-27 DIAGNOSIS — I10 Essential (primary) hypertension: Secondary | ICD-10-CM

## 2016-09-27 MED ORDER — DOXAZOSIN MESYLATE 2 MG PO TABS: 2.0000 mg | ORAL_TABLET | Freq: Two times a day (BID) | ORAL | 3 refills | Status: DC

## 2016-09-27 MED FILL — DOXAZOSIN MESYLATE 2 MG TAB: 2 | 90 days supply | Qty: 180 | Fill #0

## 2016-10-02 ENCOUNTER — Telehealth: Payer: Self-pay | Admitting: Pulmonary Disease

## 2016-10-02 ENCOUNTER — Other Ambulatory Visit: Payer: Self-pay | Admitting: Pulmonary Disease

## 2016-10-02 DIAGNOSIS — G4733 Obstructive sleep apnea (adult) (pediatric): Secondary | ICD-10-CM | POA: Diagnosis not present

## 2016-10-02 MED ORDER — AMLODIPINE BESYLATE 10 MG PO TABS: 10.0000 mg | ORAL_TABLET | Freq: Every morning | ORAL | 3 refills | Status: DC

## 2016-10-02 MED ORDER — HYDROCHLOROTHIAZIDE 25 MG PO TABS: 25.0000 mg | ORAL_TABLET | Freq: Every day | ORAL | 3 refills | Status: DC

## 2016-10-02 MED FILL — HYDROCHLOROTHIAZIDE 25 MG T: 25 | 90 days supply | Qty: 90 | Fill #0

## 2016-10-02 MED FILL — AMLODIPINE BESYLATE 10 MG T: 10 | 90 days supply | Qty: 90 | Fill #0

## 2016-10-02 NOTE — Telephone Encounter (Signed)
Received call from pt. Pt is requesting refill of HCTZ and amlodipine 10mg , these have been sent to the preferred pharmacy. Pt also requested a refill of the Onglyza, however at the last OV with SN - he was changed to Victoza and there are additional refills of this. Nothing further needed at this time.

## 2016-10-03 ENCOUNTER — Encounter: Payer: Self-pay | Admitting: Pulmonary Disease

## 2016-10-03 MED FILL — ONGLYZA 5 MG TABLET: 5 | 90 days supply | Qty: 90 | Fill #0

## 2016-10-04 ENCOUNTER — Encounter: Payer: Self-pay | Admitting: Internal Medicine

## 2016-10-04 ENCOUNTER — Ambulatory Visit (INDEPENDENT_AMBULATORY_CARE_PROVIDER_SITE_OTHER): Payer: 59 | Admitting: Internal Medicine

## 2016-10-04 VITALS — BP 144/82 | HR 75 | Ht 70.0 in | Wt 311.0 lb

## 2016-10-04 DIAGNOSIS — E1165 Type 2 diabetes mellitus with hyperglycemia: Secondary | ICD-10-CM | POA: Diagnosis not present

## 2016-10-04 DIAGNOSIS — E119 Type 2 diabetes mellitus without complications: Secondary | ICD-10-CM | POA: Insufficient documentation

## 2016-10-04 MED ORDER — CANAGLIFLOZIN 100 MG PO TABS: 100.0000 mg | ORAL_TABLET | Freq: Every day | ORAL | 5 refills | Status: DC

## 2016-10-04 MED FILL — INVOKANA 100 MG TABLET: 100 | 30 days supply | Qty: 30 | Fill #0

## 2016-10-04 NOTE — Progress Notes (Signed)
Patient ID: Rodney Hayes, male   DOB: [birth year], 66 y.o.   MRN: [id number]   HPI: Rodney Hayes is a 66 y.o.-year-old male, referred by his PCP, Dr. Lenna Hayes, for management of DM2, dx in 2011, non-insulin-dependent, uncontrolled, without long term complications.  Last hemoglobin A1c was: Lab Results  Component Value Date   HGBA1C 9.9 (H) 08/16/2016   HGBA1C 9.6 (H) 08/09/2015   HGBA1C 7.1 (H) 08/25/2014  He was off his DM meds >> 1 year ago >> started back recently.  Pt is on a regimen of: - Metformin 1000 mg 2x a day, with meals - Onglyza 5 mg daily in am - Victoza 1.8 mg daily in am - started 1 mo ago Previously on Kombiglyze 2.11-998 mg daily   Pt checks his sugars 1x a day and they are better after he started Victoza: - am: 149-200, 222 - 2h after b'fast: n/c - before lunch: n/c - 2h after lunch: n/c - before dinner: n/c - 2h after dinner: n/c - bedtime: n/c - nighttime: n/c No lows. Lowest sugar was 124; he has hypoglycemia awareness at 70.  Highest sugar was 330.  Glucometer: AccuChek  Pt's meals are: - Breakfast:  Bacon, 2 bisquits, fruit - Lunch: hamburger, fries - Dinner: chicken, salad - Snacks: 4, nuts, candy, cottage cheese He tells me that he knows that he is not taking too much, especially in the evening. He has been on a mostly vegetarian diet before, after he read Dr. Dimas Hayes "Eat to live" book. However, he is having a hard time applying the principles to his everyday life.  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 - last set of lipids: Lab Results  Component Value Date   CHOL 166 08/16/2016   HDL 44.00 08/16/2016   LDLCALC 70 08/09/2015   LDLDIRECT 89.0 08/16/2016   TRIG 205.0 (H) 08/16/2016    CHOLHDL 4 08/16/2016  Statin >> joint pain. - last eye exam was in 05/2016. No DR. Has a cataract. - no numbness and tingling in his feet.  Pt has FH of DM in father.  He also has a history of HTN, OSA, elevated PSA.  ROS: Constitutional: + weight gain, no fatigue, no subjective hyperthermia/hypothermia, + Nocturia Eyes: no blurry vision, no xerophthalmia ENT: no sore throat, no nodules palpated in throat, no dysphagia/odynophagia, no hoarseness, + hypoacusis Cardiovascular: no CP/SOB/palpitations/leg swelling Respiratory: no cough/SOB Gastrointestinal: no N/V/D/C Musculoskeletal: + Both: muscle/joint aches Skin: no rashes, + hair loss Neurological: no tremors/numbness/tingling/dizziness Psychiatric: no depression/anxiety  Past Medical History:  Diagnosis Date  . Arthritis   . Asthma   . BPH (benign prostatic hyperplasia)   . Diabetes mellitus    average fasting 140s  . DJD (degenerative joint disease)   . Heart murmur    Rodney Hayes  . Hyperlipidemia   . Hypertension   . Nonspecific abnormal electrocardiogram (ECG) (EKG)   . Obesity   . Shortness of breath dyspnea    exertion  . Sleep apnea    C-PAP   Past Surgical History:  Procedure Laterality Date  . Biceps tendon reinsertion    . colonoscopy with polypectomy     adenomatous; Dr Rodney Hayes  .  JOINT REPLACEMENT Left    knee  . left knee arthroscopy    . TONSILLECTOMY AND ADENOIDECTOMY    . TOTAL KNEE ARTHROPLASTY Left 01/11/2013   Procedure: LEFT TOTAL KNEE ARTHROPLASTY;  Surgeon: Rodney Alf, MD;  Location: [facility];  Service: Orthopedics;  Laterality: Left;  . TOTAL SHOULDER ARTHROPLASTY Left 08/12/2014   Procedure: LEFT TOTAL SHOULDER ARTHROPLASTY;  Surgeon: Rodney Schooling, MD;  Location: [facility];  Service: Orthopedics;  Laterality: Left;  . UMBILICAL HERNIA REPAIR     Social History   Social History  . Marital status: Married    Spouse name: N/A  . Number of children: 2 - 58 -year-old son (2018)     Occupational History  . 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 Smoking status: Former Smoker    Packs/day: 2.00    Years: 30.00    Types: Cigarettes    Start date: 07/15/1962    Quit date: 08/15/1992  . Smokeless tobacco: Never Used     Comment: smoked 14 -42 up to 2 ppd  . Alcohol use 1-2 glasses    5x a week     Comment: Red Wine  . Drug use: No   Current Outpatient Prescriptions on File Prior to Visit  Medication Sig Dispense Refill  . amLODipine (NORVASC) 10 MG tablet Take 1 tablet (10 mg total) by mouth every morning. 90 tablet 3  . aspirin EC 81 MG tablet Take 1 tablet (81 mg total) by mouth daily. 90 tablet 3  . cholecalciferol (VITAMIN D) 1000 units tablet Take 1,000 Units by mouth daily.    Marland Kitchen doxazosin (CARDURA) 2 MG tablet Take 1 tablet (2 mg total) by mouth 2 (two) times daily. 180 tablet 3  . Glucose Blood (TRUE METRIX BLOOD GLUCOSE TEST VI) Use to test glucose once daily    . hydrochlorothiazide (HYDRODIURIL) 25 MG tablet Take 1 tablet (25 mg total) by mouth daily. 90 tablet 3  . liraglutide (VICTOZA) 18 MG/3ML SOPN Inject 0.3 mLs (1.8 mg total) into the skin daily. 3 pen 3  . losartan (COZAAR) 100 MG tablet Take 1 tablet (100 mg total) by mouth daily. 90 tablet 3  . metFORMIN (GLUCOPHAGE) 500 MG tablet TAKE 2 TABLETS BY MOUTH TWICE DAILY AFTER 2 LARGEST MEALS 360 tablet 3  . Multiple Vitamins-Minerals (MULTIVITAMIN PO) Take 1 tablet by mouth daily. Reported on 01/25/2016    . ONGLYZA 5 MG TABS tablet TAKE 1 TABLET BY MOUTH DAILY. 90 tablet 3   No current facility-administered medications on file prior to visit.    Allergies  Allergen Reactions  . Cymbalta [Duloxetine Hcl]     As per e-mail 12/13/11: Insomnia, lethargy, inability to ejaculate  . Tetracycline     REACTION: PHOTOSENSITIVITY   Family History  Problem Relation Age of Onset  . Lung cancer Mother     smoker  . Diabetes Father   . Coronary artery disease Father     CBAG in  late 32s  . Stroke Neg Hx    PE: BP (!) 144/82 (BP Location: Left Arm, Patient Position: Sitting)   Pulse 75   Ht 5\' 10"  (1.778 m)   Wt (!) 311 lb (141.1 kg)   SpO2 95%   BMI 44.62 kg/m  Wt Readings from Last 3 Encounters:  10/04/16 (!) 311 lb (141.1 kg)  05/16/16 (!) 306 lb (138.8 kg)  01/25/16 (!) 301 lb (136.5 kg)   Constitutional: obese,  in NAD Eyes: PERRLA, EOMI, no exophthalmos ENT: moist mucous membranes, no thyromegaly, no cervical lymphadenopathy Cardiovascular: RRR, No MRG, + B mild lower extremity edema Respiratory: CTA B Gastrointestinal: abdomen soft, NT, ND, BS+ Musculoskeletal: no deformities, strength intact in all 4 Skin: moist, warm, no rashes Neurological: no tremor with outstretched hands, DTR normal in all 4  ASSESSMENT: 1. DM2, non-insulin-dependent, uncontrolled, without long term complications, but with hyperglycemia  PLAN:  1. Patient with long-standing, uncontrolled diabetes, on oral antidiabetic regimen + recently added GLP-1 receptor agonist, with improvement in blood sugars in the last month. However, they are still above target. We did discuss about the fact that his diet is probably the main problem, and he is well aware of this. He has tried using a vegetarian diet but he feels unable to restart it and especially maintain it. We did discuss about switching at least a meal of day to a vegan/vegetarian meal and proceed from there. I also suggested some materials to help him make the transition. - Until he changes his diet, he needs more help with his diabetes control. I suggested a SGLT2 inhibitor and he agrees with this. INVOKANA appears to one covered by his insurance. We discussed about the [employer]vas study that showed an increased risk of amputations with Invokana. I pointed out the fact that pts that were more likely to require amputations were: men, with a HbA1c >8%, with previous amputations, with PN or PVD. He agrees to try the medication. - we  discussed about SEs of Invokana, which are: dizziness (advised to be careful when stands from sitting position), decreased BP - usually not < normal (BP today or as measured at home is not low), and fungal UTIs (advised to let me know if develops one).  - given discount card for Invokana - we also discussed about the new  weight loss center and I highly recommended that he sees Dr. Leafy Ro. He will let me know if he would like me to refer him to her. - I suggested to:  Patient Instructions  Please continue: - Metformin 1000 mg 2x a day with meals - Victoza 1.8 mg daily in am  Stop: - Onglyza  Start: - Invokana 100 mg daily in am  Look up Dr. Dennard Nip.  Please return in 1.5 months with your sugar log.   - Strongly advised him to start checking sugars at different times of the day - check 1-2 times a day, rotating checks - given sugar log and advised how to fill it and to bring it at next appt  - given foot care handout and explained the principles  - given instructions for hypoglycemia management "15-15 rule"  - advised for yearly eye exams  - Return to clinic in 1.5 mo with sugar log   Philemon Kingdom, MD PhD [facility]

## 2016-10-04 NOTE — Patient Instructions (Addendum)
Please continue: - Metformin 1000 mg 2x a day with meals - Victoza 1.8 mg daily in am  Stop: - Onglyza  Start: - Invokana 100 mg daily in am  Look up Dr. Dennard Nip.  Please return in 1.5 months with your sugar log.   PATIENT INSTRUCTIONS FOR TYPE 2 DIABETES:  **Please join MyChart!** - see attached instructions about how to join if you have not done so already.  DIET AND EXERCISE Diet and exercise is an important part of diabetic treatment.  We recommended aerobic exercise in the form of brisk walking (working between 40-60% of maximal aerobic capacity, similar to brisk walking) for 150 minutes per week (such as 30 minutes five days per week) along with 3 times per week performing 'resistance' training (using various gauge rubber tubes with handles) 5-10 exercises involving the major muscle groups (upper body, lower body and core) performing 10-15 repetitions (or near fatigue) each exercise. Start at half the above goal but build slowly to reach the above goals. If limited by weight, joint pain, or disability, we recommend daily walking in a swimming pool with water up to waist to reduce pressure from joints while allow for adequate exercise.    BLOOD GLUCOSES Monitoring your blood glucoses is important for continued management of your diabetes. Please check your blood glucoses 2-4 times a day: fasting, before meals and at bedtime (you can rotate these measurements - e.g. one day check before the 3 meals, the next day check before 2 of the meals and before bedtime, etc.).   HYPOGLYCEMIA (low blood sugar) Hypoglycemia is usually a reaction to not eating, exercising, or taking too much insulin/ other diabetes drugs.  Symptoms include tremors, sweating, hunger, confusion, headache, etc. Treat IMMEDIATELY with 15 grams of Carbs: . 4 glucose tablets .  cup regular juice/soda . 2 tablespoons raisins . 4 teaspoons sugar . 1 tablespoon honey Recheck blood glucose in 15 mins and repeat  above if still symptomatic/blood glucose <100.  RECOMMENDATIONS TO REDUCE YOUR RISK OF DIABETIC COMPLICATIONS: * Take your prescribed MEDICATION(S) * Follow a DIABETIC diet: Complex carbs, fiber rich foods, (monounsaturated and polyunsaturated) fats * AVOID saturated/trans fats, high fat foods, >2,300 mg salt per day. * EXERCISE at least 5 times a week for 30 minutes or preferably daily.  * DO NOT SMOKE OR DRINK more than 1 drink a day. * Check your FEET every day. Do not wear tightfitting shoes. Contact us if you develop an ulcer * See your EYE doctor once a year or more if needed * Get a FLU shot once a year * Get a PNEUMONIA vaccine once before and once after age 21 years  GOALS:  * Your Hemoglobin A1c of <7%  * fasting sugars need to be <130 * after meals sugars need to be <180 (2h after you start eating) * Your Systolic BP should be 182 or lower  * Your Diastolic BP should be 80 or lower  * Your HDL (Good Cholesterol) should be 40 or higher  * Your LDL (Bad Cholesterol) should be 100 or lower. * Your Triglycerides should be 150 or lower  * Your Urine microalbumin (kidney function) should be <30 * Your Body Mass Index should be 25 or lower    Please consider the following ways to cut down carbs and fat and increase fiber and micronutrients in your diet: - substitute whole grain for white bread or pasta - substitute brown rice for white rice - substitute 90-calorie flat bread pieces  for slices of bread when possible - substitute sweet potatoes or yams for white potatoes - substitute humus for margarine - substitute tofu for cheese when possible - substitute almond or rice milk for regular milk (would not drink soy milk daily due to concern for soy estrogen influence on breast cancer risk) - substitute dark chocolate for other sweets when possible - substitute water - can add lemon or orange slices for taste - for diet sodas (artificial sweeteners will trick your body that you  can eat sweets without getting calories and will lead you to overeating and weight gain in the long run) - do not skip breakfast or other meals (this will slow down the metabolism and will result in more weight gain over time)  - can try smoothies made from fruit and almond/rice milk in am instead of regular breakfast - can also try old-fashioned (not instant) oatmeal made with almond/rice milk in am - order the dressing on the side when eating salad at a restaurant (pour less than half of the dressing on the salad) - eat as little meat as possible - can try juicing, but should not forget that juicing will get rid of the fiber, so would alternate with eating raw veg./fruits or drinking smoothies - use as little oil as possible, even when using olive oil - can dress a salad with a mix of balsamic vinegar and lemon juice, for e.g. - use agave nectar, stevia sugar, or regular sugar rather than artificial sweateners - steam or broil/roast veggies  - snack on veggies/fruit/nuts (unsalted, preferably) when possible, rather than processed foods - reduce or eliminate aspartame in diet (it is in diet sodas, chewing gum, etc) Read the labels!  Try to read Dr. Janene Harvey book: "Program for Reversing Diabetes" for other ideas for healthy eating.

## 2016-10-16 DIAGNOSIS — M791 Myalgia: Secondary | ICD-10-CM | POA: Diagnosis not present

## 2016-10-16 DIAGNOSIS — M9906 Segmental and somatic dysfunction of lower extremity: Secondary | ICD-10-CM | POA: Diagnosis not present

## 2016-10-16 DIAGNOSIS — Z96652 Presence of left artificial knee joint: Secondary | ICD-10-CM | POA: Diagnosis not present

## 2016-10-16 DIAGNOSIS — M9902 Segmental and somatic dysfunction of thoracic region: Secondary | ICD-10-CM | POA: Diagnosis not present

## 2016-10-16 DIAGNOSIS — M19071 Primary osteoarthritis, right ankle and foot: Secondary | ICD-10-CM | POA: Diagnosis not present

## 2016-10-16 DIAGNOSIS — M25612 Stiffness of left shoulder, not elsewhere classified: Secondary | ICD-10-CM | POA: Diagnosis not present

## 2016-10-16 DIAGNOSIS — Z96612 Presence of left artificial shoulder joint: Secondary | ICD-10-CM | POA: Diagnosis not present

## 2016-10-16 DIAGNOSIS — M9901 Segmental and somatic dysfunction of cervical region: Secondary | ICD-10-CM | POA: Diagnosis not present

## 2016-10-16 DIAGNOSIS — M50223 Other cervical disc displacement at C6-C7 level: Secondary | ICD-10-CM | POA: Diagnosis not present

## 2016-10-17 ENCOUNTER — Encounter: Payer: Self-pay | Admitting: *Deleted

## 2016-10-28 MED FILL — INVOKANA 100 MG TABLET: 100 | 30 days supply | Qty: 30 | Fill #1

## 2016-11-02 DIAGNOSIS — G4733 Obstructive sleep apnea (adult) (pediatric): Secondary | ICD-10-CM | POA: Diagnosis not present

## 2016-11-04 ENCOUNTER — Ambulatory Visit (INDEPENDENT_AMBULATORY_CARE_PROVIDER_SITE_OTHER): Payer: 59 | Admitting: Pulmonary Disease

## 2016-11-04 ENCOUNTER — Encounter: Payer: Self-pay | Admitting: Pulmonary Disease

## 2016-11-04 VITALS — BP 144/80 | HR 75 | Temp 98.8°F | Ht 72.0 in | Wt 305.5 lb

## 2016-11-04 DIAGNOSIS — R911 Solitary pulmonary nodule: Secondary | ICD-10-CM | POA: Diagnosis not present

## 2016-11-04 DIAGNOSIS — Z6841 Body Mass Index (BMI) 40.0 and over, adult: Secondary | ICD-10-CM

## 2016-11-04 DIAGNOSIS — Z96652 Presence of left artificial knee joint: Secondary | ICD-10-CM | POA: Diagnosis not present

## 2016-11-04 DIAGNOSIS — I350 Nonrheumatic aortic (valve) stenosis: Secondary | ICD-10-CM | POA: Diagnosis not present

## 2016-11-04 DIAGNOSIS — I10 Essential (primary) hypertension: Secondary | ICD-10-CM

## 2016-11-04 DIAGNOSIS — M8949 Other hypertrophic osteoarthropathy, multiple sites: Secondary | ICD-10-CM

## 2016-11-04 DIAGNOSIS — R931 Abnormal findings on diagnostic imaging of heart and coronary circulation: Secondary | ICD-10-CM

## 2016-11-04 DIAGNOSIS — R972 Elevated prostate specific antigen [PSA]: Secondary | ICD-10-CM

## 2016-11-04 DIAGNOSIS — G4733 Obstructive sleep apnea (adult) (pediatric): Secondary | ICD-10-CM

## 2016-11-04 DIAGNOSIS — E1165 Type 2 diabetes mellitus with hyperglycemia: Secondary | ICD-10-CM

## 2016-11-04 DIAGNOSIS — M159 Polyosteoarthritis, unspecified: Secondary | ICD-10-CM

## 2016-11-04 DIAGNOSIS — E782 Mixed hyperlipidemia: Secondary | ICD-10-CM

## 2016-11-04 DIAGNOSIS — M150 Primary generalized (osteo)arthritis: Secondary | ICD-10-CM | POA: Diagnosis not present

## 2016-11-04 DIAGNOSIS — Z96612 Presence of left artificial shoulder joint: Secondary | ICD-10-CM

## 2016-11-04 NOTE — Patient Instructions (Signed)
Today we updated your med list in our EPIC system...    Continue your current medications the same...  Today we assessed your OSA on the new ResMed S10 machine, checked a download , & confirmed your excellent compliance & benefit from the apparatus...  You have a Urology appt coming up soon and still need to set up your colonoscopy w/ drGessner...  Call for any questions...  Let's plan a follow up visit in 38mo, sooner if needed for problems.Marland KitchenMarland Kitchen

## 2016-11-04 NOTE — Progress Notes (Signed)
Subjective:     Patient ID: Rodney Hayes, male   DOB: 06/25/1951, 66 y.o.   MRN: [id number]  HPI  ~  August 04, 2015:  Initial medical visit w/ SN>   Rodney Hayes is a 66 y/o NP in the [facility]/ [facility] division here to establish general medical care (DrHopper has retired- I have reviewed his prev notes)...   I have reviewed the extensive old records in [place] and establish the following problem list and DATA tabulation>>   DATA:   ~  2DEcho 09/03/11 showed mild LVH, mild focal asal hypertrophy of septum, norm LVF w/ EF=60-65%, norm wall motion, Gr1DD, mild calcif of AoV leaflets- no AS, trivAI, MV- wnl, mild LAdil, norm PAsys...  ~  CXR 01/06/13 showed heart at upper lim of norm, min peribronch thickening otherw clear lungs, NAD, Tspine osteophytes...  ~  EKG 02/07/14 showed NSR w/ occ PVCs, poor R prog V1-3, 1st degree AVB...  ~  Cardiac CT 04/01/14 showed very high calcium score=1847; norm Ao root, AoV trileaflet, Lmain=ok, LAD plaque worst in mid-vessel ~70%, 1st diag ~50%, midRCA is heavily calcif but can't eval stenosis due to motion; CATH was rec- but pt declined... Additional findings: 66m RLL nodule medially,  ~  Exercise Treadmill Test 04/2014> 631m, 7Mets, HR= 73-139 (89%max target), +HBP response, no CP, no EKG changes... ~  CT Chest 10/11/14 showed coronary calcif, sm nodes, 64m20mLL nodule (it measured 6mm73m2015) & airway plugging in RLL, min linear scarring RUL, left shoulder arthritis & thoracic spondylosis,   PROBLEM LIST:     OSA on CPAP> cannot find prev sleep study in EPIC ([facility] he is on CPAP 12, tolerates well & keeps up w/ mask/ tubing changes Q6mo,51moellent compliance/ rests well/ no daytime sleepiness issues...     Pulmonary nodule on CT Chest, ex-smoker, Hx +PPD, remote hx asthma/ alergies as child> Ex-smoker approx 40pk-yrs & quit 1994; has not required resp meds; CT Chest 9/15 & 3/16 as above (64mmRL41module) f/u due 3/17; there is a report  of a pos PPD in the past & not treated w/ INH...    HBP, Heart murmur at base> on Amlod10, Cardura2Bid, Losar100, HCT25; BP= 122/76 today, usually sl higher at home; 2DEcho w/ mild LVH & ETT w/ hypertensive response; he declined to take BBlocker rec by DrHopper 8/16; denies CP, palpit, SOB, etc...     Coronary calcium score >400... on ASA81; he has +FamHx CAD w/ CABG in father, coronary calcif seen on CTChest & a very high coronary calcium score on CardiacCT (1847)(1443was rec to have CATH but declined- he does not have CP & is very active w/ exercise program; neg ETT 10/15;  I too feel that he should have diagnostic cath w/ his mult coronary risk factors & we discussed this, but he respectfully declined...    Venous insuffic> trophic changes of VI on exam, hx intermittent edema on HCT25 daily & wears support hose...    Hx hyperlipidemia> remote labs showed TG up to 372 in past, TChol & LDL have always been good;  [facility]  FLP 2/16 showed TChol 101, TG 104, HDL 38, LDL 43 on diet alone; FLP 1/17 showed TChol 147, TG 192, HDL 39, LDL 70;  He knows that low fat/ low carb diet 7 wt loss will bring TG back to normal...    DM2> on Metform500-2Bid, Onglyza5; Urine microalb & renal function  normal; no retinopathy; recent accuchecks= 170-280 at home; POC A1c values reports >8.0; Labs 07/2015 showed FBS=183, A1c=9.6; we discussed additional meds but he admits to poor diet over the last 59mo& wants 365521morial of vigorous diet/ continued exercise/ and weight reduction w/ repeat labs...    Obesity> despite his best efforts weight has remained ~300# & we reviewed diet/ exercise/ wt reduction strategies; strive for 20# increments...     Hx colon polyps, divertics> last colonoscopy 06/2009 by DrGessner showed 53m68molyp in mid-transverse colon (adenomatous), mild divertics; he is overdue for f/u colon & will contact GI at his convenience...     Hx HepB infection> from needle stick exposure  1978 (renal Tx pt), treated conservatively & resolved, LFTs have all been wnl since then...     DJD, s/p L-TKR, s/p L-shoulder replacement> followed by DrAlusio & DrNorris...  EXAM shows Afeb, VSS, O2sat=94% on RA;  HEENT- neg, mallampati3;  Chest- clear w/o w/r/r;  Heart- RR gr1-2/6 SEM at base w/o r/g;  Abd- obese,soft, nontender;  Ext- VI changes w/o c/c/e;  Neuro- intact...  LABS 07/2015>  FLP- ok on diet alone w/ good Chol but TG up to 192 (needs better diet);  Chems ok x BS=183, A1c=9.6 (needs additional meds);  CBC, TSH= wnl;  PSA=3.82 representing incr PSA velocity & rec recheck in 50mo65moDEcho 07/2015>  Done 08/17/15> norm LVF w/ EF=55-60%, no regional wall motion abn, norm diastolic function, MILD AS (mildly thickened & calcif leaflets- mean gradient=10, peak=21), norm RV but PAsys ~32mm56m. IMP/PLAN>>  Rodney Landryere to establish with us, pKoreav PCP= DrHopper, f/u labs and 2DEcho are pending;  Concerns include weight, BP, coronary art dis and mult coronary risk factors;  He has an incidental 7mm R80mnodule seen on prev CT & f/u due 09/2015;  We discussed TG, BS/A1c, & PSA as above- he is committed to low fat/ low carb diet & wt reduction w/ recheck FLP, BS/A1c in 521mo; h8moll need a f/u PSA in 50mo;  F5521mow up screening colon is overdue as well & he will contact DrGessner at his earliest convenience...  ADDENDUM>>  CT Chest 11/20/15 showed stable 6mm nodu35min RLL no change over 65mo inte15mo no new pulm nodules, no adenopathy, +coronary calcif as before, no change in diffuse distal esoph wall thickening...  ADDENDUM>>  Pt did not return for planned f/u labs in April2017;  I received note from [facility]icating that pt wanted to change his Metform500-2Bid +Onglyza5/d regimen back to KombiglyzeBriarcliffone tab Qam because he thought it was more effective for him... I sent a note to Pt requesting f/u FASTING blood work before making a decision about decreasing his med dose vs poss  referral to Endocrine/ DM specialist...  >>Rodney Hayes-- I received a note from the [facility] Metform500DeW[place]to return to KombiglyzeCrest1 tab daily (this is obviously 1/2 of your current dose);  I've attached a copy of our 07/2015 CPX- & note that Wt was 303#, BS=183, A1c=9.6 and the plan was to recheck the fasting blood work in 521mo on the91mos + vigorous diet/ exercise effort-- we are clearly overdue. & in addition we wanted to recheck the PSA at 50mo since i68mos 3.82 in Jan2017...   The [facility] people have been seeing you every 521mo & includ40mohis data>>  09/2015>  Wt=292#     01/2016>  Wt=301#     05/2016>  Wt=306#  How about we have you come by the office, or [facility] one morning at your convenience for f/u FASTING blood work before we make any changes...  ~  August 19, 2016:  1year ROV & medical follow up visit>  Rodney Hayes returns for a yearly CPX feeling well, no complaints or concerns; we reviewed the following medical problems during today's office visit >>     OSA on CPAP> cannot find prev sleep study in EPIC (ordered by [facility], read by DrClance), he is on CPAP 12, tolerates well & keeps up w/ mask/ tubing changes Q54mo he reports excellent compliance (can't sleep w/o it)/ rests well/ no daytime sleepiness issues; his machine is now 68yrold & we will request a new Res-Med S10 machine [facility])...    Pulmonary nodule on CT Chest, ex-smoker, Hx +PPD, remote hx asthma/ alergies as child> Ex-smoker approx 40pk-yrs & quit 1994; has not required resp meds; CT Chest 9/15, 3/16, & 5/17 as above (6-62m70mL nodule w/o change); there is a report of a pos PPD in the past & not treated w/ INH; he denies cough, sput, hemoptysis, SOB, CP, etc...    HBP, Heart murmur at base=> mild AS on 2D 2/17> on Amlod10, Cardura2Bid, Losar100, HCT25; BP= 154/72 today, similar at home; prev 2DEcho w/ mild LVH & ETT w/ hypertensive response;  he declined to take BBlocker rec by DrHopper 8/16; denies CP, palpit, SOB, edemaq, etc; 2DEcho 2/17 showed norm EF, no RWMA, but mild AS...     Coronary calcium score >400... on ASA81; he has +FamHx CAD w/ CABG in father, coronary calcif seen on CTChest & a very high coronary calcium score on CardiacCT (18(4580he was rec to have CATH but declined- he does not have CP & is very active w/ exercise program; neg ETT 10/15;  I too feel that he should have diagnostic cath w/ his mult coronary risk factors & we discussed this, but he respectfully declined again...    Venous insuffic> trophic changes of VI on exam, hx intermittent edema on HCT25 daily & wears support hose...    Hx hyperlipidemia> remote labs showed TG up to 372 in past, TChol & LDL have always been good;  [facility];  FLP 2/18 on diet alone showed TChol 166, TG 205, HDL 44, LDL 89;  He knows that low fat/ low carb diet & wt loss will bring TG back to normal...    DM2> on Metform500-2Bid, Onglyza5; Urine microalb & renal function normal; no retinopathy; recent accuchecks= 170-280 at home; POC A1c values reports >8.0; Labs 2/18 showed FBS=233, A1c=9.9; we discussed additional meds vs referral to Endocrine- DrGherghe; we decided to change Onglyza to VIC[place]8/d & set up f/u appt w/ Endocrine.    Obesity> despite his best efforts weight has remained ~300# & we reviewed diet/ exercise/ wt reduction strategies; strive for 20# increments...     Hx colon polyps, divertics> last colonoscopy 06/2009 by DrGessner showed 4mm33mlyp in mid-transverse colon (adenomatous), mild divertics; he is overdue for f/u colon & will contact GI at his convenience...     Hx HepB infection> from needle stick exposure 1978 (renal Tx pt), treated conservatively & resolved, LFTs have all been wnl since then...     Elevated PSA>  Labs 1/17 showed PSA=3.82 & he was asked to recheck in 9mo;61moA repeated 2/18 = 4.51 & we will refer to  Urology for  their exam & follow up...    DJD, s/p L-TKR, s/p L-shoulder replacement> followed by DrAlusio & DrNorris... EXAM shows Afeb, VSS, O2sat=95% on RA;  HEENT- neg, mallampati3;  Chest- clear w/o w/r/r;  Heart- RR gr1-2/6 SEM at base (AS) w/o r/g;  Abd- obese,soft, nontender;  Ext- VI changes w/o c/c/e;  Neuro- intact...  LABS 08/16/16>  FLP- ok x TG=202;  Chems- ok x BS=233, A1c=9.9;  PSA=4.51... IMP/PLAN>>  The above is reviewed w/ Rodney Hayes-- OSA & we will request a new ResMed S10 machine for him thru [facility];  Hi coronary calcium score, coronary art calc on CTs, & he's been rec to have cath but declines (he remains very active, exercising, etc but has mult coronary risk factors);  Chol is ok on diet but he needs better low fat diet & wt reduction for the TG;  DM control is poor & he has resisted med changes or consideration of insulin- he agrees to change Onglyza to [facility] 1.8/d & will establish w/ DM/Endocrine DrGherghe;  F/u colonoscopy is overdue w/ DrGessner & he will set this up at his convenience;  PSA is elev at 4.51 (slow steady increase) & we will refer to Urology for their analysis...   ADDENDUM>> Pt received new CPAP machine> ResMed S10, Air/ Auto, set CPAP 14cmH2O-- CPAP Download from 2/21 - 10/03/16 showed excellent compliance on his machine w/ CPAP 14cmH2O:  He used the machine 28/30 days (93%), ave 8H/ night, min air leak, and AHI~1/hr... rec to continue same settings and keep current on masks, tubing, etc... call for any issues...  ~  November 04, 2016:  32moROV & SRichardson Landryis here for a required face-to-face visit required by insurance/ DME for his new CPAP as above-- as noted he has severe OSA and required a new machine as per the ADDENDUM => CPAP download on this new deice confirms excellent compliance & efficacy of this CPAP device...     HBP, mild AS, elev coronary calcium score>  on ASA81, Amlod10, Cardura2Bid, Losar100, HCT25; BP= 144/80 and similar at home; there is a pos FamHx of coronary dis &  CARDS-DrBensimhon has prev rec cardiac cath but pt declined siting no CP, very active w/ vigorous exercise at the gym, etc...    Hypertriglyceridemia>  On diet alone, not on meds, hx intol to Lipitor; TChol, HDL, LDLhave been OK; last FLP showed TG=205 on diet/ exercise...    DM2, obesity> on Victoza1.8, Metform500-2Bid, Inkovana100; followed by DrGherghe & last seen 11/14/16 w/ BS=141 & A1c improved to 7.3; his wt is 306# today & we reviewed diet/ exercise/ wt reduction strategies...    Elev PSA => he has upcoming appt w/ DrBorden for further eval & biopsy...    DJD, s/p L-TKR, s/p L-shoulder replacement> followed by DrAlusio & DrNorris... EXAM shows Afeb, VSS, O2sat=94% on RA;  HEENT- neg, mallampati3;  Chest- clear w/o w/r/r;  Heart- RR gr1-2/6 SEM at base (AS) w/o r/g;  Abd- obese,soft, nontender;  Ext- VI changes w/o c/c/e;  Neuro- intact... IMP/PLAN>>  SRichardson Landryis stable & awaiting his appt w/ DrBorden regarding elev PSA reading;  The main purpose of this face-to-face meeting was to satis criteria from insurance/DME regarding his new CPAP machine which is confirmed by download to be effective w/ excellent compliance...     Past Medical History:  Diagnosis Date  . Arthritis   . Asthma   . BPH (benign prostatic hyperplasia)   . Diabetes mellitus    average fasting  140s  . DJD (degenerative joint disease)   . Heart murmur    [name]  . Hyperlipidemia   . Hypertension   . Nonspecific abnormal electrocardiogram (ECG) (EKG)   . Obesity   . Shortness of breath dyspnea    exertion  . Sleep apnea    C-PAP    Past Surgical History:  Procedure Laterality Date  . Biceps tendon reinsertion    . colonoscopy with polypectomy     adenomatous; [name]  . JOINT REPLACEMENT Left    knee  . left knee arthroscopy    . TONSILLECTOMY AND ADENOIDECTOMY    . TOTAL KNEE ARTHROPLASTY Left 01/11/2013   Procedure: LEFT TOTAL KNEE ARTHROPLASTY;  Surgeon: [name], MD;  Location: [facility];   Service: Orthopedics;  Laterality: Left;  . TOTAL SHOULDER ARTHROPLASTY Left 08/12/2014   Procedure: LEFT TOTAL SHOULDER ARTHROPLASTY;  Surgeon: [name], MD;  Location: [facility];  Service: Orthopedics;  Laterality: Left;  . UMBILICAL HERNIA REPAIR      Outpatient Encounter Prescriptions as of 11/04/2016  Medication Sig  . amLODipine (NORVASC) 10 MG tablet Take 1 tablet (10 mg total) by mouth every morning.  Marland Kitchen aspirin EC 81 MG tablet Take 1 tablet (81 mg total) by mouth daily.  . canagliflozin (INVOKANA) 100 MG TABS tablet Take 1 tablet (100 mg total) by mouth daily before breakfast.  . cholecalciferol (VITAMIN D) 1000 units tablet Take 1,000 Units by mouth daily.  Marland Kitchen doxazosin (CARDURA) 2 MG tablet Take 1 tablet (2 mg total) by mouth 2 (two) times daily.  . Glucose Blood (TRUE METRIX BLOOD GLUCOSE TEST VI) Use to test glucose once daily  . hydrochlorothiazide (HYDRODIURIL) 25 MG tablet Take 1 tablet (25 mg total) by mouth daily.  Marland Kitchen liraglutide (VICTOZA) 18 MG/3ML SOPN Inject 0.3 mLs (1.8 mg total) into the skin daily.  Marland Kitchen losartan (COZAAR) 100 MG tablet Take 1 tablet (100 mg total) by mouth daily.  . metFORMIN (GLUCOPHAGE) 500 MG tablet TAKE 2 TABLETS BY MOUTH TWICE DAILY AFTER 2 LARGEST MEALS  . Multiple Vitamins-Minerals (MULTIVITAMIN PO) Take 1 tablet by mouth daily. Reported on 01/25/2016  . [DISCONTINUED] ONGLYZA 5 MG TABS tablet TAKE 1 TABLET BY MOUTH DAILY. (Patient not taking: Reported on 11/04/2016)   No facility-administered encounter medications on file as of 11/04/2016.     Allergies  Allergen Reactions  . Cymbalta [Duloxetine Hcl]     As per e-mail 12/13/11: Insomnia, lethargy, inability to ejaculate  . Tetracycline     REACTION: PHOTOSENSITIVITY  He is also sensitive to STATIN meds w/ severe arthralgias...    Immunization History  Administered Date(s) Administered  . Influenza Whole 05/15/2012, 04/14/2013  . Influenza,inj,Quad PF,36+ Mos 04/18/2016  .  Influenza-Unspecified 04/15/2015  . Tdap 07/15/2008    Family History  Problem Relation Age of Onset  . Lung cancer Mother     smoker  . Diabetes Father   . Coronary artery disease Father     CBAG in late 60s  . Stroke Neg Hx   Father had HBP, DM, CAD-s/pCABG, prostate ca... Mother had DM, nonsmall cell lung cancer   Current Medications, Allergies, Past Medical History, Past Surgical History, Family History, and Social History were reviewed in [employer] record.   Review of Systems             All symptoms NEG except where BOLDED >>  Constitutional:  F/C/S, fatigue, anorexia, unexpected weight change. HEENT:  HA, visual changes,  hearing loss, earache, nasal symptoms, sore throat, mouth sores, hoarseness. Resp:  cough, sputum, hemoptysis; SOB, tightness, wheezing. Cardio:  CP, palpit, DOE, orthopnea, edema (intermittently) GI:  N/V/D/C, blood in stool; reflux, abd pain, distention, gas. GU:  dysuria, freq, urgency, hematuria, flank pain, voiding difficulty. MS:  joint pain, swelling, tenderness, decr ROM; neck pain, back pain, etc. Neuro:  HA, tremors, seizures, dizziness, syncope, weakness, numbness, gait abn. Skin:  suspicious lesions or skin rash. Heme:  adenopathy, bruising, bleeding. Psyche:  confusion, agitation, sleep disturbance (OSA), hallucinations, anxiety, depression suicidal.   Objective:   Physical Exam       Vital Signs:  Reviewed...   General:  WD, obese, 67 y/o WM in NAD; alert & oriented; pleasant & cooperative... HEENT:  [place]/AT; Conjunctiva- pink, Sclera- nonicteric, EOM-wnl, PERRLA, EACs-clear, TMs-wnl; NOSE-clear; THROAT-clear & wnl.  Neck:  Supple w/ fair ROM; no JVD; normal carotid impulses w/o bruits; no thyromegaly or nodules palpated; no lymphadenopathy.  Chest:  Clear to P & A; without wheezes, rales, or rhonchi heard. Heart:  Regular Rhythm; norm S1 & S2, Gr1-2/6 SEM at base, no rubs or gallops detected... Abdomen:  Obese,  soft & nontender- no guarding or rebound; normal bowel sounds; no organomegaly or masses palpated. Ext:  +arthritic changes w/ L-TKR & L-shoulder replacement; +venous insuffic changes, no VV or edema;  Pulses intact w/o bruits. Neuro:  CNs II-XII intact; motor testing normal; sensory testing normal; gait normal & balance OK. Derm:  No lesions noted; no rash etc. Lymph:  No cervical, supraclavicular, axillary, or inguinal adenopathy palpated.   Assessment:         OSA on CPAP> on CPAP 14 w/ new ResMed S10 machine -- CPAP download reveals excellent compliance 7 efficacy w/ AHI=1, continue same settings...     Pulmonary nodule on CT Chest, ex-smoker, Hx +PPD, remote hx asthma/ alergies as child> not requiring pulm meds; f/u CTChest is due 09/2015...    HBP, Heart murmur at base> on Amlod10, Cardura2Bid, Losar100, HCT25; repeat 2DEcho=> pending    Coronary calcium score >400... on ASA81; he has +FamHx CAD w/ CABG in father, coronary calcif seen on CTChest & a very high coronary calcium score on CardiacCT (1610); he was rec to have CATH but declined- he does not have CP & is very active w/ exercise program; neg ETT 10/15...    Venous insuffic> aware- on HCT25, low sodium, wears support hose, etc...    Hx hyperlipidemia> on diet alone; Cards wanted him on Statin but he was intol to Lip20 w/ joint pain; Labs 07/2015 confirms good Chol values but TG=192 off diet (this will ret to norm on low fat wt reducing diet).    DM2> on Metform500-2Bid, Onglyza5; Labs 07/2015 showed FBS=183, A1c=9,6 & he admits to poor diet recently & is committed to low carb diet 7 wt reduction now=> plan f/u labs in 45mo..    Obesity> despite intermittently losing some weight, he has not been able to keep it off; we reviewed diet, exercise, weight reduction strategies including bariatric options...    Hx colon polyps, divertics> followed by DRance Muir he is overdue for f/u colon & will set this up at his earliest convenience...    Hx  HepB infection> from needle stick exposure 1978 (renal Tx pt), treated conservatively & resolved, LFTs have all been wnl since then...     Elev PSA>  PSA up to 4.51 [id number]& he is referred to Urology...    DJD, s/p L-TKR, s/p L-shoulder replacement>  followed by DrAlusio & DrNorris...     Plan:     Patient's Medications  New Prescriptions   No medications on file  Previous Medications   AMLODIPINE (NORVASC) 10 MG TABLET    Take 1 tablet (10 mg total) by mouth every morning.   ASPIRIN EC 81 MG TABLET    Take 1 tablet (81 mg total) by mouth daily.   CANAGLIFLOZIN (INVOKANA) 100 MG TABS TABLET    Take 1 tablet (100 mg total) by mouth daily before breakfast.   CHOLECALCIFEROL (VITAMIN D) 1000 UNITS TABLET    Take 1,000 Units by mouth daily.   DOXAZOSIN (CARDURA) 2 MG TABLET    Take 1 tablet (2 mg total) by mouth 2 (two) times daily.   GLUCOSE BLOOD (TRUE METRIX BLOOD GLUCOSE TEST VI)    Use to test glucose once daily   HYDROCHLOROTHIAZIDE (HYDRODIURIL) 25 MG TABLET    Take 1 tablet (25 mg total) by mouth daily.   LIRAGLUTIDE (VICTOZA) 18 MG/3ML SOPN    Inject 0.3 mLs (1.8 mg total) into the skin daily.   LOSARTAN (COZAAR) 100 MG TABLET    Take 1 tablet (100 mg total) by mouth daily.   METFORMIN (GLUCOPHAGE) 500 MG TABLET    TAKE 2 TABLETS BY MOUTH TWICE DAILY AFTER 2 LARGEST MEALS   MULTIPLE VITAMINS-MINERALS (MULTIVITAMIN PO)    Take 1 tablet by mouth daily. Reported on 01/25/2016  Modified Medications   No medications on file  Discontinued Medications   ONGLYZA 5 MG TABS TABLET    TAKE 1 TABLET BY MOUTH DAILY.

## 2016-11-07 ENCOUNTER — Telehealth: Payer: Self-pay | Admitting: Pulmonary Disease

## 2016-11-07 MED ORDER — GLUCOSE BLOOD VI STRP: ORAL_STRIP | 12 refills | Status: DC

## 2016-11-07 MED FILL — NAPROXEN 500 MG TABLET: 500 | 4 days supply | Qty: 12 | Fill #0

## 2016-11-07 MED FILL — HYDROCODON-APAP 7.5-325: 7.5-325 | 3 days supply | Qty: 10 | Fill #0

## 2016-11-07 MED FILL — AMOX-CLAV 875-125 MG TABLET: 875-125 | 7 days supply | Qty: 14 | Fill #0

## 2016-11-07 MED FILL — ACCU-CHEK GUIDE TEST STRIP: 30 days supply | Qty: 100 | Fill #0

## 2016-11-07 NOTE — Telephone Encounter (Signed)
Pt called back and I let him know that test strips had been e-scribed over pt satisfied.Hillery Hunter'

## 2016-11-07 NOTE — Telephone Encounter (Signed)
lmtcb X1 for pt. Glucose test strips [facility].

## 2016-11-08 DIAGNOSIS — R972 Elevated prostate specific antigen [PSA]: Secondary | ICD-10-CM | POA: Diagnosis not present

## 2016-11-12 ENCOUNTER — Other Ambulatory Visit: Payer: Self-pay | Admitting: Pulmonary Disease

## 2016-11-12 MED ORDER — GLUCOSE BLOOD VI STRP: ORAL_STRIP | 12 refills | Status: DC

## 2016-11-12 MED ORDER — ACCU-CHEK FASTCLIX LANCET KIT: PACK | 5 refills | Status: AC

## 2016-11-12 MED FILL — ACCU-CHEK FASTCLIX LANCETS: 30 days supply | Qty: 102 | Fill #0

## 2016-11-13 MED FILL — levoFLOXacin 500 MG TABS: 500 | 1 days supply | Qty: 1 | Fill #0

## 2016-11-14 ENCOUNTER — Encounter: Payer: Self-pay | Admitting: Internal Medicine

## 2016-11-14 ENCOUNTER — Ambulatory Visit (INDEPENDENT_AMBULATORY_CARE_PROVIDER_SITE_OTHER): Payer: 59 | Admitting: Internal Medicine

## 2016-11-14 VITALS — BP 130/74 | HR 79 | Wt 306.0 lb

## 2016-11-14 DIAGNOSIS — E1165 Type 2 diabetes mellitus with hyperglycemia: Secondary | ICD-10-CM

## 2016-11-14 LAB — BASIC METABOLIC PANEL WITH GFR
BUN: 14 mg/dL (ref 7–25)
CHLORIDE: 99 mmol/L (ref 98–110)
CO2: 27 mmol/L (ref 20–31)
Calcium: 9.5 mg/dL (ref 8.6–10.3)
Creat: 0.92 mg/dL (ref 0.70–1.25)
GFR, Est African American: >89 mL/min (ref >=60)
GFR, Est Non African American: 85 mL/min (ref >=60)
GLUCOSE: 141 mg/dL — AB (ref 65–99)
Potassium: 3.6 mmol/L (ref 3.5–5.3)
Sodium: 138 mmol/L (ref 135–146)

## 2016-11-14 LAB — POCT GLYCOSYLATED HEMOGLOBIN (HGB A1C): HEMOGLOBIN A1C: 7.3

## 2016-11-14 MED ORDER — CANAGLIFLOZIN 100 MG PO TABS: 100.0000 mg | ORAL_TABLET | Freq: Every day | ORAL | 3 refills | Status: DC

## 2016-11-14 NOTE — Patient Instructions (Addendum)
Please continue: - Metformin 1000 mg 2x a day with meals - Victoza 1.8 mg daily in am - Invokana 100 mg daily in am  [place]!  Please return in 3 months with your sugar log.

## 2016-11-14 NOTE — Progress Notes (Signed)
Patient ID: Rodney Hayes, male   DOB: 07-10-1951, 66 y.o.   MRN: [id number]   HPI: Rodney Hayes is a 66 y.o.-year-old male, returning for f/u for DM2, dx in 2011, non-insulin-dependent, uncontrolled, without long term complications. Last visit 1.5 mo ago.  He lost 5 lbs since last visit after starting Invokana. His sugars are also much better, despite traveling to [place] since last visit. His blood pressure is also better. However, Anastasio Auerbach is extensive, costing him $3 per pill.  Last hemoglobin A1c was: Lab Results  Component Value Date   HGBA1C 9.9 (H) 08/16/2016   HGBA1C 9.6 (H) 08/09/2015   HGBA1C 7.1 (H) 08/25/2014  He was off his DM meds >> 1 year ago >> started back recently.  Pt is on a regimen of: - Metformin 1000 mg 2x a day with meals - Victoza 1.8 mg daily in am+ - Invokana 100 mg daily in am  - started 09/2016 Previously on Kombiglyze 2.11-998 mg daily   Pt checks his sugars 1x a day. Sugars overall are much better, with the only exception being in the last few days, due to an oral infection, for which she is on Augmentin: - am: 149-200, 222 >> 111-154, 169 - 2h after b'fast: n/c >> 104, 210 - before lunch: n/c  - 2h after lunch: n/c - before dinner: n/c >> 154 - 2h after dinner: n/c >> 153 - bedtime: n/c - nighttime: n/c No lows. Lowest sugar was 124 >> 104; he has hypoglycemia awareness at 70.  Highest sugar was 330 >> 210.  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 - last set of lipids: Lab Results  Component Value Date   CHOL 166 08/16/2016   HDL 44.00 08/16/2016   LDLCALC 70 08/09/2015   LDLDIRECT 89.0 08/16/2016   TRIG 205.0 (H) 08/16/2016   CHOLHDL 4 08/16/2016  Statin >> joint pain. - last eye exam was in 05/2016. No DR. + cataract. - No numbness and tingling in his feet.  He also has a history of HTN, OSA, elevated PSA.  ROS: Constitutional: + weight loss, no fatigue, no subjective hyperthermia, no subjective hypothermia Eyes: no blurry vision, no xerophthalmia ENT: no sore throat, no nodules palpated in throat, no dysphagia, no odynophagia, no hoarseness Cardiovascular: no CP/no SOB/no palpitations/no leg swelling Respiratory: no cough/no SOB/no wheezing Gastrointestinal: no N/no V/no D/no C/no acid reflux Musculoskeletal: no muscle aches/no joint aches Skin: no rashes, no hair loss Neurological: no tremors/no numbness/no tingling/no dizziness  I reviewed pt's medications, allergies, PMH, social hx, family hx, and changes were documented in the history of present illness. Otherwise, unchanged from my initial visit note.  Past Medical History:  Diagnosis Date  . Arthritis   . Asthma   . BPH (benign prostatic hyperplasia)   . Diabetes mellitus    average fasting 140s  . DJD (degenerative joint disease)   . Heart murmur    [name]  . Hyperlipidemia   . Hypertension   . Nonspecific abnormal  electrocardiogram (ECG) (EKG)   . Obesity   . Shortness of breath dyspnea    exertion  . Sleep apnea    C-PAP   Past Surgical History:  Procedure Laterality Date  . Biceps tendon reinsertion    . colonoscopy with polypectomy     adenomatous; [name]  . JOINT REPLACEMENT Left    knee  . left knee arthroscopy    . TONSILLECTOMY AND ADENOIDECTOMY    . TOTAL KNEE ARTHROPLASTY Left 01/11/2013   Procedure: LEFT TOTAL KNEE ARTHROPLASTY;  Surgeon: [name], MD;  Location: [facility];  Service: Orthopedics;  Laterality: Left;  . TOTAL SHOULDER ARTHROPLASTY Left 08/12/2014   Procedure:  LEFT TOTAL SHOULDER ARTHROPLASTY;  Surgeon: [name], MD;  Location: [facility];  Service: Orthopedics;  Laterality: Left;  . UMBILICAL HERNIA REPAIR     Social History   Social History  . Marital status: Married    Spouse name: N/A  . Number of children: 20 - 68 -year-old son (2018)    Occupational History  . ACNP - [employer]    Social History Main Topics  . Smoking status: Former Smoker    Packs/day: 2.00    Years: 30.00    Types: Cigarettes    Start date: 07/15/1962    Quit date: 08/15/1992  . Smokeless tobacco: Never Used     Comment: smoked 14 -42 up to 2 ppd  . Alcohol use 1-2 glasses    5x a week     Comment: Red Wine  . Drug use: No   Current Outpatient Prescriptions on File Prior to Visit  Medication Sig Dispense Refill  . amLODipine (NORVASC) 10 MG tablet Take 1 tablet (10 mg total) by mouth every morning. 90 tablet 3  . aspirin EC 81 MG tablet Take 1 tablet (81 mg total) by mouth daily. 90 tablet 3  . canagliflozin (INVOKANA) 100 MG TABS tablet Take 1 tablet (100 mg total) by mouth daily before breakfast. 30 tablet 5  . cholecalciferol (VITAMIN D) 1000 units tablet Take 1,000 Units by mouth daily.    Marland Kitchen doxazosin (CARDURA) 2 MG tablet Take 1 tablet (2 mg total) by mouth 2 (two) times daily. 180 tablet 3  . glucose blood (ACCU-CHEK GUIDE) test strip Use as instructed 100 each 12  . hydrochlorothiazide (HYDRODIURIL) 25 MG tablet Take 1 tablet (25 mg total) by mouth daily. 90 tablet 3  . Lancets Misc. (ACCU-CHEK FASTCLIX LANCET) KIT Use as directed 1 kit 5  . liraglutide (VICTOZA) 18 MG/3ML SOPN Inject 0.3 mLs (1.8 mg total) into the skin daily. 3 pen 3  . losartan (COZAAR) 100 MG tablet Take 1 tablet (100 mg total) by mouth daily. 90 tablet 3  . metFORMIN (GLUCOPHAGE) 500 MG tablet TAKE 2 TABLETS BY MOUTH TWICE DAILY AFTER 2 LARGEST MEALS 360 tablet 3  . Multiple Vitamins-Minerals (MULTIVITAMIN PO) Take 1 tablet by mouth daily. Reported on 01/25/2016     No  current facility-administered medications on file prior to visit.    Allergies  Allergen Reactions  . Cymbalta [Duloxetine Hcl]     As per e-mail 12/13/11: Insomnia, lethargy, inability to ejaculate  . Tetracycline     REACTION: PHOTOSENSITIVITY   Family History  Problem Relation Age of Onset  . Lung cancer Mother     smoker  . Diabetes Father   . Coronary artery disease Father     CBAG in late 26s  . Stroke Neg Hx  PE: BP 130/74 (BP Location: Left Arm, Patient Position: Sitting)   Pulse 79   Wt (!) 306 lb (138.8 kg)   SpO2 95%   BMI 41.50 kg/m  Wt Readings from Last 3 Encounters:  11/04/16 (!) 305 lb 8 oz (138.6 kg)  10/04/16 (!) 311 lb (141.1 kg)  05/16/16 (!) 306 lb (138.8 kg)   Constitutional: obese, in NAD Eyes: PERRLA, EOMI, no exophthalmos ENT: moist mucous membranes, no thyromegaly, no cervical lymphadenopathy Cardiovascular: RRR, No MRG, + B mild Peripheral edema Respiratory: CTA B Gastrointestinal: abdomen soft, NT, ND, BS+ Musculoskeletal: no deformities, strength intact in all 4 Skin: moist, warm, no rashes Neurological: no tremor with outstretched hands, DTR normal in all 4  ASSESSMENT: 1. DM2, non-insulin-dependent, uncontrolled, without long term complications, but with hyperglycemia  PLAN:  1. Patient with long-standing, uncontrolled diabetes, on oral antidiabetic regimen +  GLP-1 receptor agonist, And now on Invokana added at last visit. He did extremely well on the medication with improvement in blood sugars, weight, and blood pressure. He is very happy with the results. He also started to change his diet, which I am convinced also helped. He has no side effects from Invokana - today we'll check a BMP to verify his GFR and potassium on the SGLT2 inhibitor - I suggested to:  Patient Instructions  Please continue: - Metformin 1000 mg 2x a day with meals - Victoza 1.8 mg daily in am - Invokana 100 mg daily in am  [place]!  Please  return in 3 months with your sugar log.   - continue checking sugars at different times of the day - check 1x a day, rotating checks - advised for yearly eye exams >> he is UTD - advised for flu shot >> he is UTD - today, HbA1c was 7.3% (decreased from 9.9%!) - Return to clinic in 3 mo with sugar log   Office Visit on 11/14/2016  Component Date Value Ref Range Status  . Sodium 11/14/2016 138  135 - 146 mmol/L Final  . Potassium 11/14/2016 3.6  3.5 - 5.3 mmol/L Final  . Chloride 11/14/2016 99  98 - 110 mmol/L Final  . CO2 11/14/2016 27  20 - 31 mmol/L Final  . Glucose, Bld 11/14/2016 141* 65 - 99 mg/dL Final  . BUN 11/14/2016 14  7 - 25 mg/dL Final  . Creat 11/14/2016 0.92  0.70 - 1.25 mg/dL Final   Comment:   For patients > or = 68 years of age: The upper reference limit for Creatinine is approximately 13% higher for people identified as African-American.     . Calcium 11/14/2016 9.5  8.6 - 10.3 mg/dL Final  . GFR, Est African American 11/14/2016 >89  >=60 mL/min Final  . GFR, Est Non African American 11/14/2016 85  >=60 mL/min Final  . Hemoglobin A1C 11/14/2016 7.3   Final   Labs are normal >> continue Invokana.  Philemon Kingdom, MD PhD [facility] Endocrinology

## 2016-11-18 MED FILL — VICTOZA 18 MG/3 ML INJECT P: 18 | 90 days supply | Qty: 27 | Fill #1

## 2016-11-19 DIAGNOSIS — R972 Elevated prostate specific antigen [PSA]: Secondary | ICD-10-CM | POA: Diagnosis not present

## 2016-11-21 MED FILL — UNIFINE PENTIPS 31GX3/16": 31G X 5 MM | 90 days supply | Qty: 100 | Fill #1

## 2016-11-21 MED FILL — UNIFINE PENTIPS 31GX3/16: 31G X 5 MM | 90 days supply | Qty: 100 | Fill #1

## 2016-11-25 MED FILL — INVOKANA 100 MG TABLET: 100 | 90 days supply | Qty: 90 | Fill #2

## 2016-12-02 DIAGNOSIS — G4733 Obstructive sleep apnea (adult) (pediatric): Secondary | ICD-10-CM | POA: Diagnosis not present

## 2016-12-06 DIAGNOSIS — G4733 Obstructive sleep apnea (adult) (pediatric): Secondary | ICD-10-CM | POA: Diagnosis not present

## 2016-12-10 ENCOUNTER — Telehealth: Payer: Self-pay | Admitting: Internal Medicine

## 2016-12-10 DIAGNOSIS — C61 Malignant neoplasm of prostate: Secondary | ICD-10-CM | POA: Diagnosis not present

## 2016-12-10 MED FILL — LOSARTAN POTASSIUM 100 MG T: 100 | 90 days supply | Qty: 90 | Fill #1

## 2016-12-10 NOTE — Telephone Encounter (Signed)
Patient would like to come pick up another chart for him to keep track of his numbers or if it can be emailed to him. Can he just come pick it up tomorrow? Please call patient to advise.

## 2016-12-11 ENCOUNTER — Telehealth: Payer: Self-pay

## 2016-12-11 NOTE — Telephone Encounter (Signed)
Called and advised patient I had emailed him the logs. Patient had no questions at this time.

## 2016-12-16 ENCOUNTER — Other Ambulatory Visit: Payer: Self-pay | Admitting: Urology

## 2016-12-20 ENCOUNTER — Encounter (HOSPITAL_COMMUNITY): Payer: Self-pay | Admitting: *Deleted

## 2016-12-23 DIAGNOSIS — M6281 Muscle weakness (generalized): Secondary | ICD-10-CM | POA: Diagnosis not present

## 2016-12-23 DIAGNOSIS — C61 Malignant neoplasm of prostate: Secondary | ICD-10-CM | POA: Diagnosis not present

## 2016-12-23 MED FILL — DOXAZOSIN MESYLATE 2 MG TAB: 2 | 90 days supply | Qty: 180 | Fill #1

## 2016-12-23 MED FILL — HYDROCHLOROTHIAZIDE 25 MG T: 25 | 90 days supply | Qty: 90 | Fill #1

## 2016-12-23 MED FILL — metFORMIN HCL 500 MG TABS: 500 | 90 days supply | Qty: 360 | Fill #1

## 2016-12-23 MED FILL — AMLODIPINE BESYLATE 10 MG T: 10 | 90 days supply | Qty: 90 | Fill #1

## 2017-01-02 DIAGNOSIS — G4733 Obstructive sleep apnea (adult) (pediatric): Secondary | ICD-10-CM | POA: Diagnosis not present

## 2017-01-09 DIAGNOSIS — M6281 Muscle weakness (generalized): Secondary | ICD-10-CM | POA: Diagnosis not present

## 2017-01-09 DIAGNOSIS — N393 Stress incontinence (female) (male): Secondary | ICD-10-CM | POA: Diagnosis not present

## 2017-01-09 DIAGNOSIS — M62838 Other muscle spasm: Secondary | ICD-10-CM | POA: Diagnosis not present

## 2017-01-13 NOTE — Patient Instructions (Addendum)
Rodney Hayes  01/13/2017   Your procedure is scheduled on: 01/23/17   Report to [facility] Main  Entrance Take [place]  elevators to 3rd floor to  [place] at      [phone].    Call this number if you have problems the morning of surgery  [phone]    Remember: ONLY 1 PERSON MAY GO WITH YOU TO SHORT STAY TO GET  READY MORNING OF YOUR SURGERY.  Do not eat food or drink liquids :After Midnight.     Take these medicines the morning of surgery with A SIP OF WATER: doxazosin, amlodipine DO NOT TAKE ANY DIABETIC MEDICATIONS DAY OF YOUR SURGERY                               You may not have any metal on your body including hair pins and              piercings  Do not wear jewelry,, lotions, powders or perfumes, deodorant                         Men may shave face and neck.   Do not bring valuables to the hospital. [place].  Contacts, dentures or bridgework may not be worn into surgery.  Leave suitcase in the car. After surgery it may be brought to your room.               Please read over the following fact sheets you were given: _____________________________________________________________________            [facility] - Preparing for Surgery Before surgery, you can play an important role.  Because skin is not sterile, your skin needs to be as free of germs as possible.  You can reduce the number of germs on your skin by washing with CHG (chlorahexidine gluconate) soap before surgery.  CHG is an antiseptic cleaner which kills germs and bonds with the skin to continue killing germs even after washing. Please DO NOT use if you have an allergy to CHG or antibacterial soaps.  If your skin becomes reddened/irritated stop using the CHG and inform your nurse when you arrive at Short Stay. Do not shave (including legs and underarms) for at least 48 hours prior to the first CHG shower.  You may shave your  face/neck. Please follow these instructions carefully:  1.  Shower with CHG Soap the night before surgery and the  morning of Surgery.  2.  If you choose to wash your hair, wash your hair first as usual with your  normal  shampoo.  3.  After you shampoo, rinse your hair and body thoroughly to remove the  shampoo.                           4.  Use CHG as you would any other liquid soap.  You can apply chg directly  to the skin and wash                       Gently with a scrungie or clean washcloth.  5.  Apply the CHG Soap to your  body ONLY FROM THE NECK DOWN.   Do not use on face/ open                           Wound or open sores. Avoid contact with eyes, ears mouth and genitals (private parts).                       Wash face,  Genitals (private parts) with your normal soap.             6.  Wash thoroughly, paying special attention to the area where your surgery  will be performed.  7.  Thoroughly rinse your body with warm water from the neck down.  8.  DO NOT shower/wash with your normal soap after using and rinsing off  the CHG Soap.                9.  Pat yourself dry with a clean towel.            10.  Wear clean pajamas.            11.  Place clean sheets on your bed the night of your first shower and do not  sleep with pets. Day of Surgery : Do not apply any lotions/deodorants the morning of surgery.  Please wear clean clothes to the hospital/surgery center.  FAILURE TO FOLLOW THESE INSTRUCTIONS MAY RESULT IN THE CANCELLATION OF YOUR SURGERY PATIENT SIGNATURE_________________________________  NURSE SIGNATURE__________________________________  ________________________________________________________________________  WHAT IS A BLOOD TRANSFUSION? Blood Transfusion Information  A transfusion is the replacement of blood or some of its parts. Blood is made up of multiple cells which provide different functions.  Red blood cells carry oxygen and are used for blood loss  replacement.  White blood cells fight against infection.  Platelets control bleeding.  Plasma helps clot blood.  Other blood products are available for specialized needs, such as hemophilia or other clotting disorders. BEFORE THE TRANSFUSION  Who gives blood for transfusions?   Healthy volunteers who are fully evaluated to make sure their blood is safe. This is blood bank blood. Transfusion therapy is the safest it has ever been in the practice of medicine. Before blood is taken from a donor, a complete history is taken to make sure that person has no history of diseases nor engages in risky social behavior (examples are intravenous drug use or sexual activity with multiple partners). The donor's travel history is screened to minimize risk of transmitting infections, such as malaria. The donated blood is tested for signs of infectious diseases, such as HIV and hepatitis. The blood is then tested to be sure it is compatible with you in order to minimize the chance of a transfusion reaction. If you or a relative donates blood, this is often done in anticipation of surgery and is not appropriate for emergency situations. It takes many days to process the donated blood. RISKS AND COMPLICATIONS Although transfusion therapy is very safe and saves many lives, the main dangers of transfusion include:   Getting an infectious disease.  Developing a transfusion reaction. This is an allergic reaction to something in the blood you were given. Every precaution is taken to prevent this. The decision to have a blood transfusion has been considered carefully by your caregiver before blood is given. Blood is not given unless the benefits outweigh the risks. AFTER THE TRANSFUSION  Right after receiving a blood transfusion, you will usually feel  much better and more energetic. This is especially true if your red blood cells have gotten low (anemic). The transfusion raises the level of the red blood cells which  carry oxygen, and this usually causes an energy increase.  The nurse administering the transfusion will monitor you carefully for complications. HOME CARE INSTRUCTIONS  No special instructions are needed after a transfusion. You may find your energy is better. Speak with your caregiver about any limitations on activity for underlying diseases you may have. SEEK MEDICAL CARE IF:   Your condition is not improving after your transfusion.  You develop redness or irritation at the intravenous (IV) site. SEEK IMMEDIATE MEDICAL CARE IF:  Any of the following symptoms occur over the next 12 hours:  Shaking chills.  You have a temperature by mouth above 102 F (38.9 C), not controlled by medicine.  Chest, back, or muscle pain.  People around you feel you are not acting correctly or are confused.  Shortness of breath or difficulty breathing.  Dizziness and fainting.  You get a rash or develop hives.  You have a decrease in urine output.  Your urine turns a dark color or changes to pink, red, or brown. Any of the following symptoms occur over the next 10 days:  You have a temperature by mouth above 102 F (38.9 C), not controlled by medicine.  Shortness of breath.  Weakness after normal activity.  The white part of the eye turns yellow (jaundice).  You have a decrease in the amount of urine or are urinating less often.  Your urine turns a dark color or changes to pink, red, or brown. Document Released: 06/28/2000 Document Revised: 09/23/2011 Document Reviewed: 02/15/2008 ExitCare Patient Information 2014 [place].  _______________________________________________________________________  Incentive Spirometer  An incentive spirometer is a tool that can help keep your lungs clear and active. This tool measures how well you are filling your lungs with each breath. Taking long deep breaths may help reverse or decrease the chance of developing breathing (pulmonary) problems  (especially infection) following:  A long period of time when you are unable to move or be active. BEFORE THE PROCEDURE   If the spirometer includes an indicator to show your best effort, your nurse or respiratory therapist will set it to a desired goal.  If possible, sit up straight or lean slightly forward. Try not to slouch.  Hold the incentive spirometer in an upright position. INSTRUCTIONS FOR USE  1. Sit on the edge of your bed if possible, or sit up as far as you can in bed or on a chair. 2. Hold the incentive spirometer in an upright position. 3. Breathe out normally. 4. Place the mouthpiece in your mouth and seal your lips tightly around it. 5. Breathe in slowly and as deeply as possible, raising the piston or the ball toward the top of the column. 6. Hold your breath for 3-5 seconds or for as long as possible. Allow the piston or ball to fall to the bottom of the column. 7. Remove the mouthpiece from your mouth and breathe out normally. 8. Rest for a few seconds and repeat Steps 1 through 7 at least 10 times every 1-2 hours when you are awake. Take your time and take a few normal breaths between deep breaths. 9. The spirometer may include an indicator to show your best effort. Use the indicator as a goal to work toward during each repetition. 10. After each set of 10 deep breaths, practice coughing to be  sure your lungs are clear. If you have an incision (the cut made at the time of surgery), support your incision when coughing by placing a pillow or rolled up towels firmly against it. Once you are able to get out of bed, walk around indoors and cough well. You may stop using the incentive spirometer when instructed by your caregiver.  RISKS AND COMPLICATIONS  Take your time so you do not get dizzy or light-headed.  If you are in pain, you may need to take or ask for pain medication before doing incentive spirometry. It is harder to take a deep breath if you are having  pain. AFTER USE  Rest and breathe slowly and easily.  It can be helpful to keep track of a log of your progress. Your caregiver can provide you with a simple table to help with this. If you are using the spirometer at home, follow these instructions: [place] IF:   You are having difficultly using the spirometer.  You have trouble using the spirometer as often as instructed.  Your pain medication is not giving enough relief while using the spirometer.  You develop fever of 100.5 F (38.1 C) or higher. SEEK IMMEDIATE MEDICAL CARE IF:   You cough up bloody sputum that had not been present before.  You develop fever of 102 F (38.9 C) or greater.  You develop worsening pain at or near the incision site. MAKE SURE YOU:   Understand these instructions.  Will watch your condition.  Will get help right away if you are not doing well or get worse. Document Released: 11/11/2006 Document Revised: 09/23/2011 Document Reviewed: 01/12/2007 [facility] Patient Information 2014 [place], [place].   ________________________________________________________________________

## 2017-01-14 ENCOUNTER — Encounter (HOSPITAL_COMMUNITY): Payer: Self-pay

## 2017-01-14 ENCOUNTER — Encounter (HOSPITAL_COMMUNITY)
Admission: RE | Admit: 2017-01-14 | Discharge: 2017-01-14 | Disposition: A | Discharge status: Home / Self Care | Payer: 59 | Source: Ambulatory Visit | Attending: Urology | Admitting: Urology

## 2017-01-14 ENCOUNTER — Ambulatory Visit (HOSPITAL_COMMUNITY)
Admission: RE | Admit: 2017-01-14 | Discharge: 2017-01-14 | Disposition: A | Discharge status: Home / Self Care | Payer: 59 | Source: Ambulatory Visit | Attending: Urology | Admitting: Urology

## 2017-01-14 DIAGNOSIS — Z01818 Encounter for other preprocedural examination: Secondary | ICD-10-CM

## 2017-01-14 DIAGNOSIS — Z01812 Encounter for preprocedural laboratory examination: Secondary | ICD-10-CM | POA: Diagnosis not present

## 2017-01-14 DIAGNOSIS — C61 Malignant neoplasm of prostate: Secondary | ICD-10-CM | POA: Diagnosis not present

## 2017-01-14 DIAGNOSIS — Z01810 Encounter for preprocedural cardiovascular examination: Secondary | ICD-10-CM | POA: Insufficient documentation

## 2017-01-14 HISTORY — DX: Inflammatory liver disease, unspecified: K75.9

## 2017-01-14 HISTORY — DX: Malignant (primary) neoplasm, unspecified: C80.1

## 2017-01-14 LAB — CBC
HCT: 40.6 % (ref 39.0–52.0)
HEMOGLOBIN: 14 g/dL (ref 13.0–17.0)
MCH: 31.9 pg (ref 26.0–34.0)
MCHC: 34.5 g/dL (ref 30.0–36.0)
MCV: 92.5 fL (ref 78.0–100.0)
Platelets: 176 10*3/uL (ref 150–400)
RBC: 4.39 MIL/uL (ref 4.22–5.81)
RDW: 13 % (ref 11.5–15.5)
WBC: 5.9 10*3/uL (ref 4.0–10.5)

## 2017-01-14 LAB — BASIC METABOLIC PANEL
ANION GAP: 10 (ref 5–15)
BUN: 18 mg/dL (ref 6–20)
CALCIUM: 9.7 mg/dL (ref 8.9–10.3)
CO2: 28 mmol/L (ref 22–32)
Chloride: 101 mmol/L (ref 101–111)
Creatinine, Ser: 0.91 mg/dL (ref 0.61–1.24)
GFR calc Af Amer: >60 mL/min (ref >60)
GFR calc non Af Amer: >60 mL/min (ref >60)
GLUCOSE: 158 mg/dL — AB (ref 65–99)
Potassium: 3.8 mmol/L (ref 3.5–5.1)
Sodium: 139 mmol/L (ref 135–145)

## 2017-01-14 LAB — GLUCOSE, CAPILLARY: GLUCOSE-CAPILLARY: 205 mg/dL — AB (ref 65–99)

## 2017-01-14 NOTE — Progress Notes (Signed)
Echo 08/22/15 epic

## 2017-01-15 LAB — HEMOGLOBIN A1C
Hgb A1c MFr Bld: 6.7 % — ABNORMAL HIGH (ref 4.8–5.6)
Mean Plasma Glucose: 146 mg/dL

## 2017-01-16 ENCOUNTER — Other Ambulatory Visit: Payer: Self-pay

## 2017-01-16 NOTE — Patient Outreach (Signed)
[facility]) Care Management  01/16/2017  Rodney Hayes 02-03-1951 [id number]  Subjective: Telephone call to patient. Discussed [facility] pre-op call. Patient voices understanding and agrees to pre-op call.    Objective: Per chart review, patient with malignant neoplasm of prostate scheduled for Xl robotic assisted laparoscopic radical prostatectomy on 01/23/2017.  Assessment: Received [facility] pre-op referral on 01/06/17. Pre-Op call completed. Client reports he has completed his FMLA paperwork. He states he has not thought about short term disability and does not know if he chose the [place] as a benefit, but will follow up with the benefits office.Marland Kitchen RNCM provided benefits service office contact number.  Post procedure equipment/home health-RNCM reinforced that the inpatient case manager will arrange home health and home equipment needs prior to discharge.    RNCM discussed [place] benefit is higher when using a  facility/pharmacy. RNCM reinforced that if he is discharged home on the weekend with a new prescription, he will have to use a local pharmacy due to [facility] pharmacies are not open on the weekend.    Support:  Client reports that his wife will assist with pharmacy needs at discharge. Client reports he has transportation to his follow up appointment.     Discussed Advanced Directives. RNCM reinforced that Spiritual care department is available to assist [employer] employees with Advanced Directives as needed.  No other medical issues identified and no additional community resource information needs at this time.   Patient is agreeable to follow up post procedure call.  Plan: telephonic RNCM will follow up post discharge within 3 business days of notification.  Thea Silversmith, RN, MSN, [place] Coordinator Cell: [phone]

## 2017-01-22 ENCOUNTER — Encounter: Payer: Self-pay | Admitting: Pulmonary Disease

## 2017-01-22 MED ORDER — DEXTROSE 5 % IV SOLN
3.0000 g | INTRAVENOUS | Status: AC
  Administered 2017-01-23 (×2): 3 g via INTRAVENOUS
  Filled 2017-01-22: qty 3000
  Filled 2017-01-22: qty 3

## 2017-01-22 NOTE — H&P (Signed)
CC/HPI: CC: Prostate Cancer   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 He presented with a persistently elevated PSA of 5.14 prompting a TRUS biopsy of the prostate on 11/19/16. This confirmed Gleason 4+3=7 adenocarcinoma of the prostate with 3 out of 12 biopsy cores positive for malignancy. All positive cores were located at the left base/mid region of the prostate and this correlated with relative hypoechogenicity of the prostate in this region on ultrasound.   Family history: Unknown.   Imaging studies: None.   PMH: He has a history of diabetes, hypertension, sleep apnea, obesity, asthma, and arthritis.  PSH: Laparoscopic umbilical hernia repair.   TNM stage: cT1c Nx Mx  PSA: 5/14  Gleason score: 4+3=7  Biopsy (11/19/16): 3/12 cores positive  Left: L mid (5%, 4+3=7), L lateral base (80%, 4+3=7, PNI), L base (70%, 3+4=7)  Right: Benign  Prostate volume: 122.8 cc   Nomogram  OC disease: 44%  EPE: 53%  SVI: 5%  LNI: 6%  PFS (5 year, 10 year): 74%,60%   Urinary function: IPSS is 17. He takes Cardura 2 mg.  Erectile function: SHIM score is 20.     ALLERGIES: Erythromycin - Other Reaction, photophobia    MEDICATIONS: Cardura 2 mg tablet  Hydrochlorothiazide 25 mg tablet  Metformin Hcl 1,000 mg tablet  Cozaar 100 mg tablet  Norvasc 10 mg tablet  Onglyza 5 mg tablet     GU PSH: Prostate Needle Biopsy - 11/19/2016    NON-GU PSH: Knee replacement, Left Laparoscopy, Surgical; Repair Umbilical Hernia Repair Biceps Tendon, Left Shoulder Surgery (Unspecified), Left Surgical Pathology, Gross And Microscopic Examination For Prostate Needle - 11/19/2016    GU PMH: Elevated PSA - 11/08/2016    NON-GU PMH: Arthritis Asthma Diabetes Type 2 Hypertension Sleep Apnea    FAMILY HISTORY: 1 Daughter - Other Coronary Artery Disease - Father Death of family member - Mother, Father Prostate Cancer - Father Small cell lung cancer - Mother   SOCIAL  HISTORY: Marital Status: Married Current Smoking Status: Patient does not smoke anymore. Has not smoked since 10/13/1992. Smoked for 30 years. Smoked 1 pack per day.   Tobacco Use Assessment Completed: Used Tobacco in last 30 days? Drinks 2 drinks per day.  Does not drink caffeine. 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    REVIEW OF SYSTEMS:    GU Review Male:   Patient reports frequent urination, hard to postpone urination, leakage of urine, and trouble starting your streams. Patient denies burning/ pain with urination, get up at night to urinate, stream starts and stops, and have to strain to urinate .  Gastrointestinal (Upper):   Patient denies nausea and vomiting.  Gastrointestinal (Lower):   Patient denies diarrhea and constipation.  Constitutional:   Patient denies fever, night sweats, weight loss, and fatigue.  Skin:   Patient denies skin rash/ lesion and itching.  Eyes:   Patient denies blurred vision and double vision.  Ears/ Nose/ Throat:   Patient denies sore throat and sinus problems.  Hematologic/Lymphatic:   Patient denies swollen glands and easy bruising.  Cardiovascular:   Patient denies leg swelling and chest pains.  Respiratory:   Patient denies cough and shortness of breath.  Endocrine:   Patient denies excessive thirst.  Musculoskeletal:   Patient denies back pain and joint pain.  Neurological:   Patient denies headaches and dizziness.  Psychologic:   Patient denies depression and anxiety.   VITAL SIGNS:    Weight 297 lb /  134.72 kg  Height 71 in / 180.34 cm  BMI 41.4 kg/m   MULTI-SYSTEM PHYSICAL EXAMINATION:    Constitutional: Well-nourished. No physical deformities. Normally developed. Good grooming.  Neck: Neck symmetrical, not swollen. Normal tracheal position.  Respiratory: No labored breathing, no use of accessory muscles. Clear bilaterally.  Cardiovascular: Normal temperature, normal extremity pulses, no swelling, no varicosities. Regular rate and rhythm.   Gastrointestinal: Obese. He does have a small infraumbilical incision from his prior umbilical hernia repair.     ASSESSMENT:   1 GU:   Prostate Cancer - C61    PLAN:          1. Prostate cancer: We did discuss some of his increased risk related to his medical comorbidities and obesity (he weighs 297 pounds) but he is still sure he wants to proceed with surgery. He will be scheduled for a bilateral nerve sparing robot-assisted laparoscopic radical prostatectomy and bilateral pelvic lymphadenectomy. I discussed the potential benefits and risks of the procedure, side effects of the proposed treatment, the likelihood of the patient achieving the goals of the procedure, and any potential problems that might occur during the procedure or recuperation.

## 2017-01-22 NOTE — Telephone Encounter (Signed)
Rodney Hayes  I am having surgery 7/12. I'm on hctz and I don't think that will help with bladder control. Can we try something beside a diuretic.   Thanks  Rodney Hayes   Above is a Pharmacist, community message from the patient.   SN, please advise. Thanks!

## 2017-01-23 ENCOUNTER — Ambulatory Visit (HOSPITAL_COMMUNITY): Payer: 59 | Admitting: Certified Registered Nurse Anesthetist

## 2017-01-23 ENCOUNTER — Encounter (HOSPITAL_COMMUNITY)
Admission: RE | Disposition: A | Discharge status: Home / Self Care | Payer: Self-pay | Source: Ambulatory Visit | Attending: Urology

## 2017-01-23 ENCOUNTER — Observation Stay (HOSPITAL_COMMUNITY)
Admission: RE | Admit: 2017-01-23 | Discharge: 2017-01-24 | Disposition: A | Discharge status: Home / Self Care | Payer: 59 | Source: Ambulatory Visit | Attending: Urology | Admitting: Urology

## 2017-01-23 ENCOUNTER — Encounter (HOSPITAL_COMMUNITY): Payer: Self-pay | Admitting: Certified Registered Nurse Anesthetist

## 2017-01-23 DIAGNOSIS — M1990 Unspecified osteoarthritis, unspecified site: Secondary | ICD-10-CM | POA: Insufficient documentation

## 2017-01-23 DIAGNOSIS — Z96652 Presence of left artificial knee joint: Secondary | ICD-10-CM | POA: Insufficient documentation

## 2017-01-23 DIAGNOSIS — Z7984 Long term (current) use of oral hypoglycemic drugs: Secondary | ICD-10-CM | POA: Diagnosis not present

## 2017-01-23 DIAGNOSIS — Z9889 Other specified postprocedural states: Secondary | ICD-10-CM | POA: Insufficient documentation

## 2017-01-23 DIAGNOSIS — E1151 Type 2 diabetes mellitus with diabetic peripheral angiopathy without gangrene: Secondary | ICD-10-CM | POA: Insufficient documentation

## 2017-01-23 DIAGNOSIS — G4730 Sleep apnea, unspecified: Secondary | ICD-10-CM | POA: Diagnosis not present

## 2017-01-23 DIAGNOSIS — I10 Essential (primary) hypertension: Secondary | ICD-10-CM | POA: Insufficient documentation

## 2017-01-23 DIAGNOSIS — Z79899 Other long term (current) drug therapy: Secondary | ICD-10-CM | POA: Diagnosis not present

## 2017-01-23 DIAGNOSIS — Z801 Family history of malignant neoplasm of trachea, bronchus and lung: Secondary | ICD-10-CM | POA: Insufficient documentation

## 2017-01-23 DIAGNOSIS — Z8042 Family history of malignant neoplasm of prostate: Secondary | ICD-10-CM | POA: Diagnosis not present

## 2017-01-23 DIAGNOSIS — Z8249 Family history of ischemic heart disease and other diseases of the circulatory system: Secondary | ICD-10-CM | POA: Diagnosis not present

## 2017-01-23 DIAGNOSIS — Z6841 Body Mass Index (BMI) 40.0 and over, adult: Secondary | ICD-10-CM | POA: Diagnosis not present

## 2017-01-23 DIAGNOSIS — C61 Malignant neoplasm of prostate: Principal | ICD-10-CM | POA: Diagnosis present

## 2017-01-23 DIAGNOSIS — Z87891 Personal history of nicotine dependence: Secondary | ICD-10-CM | POA: Diagnosis not present

## 2017-01-23 DIAGNOSIS — E785 Hyperlipidemia, unspecified: Secondary | ICD-10-CM | POA: Diagnosis not present

## 2017-01-23 DIAGNOSIS — E119 Type 2 diabetes mellitus without complications: Secondary | ICD-10-CM | POA: Diagnosis not present

## 2017-01-23 DIAGNOSIS — J45909 Unspecified asthma, uncomplicated: Secondary | ICD-10-CM | POA: Diagnosis not present

## 2017-01-23 HISTORY — PX: ROBOT ASSISTED LAPAROSCOPIC RADICAL PROSTATECTOMY: SHX5141

## 2017-01-23 HISTORY — PX: LYMPHADENECTOMY: SHX5960

## 2017-01-23 LAB — TYPE AND SCREEN
ABO/RH(D): A POS
Antibody Screen: NEGATIVE

## 2017-01-23 LAB — GLUCOSE, CAPILLARY
GLUCOSE-CAPILLARY: 135 mg/dL — AB (ref 65–99)
GLUCOSE-CAPILLARY: 139 mg/dL — AB (ref 65–99)
GLUCOSE-CAPILLARY: 189 mg/dL — AB (ref 65–99)

## 2017-01-23 LAB — HEMOGLOBIN AND HEMATOCRIT, BLOOD
HEMATOCRIT: 41.8 % (ref 39.0–52.0)
HEMOGLOBIN: 14.4 g/dL (ref 13.0–17.0)

## 2017-01-23 SURGERY — XI ROBOTIC ASSISTED LAPAROSCOPIC RADICAL PROSTATECTOMY LEVEL 3
Anesthesia: General

## 2017-01-23 MED ORDER — MIDAZOLAM HCL 2 MG/2ML IJ SOLN
INTRAMUSCULAR | Status: AC
  Filled 2017-01-23: qty 2

## 2017-01-23 MED ORDER — ROCURONIUM BROMIDE 50 MG/5ML IV SOSY
PREFILLED_SYRINGE | INTRAVENOUS | Status: AC
  Filled 2017-01-23: qty 5

## 2017-01-23 MED ORDER — ONDANSETRON HCL 4 MG/2ML IJ SOLN
INTRAMUSCULAR | Status: AC
  Filled 2017-01-23: qty 4

## 2017-01-23 MED ORDER — HYDROMORPHONE HCL-NACL 0.5-0.9 MG/ML-% IV SOSY
PREFILLED_SYRINGE | INTRAVENOUS | Status: AC
  Administered 2017-01-23: 0.5 mg via INTRAVENOUS
  Filled 2017-01-23: qty 3

## 2017-01-23 MED ORDER — ACETAMINOPHEN 325 MG PO TABS
650.0000 mg | ORAL_TABLET | ORAL | Status: DC | PRN
  Administered 2017-01-24: 650 mg via ORAL
  Filled 2017-01-23: qty 2

## 2017-01-23 MED ORDER — LACTATED RINGERS IV SOLN
INTRAVENOUS | Status: DC | PRN
  Administered 2017-01-23: 16:00:00

## 2017-01-23 MED ORDER — MIDAZOLAM HCL 5 MG/5ML IJ SOLN
INTRAMUSCULAR | Status: DC | PRN
  Administered 2017-01-23: 2 mg via INTRAVENOUS

## 2017-01-23 MED ORDER — MEPERIDINE HCL 50 MG/ML IJ SOLN: 6.2500 mg | INTRAMUSCULAR | Status: DC | PRN

## 2017-01-23 MED ORDER — SUCCINYLCHOLINE CHLORIDE 200 MG/10ML IV SOSY
PREFILLED_SYRINGE | INTRAVENOUS | Status: AC
  Filled 2017-01-23: qty 10

## 2017-01-23 MED ORDER — BUPIVACAINE-EPINEPHRINE (PF) 0.25% -1:200000 IJ SOLN
INTRAMUSCULAR | Status: AC
  Filled 2017-01-23: qty 30

## 2017-01-23 MED ORDER — KCL IN DEXTROSE-NACL 20-5-0.45 MEQ/L-%-% IV SOLN
INTRAVENOUS | Status: DC
  Administered 2017-01-23 (×2): via INTRAVENOUS
  Filled 2017-01-23 (×3): qty 1000

## 2017-01-23 MED ORDER — ROCURONIUM BROMIDE 100 MG/10ML IV SOLN
INTRAVENOUS | Status: DC | PRN
  Administered 2017-01-23 (×4): 20 mg via INTRAVENOUS
  Administered 2017-01-23: 47 mg via INTRAVENOUS
  Administered 2017-01-23: 3 mg via INTRAVENOUS
  Administered 2017-01-23: 20 mg via INTRAVENOUS
  Administered 2017-01-23: 10 mg via INTRAVENOUS

## 2017-01-23 MED ORDER — SUGAMMADEX SODIUM 500 MG/5ML IV SOLN
INTRAVENOUS | Status: AC
  Filled 2017-01-23: qty 5

## 2017-01-23 MED ORDER — FENTANYL CITRATE (PF) 250 MCG/5ML IJ SOLN
INTRAMUSCULAR | Status: AC
  Filled 2017-01-23: qty 5

## 2017-01-23 MED ORDER — PROPOFOL 10 MG/ML IV BOLUS
INTRAVENOUS | Status: AC
  Filled 2017-01-23: qty 40

## 2017-01-23 MED ORDER — SUCCINYLCHOLINE CHLORIDE 20 MG/ML IJ SOLN
INTRAMUSCULAR | Status: DC | PRN
  Administered 2017-01-23: 140 mg via INTRAVENOUS

## 2017-01-23 MED ORDER — DOXAZOSIN MESYLATE 2 MG PO TABS
2.0000 mg | ORAL_TABLET | Freq: Two times a day (BID) | ORAL | Status: DC
  Administered 2017-01-23 – 2017-01-24 (×2): 2 mg via ORAL
  Filled 2017-01-23 (×2): qty 1

## 2017-01-23 MED ORDER — ONDANSETRON HCL 4 MG/2ML IJ SOLN
INTRAMUSCULAR | Status: DC | PRN
  Administered 2017-01-23: 8 mg via INTRAVENOUS

## 2017-01-23 MED ORDER — BUPIVACAINE-EPINEPHRINE 0.25% -1:200000 IJ SOLN
INTRAMUSCULAR | Status: DC | PRN
  Administered 2017-01-23: 30 mL

## 2017-01-23 MED ORDER — LIDOCAINE 2% (20 MG/ML) 5 ML SYRINGE
INTRAMUSCULAR | Status: AC
  Filled 2017-01-23: qty 5

## 2017-01-23 MED ORDER — OXYCODONE HCL 5 MG PO TABS: 5.0000 mg | ORAL_TABLET | Freq: Once | ORAL | Status: DC | PRN

## 2017-01-23 MED ORDER — FENTANYL CITRATE (PF) 100 MCG/2ML IJ SOLN
INTRAMUSCULAR | Status: AC
  Filled 2017-01-23: qty 2

## 2017-01-23 MED ORDER — SODIUM CHLORIDE 0.9 % IV BOLUS (SEPSIS)
1000.0000 mL | Freq: Once | INTRAVENOUS | Status: AC
  Administered 2017-01-23: 1000 mL via INTRAVENOUS

## 2017-01-23 MED ORDER — DOCUSATE SODIUM 100 MG PO CAPS
100.0000 mg | ORAL_CAPSULE | Freq: Two times a day (BID) | ORAL | Status: DC
  Administered 2017-01-23 – 2017-01-24 (×2): 100 mg via ORAL
  Filled 2017-01-23 (×2): qty 1

## 2017-01-23 MED ORDER — LACTATED RINGERS IV SOLN
INTRAVENOUS | Status: DC
  Administered 2017-01-23 (×2): via INTRAVENOUS

## 2017-01-23 MED ORDER — HYDROCHLOROTHIAZIDE 25 MG PO TABS
25.0000 mg | ORAL_TABLET | Freq: Every day | ORAL | Status: DC
  Administered 2017-01-24: 25 mg via ORAL
  Filled 2017-01-23: qty 1

## 2017-01-23 MED ORDER — PROMETHAZINE HCL 25 MG/ML IJ SOLN: 6.2500 mg | INTRAMUSCULAR | Status: DC | PRN

## 2017-01-23 MED ORDER — DIPHENHYDRAMINE HCL 12.5 MG/5ML PO ELIX: 12.5000 mg | ORAL_SOLUTION | Freq: Four times a day (QID) | ORAL | Status: DC | PRN

## 2017-01-23 MED ORDER — HYDROCODONE-ACETAMINOPHEN 5-325 MG PO TABS: 1.0000 | ORAL_TABLET | Freq: Four times a day (QID) | ORAL | 0 refills | Status: DC | PRN

## 2017-01-23 MED ORDER — OXYCODONE HCL 5 MG/5ML PO SOLN
5.0000 mg | Freq: Once | ORAL | Status: DC | PRN
  Filled 2017-01-23: qty 5

## 2017-01-23 MED ORDER — PROPOFOL 10 MG/ML IV BOLUS
INTRAVENOUS | Status: DC | PRN
  Administered 2017-01-23: 300 mg via INTRAVENOUS

## 2017-01-23 MED ORDER — SODIUM CHLORIDE 0.9 % IR SOLN
Status: DC | PRN
Start: 2017-01-23 — End: 2017-01-23
  Administered 2017-01-23: 1000 mL

## 2017-01-23 MED ORDER — FENTANYL CITRATE (PF) 250 MCG/5ML IJ SOLN
INTRAMUSCULAR | Status: DC | PRN
  Administered 2017-01-23 (×2): 100 ug via INTRAVENOUS
  Administered 2017-01-23 (×3): 50 ug via INTRAVENOUS
  Administered 2017-01-23: 100 ug via INTRAVENOUS

## 2017-01-23 MED ORDER — HEPARIN SODIUM (PORCINE) 5000 UNIT/ML IJ SOLN
5000.0000 [IU] | Freq: Three times a day (TID) | INTRAMUSCULAR | Status: DC
  Filled 2017-01-23: qty 1

## 2017-01-23 MED ORDER — HYDROMORPHONE HCL-NACL 0.5-0.9 MG/ML-% IV SOSY
PREFILLED_SYRINGE | INTRAVENOUS | Status: AC
  Administered 2017-01-23: 0.5 mg via INTRAVENOUS
  Filled 2017-01-23: qty 1

## 2017-01-23 MED ORDER — KETOROLAC TROMETHAMINE 15 MG/ML IJ SOLN
INTRAMUSCULAR | Status: AC
  Administered 2017-01-23: 15 mg via INTRAVENOUS
  Filled 2017-01-23: qty 1

## 2017-01-23 MED ORDER — DIPHENHYDRAMINE HCL 50 MG/ML IJ SOLN: 12.5000 mg | Freq: Four times a day (QID) | INTRAMUSCULAR | Status: DC | PRN

## 2017-01-23 MED ORDER — CEFAZOLIN SODIUM 10 G IJ SOLR
3.0000 g | INTRAMUSCULAR | Status: DC
  Filled 2017-01-23: qty 3000

## 2017-01-23 MED ORDER — CEFAZOLIN SODIUM-DEXTROSE 1-4 GM/50ML-% IV SOLN
1.0000 g | Freq: Three times a day (TID) | INTRAVENOUS | Status: AC
  Administered 2017-01-23 – 2017-01-24 (×2): 1 g via INTRAVENOUS
  Filled 2017-01-23 (×2): qty 50

## 2017-01-23 MED ORDER — SUGAMMADEX SODIUM 500 MG/5ML IV SOLN
INTRAVENOUS | Status: DC | PRN
  Administered 2017-01-23: 500 mg via INTRAVENOUS

## 2017-01-23 MED ORDER — HEPARIN SODIUM (PORCINE) 1000 UNIT/ML IJ SOLN
INTRAMUSCULAR | Status: AC
  Filled 2017-01-23: qty 1

## 2017-01-23 MED ORDER — MORPHINE SULFATE (PF) 4 MG/ML IV SOLN
2.0000 mg | INTRAVENOUS | Status: DC | PRN
  Administered 2017-01-23 (×3): 4 mg via INTRAVENOUS
  Filled 2017-01-23 (×3): qty 1

## 2017-01-23 MED ORDER — KETOROLAC TROMETHAMINE 15 MG/ML IJ SOLN
15.0000 mg | Freq: Four times a day (QID) | INTRAMUSCULAR | Status: DC
  Administered 2017-01-23 – 2017-01-24 (×4): 15 mg via INTRAVENOUS
  Filled 2017-01-23 (×3): qty 1

## 2017-01-23 MED ORDER — LIDOCAINE HCL (CARDIAC) 20 MG/ML IV SOLN
INTRAVENOUS | Status: DC | PRN
  Administered 2017-01-23: 100 mg via INTRATRACHEAL

## 2017-01-23 MED ORDER — KCL IN DEXTROSE-NACL 20-5-0.45 MEQ/L-%-% IV SOLN
INTRAVENOUS | Status: DC
  Filled 2017-01-23: qty 1000

## 2017-01-23 MED ORDER — HYDROMORPHONE HCL-NACL 0.5-0.9 MG/ML-% IV SOSY
0.2500 mg | PREFILLED_SYRINGE | INTRAVENOUS | Status: DC | PRN
  Administered 2017-01-23 (×4): 0.5 mg via INTRAVENOUS

## 2017-01-23 MED ORDER — SULFAMETHOXAZOLE-TRIMETHOPRIM 800-160 MG PO TABS: 1.0000 | ORAL_TABLET | Freq: Two times a day (BID) | ORAL | 0 refills | Status: DC

## 2017-01-23 MED ORDER — AMLODIPINE BESYLATE 10 MG PO TABS
10.0000 mg | ORAL_TABLET | Freq: Every morning | ORAL | Status: DC
  Administered 2017-01-24: 10 mg via ORAL
  Filled 2017-01-23: qty 1

## 2017-01-23 SURGICAL SUPPLY — 53 items
APPLICATOR COTTON TIP 6IN STRL (MISCELLANEOUS) ×3 IMPLANT
CATH FOLEY 2WAY SLVR 18FR 30CC (CATHETERS) ×3 IMPLANT
CATH ROBINSON RED A/P 16FR (CATHETERS) ×3 IMPLANT
CATH ROBINSON RED A/P 8FR (CATHETERS) ×3 IMPLANT
CATH TIEMANN FOLEY 18FR 5CC (CATHETERS) ×3 IMPLANT
CHLORAPREP W/TINT 26ML (MISCELLANEOUS) ×3 IMPLANT
CLIP LIGATING HEM O LOK PURPLE (MISCELLANEOUS) ×6 IMPLANT
CLIP SUT LAPRA TY ABSORB (SUTURE) ×3 IMPLANT
COVER SURGICAL LIGHT HANDLE (MISCELLANEOUS) ×3 IMPLANT
COVER TIP SHEARS 8 DVNC (MISCELLANEOUS) ×4 IMPLANT
COVER TIP SHEARS 8MM DA VINCI (MISCELLANEOUS) ×2
CUTTER ECHEON FLEX ENDO 45 340 (ENDOMECHANICALS) ×3 IMPLANT
DECANTER SPIKE VIAL GLASS SM (MISCELLANEOUS) ×3 IMPLANT
DERMABOND ADVANCED (GAUZE/BANDAGES/DRESSINGS)
DERMABOND ADVANCED .7 DNX12 (GAUZE/BANDAGES/DRESSINGS) IMPLANT
DRAPE ARM DVNC X/XI (DISPOSABLE) ×8 IMPLANT
DRAPE COLUMN DVNC XI (DISPOSABLE) ×2 IMPLANT
DRAPE DA VINCI XI ARM (DISPOSABLE) ×4
DRAPE DA VINCI XI COLUMN (DISPOSABLE) ×1
DRAPE SURG IRRIG POUCH 19X23 (DRAPES) ×3 IMPLANT
DRSG TEGADERM 4X4.75 (GAUZE/BANDAGES/DRESSINGS) ×3 IMPLANT
ELECT REM PT RETURN 15FT ADLT (MISCELLANEOUS) ×3 IMPLANT
GLOVE BIO SURGEON STRL SZ 6.5 (GLOVE) ×3 IMPLANT
GLOVE BIOGEL M STRL SZ7.5 (GLOVE) ×6 IMPLANT
GOWN STRL REUS W/TWL LRG LVL3 (GOWN DISPOSABLE) ×9 IMPLANT
HOLDER FOLEY CATH W/STRAP (MISCELLANEOUS) ×3 IMPLANT
IRRIG SUCT STRYKERFLOW 2 WTIP (MISCELLANEOUS) ×3
IRRIGATION SUCT STRKRFLW 2 WTP (MISCELLANEOUS) ×2 IMPLANT
IV LACTATED RINGERS 1000ML (IV SOLUTION) ×3 IMPLANT
NDL SAFETY ECLIPSE 18X1.5 (NEEDLE) ×2 IMPLANT
NEEDLE HYPO 18GX1.5 SHARP (NEEDLE) ×1
PACK ROBOT UROLOGY CUSTOM (CUSTOM PROCEDURE TRAY) ×3 IMPLANT
SEAL CANN UNIV 5-8 DVNC XI (MISCELLANEOUS) ×8 IMPLANT
SEAL XI 5MM-8MM UNIVERSAL (MISCELLANEOUS) ×4
SOLUTION ELECTROLUBE (MISCELLANEOUS) ×3 IMPLANT
STAPLE RELOAD 45 GRN (STAPLE) ×2 IMPLANT
STAPLE RELOAD 45MM GREEN (STAPLE) ×1
SUT ETHILON 3 0 PS 1 (SUTURE) ×3 IMPLANT
SUT MNCRL 3 0 RB1 (SUTURE) ×2 IMPLANT
SUT MNCRL 3 0 VIOLET RB1 (SUTURE) ×2 IMPLANT
SUT MNCRL AB 4-0 PS2 18 (SUTURE) ×6 IMPLANT
SUT MONOCRYL 3 0 RB1 (SUTURE) ×2
SUT VIC AB 0 CT1 27 (SUTURE) ×1
SUT VIC AB 0 CT1 27XBRD ANTBC (SUTURE) ×2 IMPLANT
SUT VIC AB 0 UR5 27 (SUTURE) ×3 IMPLANT
SUT VIC AB 2-0 SH 27 (SUTURE) ×2
SUT VIC AB 2-0 SH 27X BRD (SUTURE) ×4 IMPLANT
SUT VICRYL 0 UR6 27IN ABS (SUTURE) ×6 IMPLANT
SYR 27GX1/2 1ML LL SAFETY (SYRINGE) ×3 IMPLANT
TOWEL OR 17X26 10 PK STRL BLUE (TOWEL DISPOSABLE) ×3 IMPLANT
TOWEL OR NON WOVEN STRL DISP B (DISPOSABLE) ×3 IMPLANT
TUBING INSUFFLATION 10FT LAP (TUBING) IMPLANT
WATER STERILE IRR 1000ML POUR (IV SOLUTION) ×6 IMPLANT

## 2017-01-23 NOTE — Anesthesia Procedure Notes (Signed)
Procedure Name: Intubation Date/Time: 01/23/2017 11:29 AM Performed by: [place] Tenleigh Byer C Pre-anesthesia Checklist: Patient identified, Emergency Drugs available, Suction available and Patient being monitored Patient Re-evaluated:Patient Re-evaluated prior to induction Oxygen Delivery Method: Circle system utilized Preoxygenation: Pre-oxygenation with 100% oxygen Induction Type: IV induction Ventilation: Mask ventilation without difficulty Laryngoscope Size: Mac and 4 Grade View: Grade I Tube type: Oral Tube size: 7.5 mm Number of attempts: 1 Airway Equipment and Method: Stylet and Oral airway Placement Confirmation: ETT inserted through vocal cords under direct vision,  positive ETCO2 and breath sounds checked- equal and bilateral Secured at: 22 cm Tube secured with: Tape Dental Injury: Teeth and Oropharynx as per pre-operative assessment

## 2017-01-23 NOTE — Transfer of Care (Signed)
Immediate Anesthesia Transfer of Care Note  Patient: Rodney Hayes  Procedure(s) Performed: Procedure(s): XI ROBOTIC ASSISTED LAPAROSCOPIC RADICAL PROSTATECTOMY LEVEL 3 (N/A) BILATERAL LYMPHADENECTOMY (Bilateral)  Patient Location: PACU  Anesthesia Type:General  Level of Consciousness: awake, alert  and oriented  Airway & Oxygen Therapy: Patient Spontanous Breathing and Patient connected to face mask oxygen  Post-op Assessment: Report given to RN and Post -op Vital signs reviewed and stable  Post vital signs: Reviewed and stable  Last Vitals:  Vitals:   01/23/17 0942 01/23/17 1615  BP: 140/83   Pulse: 61 88  Resp: 18 (!) 21  Temp: 36.7 C 36.8 C    Last Pain:  Vitals:   01/23/17 0942  TempSrc: Oral      Patients Stated Pain Goal: 3 (50/15/86 8257)  Complications: No apparent anesthesia complications

## 2017-01-23 NOTE — Discharge Instructions (Signed)

## 2017-01-23 NOTE — Anesthesia Postprocedure Evaluation (Signed)
Anesthesia Post Note  Patient: [name]  Procedure(s) Performed: Procedure(s) (LRB): XI ROBOTIC ASSISTED LAPAROSCOPIC RADICAL PROSTATECTOMY LEVEL 3 (N/A) BILATERAL LYMPHADENECTOMY (Bilateral)     Patient location during evaluation: PACU Anesthesia Type: General Level of consciousness: awake and alert Pain management: pain level controlled Vital Signs Assessment: post-procedure vital signs reviewed and stable Respiratory status: spontaneous breathing, nonlabored ventilation and respiratory function stable Cardiovascular status: blood pressure returned to baseline and stable Postop Assessment: no signs of nausea or vomiting Anesthetic complications: no    Last Vitals:  Vitals:   01/23/17 1645 01/23/17 1700  BP: (!) 181/81 (!) 169/69  Pulse: 81 83  Resp: 17 17  Temp:      Last Pain:  Vitals:   01/23/17 1715  TempSrc:   PainSc: 2                  Lynda Rainwater

## 2017-01-23 NOTE — Anesthesia Preprocedure Evaluation (Signed)
Anesthesia Evaluation  Patient identified by MRN, date of birth, ID band Patient awake    Reviewed: Allergy & Precautions, NPO status , Patient's Chart, lab work & pertinent test results  Airway Mallampati: II   Neck ROM: full    Dental  (+) Teeth Intact, Implants, Dental Advidsory Given   Pulmonary shortness of breath, asthma , sleep apnea , former smoker,    breath sounds clear to auscultation       Cardiovascular hypertension, Pt. on medications + Peripheral Vascular Disease   Rhythm:regular Rate:Normal     Neuro/Psych    GI/Hepatic   Endo/Other  diabetes, Type 2Morbid obesity  Renal/GU      Musculoskeletal  (+) Arthritis ,   Abdominal   Peds  Hematology   Anesthesia Other Findings   Reproductive/Obstetrics                             Anesthesia Physical  Anesthesia Plan  ASA: III  Anesthesia Plan: General   Post-op Pain Management:    Induction: Intravenous  PONV Risk Score and Plan: 4 or greater and Ondansetron, Dexamethasone, Propofol, Midazolam and Treatment may vary due to age or medical condition  Airway Management Planned: Oral ETT  Additional Equipment:   Intra-op Plan:   Post-operative Plan: Extubation in OR  Informed Consent: I have reviewed the patients History and Physical, chart, labs and discussed the procedure including the risks, benefits and alternatives for the proposed anesthesia with the patient or authorized representative who has indicated his/her understanding and acceptance.   Dental Advisory Given  Plan Discussed with: CRNA, Anesthesiologist and Surgeon  Anesthesia Plan Comments:         Anesthesia Quick Evaluation

## 2017-01-23 NOTE — Progress Notes (Signed)
Patient ID: Rodney Hayes, male   DOB: 09-23-1950, 66 y.o.   MRN: [id number]  Post-op note  Subjective: The patient is doing well.  No complaints except for some rectal pressure.  Objective: Vital signs in last 24 hours: Temp:  [98.1 F (36.7 C)-98.9 F (37.2 C)] 98.2 F (36.8 C) (07/12 1615) Pulse Rate:  [61-88] 83 (07/12 1700) Resp:  [16-21] 17 (07/12 1700) BP: (140-181)/(69-93) 169/69 (07/12 1700) SpO2:  [94 %-98 %] 94 % (07/12 1700) Weight:  [137 kg (302 lb)] 137 kg (302 lb) (07/12 1002)  Intake/Output from previous day: No intake/output data recorded. Intake/Output this shift: Total I/O In: 1700 [I.V.:1700] Out: 75 [Blood:75]  Physical Exam:  General: Alert and oriented. Abdomen: Soft, Nondistended. Incisions: Clean and dry. GU: Urine clearing.  Lab Results:  Recent Labs  01/23/17 1641  HGB 14.4  HCT 41.8    Assessment/Plan: POD#0   1) Continue to monitor, ambulate and IS tonight   Roxy Horseman, Brooke Bonito. MD   LOS: 0 days   Mikko Lewellen,LES 01/23/2017, 5:12 PM

## 2017-01-23 NOTE — Op Note (Signed)
Preoperative diagnosis: Clinically localized adenocarcinoma of the prostate (clinical stage T1c Nx Mx)  Postoperative diagnosis: Clinically localized adenocarcinoma of the prostate (clinical stage T1c Nx Mx)  Procedure:  1. Robotic assisted laparoscopic radical prostatectomy (bilateral nerve sparing) 2. Bilateral robotic assisted laparoscopic pelvic lymphadenectomy  Surgeon: Pryor Curia. M.D.  Assistant: Debbrah Alar, PA-C  An assistant was required for this surgical procedure.  The duties of the assistant included but were not limited to suctioning, passing suture, camera manipulation, retraction. This procedure would not be able to be performed without an Environmental consultant.  Resident: Dr. Jonna Clark  Anesthesia: General  Complications: None  EBL: 75 mL  IVF:  1500 mL crystalloid  Specimens: 1. Prostate and seminal vesicles 2. Right pelvic lymph nodes 3. Left pelvic lymph nodes  Disposition of specimens: Pathology  Drains: 1. 20 Fr coude catheter 2. # 19 Blake pelvic drain  Indication: Rodney Hayes is a 66 y.o. year old patient with clinically localized prostate cancer.  After a thorough review of the management options for treatment of prostate cancer, he elected to proceed with surgical therapy and the above procedure(s).  We have discussed the potential benefits and risks of the procedure, side effects of the proposed treatment, the likelihood of the patient achieving the goals of the procedure, and any potential problems that might occur during the procedure or recuperation. Informed consent has been obtained.  Description of procedure:  The patient was taken to the operating room and a general anesthetic was administered. He was given preoperative antibiotics, placed in the dorsal lithotomy position, and prepped and draped in the usual sterile fashion. Next a preoperative timeout was performed. A urethral catheter was placed into the bladder and a site was  selected near the umbilicus for placement of the camera port.  This site was selected just inferior to the umbilicus based on the patient's large abdominal girth and prior mesh umbilical hernia repair. This was placed using a standard open Hassan technique which allowed entry into the peritoneal cavity under direct vision and without difficulty. An 8 mm robotic port was placed and a pneumoperitoneum established. The camera was then used to inspect the abdomen and there was no evidence of any intra-abdominal injuries or other abnormalities. The remaining abdominal ports were then placed. 8 mm robotic ports were placed in the right lower quadrant, left lower quadrant, and far left lateral abdominal wall. A 5 mm port was placed in the right upper quadrant and a 12 mm port was placed in the right lateral abdominal wall for laparoscopic assistance. All ports were placed under direct vision without difficulty. All robotic ports were long robotic ports due to the patient's obesity. The surgical cart was then docked.   Utilizing the cautery scissors, the bladder was reflected posteriorly allowing entry into the space of Retzius and identification of the endopelvic fascia and prostate. The periprostatic fat was then removed from the prostate allowing full exposure of the endopelvic fascia.  The patient was noted to have very large prostate (122 cc on preoperative ultrasound) and a narrow pelvis with an overhanging pubis making the working space in the pelvis extremely limited. The endopelvic fascia was then incised from the apex back to the base of the prostate bilaterally and the underlying levator muscle fibers were swept laterally off the prostate thereby isolating the dorsal venous complex. The dorsal vein was then stapled and divided with a 45 mm Flex Echelon stapler. Attention then turned to the bladder neck which was  divided anteriorly thereby allowing entry into the bladder and exposure of the urethral catheter.  The catheter balloon was deflated and the catheter was brought into the operative field and used to retract the prostate anteriorly. The posterior bladder neck was then examined and was divided allowing further dissection between the bladder and prostate posteriorly until the vasa deferentia and seminal vessels were identified. The vasa deferentia were isolated, divided, and lifted anteriorly. The seminal vesicles were dissected down to their tips with care to control the seminal vascular arterial blood supply. These structures were then lifted anteriorly and the space between Denonvillier's fascia and the anterior rectum was developed with a combination of sharp and blunt dissection. This isolated the vascular pedicles of the prostate.  The lateral prostatic fascia was then sharply incised allowing release of the neurovascular bundles bilaterally. The vascular pedicles of the prostate were then ligated with Weck clips between the prostate and neurovascular bundles and divided with sharp cold scissor dissection resulting in neurovascular bundle preservation. The neurovascular bundles were then separated off the apex of the prostate and urethra bilaterally.  The urethra was then sharply transected allowing the prostate specimen to be disarticulated. The pelvis was copiously irrigated and hemostasis was ensured. There was no evidence for rectal injury.  Attention then turned to the right pelvic sidewall. The fibrofatty tissue between the external iliac vein, confluence of the iliac vessels, hypogastric artery, and Cooper's ligament was dissected free from the pelvic sidewall with care to preserve the obturator nerve. Weck clips were used for lymphostasis and hemostasis. An identical procedure was performed on the contralateral side and the lymphatic packets were removed for permanent pathologic analysis.  Attention then turned to the urethral anastomosis. Again, the working space was extremely limited, and  despite multiple maneuvers to improve usability of the robotic arms, the ability to perform the urethral anastomosis was quite complex and challenging. A 2-0 Vicryl slip knot was placed between Denonvillier's fascia, the posterior bladder neck, and the posterior urethra to reapproximate these structures.  However, this was not able to be tied down adequately due to the limited room in the pelvis.  Therefore, a double-armed 3-0 Monocryl suture was then used to perform a 360 running tension-free anastomosis between the bladder neck and urethra.  This took approximately 50-55 minutes and was quite challenging and tedious. The anastomosis was unable to be tied together and instead Vicryl Lapra-Tys were used to secure the sutures from each side.  A new urethral catheter was then placed into the bladder and irrigated. There were no blood clots within the bladder and the anastomosis appeared to be watertight. A #19 Blake drain was then brought through the left lateral 8 mm port site and positioned appropriately within the pelvis. It was secured to the skin with a nylon suture. The surgical cart was then undocked. The right lateral 12 mm port site was closed at the fascial level with a 0 Vicryl suture placed laparoscopically. All remaining ports were then removed under direct vision. The prostate specimen was removed intact within the Endopouch retrieval bag via the periumbilical camera port site. This fascial opening was closed with two running 0 Vicryl sutures. 0.25% Marcaine was then injected into all port sites and all incisions were reapproximated at the skin level with 4-0 Monocryl subcuticular sutures and Dermabond. The patient appeared to tolerate the procedure well and without complications. The patient was able to be extubated and transferred to the recovery unit in satisfactory condition.  This procedure was quite  complex due to the patient's obesity, large size of the prostate (122cc) and narrow pelvis and  overhanging pubis.  For these reasons, this procedure took a much longer time to perform when compared to an average similar case.  In all, this procedure took more than 50% longer to perform based on the complex nature of the patient and anatomy.   Pryor Curia MD

## 2017-01-24 ENCOUNTER — Encounter (HOSPITAL_COMMUNITY): Payer: Self-pay | Admitting: Urology

## 2017-01-24 DIAGNOSIS — E1151 Type 2 diabetes mellitus with diabetic peripheral angiopathy without gangrene: Secondary | ICD-10-CM | POA: Diagnosis not present

## 2017-01-24 DIAGNOSIS — Z79899 Other long term (current) drug therapy: Secondary | ICD-10-CM | POA: Diagnosis not present

## 2017-01-24 DIAGNOSIS — J45909 Unspecified asthma, uncomplicated: Secondary | ICD-10-CM | POA: Diagnosis not present

## 2017-01-24 DIAGNOSIS — Z7984 Long term (current) use of oral hypoglycemic drugs: Secondary | ICD-10-CM | POA: Diagnosis not present

## 2017-01-24 DIAGNOSIS — Z6841 Body Mass Index (BMI) 40.0 and over, adult: Secondary | ICD-10-CM | POA: Diagnosis not present

## 2017-01-24 DIAGNOSIS — G4730 Sleep apnea, unspecified: Secondary | ICD-10-CM | POA: Diagnosis not present

## 2017-01-24 DIAGNOSIS — I10 Essential (primary) hypertension: Secondary | ICD-10-CM | POA: Diagnosis not present

## 2017-01-24 DIAGNOSIS — C61 Malignant neoplasm of prostate: Secondary | ICD-10-CM | POA: Diagnosis not present

## 2017-01-24 LAB — HEMOGLOBIN AND HEMATOCRIT, BLOOD
HCT: 38 % — ABNORMAL LOW (ref 39.0–52.0)
Hemoglobin: 12.9 g/dL — ABNORMAL LOW (ref 13.0–17.0)

## 2017-01-24 LAB — GLUCOSE, CAPILLARY
GLUCOSE-CAPILLARY: 134 mg/dL — AB (ref 65–99)
Glucose-Capillary: 176 mg/dL — ABNORMAL HIGH (ref 65–99)

## 2017-01-24 MED ORDER — HYDROCODONE-ACETAMINOPHEN 5-325 MG PO TABS
1.0000 | ORAL_TABLET | Freq: Four times a day (QID) | ORAL | Status: DC | PRN
Start: 2017-01-24 — End: 2017-01-24
  Administered 2017-01-24: 2 via ORAL
  Filled 2017-01-24 (×2): qty 2

## 2017-01-24 MED ORDER — INSULIN ASPART 100 UNIT/ML ~~LOC~~ SOLN
0.0000 [IU] | SUBCUTANEOUS | Status: DC
  Administered 2017-01-24: 3 [IU] via SUBCUTANEOUS

## 2017-01-24 MED ORDER — BISACODYL 10 MG RE SUPP
10.0000 mg | Freq: Once | RECTAL | Status: AC
  Administered 2017-01-24: 10 mg via RECTAL
  Filled 2017-01-24: qty 1

## 2017-01-24 MED FILL — HYDROCODON-APAP 5-325: 5-325 | 4 days supply | Qty: 30 | Fill #0

## 2017-01-24 MED FILL — SULFAMETHOXAZOLE/TMP DS TAB: 800-160 | 3 days supply | Qty: 6 | Fill #0

## 2017-01-24 NOTE — Care Management Note (Signed)
Case Management Note  Patient Details  Name: Rodney Hayes MRN: [id number] Date of Birth: 21-Mar-1951  Subjective/Objective:                    Action/Plan:d/c home.   Expected Discharge Date:  01/24/17               Expected Discharge Plan:  Home/Self Care  In-House Referral:     Discharge planning Services  CM Consult  Post Acute Care Choice:    Choice offered to:     DME Arranged:    DME Agency:     HH Arranged:    HH Agency:     Status of Service:  Completed, signed off  If discussed at [employer] of Stay Meetings, dates discussed:    Additional Comments:  Dessa Phi, RN 01/24/2017, 1:09 PM

## 2017-01-24 NOTE — Progress Notes (Signed)
UROLOGY PROGRESS NOTES  Assessment:  Rodney Hayes is a 66 y.o. male with a history of  Past Medical History:  Diagnosis Date  . Arthritis   . Asthma    as a child  . BPH (benign prostatic hyperplasia)   . Cancer [facility])    prostate  . Diabetes mellitus    average fasting 140s type 2  . DJD (degenerative joint disease)   . Heart murmur    [name]  . Hepatitis    hep B  . Hyperlipidemia   . Hypertension   . Nonspecific abnormal electrocardiogram (ECG) (EKG)   . Obesity   . Shortness of breath dyspnea    exertion  . Sleep apnea    C-PAP   who is s/p RALP on 7/12 for prostate cancer .   Interval: Did well overnight. No nausea/vomiting. Not yet passing gas. Ambulating. Tolerating catheter well. Pain well controlled.    Plan:   Neuro: - pain control with sch toradol, tylenol, PRN morphine  Respiratory: SORA  CV: HDS - continue home meds   FEN/GI: - medlock, continue clears PO  - bowel regimen   - PRN zofran   GU: - monitor UOP -  Foley catheter in place  Endocrine: - ISS  Heme/ID: - afebrile, stable - Hb 12.9 from 14.4, continue to monitor  PPx: OOB/IS/ SQH  Dispo: likely home today with foley   Jonna Clark, MD Urologic Surgery Resident  -------   Objective:  Vital signs in last 24 hours: Temp:  [98.1 F (36.7 C)-99.1 F (37.3 C)] 99 F (37.2 C) (07/13 0504) Pulse Rate:  [61-92] 73 (07/13 0504) Resp:  [16-21] 18 (07/13 0504) BP: (140-184)/(50-93) 184/75 (07/13 0504) SpO2:  [93 %-98 %] 93 % (07/13 0504) Weight:  [137 kg (302 lb)] 137 kg (302 lb) (07/12 1002)  07/12 0701 - 07/13 0700 In: 5873.8 [P.O.:420; I.V.:4403.8; IV Piggyback:1050] Out: 1885 [CWCBJ:6283; Drains:170; Blood:75]   Physical Exam:  General:  well-developed and well-nourished  in NAD, lying in bed, alert & oriented, pleasant HEENT: [place]/AT, EOMI, sclera anicteric, hearing grossly intact, no nasal discharge, MMM Respiratory: nonlabored respirations, satting well  on RA, symmetrical chest rise Cardiovascular: appropriate perfusion  Abdominal: soft, NTTP, nondistended, surgical incisions c/d/i without signs of exudate/erythema, dermabond in place, JP in place draining SS GU: foley catheter draining pink tinged urine  Extremities: warm, well-perfused, no c/c/e Neuro: no focal deficits  Data Review: Results for orders placed or performed during the hospital encounter of 01/23/17 (from the past 24 hour(s))  Glucose, capillary     Status: Abnormal   Collection Time: 01/23/17  9:33 AM  Result Value Ref Range   Glucose-Capillary 135 (H) 65 - 99 mg/dL   Comment 1 Notify RN    Comment 2 Document in Chart   Glucose, capillary     Status: Abnormal   Collection Time: 01/23/17  4:16 PM  Result Value Ref Range   Glucose-Capillary 139 (H) 65 - 99 mg/dL  Hemoglobin and hematocrit, blood     Status: None   Collection Time: 01/23/17  4:41 PM  Result Value Ref Range   Hemoglobin 14.4 13.0 - 17.0 g/dL   HCT 41.8 39.0 - 52.0 %  Glucose, capillary     Status: Abnormal   Collection Time: 01/23/17 10:48 PM  Result Value Ref Range   Glucose-Capillary 189 (H) 65 - 99 mg/dL  Hemoglobin and hematocrit, blood     Status: Abnormal   Collection Time: 01/24/17  5:23  AM  Result Value Ref Range   Hemoglobin 12.9 (L) 13.0 - 17.0 g/dL   HCT 38.0 (L) 39.0 - 52.0 %

## 2017-01-24 NOTE — Progress Notes (Signed)
Patient ID: Rodney Hayes, male   DOB: Nov 21, 1950, 66 y.o.   MRN: [id number]  1 Day Post-Op Subjective: The patient is doing well.  No nausea or vomiting. Pain is adequately controlled.  Objective: Vital signs in last 24 hours: Temp:  [98.1 F (36.7 C)-99.1 F (37.3 C)] 99 F (37.2 C) (07/13 0504) Pulse Rate:  [61-92] 73 (07/13 0504) Resp:  [16-21] 18 (07/13 0504) BP: (140-184)/(50-93) 184/75 (07/13 0504) SpO2:  [93 %-98 %] 93 % (07/13 0504) Weight:  [137 kg (302 lb)] 137 kg (302 lb) (07/12 1002)  Intake/Output from previous day: 07/12 0701 - 07/13 0700 In: 5873.8 [P.O.:420; I.V.:4403.8; IV Piggyback:1050] Out: 1885 [ZCHYI:5027; Drains:170; Blood:75] Intake/Output this shift: No intake/output data recorded.  Physical Exam:  General: Alert and oriented. CV: RRR Lungs: Clear bilaterally. GI: Soft, Nondistended. Incisions: Clean, dry, and intact Urine: Clear Extremities: Nontender, no erythema, no edema.  Lab Results:  Recent Labs  01/23/17 1641 01/24/17 0523  HGB 14.4 12.9*  HCT 41.8 38.0*      Assessment/Plan: POD# 1 s/p robotic prostatectomy.  1) SL IVF 2) Ambulate, Incentive spirometry 3) Transition to oral pain medication 4) Dulcolax suppository 5) D/C pelvic drain 6) Plan for likely discharge later today   Pryor Curia. MD   LOS: 0 days   Antoinetta Berrones,LES 01/24/2017, 8:09 AM

## 2017-01-24 NOTE — Discharge Summary (Signed)
  Date of admission: 01/23/2017  Date of discharge: 01/24/2017  Admission diagnosis: Prostate Cancer  Discharge diagnosis: Prostate Cancer  History and Physical: For full details, please see admission history and physical. Briefly, Rodney Hayes is a 66 y.o. gentleman with localized prostate cancer.  After discussing management/treatment options, he elected to proceed with surgical treatment.  Hospital Course: Rodney Hayes was taken to the operating room on 01/23/2017 and underwent a robotic assisted laparoscopic radical prostatectomy. He tolerated this procedure well and without complications. Postoperatively, he was able to be transferred to a regular hospital room following recovery from anesthesia.  He was able to begin ambulating the night of surgery. He remained hemodynamically stable overnight.  He had excellent urine output with appropriately minimal output from his pelvic drain and his pelvic drain was removed on POD #1.  He was transitioned to oral pain medication, tolerated a clear liquid diet, and had met all discharge criteria and was able to be discharged home later on POD#1.  Laboratory values:  Recent Labs  01/23/17 1641 01/24/17 0523  HGB 14.4 12.9*  HCT 41.8 38.0*    Disposition: Home  Discharge instruction: He was instructed to be ambulatory but to refrain from heavy lifting, strenuous activity, or driving. He was instructed on urethral catheter care.  Discharge medications:  Allergies as of 01/24/2017      Reactions   Cymbalta [duloxetine Hcl]    As per e-mail 12/13/11: Insomnia, lethargy, inability to ejaculate   Tetracycline    REACTION: PHOTOSENSITIVITY      Medication List    STOP taking these medications   ALEVE 220 MG tablet Generic drug:  naproxen sodium   aspirin EC 81 MG tablet   b complex vitamins tablet   cholecalciferol 1000 units tablet Commonly known as:  VITAMIN D   vitamin E 400 UNIT capsule     TAKE these medications   ACCU-CHEK  FASTCLIX LANCET Kit Use as directed   amLODipine 10 MG tablet Commonly known as:  NORVASC Take 1 tablet (10 mg total) by mouth every morning.   canagliflozin 100 MG Tabs tablet Commonly known as:  INVOKANA Take 1 tablet (100 mg total) by mouth daily before breakfast.   doxazosin 2 MG tablet Commonly known as:  CARDURA Take 1 tablet (2 mg total) by mouth 2 (two) times daily.   glucose blood test strip Commonly known as:  ACCU-CHEK GUIDE Use as instructed   hydrochlorothiazide 25 MG tablet Commonly known as:  HYDRODIURIL Take 1 tablet (25 mg total) by mouth daily.   HYDROcodone-acetaminophen 5-325 MG tablet Commonly known as:  NORCO Take 1-2 tablets by mouth every 6 (six) hours as needed for moderate pain or severe pain.   liraglutide 18 MG/3ML Sopn Commonly known as:  VICTOZA Inject 0.3 mLs (1.8 mg total) into the skin daily.   losartan 100 MG tablet Commonly known as:  COZAAR Take 1 tablet (100 mg total) by mouth daily.   metFORMIN 500 MG tablet Commonly known as:  GLUCOPHAGE TAKE 2 TABLETS BY MOUTH TWICE DAILY AFTER 2 LARGEST MEALS   sulfamethoxazole-trimethoprim 800-160 MG tablet Commonly known as:  BACTRIM DS,SEPTRA DS Take 1 tablet by mouth 2 (two) times daily. Start the day prior to foley removal appointment       Followup: He will followup in 1 week for catheter removal and to discuss his surgical pathology results.

## 2017-01-28 MED FILL — SILDENAFIL 20 MG TABLET: 20 | 30 days supply | Qty: 90 | Fill #0

## 2017-02-01 DIAGNOSIS — G4733 Obstructive sleep apnea (adult) (pediatric): Secondary | ICD-10-CM | POA: Diagnosis not present

## 2017-02-10 MED FILL — ACCU-CHEK GUIDE TEST STRIP: 90 days supply | Qty: 100 | Fill #0

## 2017-02-17 MED FILL — UNIFINE PENTIPS 31GX3/16": 31G X 5 MM | 90 days supply | Qty: 100 | Fill #2

## 2017-02-17 MED FILL — VICTOZA 18 MG/3 ML INJECT P: 18 | 90 days supply | Qty: 27 | Fill #2

## 2017-02-17 MED FILL — INVOKANA 100 MG TABLET: 100 | 30 days supply | Qty: 30 | Fill #3

## 2017-02-17 MED FILL — UNIFINE PENTIPS 31GX3/16: 31G X 5 MM | 90 days supply | Qty: 100 | Fill #2

## 2017-02-21 DIAGNOSIS — M62838 Other muscle spasm: Secondary | ICD-10-CM | POA: Diagnosis not present

## 2017-02-21 DIAGNOSIS — N393 Stress incontinence (female) (male): Secondary | ICD-10-CM | POA: Diagnosis not present

## 2017-02-21 DIAGNOSIS — M6281 Muscle weakness (generalized): Secondary | ICD-10-CM | POA: Diagnosis not present

## 2017-03-04 DIAGNOSIS — G4733 Obstructive sleep apnea (adult) (pediatric): Secondary | ICD-10-CM | POA: Diagnosis not present

## 2017-03-07 ENCOUNTER — Ambulatory Visit (INDEPENDENT_AMBULATORY_CARE_PROVIDER_SITE_OTHER): Payer: 59 | Admitting: Internal Medicine

## 2017-03-07 ENCOUNTER — Encounter: Payer: Self-pay | Admitting: Internal Medicine

## 2017-03-07 VITALS — BP 142/80 | HR 81 | Wt 302.0 lb

## 2017-03-07 DIAGNOSIS — Z6841 Body Mass Index (BMI) 40.0 and over, adult: Secondary | ICD-10-CM | POA: Diagnosis not present

## 2017-03-07 DIAGNOSIS — E1165 Type 2 diabetes mellitus with hyperglycemia: Secondary | ICD-10-CM

## 2017-03-07 MED ORDER — LIRAGLUTIDE 18 MG/3ML ~~LOC~~ SOPN: 1.8000 mg | PEN_INJECTOR | Freq: Every day | SUBCUTANEOUS | 3 refills | Status: DC

## 2017-03-07 NOTE — Patient Instructions (Addendum)
Please continue: - Metformin 1000 mg 2x a day with meals - Victoza 1.8 mg daily in am - Invokana 100 mg daily in am  Please return in 3-4 months with your sugar log.

## 2017-03-07 NOTE — Progress Notes (Signed)
Patient ID: Rodney Hayes, male   DOB: Aug 29, 1950, 66 y.o.   MRN: [id number]   HPI: Rodney Hayes is a 66 y.o.-year-old male, returning for f/u for DM2, dx in 2011, non-insulin-dependent, uncontrolled, without long term complications. Last visit 3.5 mo ago.  He had robotic radical prostatectomy last month for Gleason 7 PrCA, limited to the prostate. He is feeling well, has recovered after the surgery. Before the surgery, he had a new HbA1c checked, and this has improved.  Last hemoglobin A1c was: Lab Results  Component Value Date   HGBA1C 6.7 (H) 01/14/2017   HGBA1C 7.3 11/14/2016   HGBA1C 9.9 (H) 08/16/2016   Pt is on a regimen of: - Metformin 1000 mg 2x a day with meals - Victoza 1.8 mg daily in am - Invokana 100 mg daily in am  - started 09/2016 Previously on Kombiglyze 2.11-998 mg daily   Pt checks his sugars 1x a day - in am: - am: 149-200, 222 >> 111-154, 169 >> 121-150, 158 - 2h after b'fast: n/c >> 104, 210 >> 197 - before lunch: n/c  - 2h after lunch: n/c - before dinner: n/c >> 154 - 2h after dinner: n/c >> 153 - bedtime: n/c - nighttime: n/c No lows. Lowest sugar was 124 >> 104 >> 121; he has hypoglycemia awareness at 70.  Highest sugar was 330 >> 210 >> 197  Glucometer: AccuChek  Pt's meals are: - Breakfast:  Bacon, 2 bisquits, fruit - Lunch: hamburger, fries - Dinner: chicken, salad - Snacks: 4, nuts, candy, cottage cheese He continues to work out at a gym 3x a week: cardio, weight training - works with a Clinical research associate twice a week >> paused now with his sx  No CKD: Lab Results  Component Value Date   BUN 18 01/14/2017   Lab Results  Component Value Date   CREATININE 0.91 01/14/2017  On Losartan. - last set of lipids: Lab Results  Component Value Date   CHOL 166 08/16/2016   HDL 44.00 08/16/2016   LDLCALC 70 08/09/2015   LDLDIRECT 89.0 08/16/2016   TRIG 205.0 (H) 08/16/2016   CHOLHDL 4 08/16/2016  He cannot use statins >> joint pain. - last eye  exam was in 05/2016 >> No DR. + cataract. - he denies numbness and tingling in his feet.  He also has a history of HTN, OSA, elevated PSA.  ROS: Constitutional: + weight gain/+ weight loss, no fatigue, no subjective hyperthermia, no subjective hypothermia Eyes: no blurry vision, no xerophthalmia ENT: no sore throat, no nodules palpated in throat, no dysphagia, no odynophagia, no hoarseness Cardiovascular: no CP/no SOB/no palpitations/no leg swelling Respiratory: no cough/no SOB/no wheezing Gastrointestinal: no N/no V/no D/no C/no acid reflux Musculoskeletal: no muscle aches/no joint aches Skin: no rashes, no hair loss Neurological: no tremors/no numbness/no tingling/no dizziness  I reviewed pt's medications, allergies, PMH, social hx, family hx, and changes were documented in the history of present illness. Otherwise, unchanged from my initial visit note.  Past Medical History:  Diagnosis Date  . Arthritis   . Asthma    as a child  . BPH (benign prostatic hyperplasia)   . Cancer [facility])    prostate  . Diabetes mellitus    average fasting 140s type 2  . DJD (degenerative joint disease)   . Heart murmur    [name]  . Hepatitis    hep B  . Hyperlipidemia   . Hypertension   . Nonspecific abnormal electrocardiogram (ECG) (EKG)   .  Obesity   . Shortness of breath dyspnea    exertion  . Sleep apnea    C-PAP   Past Surgical History:  Procedure Laterality Date  . Biceps tendon reinsertion    . colonoscopy with polypectomy     adenomatous; [name]  . JOINT REPLACEMENT Left    knee  . left knee arthroscopy    . LYMPHADENECTOMY Bilateral 01/23/2017   Procedure: BILATERAL LYMPHADENECTOMY;  Surgeon: [name], MD;  Location: [facility];  Service: Urology;  Laterality: Bilateral;  . PROSTATECTOMY     01/23/17 [name]  . ROBOT ASSISTED LAPAROSCOPIC RADICAL PROSTATECTOMY N/A 01/23/2017   Procedure: XI ROBOTIC ASSISTED LAPAROSCOPIC RADICAL PROSTATECTOMY LEVEL 3;  Surgeon:  [name], MD;  Location: [facility];  Service: Urology;  Laterality: N/A;  . TONSILLECTOMY AND ADENOIDECTOMY    . TOTAL KNEE ARTHROPLASTY Left 01/11/2013   Procedure: LEFT TOTAL KNEE ARTHROPLASTY;  Surgeon: [name], MD;  Location: [facility];  Service: Orthopedics;  Laterality: Left;  . TOTAL SHOULDER ARTHROPLASTY Left 08/12/2014   Procedure: LEFT TOTAL SHOULDER ARTHROPLASTY;  Surgeon: [name], MD;  Location: [facility];  Service: Orthopedics;  Laterality: Left;  . UMBILICAL HERNIA REPAIR     Social History   Social History  . Marital status: Married    Spouse name: N/A  . Number of children: N/A  . Years of education: N/A   Occupational History  . Not on file.   Social History Main Topics  . Smoking status: Former Smoker    Packs/day: 2.00    Years: 30.00    Types: Cigarettes    Start date: 07/15/1962    Quit date: 08/15/1992  . Smokeless tobacco: Never Used     Comment: smoked 14 -42 up to 2 ppd  . Alcohol use 2.4 oz/week    4 Glasses of wine per week     Comment: Red Wine  . Drug use: No  . Sexual activity: Yes   Other Topics Concern  . Not on file   Social History Narrative  . No narrative on file   Current Outpatient Prescriptions on File Prior to Visit  Medication Sig Dispense Refill  . amLODipine (NORVASC) 10 MG tablet Take 1 tablet (10 mg total) by mouth every morning. 90 tablet 3  . canagliflozin (INVOKANA) 100 MG TABS tablet Take 1 tablet (100 mg total) by mouth daily before breakfast. 90 tablet 3  . doxazosin (CARDURA) 2 MG tablet Take 1 tablet (2 mg total) by mouth 2 (two) times daily. 180 tablet 3  . glucose blood (ACCU-CHEK GUIDE) test strip Use as instructed 100 each 12  . hydrochlorothiazide (HYDRODIURIL) 25 MG tablet Take 1 tablet (25 mg total) by mouth daily. 90 tablet 3  . Lancets Misc. (ACCU-CHEK FASTCLIX LANCET) KIT Use as directed 1 kit 5  . liraglutide (VICTOZA) 18 MG/3ML SOPN Inject 0.3 mLs (1.8 mg total) into the skin daily. 3 pen 3  .  losartan (COZAAR) 100 MG tablet Take 1 tablet (100 mg total) by mouth daily. 90 tablet 3  . metFORMIN (GLUCOPHAGE) 500 MG tablet TAKE 2 TABLETS BY MOUTH TWICE DAILY AFTER 2 LARGEST MEALS 360 tablet 3   No current facility-administered medications on file prior to visit.    Allergies  Allergen Reactions  . Cymbalta [Duloxetine Hcl]     As per e-mail 12/13/11: Insomnia, lethargy, inability to ejaculate  . Tetracycline     REACTION: PHOTOSENSITIVITY   Family History  Problem Relation Age of  Onset  . Lung cancer Mother        smoker  . Diabetes Father   . Coronary artery disease Father        CBAG in late 64s  . Stroke Neg Hx    PE: BP (!) 142/80 (BP Location: Left Arm, Patient Position: Sitting)   Pulse 81   Wt (!) 302 lb (137 kg)   SpO2 95%   BMI 42.72 kg/m  Wt Readings from Last 3 Encounters:  03/07/17 (!) 302 lb (137 kg)  01/23/17 (!) 302 lb (137 kg)  01/14/17 (!) 302 lb (137 kg)   Constitutional: obese, in NAD Eyes: PERRLA, EOMI, no exophthalmos ENT: moist mucous membranes, no thyromegaly, no cervical lymphadenopathy Cardiovascular: RRR, No MRG Respiratory: CTA B Gastrointestinal: abdomen soft, NT, ND, BS+ Musculoskeletal: no deformities, strength intact in all 4 Skin: moist, warm, no rashes Neurological: no tremor with outstretched hands, DTR normal in all 4  ASSESSMENT: 1. DM2, non-insulin-dependent, uncontrolled, without long term complications, but with hyperglycaemia  2. Obesity class 3  PLAN:  1. Patient with long-standing, uncontrolled diabetes, on oral antidiabetic regimen and GLP-1 receptor agonist, with improvement in his blood sugars since last visit. His sugars started to improve after we started [facility], which he tolerates very well. He does have increased urination as he is also on HCTZ >> advised to take Invokana in am and HCTZ after lunch. - He also started to change his diet, which also helped, but after the surgery >> eating more and not exercising  >> gained weight. - We reviewed together his latest HbA1c, which was excellent, at 6.7% on 01/14/2017. No changes are needed in his medications for now - I suggested to:  Patient Instructions  Please continue: - Metformin 1000 mg 2x a day with meals - Victoza 1.8 mg daily in am - Invokana 100 mg daily in am  Please return in 3-4 months with your sugar log.   - continue checking sugars at different times of the day - check 1x a day, rotating checks - advised for yearly eye exams >> he is UTD - Return to clinic in 3-4 mo with sugar log   2. Obesity class 3 - he did lose wight before his prostate surgery, but he was unable to go to the gym after the surgery >> gained weight - he is returning to work next week and then slowly will start getting back to his exercise routine  Philemon Kingdom, MD PhD [facility] Endocrinology

## 2017-03-10 DIAGNOSIS — N393 Stress incontinence (female) (male): Secondary | ICD-10-CM | POA: Diagnosis not present

## 2017-03-10 DIAGNOSIS — M62838 Other muscle spasm: Secondary | ICD-10-CM | POA: Diagnosis not present

## 2017-03-10 DIAGNOSIS — M6281 Muscle weakness (generalized): Secondary | ICD-10-CM | POA: Diagnosis not present

## 2017-03-18 MED FILL — DOXAZOSIN MESYLATE 2 MG TAB: 2 | 90 days supply | Qty: 180 | Fill #2

## 2017-03-18 MED FILL — AMLODIPINE BESYLATE 10 MG T: 10 | 90 days supply | Qty: 90 | Fill #2

## 2017-03-18 MED FILL — LOSARTAN POTASSIUM 100 MG T: 100 | 90 days supply | Qty: 90 | Fill #2

## 2017-03-18 MED FILL — metFORMIN HCL 500 MG TABS: 500 | 90 days supply | Qty: 360 | Fill #2

## 2017-03-20 DIAGNOSIS — G4733 Obstructive sleep apnea (adult) (pediatric): Secondary | ICD-10-CM | POA: Diagnosis not present

## 2017-03-31 MED FILL — INVOKANA 100 MG TABLET: 100 | 90 days supply | Qty: 90 | Fill #0

## 2017-04-04 DIAGNOSIS — G4733 Obstructive sleep apnea (adult) (pediatric): Secondary | ICD-10-CM | POA: Diagnosis not present

## 2017-04-24 DIAGNOSIS — C61 Malignant neoplasm of prostate: Secondary | ICD-10-CM | POA: Diagnosis not present

## 2017-05-01 DIAGNOSIS — N3941 Urge incontinence: Secondary | ICD-10-CM | POA: Diagnosis not present

## 2017-05-01 DIAGNOSIS — N5231 Erectile dysfunction following radical prostatectomy: Secondary | ICD-10-CM | POA: Diagnosis not present

## 2017-05-01 DIAGNOSIS — C61 Malignant neoplasm of prostate: Secondary | ICD-10-CM | POA: Diagnosis not present

## 2017-05-04 DIAGNOSIS — G4733 Obstructive sleep apnea (adult) (pediatric): Secondary | ICD-10-CM | POA: Diagnosis not present

## 2017-05-19 MED FILL — ACCU-CHEK GUIDE TEST STRIP: 90 days supply | Qty: 100 | Fill #1

## 2017-05-19 MED FILL — VICTOZA 18 MG/3 ML INJECT P: 18 | 90 days supply | Qty: 27 | Fill #3

## 2017-06-11 DIAGNOSIS — H2513 Age-related nuclear cataract, bilateral: Secondary | ICD-10-CM | POA: Diagnosis not present

## 2017-06-11 DIAGNOSIS — H25013 Cortical age-related cataract, bilateral: Secondary | ICD-10-CM | POA: Diagnosis not present

## 2017-06-11 DIAGNOSIS — H35033 Hypertensive retinopathy, bilateral: Secondary | ICD-10-CM | POA: Diagnosis not present

## 2017-06-11 DIAGNOSIS — E119 Type 2 diabetes mellitus without complications: Secondary | ICD-10-CM | POA: Diagnosis not present

## 2017-06-11 LAB — HM DIABETES EYE EXAM

## 2017-06-12 DIAGNOSIS — C61 Malignant neoplasm of prostate: Secondary | ICD-10-CM | POA: Diagnosis not present

## 2017-06-16 MED FILL — UNIFINE PENTIPS 31GX3/16: 31G X 5 MM | 90 days supply | Qty: 100 | Fill #3

## 2017-06-16 MED FILL — UNIFINE PENTIPS 31GX3/16": 31G X 5 MM | 90 days supply | Qty: 100 | Fill #3

## 2017-06-20 ENCOUNTER — Telehealth: Payer: Self-pay | Admitting: Radiation Oncology

## 2017-06-20 NOTE — Telephone Encounter (Signed)
Received voicemail message from patient requesting a return call. Patient expressed in the voicemail he would like to arrange an appointment with Dr. Tammi Klippel for consultation.

## 2017-06-20 NOTE — Telephone Encounter (Signed)
Opened in error

## 2017-06-20 NOTE — Telephone Encounter (Signed)
Phoned patient back and provided him with an appointment for 06/24/2017. Patient reports refused this appointment because it conflicted with another appointment. Then, provided patient with appointment for 07/14/2017 at 1330 for a 1400 with [facility]. Patient accepted this appointment. Phoned Bethena Roys at [facility] and requested records be faxed.

## 2017-06-23 MED FILL — INVOKANA 100 MG TABLET: 100 | 90 days supply | Qty: 90 | Fill #1

## 2017-06-23 MED FILL — HYDROCHLOROTHIAZIDE 25 MG T: 25 | 90 days supply | Qty: 90 | Fill #2

## 2017-06-23 MED FILL — DOXAZOSIN MESYLATE 2 MG TAB: 2 | 90 days supply | Qty: 180 | Fill #3

## 2017-06-23 MED FILL — AMLODIPINE BESYLATE 10 MG T: 10 | 90 days supply | Qty: 90 | Fill #3

## 2017-06-23 MED FILL — metFORMIN HCL 500 MG TABS: 500 | 90 days supply | Qty: 360 | Fill #3

## 2017-06-23 MED FILL — LOSARTAN POTASSIUM 100 MG T: 100 | 90 days supply | Qty: 90 | Fill #3

## 2017-06-26 DIAGNOSIS — G4733 Obstructive sleep apnea (adult) (pediatric): Secondary | ICD-10-CM | POA: Diagnosis not present

## 2017-07-10 ENCOUNTER — Encounter: Payer: Self-pay | Admitting: Radiation Oncology

## 2017-07-10 NOTE — Progress Notes (Signed)
GU Location of Tumor / Histology: biochemical recurrent prostatic adenocarcinoma  If Prostate Cancer, Gleason Score is (4 + 3) and PSA is (5.14) pre treatment.   06/12/2017 PSA s/p prostatectomy 0.101  Surgical Pathology:   Past/Anticipated interventions by urology, if any: BNS RAL radical prostatectomy and BPLND on 01/23/17  Past/Anticipated interventions by medical oncology, if any: no  Weight changes, if any: no  Bowel/Bladder complaints, if any: IPSS 14. Denies dysuria or hematuria. Reports urinary leakage s/p prostatectomy.   Nausea/Vomiting, if any: no  Pain issues, if any:  no  SAFETY ISSUES:  Prior radiation? no  Pacemaker/ICD? no  Possible current pregnancy? no  Is the patient on methotrexate? no  Current Complaints / other details:  66 year old male. 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 Father with hx of prostate ca. Mother with hx of small cell lung ca. Works out 5-6 days per week.

## 2017-07-14 ENCOUNTER — Other Ambulatory Visit: Payer: Self-pay

## 2017-07-14 ENCOUNTER — Ambulatory Visit
Admission: RE | Admit: 2017-07-14 | Discharge: 2017-07-14 | Disposition: A | Discharge status: Home / Self Care | Payer: 59 | Source: Ambulatory Visit | Attending: Radiation Oncology | Admitting: Radiation Oncology

## 2017-07-14 ENCOUNTER — Institutional Professional Consult (permissible substitution): Payer: 59 | Admitting: Radiation Oncology

## 2017-07-14 ENCOUNTER — Encounter: Payer: Self-pay | Admitting: Radiation Oncology

## 2017-07-14 VITALS — BP 158/70 | HR 88 | Resp 16 | Ht 71.0 in | Wt 312.6 lb

## 2017-07-14 DIAGNOSIS — M1990 Unspecified osteoarthritis, unspecified site: Secondary | ICD-10-CM | POA: Insufficient documentation

## 2017-07-14 DIAGNOSIS — Z87891 Personal history of nicotine dependence: Secondary | ICD-10-CM | POA: Diagnosis not present

## 2017-07-14 DIAGNOSIS — Z7984 Long term (current) use of oral hypoglycemic drugs: Secondary | ICD-10-CM | POA: Diagnosis not present

## 2017-07-14 DIAGNOSIS — I10 Essential (primary) hypertension: Secondary | ICD-10-CM | POA: Diagnosis not present

## 2017-07-14 DIAGNOSIS — Z79899 Other long term (current) drug therapy: Secondary | ICD-10-CM | POA: Insufficient documentation

## 2017-07-14 DIAGNOSIS — G4730 Sleep apnea, unspecified: Secondary | ICD-10-CM | POA: Insufficient documentation

## 2017-07-14 DIAGNOSIS — E785 Hyperlipidemia, unspecified: Secondary | ICD-10-CM | POA: Insufficient documentation

## 2017-07-14 DIAGNOSIS — E119 Type 2 diabetes mellitus without complications: Secondary | ICD-10-CM | POA: Diagnosis not present

## 2017-07-14 DIAGNOSIS — J45909 Unspecified asthma, uncomplicated: Secondary | ICD-10-CM | POA: Diagnosis not present

## 2017-07-14 DIAGNOSIS — E669 Obesity, unspecified: Secondary | ICD-10-CM | POA: Diagnosis not present

## 2017-07-14 DIAGNOSIS — Z881 Allergy status to other antibiotic agents status: Secondary | ICD-10-CM | POA: Insufficient documentation

## 2017-07-14 DIAGNOSIS — Z9079 Acquired absence of other genital organ(s): Secondary | ICD-10-CM | POA: Diagnosis not present

## 2017-07-14 DIAGNOSIS — C61 Malignant neoplasm of prostate: Secondary | ICD-10-CM

## 2017-07-14 DIAGNOSIS — Z8042 Family history of malignant neoplasm of prostate: Secondary | ICD-10-CM | POA: Diagnosis not present

## 2017-07-14 DIAGNOSIS — N400 Enlarged prostate without lower urinary tract symptoms: Secondary | ICD-10-CM | POA: Insufficient documentation

## 2017-07-14 DIAGNOSIS — R9721 Rising PSA following treatment for malignant neoplasm of prostate: Secondary | ICD-10-CM | POA: Diagnosis not present

## 2017-07-14 NOTE — Progress Notes (Signed)
Radiation Oncology         [phone] ________________________________  Initial outpatient Consultation  Name: Rodney Hayes MRN: [id number]  Date: 07/14/2017  DOB: 05-01-1951  QQ:[id number], Rodney Medina, MD  [name], MD   REFERRING PHYSICIAN: [name], MD  DIAGNOSIS: 66 year-old gentleman s/p prostatectomy with extracapsular extension and detectable post-op PSA of 0.101 for Gleason 4+3 prostate cancer    ICD-10-CM   1. Malignant neoplasm of prostate ([place]) C61   2. Prostate cancer [facility]) C61    HISTORY OF PRESENT ILLNESS: Rodney Hayes is a 66 y.o. male with a diagnosis of prostate cancer who was treated surgically in July 2018. At the time of his diagnosis, his PSA was 5.14, and Gleason score was 4+3. He underwent prostatectomy on 01/23/2017 with [name] and final pathology revealed a 4+3 cancer with a tertiary pattern of 5. He had extracapsular extension, perineural invasion, and all of his pelvic nodes were negative. His postop PSA on 04/24/17 was 0.09. His PSA was rechecked on 06/11/2017 and was 0.101. He comes today to discuss the options for adjuvant/salvage therapy to the prostatic fossa.  PREVIOUS RADIATION THERAPY: No  PAST MEDICAL HISTORY:  Past Medical History:  Diagnosis Date  . Arthritis   . Asthma    as a child  . BPH (benign prostatic hyperplasia)   . Diabetes mellitus    average fasting 140s type 2  . DJD (degenerative joint disease)   . Heart murmur    dr. Haroldine Hayes  . Hepatitis    hep B  . Hyperlipidemia   . Hypertension   . Nonspecific abnormal electrocardiogram (ECG) (EKG)   . Obesity   . Prostate cancer ([place])   . Shortness of breath dyspnea    exertion  . Sleep apnea    C-PAP      PAST SURGICAL HISTORY: Past Surgical History:  Procedure Laterality Date  . Biceps tendon reinsertion    . colonoscopy with polypectomy     adenomatous; [name]  . JOINT REPLACEMENT Left    knee  . left knee arthroscopy    . LYMPHADENECTOMY  Bilateral 01/23/2017   Procedure: BILATERAL LYMPHADENECTOMY;  Surgeon: [name], MD;  Location: [facility];  Service: Urology;  Laterality: Bilateral;  . PROSTATECTOMY     01/23/17 [name]  . ROBOT ASSISTED LAPAROSCOPIC RADICAL PROSTATECTOMY N/A 01/23/2017   Procedure: XI ROBOTIC ASSISTED LAPAROSCOPIC RADICAL PROSTATECTOMY LEVEL 3;  Surgeon: [name], MD;  Location: [facility];  Service: Urology;  Laterality: N/A;  . TONSILLECTOMY AND ADENOIDECTOMY    . TOTAL KNEE ARTHROPLASTY Left 01/11/2013   Procedure: LEFT TOTAL KNEE ARTHROPLASTY;  Surgeon: [name], MD;  Location: [facility];  Service: Orthopedics;  Laterality: Left;  . TOTAL SHOULDER ARTHROPLASTY Left 08/12/2014   Procedure: LEFT TOTAL SHOULDER ARTHROPLASTY;  Surgeon: [name], MD;  Location: [place];  Service: Orthopedics;  Laterality: Left;  . UMBILICAL HERNIA REPAIR      FAMILY HISTORY:  Family History  Problem Relation Age of Onset  . Lung cancer Mother        smoker/lung ca and colon?  . Diabetes Father   . Coronary artery disease Father        CBAG in late 20s  . Cancer Father        prostate  . Stroke Neg Hx     SOCIAL HISTORY:  Social History   Socioeconomic History  . Marital status: Married    Spouse name: Not on  file  . Number of children: Not on file  . Years of education: Not on file  . Highest education level: Not on file  Social Needs  . Financial resource strain: Not on file  . Food insecurity - worry: Not on file  . Food insecurity - inability: Not on file  . Transportation needs - medical: Not on file  . Transportation needs - non-medical: Not on file  Occupational History  . Not on file  Tobacco Use  . Smoking status: Former Smoker    Packs/day: 2.00    Years: 30.00    Pack years: 60.00    Types: Cigarettes    Start date: 07/15/1962    Last attempt to quit: 08/15/1992    Years since quitting: 24.9  . Smokeless tobacco: Never Used  . Tobacco comment: smoked 14 -42 up to 2 ppd    Substance and Sexual Activity  . Alcohol use: Yes    Alcohol/week: 2.4 oz    Types: 4 Glasses of wine per week    Comment: Red Wine  . Drug use: No  . Sexual activity: Yes  Other Topics Concern  . Not on file  Social History Narrative  . Not on file  The patient is married and lives in [place]. He is a Designer, jewellery and works in critical care. He enjoys shooting long range rifles.  ALLERGIES: Cymbalta [duloxetine hcl] and Tetracycline  MEDICATIONS:  Current Outpatient Medications  Medication Sig Dispense Refill  . amLODipine (NORVASC) 10 MG tablet Take 1 tablet (10 mg total) by mouth every morning. 90 tablet 3  . canagliflozin (INVOKANA) 100 MG TABS tablet Take 1 tablet (100 mg total) by mouth daily before breakfast. 90 tablet 3  . doxazosin (CARDURA) 2 MG tablet Take 1 tablet (2 mg total) by mouth 2 (two) times daily. 180 tablet 3  . glucose blood (ACCU-CHEK GUIDE) test strip Use as instructed 100 each 12  . hydrochlorothiazide (HYDRODIURIL) 25 MG tablet Take 1 tablet (25 mg total) by mouth daily. 90 tablet 3  . Lancets Misc. (ACCU-CHEK FASTCLIX LANCET) KIT Use as directed 1 kit 5  . liraglutide (VICTOZA) 18 MG/3ML SOPN Inject 0.3 mLs (1.8 mg total) into the skin daily. 9 pen 3  . losartan (COZAAR) 100 MG tablet Take 1 tablet (100 mg total) by mouth daily. 90 tablet 3  . metFORMIN (GLUCOPHAGE) 500 MG tablet TAKE 2 TABLETS BY MOUTH TWICE DAILY AFTER 2 LARGEST MEALS 360 tablet 3  . naproxen sodium (ALEVE) 220 MG tablet Take 220 mg by mouth.    . psyllium (METAMUCIL) 58.6 % packet Take 1 packet by mouth daily.    Marland Kitchen UNIFINE PENTIPS 31G X 5 MM MISC USE FOR VICTOZA DAILY AS DIRECTED  99   No current facility-administered medications for this encounter.     REVIEW OF SYSTEMS:  On review of systems, the patient reports that he is doing well overall. He denies any chest pain, shortness of breath, cough, fevers, chills, night sweats, unintended weight changes. He denies any bowel  disturbances, and denies abdominal pain, nausea or vomiting. He denies any new musculoskeletal or joint aches or pains. His IPSS was 14, indicating moderate urinary symptoms. He reports urinary leakage s/p prostatectomy intermittently. He denies dysuria or hematuria. He is able to complete sexual activity with most attempts with the help of viagra. A complete review of systems is obtained and is otherwise negative.    PHYSICAL EXAM:  Wt Readings from Last 3 Encounters:  07/14/17 Marland Kitchen)  312 lb 9.6 oz (141.8 kg)  03/07/17 (!) 302 lb (137 kg)  01/23/17 (!) 302 lb (137 kg)   Temp Readings from Last 3 Encounters:  01/24/17 98.4 F (36.9 C) (Oral)  01/14/17 98.7 F (37.1 C) (Oral)  11/04/16 98.8 F (37.1 C) (Oral)   BP Readings from Last 3 Encounters:  07/14/17 (!) 158/70  03/07/17 (!) 142/80  01/24/17 (!) 155/72   Pulse Readings from Last 3 Encounters:  07/14/17 88  03/07/17 81  01/24/17 73   Pain Assessment Pain Score: 0-No pain/10  In general this is a well appearing caucasian  male in no acute distress. He is alert and oriented x4 and appropriate throughout the examination. HEENT reveals that the patient is normocephalic, atraumatic. EOMs are intact. PERRLA. Cardiopulmonary assessment is negative for acute distress and he exhibits normal effort.    KPS = 90  100 - Normal; no complaints; no evidence of disease. 90   - Able to carry on normal activity; Jakob signs or symptoms of disease. 80   - Normal activity with effort; some signs or symptoms of disease. 66   - Cares for self; unable to carry on normal activity or to do active work. 60   - Requires occasional assistance, but is able to care for most of his personal needs. 50   - Requires considerable assistance and frequent medical care. 45   - Disabled; requires special care and assistance. 68   - Severely disabled; hospital admission is indicated although death not imminent. 58   - Very sick; hospital admission necessary;  active supportive treatment necessary. 10   - Moribund; fatal processes progressing rapidly. 0     - Dead  Karnofsky DA, Abelmann [place], Craver LS and Burchenal [facility] [phone]) The use of the nitrogen mustards in the palliative treatment of carcinoma: with particular reference to bronchogenic carcinoma Cancer 1 [id number]  LABORATORY DATA:  Lab Results  Component Value Date   WBC 5.9 01/14/2017   HGB 12.9 (L) 01/24/2017   HCT 38.0 (L) 01/24/2017   MCV 92.5 01/14/2017   PLT 176 01/14/2017   Lab Results  Component Value Date   NA 139 01/14/2017   K 3.8 01/14/2017   CL 101 01/14/2017   CO2 28 01/14/2017   Lab Results  Component Value Date   ALT 34 08/09/2015   AST 28 08/09/2015   ALKPHOS 61 08/09/2015   BILITOT 0.7 08/09/2015     RADIOGRAPHY: No results found.    IMPRESSION/PLAN: 1. 66 y.o. gentleman with detectable PSA following prostatectomy for intermediate risk adenocarcinoma of the prostate. We met with the patient today and we discussed his prostate cancer course including his pathology findings from his prior surgery. We reviewed the clinical scenario specifically with detectable PSA following prostatectomy and discussed the high risk features of his pathology results. Regarding general treatment, highlighting the role of radiotherapy in the management, we described the differences in adjuvant versus salvage approach and recommends treating him with salvage therapy. We discussed the available radiation techniques, and focused on the details of logistics and delivery. We reviewed the anticipated acute and late sequelae associated with radiation in this setting.  The patient was encouraged to ask questions that we answered to the best of our ability. He would like to consider his options from today's discussion and proceed with radiation and also have a repeat PSA in the next few weeks. I will send this to [name] and follow up with the patient in the next  week or two.  In a visit lasting  45 minutes, greater than 50% of the time was spent face to face discussing his case in detail, offering treatment recommendations, and coordinating the patient's care.     Carola Rhine, PAC    Tyler Pita, MD  [facility] Direct Dial: [phone]  Fax: [phone] [email or web address]  Skype  [email or web address]  This document serves as a record of services personally performed by Tyler Pita, MD and Shona Simpson, PA-C. It was created on their behalf by Arlyce Harman, a trained medical scribe. The creation of this record is based on the scribe's personal observations and the provider's statements to them. This document has been checked and approved by the attending provider.

## 2017-07-14 NOTE — Progress Notes (Signed)
See progress note under physician encounter. 

## 2017-07-22 DIAGNOSIS — C61 Malignant neoplasm of prostate: Secondary | ICD-10-CM | POA: Diagnosis not present

## 2017-07-24 ENCOUNTER — Ambulatory Visit: Payer: 59 | Admitting: Internal Medicine

## 2017-07-24 ENCOUNTER — Encounter: Payer: Self-pay | Admitting: Internal Medicine

## 2017-07-24 ENCOUNTER — Telehealth: Payer: Self-pay | Admitting: Internal Medicine

## 2017-07-24 VITALS — BP 160/78 | HR 78 | Ht 72.0 in | Wt 313.6 lb

## 2017-07-24 DIAGNOSIS — Z6841 Body Mass Index (BMI) 40.0 and over, adult: Secondary | ICD-10-CM | POA: Diagnosis not present

## 2017-07-24 DIAGNOSIS — E1165 Type 2 diabetes mellitus with hyperglycemia: Secondary | ICD-10-CM

## 2017-07-24 LAB — POCT GLYCOSYLATED HEMOGLOBIN (HGB A1C): HEMOGLOBIN A1C: 7.4

## 2017-07-24 MED ORDER — METFORMIN HCL 1000 MG PO TABS: ORAL_TABLET | ORAL | 3 refills | Status: DC

## 2017-07-24 NOTE — Telephone Encounter (Signed)
LVM for pharmacy that pt was seen in office today and dose was changed so this is correct

## 2017-07-24 NOTE — Addendum Note (Signed)
Addended by: Drucilla Schmidt on: 07/24/2017 04:28 PM   Modules accepted: Orders

## 2017-07-24 NOTE — Progress Notes (Signed)
Patient ID: Rodney Hayes, male   DOB: June 24, 1951, 67 y.o.   MRN: [id number]   HPI: Rodney Hayes is a 67 y.o.-year-old male, returning for f/u for DM2, dx in 2011, non-insulin-dependent, uncontrolled, without long term complications. Last visit 4.5 mo ago.  He  has a history of prostate cancer Gleason 7, + some extra-prostate extension; and had robotic radical prostatectomy in 01/2017.  Last A1c available was obtained right before surgery.  Last hemoglobin A1c was: Lab Results  Component Value Date   HGBA1C 6.7 (H) 01/14/2017   HGBA1C 7.3 11/14/2016   HGBA1C 9.9 (H) 08/16/2016   Pt is on a regimen of - Metformin 1000 mg 2x a day with meals - Victoza 1.8 mg daily in am - Invokana 100 mg daily in am  - started 09/2016 Previously on Kombiglyze 2.11-998 mg daily   Pt checks his sugars 1x a day: - am: 111-154, 169 >> 121-150, 158 >> 146-205, 230 - 2h after b'fast: n/c >> 104, 210 >> 197 >> n/c - before lunch: n/c  - 2h after lunch: n/c - before dinner: n/c >> 154 >> n/c  - 2h after dinner: n/c >> 153 >> n/c - bedtime: n/c - nighttime: n/c Lowest sugar was 121 >> 146; he has hypoglycemia awareness at 70.  Highest sugar was 197 >> 230.  Glucometer: AccuChek  Pt's meals are: - Breakfast:  Bacon, 2 bisquits, fruit - Lunch: hamburger, fries - Dinner: chicken, salad - Snacks: 4, nuts, candy, cottage cheese He usually works out at [employer] 3x a week: cardio, weight training - works with a Clinical research associate twice a week (less since his surgery >> started )  No CKD, last BUN/Cr: Lab Results  Component Value Date   BUN 18 01/14/2017   Lab Results  Component Value Date   CREATININE 0.91 01/14/2017  On losartan -+ HL; last set of lipids: Lab Results  Component Value Date   CHOL 166 08/16/2016   HDL 44.00 08/16/2016   LDLCALC 70 08/09/2015   LDLDIRECT 89.0 08/16/2016   TRIG 205.0 (H) 08/16/2016   CHOLHDL 4 08/16/2016  He cannot use statins because of joint pain. - last eye exam was  in  05/2017: No DR . + cataract. -No numbness and tingling in his feet.  He also has a history of HTN, OSA.  ROS: Constitutional: no weight gain/no weight loss, no fatigue, no subjective hyperthermia, no subjective hypothermia Eyes: no blurry vision, no xerophthalmia ENT: no sore throat, no nodules palpated in throat, no dysphagia, no odynophagia, no hoarseness Cardiovascular: no CP/no SOB/no palpitations/no leg swelling Respiratory: no cough/no SOB/no wheezing Gastrointestinal: no N/no V/no D/no C/no acid reflux Musculoskeletal: no muscle aches/no joint aches Skin: no rashes, no hair loss Neurological: no tremors/no numbness/no tingling/no dizziness  I reviewed pt's medications, allergies, PMH, social hx, family hx, and changes were documented in the history of present illness. Otherwise, unchanged from my initial visit note.  Past Medical History:  Diagnosis Date  . Arthritis   . Asthma    as a child  . BPH (benign prostatic hyperplasia)   . Diabetes mellitus    average fasting 140s type 2  . DJD (degenerative joint disease)   . Heart murmur    [name]  . Hepatitis    hep B  . Hyperlipidemia   . Hypertension   . Nonspecific abnormal electrocardiogram (ECG) (EKG)   . Obesity   . Prostate cancer ([place])   . Shortness of breath dyspnea  exertion  . Sleep apnea    C-PAP   Past Surgical History:  Procedure Laterality Date  . Biceps tendon reinsertion    . colonoscopy with polypectomy     adenomatous; [name]  . JOINT REPLACEMENT Left    knee  . left knee arthroscopy    . LYMPHADENECTOMY Bilateral 01/23/2017   Procedure: BILATERAL LYMPHADENECTOMY;  Surgeon: [name], MD;  Location: [facility];  Service: Urology;  Laterality: Bilateral;  . PROSTATECTOMY     01/23/17 [name]  . ROBOT ASSISTED LAPAROSCOPIC RADICAL PROSTATECTOMY N/A 01/23/2017   Procedure: XI ROBOTIC ASSISTED LAPAROSCOPIC RADICAL PROSTATECTOMY LEVEL 3;  Surgeon: [name], MD;  Location:  [facility];  Service: Urology;  Laterality: N/A;  . TONSILLECTOMY AND ADENOIDECTOMY    . TOTAL KNEE ARTHROPLASTY Left 01/11/2013   Procedure: LEFT TOTAL KNEE ARTHROPLASTY;  Surgeon: [name], MD;  [facility];  Service: Orthopedics;  Laterality: Left;  . TOTAL SHOULDER ARTHROPLASTY Left 08/12/2014   Procedure: LEFT TOTAL SHOULDER ARTHROPLASTY;  Surgeon: [name], MD;  Location: [place];  Service: Orthopedics;  Laterality: Left;  . UMBILICAL HERNIA REPAIR     Social History   Socioeconomic History  . Marital status: Married    Spouse name: Not on file  . Number of children: Not on file  . Years of education: Not on file  . Highest education level: Not on file  Social Needs  . Financial resource strain: Not on file  . Food insecurity - worry: Not on file  . Food insecurity - inability: Not on file  . Transportation needs - medical: Not on file  . Transportation needs - non-medical: Not on file  Occupational History  . Not on file  Tobacco Use  . Smoking status: Former Smoker    Packs/day: 2.00    Years: 30.00    Pack years: 60.00    Types: Cigarettes    Start date: 07/15/1962    Last attempt to quit: 08/15/1992    Years since quitting: 24.9  . Smokeless tobacco: Never Used  . Tobacco comment: smoked 14 -42 up to 2 ppd  Substance and Sexual Activity  . Alcohol use: Yes    Alcohol/week: 2.4 oz    Types: 4 Glasses of wine per week    Comment: Red Wine  . Drug use: No  . Sexual activity: Yes  Other Topics Concern  . Not on file  Social History Narrative  . Not on file   Current Outpatient Medications on File Prior to Visit  Medication Sig Dispense Refill  . amLODipine (NORVASC) 10 MG tablet Take 1 tablet (10 mg total) by mouth every morning. 90 tablet 3  . canagliflozin (INVOKANA) 100 MG TABS tablet Take 1 tablet (100 mg total) by mouth daily before breakfast. 90 tablet 3  . doxazosin (CARDURA) 2 MG tablet Take 1 tablet (2 mg total) by mouth 2 (two) times daily.  180 tablet 3  . glucose blood (ACCU-CHEK GUIDE) test strip Use as instructed 100 each 12  . hydrochlorothiazide (HYDRODIURIL) 25 MG tablet Take 1 tablet (25 mg total) by mouth daily. 90 tablet 3  . Lancets Misc. (ACCU-CHEK FASTCLIX LANCET) KIT Use as directed 1 kit 5  . liraglutide (VICTOZA) 18 MG/3ML SOPN Inject 0.3 mLs (1.8 mg total) into the skin daily. 9 pen 3  . losartan (COZAAR) 100 MG tablet Take 1 tablet (100 mg total) by mouth daily. 90 tablet 3  . metFORMIN (GLUCOPHAGE) 500 MG tablet TAKE  2 TABLETS BY MOUTH TWICE DAILY AFTER 2 LARGEST MEALS 360 tablet 3  . naproxen sodium (ALEVE) 220 MG tablet Take 220 mg by mouth.    . psyllium (METAMUCIL) 58.6 % packet Take 1 packet by mouth daily.    Marland Kitchen UNIFINE PENTIPS 31G X 5 MM MISC USE FOR VICTOZA DAILY AS DIRECTED  99   No current facility-administered medications on file prior to visit.    Allergies  Allergen Reactions  . Cymbalta [Duloxetine Hcl]     As per e-mail 12/13/11: Insomnia, lethargy, inability to ejaculate  . Tetracycline     REACTION: PHOTOSENSITIVITY   Family History  Problem Relation Age of Onset  . Lung cancer Mother        smoker/lung ca and colon?  . Diabetes Father   . Coronary artery disease Father        CBAG in late 37s  . Cancer Father        prostate  . Stroke Neg Hx    PE: BP (!) 160/78   Pulse 78   Ht 6' (1.829 m)   Wt (!) 313 lb 9.6 oz (142.2 kg)   SpO2 95%   BMI 42.53 kg/m  Wt Readings from Last 3 Encounters:  07/14/17 (!) 312 lb 9.6 oz (141.8 kg)  03/07/17 (!) 302 lb (137 kg)  01/23/17 (!) 302 lb (137 kg)   Constitutional: obese, in NAD Eyes: PERRLA, EOMI, no exophthalmos ENT: moist mucous membranes, no thyromegaly, no cervical lymphadenopathy Cardiovascular: RRR, No MRG Respiratory: CTA B Gastrointestinal: abdomen soft, NT, ND, BS+ Musculoskeletal: no deformities, strength intact in all 4 Skin: moist, warm, no rashes Neurological: no tremor with outstretched hands, DTR normal in all  4  ASSESSMENT: 1. DM2, non-insulin-dependent, uncontrolled, without long term complications, but with hyperglycemia  2. Obesity class 3  PLAN:  1. Patient with long-standing, uncontrolled, diabetes, with better control at last visit on metformin, Invokana, and Victoza.  His sugars improved significantly after starting Invokana in the past, which he tolerates very well.  He does have increased urination with this he is also on HCTZ.  We discussed at last visit about taking Invokana 9 AM and HCTZ after lunch. - He did a good job before last visit changing his diet but after his prostate surgery he was eating more and was not able to exercise - since then, he restarted exercise, but was more depressed as his PSA was not quite undetectable and he now has to have RxTx - however, he started to feel better recently and started to go more to the gym >> he is determines to lose the weight and get better ctrl of his DM - to help am sugars >> I advised him to move all Metformin with dinner - I suggested to:  Patient Instructions  Please continue: - Victoza 1.8 mg daily in am - Invokana 100 mg daily in am  Move all Metformin with dinner (2000 mg).  Please return in 3-4 months with your sugar log.   - today, HbA1c is 7.4% (higher) - continue checking sugars at different times of the day - check 1x a day, rotating checks - advised for yearly eye exams >> he is UTD - BP high today >> refused a repeat. He checks is at home >> usually lower. - Return to clinic in 3-4 mo with sugar log   2. Obesity class 3 - He gained 10 pounds since last visit - He did lose weight before his prostate surgery, but  afterwards he was not able to go to the gym and gained weight  Philemon Kingdom, MD PhD [facility]

## 2017-07-24 NOTE — Patient Instructions (Addendum)
Please continue: - Victoza 1.8 mg daily in am - Invokana 100 mg daily in am  Move all Metformin with dinner (2000 mg).  Please return in 3-4 months with your sugar log.

## 2017-07-24 NOTE — Telephone Encounter (Signed)
metFORMIN (GLUCOPHAGE) 1000 MG tablet   The last time it was sent over he took 500 mg. Pharmacy wants to know if this is correct?   [phone]

## 2017-07-30 MED FILL — VICTOZA 18 MG/3 ML INJECT P: 18 | 90 days supply | Qty: 27 | Fill #0

## 2017-08-07 MED FILL — ACCU-CHEK GUIDE TEST STRIP: 90 days supply | Qty: 100 | Fill #2

## 2017-09-15 ENCOUNTER — Other Ambulatory Visit: Payer: Self-pay | Admitting: Pulmonary Disease

## 2017-09-15 DIAGNOSIS — I10 Essential (primary) hypertension: Secondary | ICD-10-CM

## 2017-09-15 MED ORDER — LOSARTAN POTASSIUM 100 MG PO TABS: 100.0000 mg | ORAL_TABLET | Freq: Every day | ORAL | 3 refills | Status: DC

## 2017-09-15 MED FILL — AMLODIPINE BESYLATE 10 MG T: 10 | 90 days supply | Qty: 90 | Fill #0

## 2017-09-15 MED FILL — HYDROCHLOROTHIAZIDE 25 MG T: 25 | 90 days supply | Qty: 90 | Fill #3

## 2017-09-15 MED FILL — DOXAZOSIN MESYLATE 2 MG TAB: 2 | 90 days supply | Qty: 180 | Fill #0

## 2017-09-15 MED FILL — INVOKANA 100 MG TABLET: 100 | 90 days supply | Qty: 90 | Fill #2

## 2017-09-15 MED FILL — metFORMIN HCL 1000 MG TABS: 1000 | 90 days supply | Qty: 360 | Fill #0

## 2017-09-15 MED FILL — LOSARTAN POTASSIUM 100 MG T: 100 | 90 days supply | Qty: 90 | Fill #0

## 2017-09-22 ENCOUNTER — Other Ambulatory Visit: Payer: Self-pay | Admitting: Pulmonary Disease

## 2017-09-22 MED FILL — UNIFINE PENTIPS 31GX3/16": 31G X 5 MM | 90 days supply | Qty: 100 | Fill #0

## 2017-09-22 MED FILL — UNIFINE PENTIPS 31GX3/16: 31G X 5 MM | 90 days supply | Qty: 100 | Fill #0

## 2017-10-23 ENCOUNTER — Ambulatory Visit: Payer: 59 | Admitting: Internal Medicine

## 2017-10-23 ENCOUNTER — Encounter: Payer: Self-pay | Admitting: Internal Medicine

## 2017-10-23 VITALS — BP 146/84 | HR 77 | Ht 72.0 in | Wt 310.8 lb

## 2017-10-23 DIAGNOSIS — E1165 Type 2 diabetes mellitus with hyperglycemia: Secondary | ICD-10-CM

## 2017-10-23 DIAGNOSIS — E782 Mixed hyperlipidemia: Secondary | ICD-10-CM

## 2017-10-23 LAB — POCT GLYCOSYLATED HEMOGLOBIN (HGB A1C): Hemoglobin A1C: 7.2

## 2017-10-23 NOTE — Patient Instructions (Addendum)
Please continue: - Victoza 1.8 mg daily in am - Invokana 100 mg daily in am - Metformin 2000 mg with dinner  Please return in 3-4 months with your sugar log.

## 2017-10-23 NOTE — Progress Notes (Signed)
Patient ID: Rodney Hayes, male   DOB: [birth year], 67 y.o.   MRN: [id number]   HPI: Rodney Hayes is a 67 y.o.-year-old male, returning for f/u for DM2, dx in 2011, non-insulin-dependent, uncontrolled, without long term complications. Last visit 3 months ago.  Last hemoglobin A1c was: Lab Results  Component Value Date   HGBA1C 7.4 07/24/2017   HGBA1C 6.7 (H) 01/14/2017   HGBA1C 7.3 11/14/2016   Pt is on a regimen of - Metformin 1000 mg 2x a day with meals >> 2000 mg with dinner - Victoza 1.8 mg daily in am - Invokana 100 mg daily in am  -study 09/2016 Previously on Kombiglyze 2.11-998 mg daily   Pt checks his sugars once a day: - am: 121-150, 158 >> 146-205, 230 >> 150-183 - 2h after b'fast: n/c >> 104, 210 >> 197 >> n/c - before lunch: n/c  - 2h after lunch: n/c >> 172 - before dinner: n/c >> 154 >> n/c  - 2h after dinner: n/c >> 153 >> n/c - bedtime: n/c - nighttime: n/c Lowest sugar was 121 >> 146 >> 146; he has hypoglycemia awareness in the 70s. Highest sugar was 197 >> 230 >> 230.  Glucometer: AccuChek  Pt's meals are: - Breakfast:  Bacon, 2 bisquits, fruit - Lunch: hamburger, fries - Dinner: chicken, salad - Snacks: 4, nuts, candy, cottage cheese  He continues to workout at the gym approximately 3 times a week: Cardio, weights training  No CKD, last BUN/Cr: Lab Results  Component Value Date   BUN 18 01/14/2017   Lab Results  Component Value Date   CREATININE 0.91 01/14/2017  On losartan 100. -+ HL; last set of lipids: Lab Results  Component Value Date   CHOL 166 08/16/2016   HDL 44.00 08/16/2016   LDLCALC 70 08/09/2015   LDLDIRECT 89.0 08/16/2016   TRIG 205.0 (H) 08/16/2016   CHOLHDL 4 08/16/2016  He cannot use statins because of joint pain. - last eye exam was in 05/2017: No DR, + cataract - no numbness and tingling in his feet.  He also has a history of HTN, OSA.  He  has a history of prostate cancer Gleason 7, + some extra-prostate extension;  and had robotic radical prostatectomy in 01/2017.   ROS: Constitutional: no weight gain/no weight loss, no fatigue, no subjective hyperthermia, no subjective hypothermia Eyes: no blurry vision, no xerophthalmia ENT: no sore throat, no nodules palpated in throat, no dysphagia, no odynophagia, no hoarseness Cardiovascular: no CP/no SOB/no palpitations/no leg swelling Respiratory: no cough/no SOB/no wheezing Gastrointestinal: no N/no V/no D/no C/no acid reflux Musculoskeletal: no muscle aches/no joint aches Skin: no rashes, no hair loss Neurological: no tremors/no numbness/no tingling/no dizziness  I reviewed pt's medications, allergies, PMH, social hx, family hx, and changes were documented in the history of present illness. Otherwise, unchanged from my initial visit note.  Past Medical History:  Diagnosis Date  . Arthritis   . Asthma    as a child  . BPH (benign prostatic hyperplasia)   . Diabetes mellitus    average fasting 140s type 2  . DJD (degenerative joint disease)   . Heart murmur    [name]  . Hepatitis    hep B  . Hyperlipidemia   . Hypertension   . Nonspecific abnormal electrocardiogram (ECG) (EKG)   . Obesity   . Prostate cancer ([place])   . Shortness of breath dyspnea    exertion  . Sleep apnea    C-PAP  Past Surgical History:  Procedure Laterality Date  . Biceps tendon reinsertion    . colonoscopy with polypectomy     adenomatous; [name]  . JOINT REPLACEMENT Left    knee  . left knee arthroscopy    . LYMPHADENECTOMY Bilateral 01/23/2017   Procedure: BILATERAL LYMPHADENECTOMY;  Surgeon: [name], MD;  Location: [facility];  Service: Urology;  Laterality: Bilateral;  . PROSTATECTOMY     01/23/17 [name]  . ROBOT ASSISTED LAPAROSCOPIC RADICAL PROSTATECTOMY N/A 01/23/2017   Procedure: XI ROBOTIC ASSISTED LAPAROSCOPIC RADICAL PROSTATECTOMY LEVEL 3;  Surgeon: [name], MD;  Location: [facility];  Service: Urology;  Laterality: N/A;  .  TONSILLECTOMY AND ADENOIDECTOMY    . TOTAL KNEE ARTHROPLASTY Left 01/11/2013   Procedure: LEFT TOTAL KNEE ARTHROPLASTY;  Surgeon: [name], MD;  Location: [facility];  Service: Orthopedics;  Laterality: Left;  . TOTAL SHOULDER ARTHROPLASTY Left 08/12/2014   Procedure: LEFT TOTAL SHOULDER ARTHROPLASTY;  Surgeon: [name], MD;  Location: [facility];  Service: Orthopedics;  Laterality: Left;  . UMBILICAL HERNIA REPAIR     Social History   Socioeconomic History  . Marital status: Married    Spouse name: Not on file  . Number of children: Not on file  . Years of education: Not on file  . Highest education level: Not on file  Occupational History  . Not on file  Social Needs  . Financial resource strain: Not on file  . Food insecurity:    Worry: Not on file    Inability: Not on file  . Transportation needs:    Medical: Not on file    Non-medical: Not on file  Tobacco Use  . Smoking status: Former Smoker    Packs/day: 2.00    Years: 30.00    Pack years: 60.00    Types: Cigarettes    Start date: 07/15/1962    Last attempt to quit: 08/15/1992    Years since quitting: 25.2  . Smokeless tobacco: Never Used  . Tobacco comment: smoked 14 -42 up to 2 ppd  Substance and Sexual Activity  . Alcohol use: Yes    Alcohol/week: 2.4 oz    Types: 4 Glasses of wine per week    Comment: Red Wine  . Drug use: No  . Sexual activity: Yes  Lifestyle  . Physical activity:    Days per week: Not on file    Minutes per session: Not on file  . Stress: Not on file  Relationships  . Social connections:    Talks on phone: Not on file    Gets together: Not on file    Attends religious service: Not on file    Active member of club or organization: Not on file    Attends meetings of clubs or organizations: Not on file    Relationship status: Not on file  . Intimate partner violence:    Fear of current or ex partner: Not on file    Emotionally abused: Not on file    Physically abused: Not on file     Forced sexual activity: Not on file  Other Topics Concern  . Not on file  Social History Narrative  . Not on file   Current Outpatient Medications on File Prior to Visit  Medication Sig Dispense Refill  . amLODipine (NORVASC) 10 MG tablet TAKE 1 TABLET (10 MG TOTAL) BY MOUTH EVERY MORNING. 90 tablet 3  . canagliflozin (INVOKANA) 100 MG TABS tablet Take 1 tablet (100 mg  total) by mouth daily before breakfast. 90 tablet 3  . doxazosin (CARDURA) 2 MG tablet TAKE 1 TABLET (2 MG TOTAL) BY MOUTH 2 (TWO) TIMES DAILY. 180 tablet 3  . glucose blood (ACCU-CHEK GUIDE) test strip Use as instructed 100 each 12  . hydrochlorothiazide (HYDRODIURIL) 25 MG tablet Take 1 tablet (25 mg total) by mouth daily. 90 tablet 3  . Lancets Misc. (ACCU-CHEK FASTCLIX LANCET) KIT Use as directed 1 kit 5  . liraglutide (VICTOZA) 18 MG/3ML SOPN Inject 0.3 mLs (1.8 mg total) into the skin daily. 9 pen 3  . losartan (COZAAR) 100 MG tablet TAKE 1 TABLET (100 MG TOTAL) BY MOUTH DAILY. 90 tablet 3  . losartan (COZAAR) 100 MG tablet Take 1 tablet (100 mg total) by mouth daily. 90 tablet 3  . metFORMIN (GLUCOPHAGE) 1000 MG tablet TAKE 2 TABLETS BY MOUTH TWICE DAILY AFTER 2 LARGEST MEALS 180 tablet 3  . metFORMIN (GLUCOPHAGE) 500 MG tablet TAKE 2 TABLETS BY MOUTH TWICE DAILY AFTER THE 2 LARGEST MEALS 360 tablet 3  . naproxen sodium (ALEVE) 220 MG tablet Take 220 mg by mouth.    . psyllium (METAMUCIL) 58.6 % packet Take 1 packet by mouth daily.    Marland Kitchen UNIFINE PENTIPS 31G X 5 MM MISC USE FOR VICTOZA DAILY AS DIRECTED 100 each PRN   No current facility-administered medications on file prior to visit.    Allergies  Allergen Reactions  . Cymbalta [Duloxetine Hcl]     As per e-mail 12/13/11: Insomnia, lethargy, inability to ejaculate  . Tetracycline     REACTION: PHOTOSENSITIVITY   Family History  Problem Relation Age of Onset  . Lung cancer Mother        smoker/lung ca and colon?  . Diabetes Father   . Coronary artery disease  Father        CBAG in late 55s  . Cancer Father        prostate  . Stroke Neg Hx    PE: BP (!) 146/84   Pulse 77   Ht 6' (1.829 m)   Wt (!) 310 lb 12.8 oz (141 kg)   SpO2 95%   BMI 42.15 kg/m  Wt Readings from Last 3 Encounters:  10/23/17 (!) 310 lb 12.8 oz (141 kg)  07/24/17 (!) 313 lb 9.6 oz (142.2 kg)  07/14/17 (!) 312 lb 9.6 oz (141.8 kg)   Constitutional: overweight, in NAD Eyes: PERRLA, EOMI, no exophthalmos ENT: moist mucous membranes, no thyromegaly, no cervical lymphadenopathy Cardiovascular: RRR, No MRG Respiratory: CTA B Gastrointestinal: abdomen soft, NT, ND, BS+ Musculoskeletal: no deformities, strength intact in all 4 Skin: moist, warm, no rashes Neurological: no tremor with outstretched hands, DTR normal in all 4  ASSESSMENT: 1. DM2, non-insulin-dependent, uncontrolled, without long term complications, but with hyperglycemia  2.  hyperlipidemia  PLAN:  1. Patient with long-standing, uncontrolled, diabetes, with better control on his current regimen of metformin, Invokana, and Victoza.  His sugars improved significantly after starting Invokana in the past, which he tolerates well.  He has increased urination as he is also taking HCTZ.  He is taking HCTZ after lunch and Invokana in the morning. -He had a setback with his diabetes control and his weight after his prostate cancer surgery, when he was eating more and was not able to exercise.  At last visit, he was feeling better and he already started to go back to the gym.  As his sugars were still slightly high in the morning, I advised him  to move all of the metformin dose at night - he did so >> sugars in am slightly better and he does not check later in the day - he fell off the wagon with the diet >> this week restarted to stay away from sweets and fast foods >> he would like to continue with this before changing his medication regimen - he will send me sugar readings in 1 mo - I suggested to:  Patient  Instructions  Please continue: - Victoza 1.8 mg daily in am - Invokana 100 mg daily in am - Metformin 2000 mg with dinner  Please return in 4 months with your sugar log.   - today, HbA1c is 7.2% (slightly better) - continue checking sugars at different times of the day - check 1x a day, rotating checks - advised for yearly eye exams >> he is UTD - Return to clinic in 4 mo with sugar log   2.  Hyperlipidemia -Reviewed latest lipid panel from 08/2016: LDL at goal, triglycerides slightly high -He cannot use statins because of joint pain - due for a lipid panle >> has an appt with PCP coming up  Philemon Kingdom, MD PhD [facility] Endocrinology

## 2017-10-24 DIAGNOSIS — G4733 Obstructive sleep apnea (adult) (pediatric): Secondary | ICD-10-CM | POA: Diagnosis not present

## 2017-10-30 DIAGNOSIS — C61 Malignant neoplasm of prostate: Secondary | ICD-10-CM | POA: Diagnosis not present

## 2017-10-31 IMAGING — CT CT CHEST W/O CM
2 of 4 series · 15 of 36 positions shown, 18 images · non-contrast
Comparison: CT 10/11/2014

CLINICAL DATA: Short of breath with exercise. Followup pulmonary
nodules.

EXAM:
CT CHEST WITHOUT CONTRAST
TECHNIQUE: Multidetector CT imaging of the chest was performed following the
standard protocol without IV contrast.

[Series 2: thorax · axial · 0.75mm/px · z∈[+1148,+1426]mm · 12 of 165 slices shown, 15 images]
[im 13/165  mediastinal]
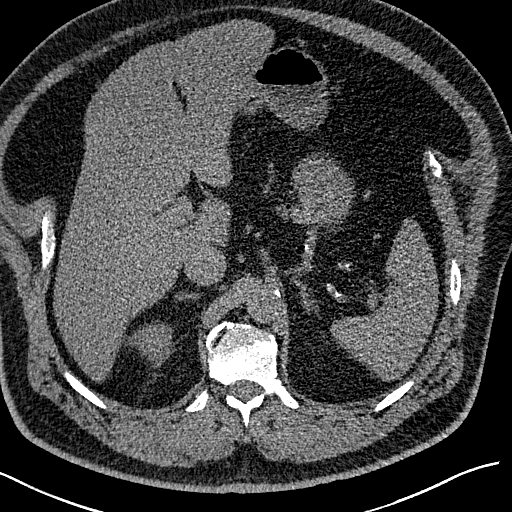
[im 13/165  lung]
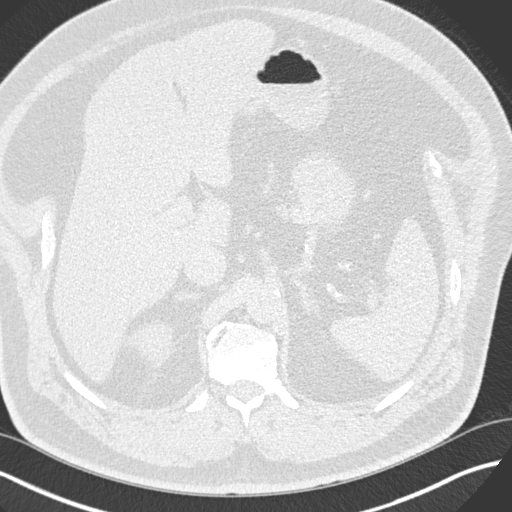
[im 26/165  lung]
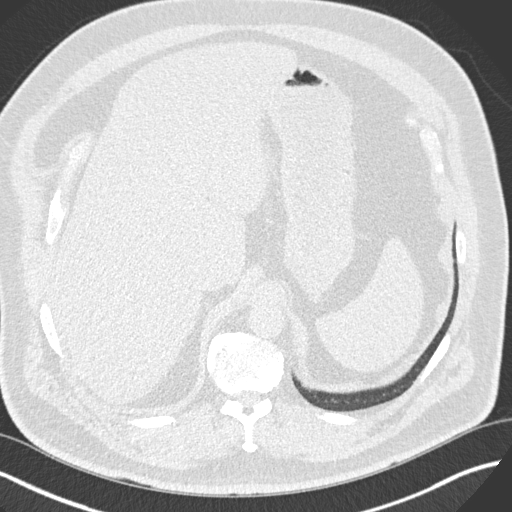
[im 38/165  lung]
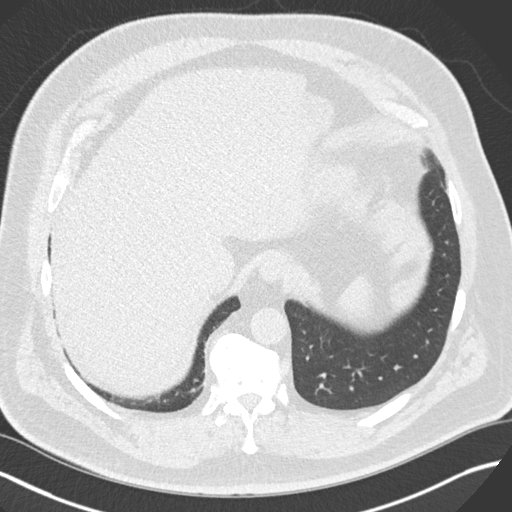
[im 51/165  lung]
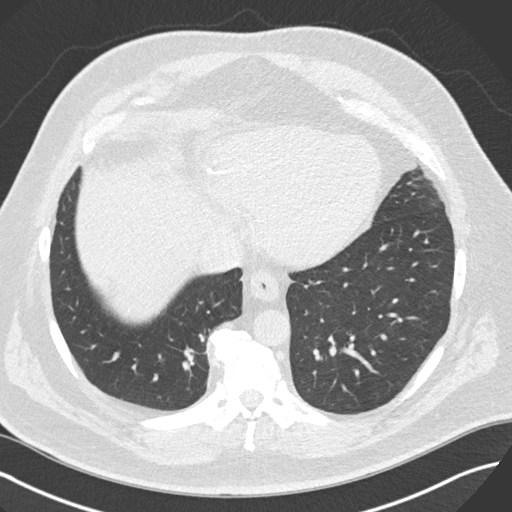
[im 64/165  mediastinal]
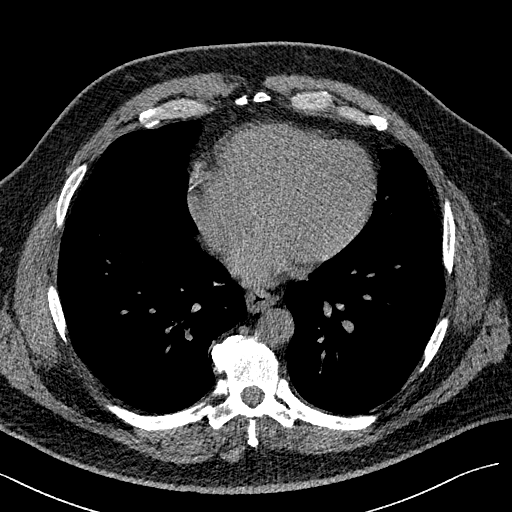
[im 64/165  lung]
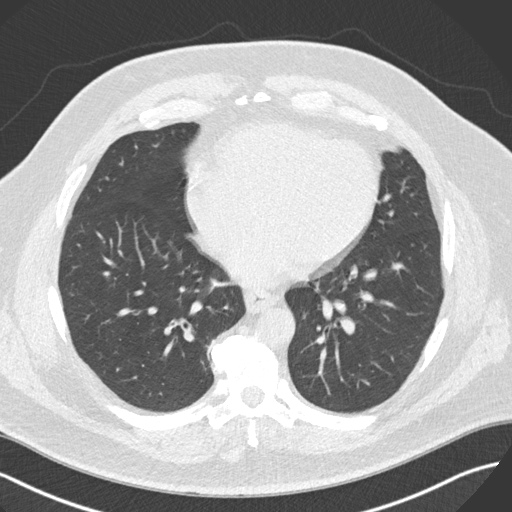
[im 76/165  lung]
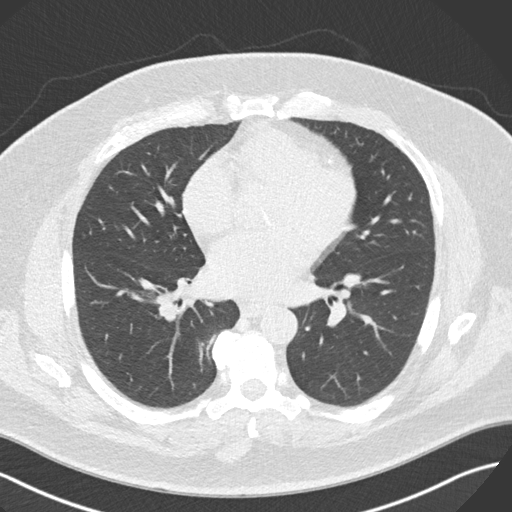
[im 89/165  lung]
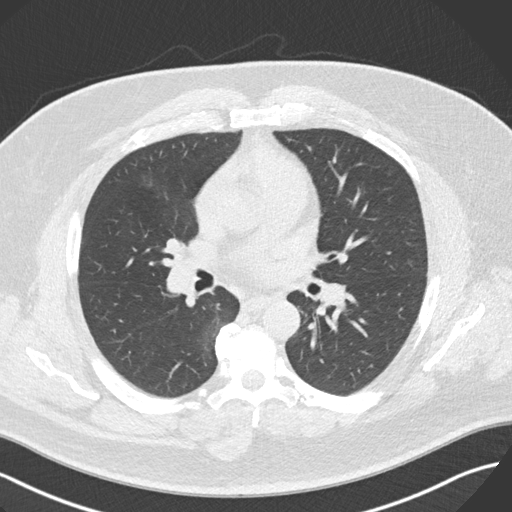
[im 101/165  lung]
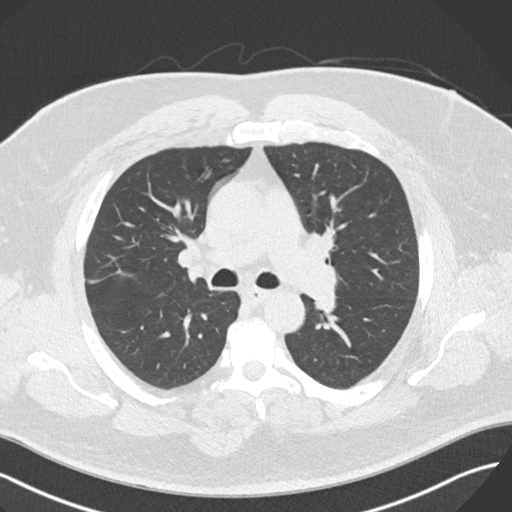
[im 114/165  mediastinal]
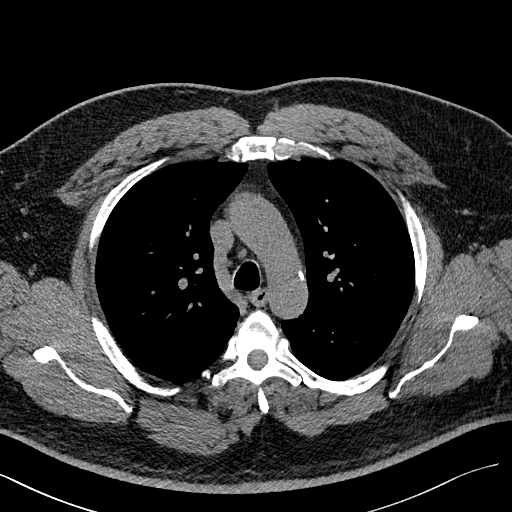
[im 114/165  lung]
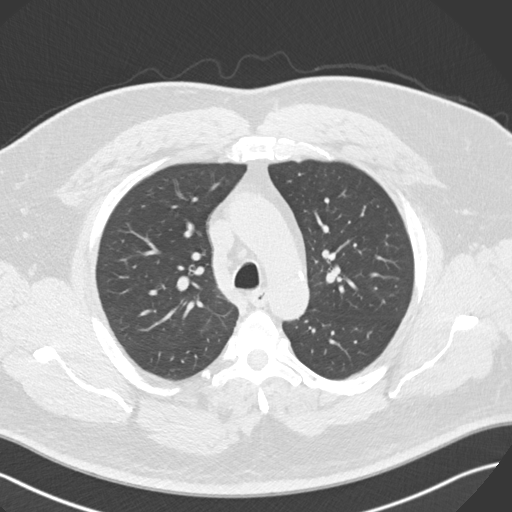
[im 127/165  lung]
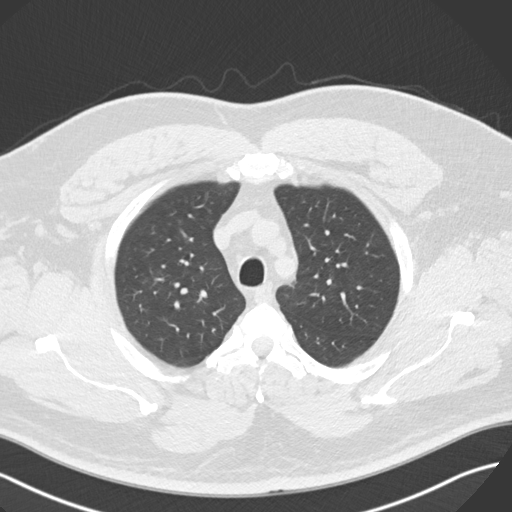
[im 139/165  lung]
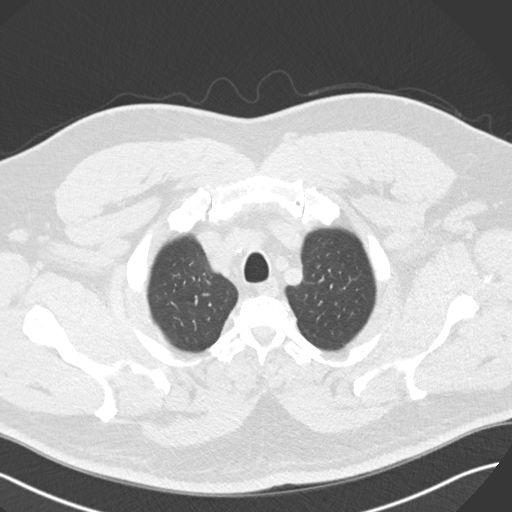
[im 152/165  lung]
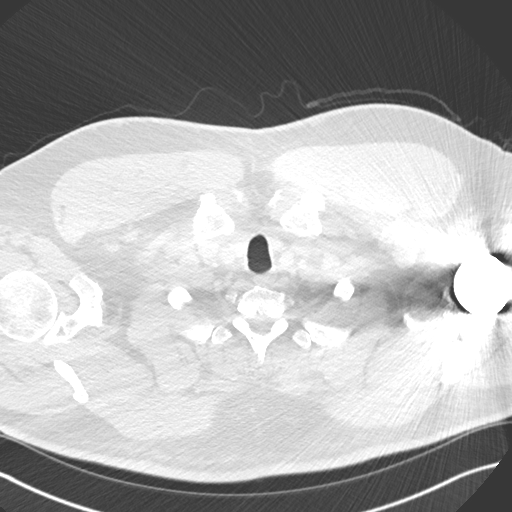

[Series 5: coronal · coronal · 0.74mm/px · 3 of 148 slices shown]
[im 30/148  lung]
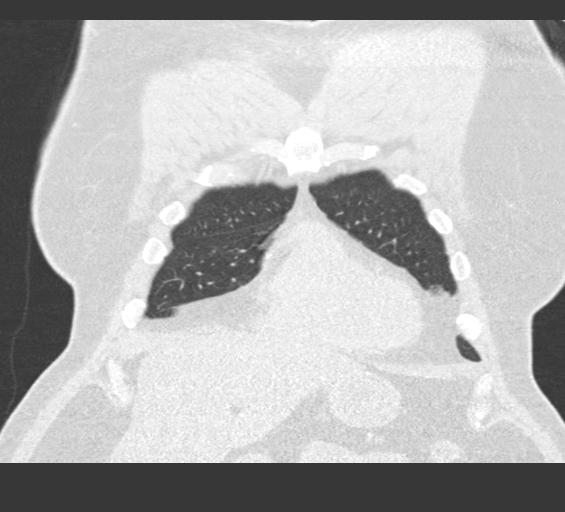
[im 59/148  lung]
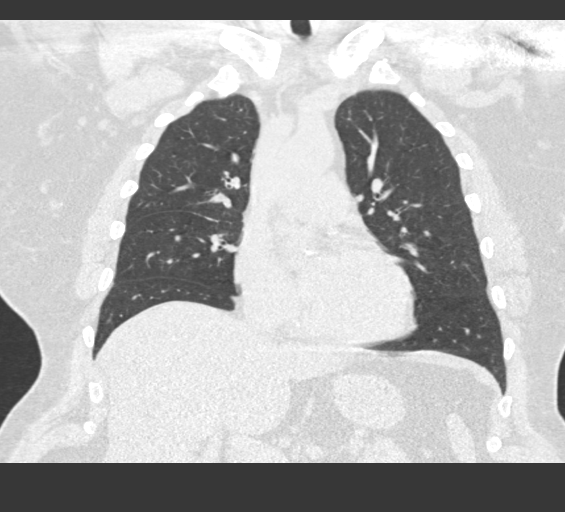
[im 89/148  lung]
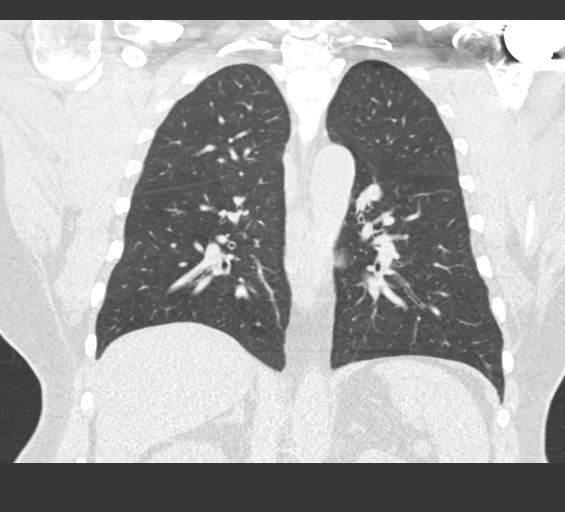

[15 of 36 positions shown; findings below may reference images not displayed]

FINDINGS: Mediastinum/Nodes: No axillary supraclavicular adenopathy. No
mediastinal adenopathy. Coronary artery calcifications noted. No
pericardial fluid. Diffuse esophageal wall thickening distally is
not changed.

Lungs/Pleura: 6 mm RIGHT lower lobe nodule is unchanged. No new
nodularity.

Upper abdomen: Limited view of the liver, kidneys, pancreas are
unremarkable. Normal adrenal glands.

Musculoskeletal: No aggressive osseous lesion.
IMPRESSION: 1. Stable RIGHT lower lobe pulmonary nodule over 20 month interval
is consists with benign etiology per Fleischner criteria.
2. Coronary artery calcification noted.

## 2017-11-03 ENCOUNTER — Encounter: Payer: Self-pay | Admitting: Pulmonary Disease

## 2017-11-05 ENCOUNTER — Encounter: Payer: Self-pay | Admitting: Pulmonary Disease

## 2017-11-05 ENCOUNTER — Ambulatory Visit (INDEPENDENT_AMBULATORY_CARE_PROVIDER_SITE_OTHER): Payer: 59 | Admitting: Pulmonary Disease

## 2017-11-05 VITALS — BP 140/82 | HR 70 | Temp 97.8°F | Ht 71.0 in | Wt 313.2 lb

## 2017-11-05 DIAGNOSIS — M150 Primary generalized (osteo)arthritis: Secondary | ICD-10-CM

## 2017-11-05 DIAGNOSIS — C61 Malignant neoplasm of prostate: Secondary | ICD-10-CM | POA: Diagnosis not present

## 2017-11-05 DIAGNOSIS — E782 Mixed hyperlipidemia: Secondary | ICD-10-CM

## 2017-11-05 DIAGNOSIS — I10 Essential (primary) hypertension: Secondary | ICD-10-CM | POA: Diagnosis not present

## 2017-11-05 DIAGNOSIS — R931 Abnormal findings on diagnostic imaging of heart and coronary circulation: Secondary | ICD-10-CM | POA: Diagnosis not present

## 2017-11-05 DIAGNOSIS — Z96612 Presence of left artificial shoulder joint: Secondary | ICD-10-CM | POA: Diagnosis not present

## 2017-11-05 DIAGNOSIS — Z96652 Presence of left artificial knee joint: Secondary | ICD-10-CM | POA: Diagnosis not present

## 2017-11-05 DIAGNOSIS — R911 Solitary pulmonary nodule: Secondary | ICD-10-CM

## 2017-11-05 DIAGNOSIS — I350 Nonrheumatic aortic (valve) stenosis: Secondary | ICD-10-CM

## 2017-11-05 DIAGNOSIS — E1165 Type 2 diabetes mellitus with hyperglycemia: Secondary | ICD-10-CM

## 2017-11-05 DIAGNOSIS — Z6841 Body Mass Index (BMI) 40.0 and over, adult: Secondary | ICD-10-CM

## 2017-11-05 DIAGNOSIS — M159 Polyosteoarthritis, unspecified: Secondary | ICD-10-CM

## 2017-11-05 DIAGNOSIS — M8949 Other hypertrophic osteoarthropathy, multiple sites: Secondary | ICD-10-CM

## 2017-11-05 DIAGNOSIS — G4733 Obstructive sleep apnea (adult) (pediatric): Secondary | ICD-10-CM | POA: Diagnosis not present

## 2017-11-05 MED ORDER — DOXAZOSIN MESYLATE 4 MG PO TABS: 4.0000 mg | ORAL_TABLET | Freq: Every day | ORAL | 4 refills | Status: DC

## 2017-11-05 NOTE — Patient Instructions (Addendum)
Today we updated your med list in our EPIC system...    Continue your current medications the same...  We reviewed diet, exercise, wt reduction strategies...   We adjusted your CARDURA (Doxazosin) to one 4mg  tab daily (pref in the eve).  Continue your regular follow up appts w/ DM- DrGherghe, and Urology- DrBorden & DrManning...  Keep up the excellent compliance w/ your CPAP nightly...  Call for any questions or if we can be of service in any way!

## 2017-11-07 DIAGNOSIS — N3941 Urge incontinence: Secondary | ICD-10-CM | POA: Diagnosis not present

## 2017-11-07 DIAGNOSIS — C61 Malignant neoplasm of prostate: Secondary | ICD-10-CM | POA: Diagnosis not present

## 2017-11-07 DIAGNOSIS — N393 Stress incontinence (female) (male): Secondary | ICD-10-CM | POA: Diagnosis not present

## 2017-11-07 DIAGNOSIS — N5231 Erectile dysfunction following radical prostatectomy: Secondary | ICD-10-CM | POA: Diagnosis not present

## 2017-11-10 MED FILL — VICTOZA 18 MG/3 ML INJECT P: 18 | 30 days supply | Qty: 9 | Fill #1

## 2017-11-11 MED FILL — ACCU-CHEK GUIDE TEST STRIP: 30 days supply | Qty: 100 | Fill #0

## 2017-12-09 MED FILL — VICTOZA 18 MG/3 ML INJECT P: 18 | 90 days supply | Qty: 27 | Fill #0

## 2017-12-09 MED FILL — DOXAZOSIN MESYLATE 2 MG TAB: 2 | 90 days supply | Qty: 180 | Fill #1

## 2017-12-09 MED FILL — LOSARTAN POTASSIUM 100 MG T: 100 | 90 days supply | Qty: 90 | Fill #1

## 2017-12-22 ENCOUNTER — Other Ambulatory Visit: Payer: Self-pay | Admitting: Internal Medicine

## 2017-12-22 MED FILL — INVOKANA 100 MG TABLET: 100 | 90 days supply | Qty: 90 | Fill #0

## 2017-12-22 MED FILL — AMLODIPINE BESYLATE 10 MG T: 10 | 90 days supply | Qty: 90 | Fill #1

## 2017-12-29 MED FILL — UNIFINE PENTIPS 31GX3/16": 31G X 5 MM | 90 days supply | Qty: 100 | Fill #1

## 2017-12-29 MED FILL — UNIFINE PENTIPS 31GX3/16: 31G X 5 MM | 90 days supply | Qty: 100 | Fill #1

## 2018-01-02 ENCOUNTER — Other Ambulatory Visit: Payer: Self-pay | Admitting: Pulmonary Disease

## 2018-01-02 MED FILL — HYDROCHLOROTHIAZIDE 25 MG T: 25 | 90 days supply | Qty: 90 | Fill #0

## 2018-01-09 ENCOUNTER — Telehealth: Payer: Self-pay | Admitting: Pulmonary Disease

## 2018-01-09 NOTE — Telephone Encounter (Signed)
Checked with SN about OV not showing in my chart and Patient being concerned about insurance credit.  SN is going to finish office note and sign off visit. Patient notified that SN is aware that OV is not showing in My Chart and that it will be signed.

## 2018-01-09 NOTE — Progress Notes (Signed)
Subjective:     Patient ID: Rodney Hayes, male   DOB: 02-20-1951, 67 y.o.   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   I have reviewed the extensive old records in [place] and establish the following problem list and DATA tabulation>>   DATA:   ~  2DEcho 09/03/11 showed mild LVH, mild focal asal hypertrophy of septum, norm LVF w/ EF=60-65%, norm wall motion, Gr1DD, mild calcif of AoV leaflets- no AS, trivAI, MV- wnl, mild LAdil, norm PAsys...  ~  CXR 01/06/13 showed heart at upper lim of norm, min peribronch thickening otherw clear lungs, NAD, Tspine osteophytes...  ~  EKG 02/07/14 showed NSR w/ occ PVCs, poor R prog V1-3, 1st degree AVB...  ~  Cardiac CT 04/01/14 showed very high calcium score=1847; norm Ao root, AoV trileaflet, Lmain=ok, LAD plaque worst in mid-vessel ~70%, 1st diag ~50%, midRCA is heavily calcif but can't eval stenosis due to motion; CATH was rec- but pt declined... Additional findings: 550m RLL nodule medially,  ~  Exercise Treadmill Test 04/2014> 639m, 7Mets, HR= 73-139 (89%max target), +HBP response, no CP, no EKG changes... ~  CT Chest 10/11/14 showed coronary calcif, sm nodes, 50m41mLL nodule (it measured 6mm38m2015) & airway plugging in RLL, min linear scarring RUL, left shoulder arthritis & thoracic spondylosis,   PROBLEM LIST:     OSA on CPAP> cannot find prev sleep study in EPIC ([facility]), he is on CPAP 12, tolerates well & keeps up w/ mask/ tubing changes Q6mo,95moellent compliance/ rests well/ no daytime sleepiness issues...     Pulmonary nodule on CT Chest, ex-smoker, Hx +PPD, remote hx asthma/ alergies as child> Ex-smoker approx 40pk-yrs & quit 1994; has not required resp meds; CT Chest 9/15 & 3/16 as above (50mmRL10module) f/u due 3/17; there is a report  of a pos PPD in the past & not treated w/ INH...    HBP, Heart murmur at base> on Amlod10, Cardura2Bid, Losar100, HCT25; BP= 122/76 today, usually sl higher at home; 2DEcho w/ mild LVH & ETT w/ hypertensive response; he declined to take BBlocker rec by DrHopper 8/16; denies CP, palpit, SOB, etc...     Coronary calcium score >400... on ASA81; he has +FamHx CAD w/ CABG in father, coronary calcif seen on CTChest & a very high coronary calcium score on CardiacCT (1847)([id number] rec to have CATH but declined- he does not have CP & is very active w/ exercise program; neg ETT 10/15;  I too feel that he should have diagnostic cath w/ his mult coronary risk factors & we discussed this, but he respectfully declined...    Venous insuffic> trophic changes of VI on exam, hx intermittent edema on HCT25 daily & wears support hose...    Hx hyperlipidemia> remote labs showed TG up to 372 in past, TChol & LDL have always been good;  [facility]  FLP 2/16 showed TChol 101, TG 104, HDL 38, LDL 43 on diet alone; FLP 1/17 showed TChol 147, TG 192, HDL 39, LDL 70;  He knows that low fat/ low carb diet 7 wt loss will bring TG back to normal...    DM2> on Metform500-2Bid, Onglyza5; Urine microalb & renal function  normal; no retinopathy; recent accuchecks= 170-280 at home; POC A1c values reports >8.0; Labs 07/2015 showed FBS=183, A1c=9.6; we discussed additional meds but he admits to poor diet over the last 17mo& wants 341morial of vigorous diet/ continued exercise/ and weight reduction w/ repeat labs...    Obesity> despite his best efforts weight has remained ~300# & we reviewed diet/ exercise/ wt reduction strategies; strive for 20# increments...     Hx colon polyps, divertics> last colonoscopy 06/2009 by DrGessner showed 40m370molyp in mid-transverse colon (adenomatous), mild divertics; he is overdue for f/u colon & will contact GI at his convenience...     Hx HepB infection> from needle stick exposure  1978 (renal Tx pt), treated conservatively & resolved, LFTs have all been wnl since then...     DJD, s/p L-TKR, s/p L-shoulder replacement> followed by DrAlusio & DrNorris...  EXAM shows Afeb, VSS, O2sat=94% on RA;  HEENT- neg, mallampati3;  Chest- clear w/o w/r/r;  Heart- RR gr1-2/6 SEM at base w/o r/g;  Abd- obese,soft, nontender;  Ext- VI changes w/o c/c/e;  Neuro- intact...  LABS 07/2015>  FLP- ok on diet alone w/ good Chol but TG up to 192 (needs better diet);  Chems ok x BS=183, A1c=9.6 (needs additional meds);  CBC, TSH= wnl;  PSA=3.82 representing incr PSA velocity & rec recheck in 70mo40moDEcho 07/2015>  Done 08/17/15> norm LVF w/ EF=55-60%, no regional wall motion abn, norm diastolic function, MILD AS (mildly thickened & calcif leaflets- mean gradient=10, peak=21), norm RV but PAsys ~32mm60m. IMP/PLAN>>  SteveRichardson Landryere to establish with us, pKoreav PCP= DrHopper, f/u labs and 2DEcho are pending;  Concerns include weight, BP, coronary art dis and mult coronary risk factors;  He has an incidental 7mm R93mnodule seen on prev CT & f/u due 09/2015;  We discussed TG, BS/A1c, & PSA as above- he is committed to low fat/ low carb diet & wt reduction w/ recheck FLP, BS/A1c in 6387mo; [id number] need a f/u PSA in 70mo;  [id number] up screening colon is overdue as well & he will contact DrGessner at his earliest convenience...  ADDENDUM>>  CT Chest 11/20/15 showed stable 6mm nodu99min RLL no change over 52mo inte38mo no new pulm nodules, no adenopathy, +coronary calcif as before, no change in diffuse distal esoph wall thickening...  ADDENDUM>>  Pt did not return for planned f/u labs in April2017;  I received [facility] from [facility] that pt wanted to change his Metform500-2Bid +Onglyza5/d regimen back to [place] tab Qam because he thought it was more effective for him... I sent a note to Pt requesting f/u FASTING blood work before making a decision about decreasing his med dose vs poss  referral to Endocrine/ DM specialist...  >>[name]-- I received a note from the [facility] f[place] Metform500[place] return to [place]ze[place]1 tab daily (this is obviously 1/2 of your current dose);  I've attached a copy of our 07/2015 CPX- & note that Wt was 303#, BS=183, A1c=9.6 and the plan was to recheck the fasting blood work in 6387mo on the770mos + vigorous diet/ exercise effort-- we are clearly overdue. & in addition we wanted to recheck the PSA at 70mo since i15mos 3.82 in Jan2017...   The [facility] people have been seeing you every 6387mo & includ240mohis data>>  09/2015>  Wt=292#     01/2016>  Wt=301#     05/2016>  Wt=306#  How about we have you come by the office, or [facility] one morning at your convenience for f/u FASTING blood work before we make any changes...  ~  August 19, 2016:  1year ROV & medical follow up visit>  Rodney Hayes returns for a yearly CPX feeling well, no complaints or concerns; we reviewed the following medical problems during today's office visit >>     OSA on CPAP> cannot find prev sleep study in EPIC (ordered by [facility], read by DrClance), he is on CPAP 12, tolerates well & keeps up w/ mask/ tubing changes Q54mo he reports excellent compliance (can't sleep w/o it)/ rests well/ no daytime sleepiness issues; his machine is now 69yrold & we will request a new Res-Med S10 [facility])...    Pulmonary nodule on CT Chest, ex-smoker, Hx +PPD, remote hx asthma/ alergies as child> Ex-smoker approx 40pk-yrs & quit 1994; has not required resp meds; CT Chest 9/15, 3/16, & 5/17 as above (6-62m70mL nodule w/o change); there is a report of a pos PPD in the past & not treated w/ INH; he denies cough, sput, hemoptysis, SOB, CP, etc...    HBP, Heart murmur at base=> mild AS on 2D 2/17> on Amlod10, Cardura2Bid, Losar100, HCT25; BP= 154/72 today, similar at home; prev 2DEcho w/ mild LVH & ETT w/ hypertensive response;  he declined to take BBlocker rec by DrHopper 8/16; denies CP, palpit, SOB, edemaq, etc; 2DEcho 2/17 showed norm EF, no RWMA, but mild AS...     Coronary calcium score >400... on ASA81; he has +FamHx CAD w/ CABG in father, coronary calcif seen on CTChest & a very high coronary calcium score on CardiacCT (18(4580he was rec to have CATH but declined- he does not have CP & is very active w/ exercise program; neg ETT 10/15;  I too feel that he should have diagnostic cath w/ his mult coronary risk factors & we discussed this, but he respectfully declined again...    Venous insuffic> trophic changes of VI on exam, hx intermittent edema on HCT25 daily & wears support hose...    Hx hyperlipidemia> remote labs showed TG up to 372 in past, TChol & LDL have always been good;  [facility];  FLP 2/18 on diet alone showed TChol 166, TG 205, HDL 44, LDL 89;  He knows that low fat/ low carb diet & wt loss will bring TG back to normal...    DM2> on Metform500-2Bid, Onglyza5; Urine microalb & renal function normal; no retinopathy; recent accuchecks= 170-280 at home; POC A1c values reports >8.0; Labs 2/18 showed FBS=233, A1c=9.9; we discussed additional meds vs referral to Endocrine- DrGherghe; we decided to change Onglyza to [place]8/d & set up f/u appt w/ Endocrine.    Obesity> despite his best efforts weight has remained ~300# & we reviewed diet/ exercise/ wt reduction strategies; strive for 20# increments...     Hx colon polyps, divertics> last colonoscopy 06/2009 by DrGessner showed 4mm33mlyp in mid-transverse colon (adenomatous), mild divertics; he is overdue for f/u colon & will contact GI at his convenience...     Hx HepB infection> from needle stick exposure 1978 (renal Tx pt), treated conservatively & resolved, LFTs have all been wnl since then...     Elevated PSA>  Labs 1/17 showed PSA=3.82 & he was asked to recheck in 9mo;61moA repeated 2/18 = 4.51 & we will refer to  Urology for  their exam & follow up...    DJD, s/p L-TKR, s/p L-shoulder replacement> followed by DrAlusio & DrNorris... EXAM shows Afeb, VSS, O2sat=95% on RA;  HEENT- neg, mallampati3;  Chest- clear w/o w/r/r;  Heart- RR gr1-2/6 SEM at base (AS) w/o r/g;  Abd- obese,soft, nontender;  Ext- VI changes w/o c/c/e;  Neuro- intact...  LABS 08/16/16>  FLP- ok x TG=202;  Chems- ok x BS=233, A1c=9.9;  PSA=4.51... IMP/PLAN>>  The above is reviewed w/ Rodney Hayes-- OSA & we will request a new ResMed S10 machine for him thru [facility];  Hi coronary calcium score, coronary art calc on CTs, & he's been rec to have cath but declines (he remains very active, exercising, etc but has mult coronary risk factors);  Chol is ok on diet but he needs better low fat diet & wt reduction for the TG;  DM control is poor & he has resisted med changes or consideration of insulin- he agrees to change Onglyza to [facility] 1.8/d & will establish w/ DM/Endocrine DrGherghe;  F/u colonoscopy is overdue w/ DrGessner & he will set this up at his convenience;  PSA is elev at 4.51 (slow steady increase) & we will refer to Urology for their analysis, ?Bx...   ADDENDUM>> Pt received new CPAP machine> ResMed S10, Air/ Auto, set CPAP 14cmH2O-- CPAP Download from 2/21 - 10/03/16 showed excellent compliance on his machine w/ CPAP 14cmH2O:  He used the machine 28/30 days (93%), ave 8H/ night, min air leak, and AHI~1/hr... rec to continue same settings and keep current on masks, tubing, etc... call for any issues...  ~  November 04, 2016:  67moROV & SRichardson Landryis here for a required face-to-face visit required by insurance/ DME for his new CPAP as above-- as noted he has severe OSA and required a new machine as per the ADDENDUM => CPAP download on this new deice confirms excellent compliance & efficacy of this CPAP device...     HBP, mild AS, elev coronary calcium score>  on ASA81, Amlod10, Cardura2Bid, Losar100, HCT25; BP= 144/80 and similar at home; there is a pos FamHx of coronary dis &  CARDS-DrBensimhon has prev rec cardiac cath but pt declined siting no CP, very active w/ vigorous exercise at the gym, etc...    Hypertriglyceridemia>  On diet alone, not on meds, hx intol to Lipitor; TChol, HDL, LDLhave been OK; last FLP showed TG=205 on diet/ exercise...    DM2, obesity> on Victoza1.8, Metform500-2Bid, [place]100; followed by DrGherghe & last seen 11/14/16 w/ BS=141 & A1c improved to 7.3; his wt is 306# today & we reviewed diet/ exercise/ wt reduction strategies...    Elev PSA => he has upcoming appt w/ DrBorden for further eval & biopsy...    DJD, s/p L-TKR, s/p L-shoulder replacement> followed by DrAlusio & DrNorris... EXAM shows Afeb, VSS, O2sat=94% on RA;  HEENT- neg, mallampati3;  Chest- clear w/o w/r/r;  Heart- RR gr1-2/6 SEM at base (AS) w/o r/g;  Abd- obese,soft, nontender;  Ext- VI changes w/o c/c/e;  Neuro- intact... IMP/PLAN>>  SRichardson Landryis stable & awaiting his appt w/ DrBorden regarding elev PSA reading;  The main purpose of this face-to-face meeting was to satis criteria from insurance/DME regarding his new CPAP machine which is confirmed by download to be effective w/ excellent compliance...    ~  November 05, 2017:  1 year RShade GapCPX>  SRichardson Landryhad a very eventful 2018 w/ prostate cancer surg and established DM care thru [facility],  DrGherghe... We reviewed the following interval medical notes in Epic>      Pt saw UROLOGY- DrBorden> consult 11/08/16, prostate bx 11/19/16, surgery 01/2017, postop follow up>  Bx +Gleason 4+3=7 adenoCa in 3/12 cores in L-base & mid;  Surg= robotic assisted laparoscopic radical prostatectomy, PATH= tumor involves left lobe, extraprostatic extension present, perineural invasion present, resection margins neg, all LNs were NEG; they continue to monitor his progress & trend his PSA...    He saw RAD-[facility], DrManning on 07/14/17>  Prostate cancer (Gleason 4+3, pre-op PSA=5.14), s/p radical prostatectomy 7/18 w/ extracapsular extension & post-op PSA  0.10; they rec salvage XRT- pt is holding off for now.    He saw ENDOCRINE- DrGherghe last 10/23/17>  DM2 on Metform1000Bid, Victoza1.40m/d, Invokana100; A1c values in low 7's & 10/2017=7.2; note reviewed- Cr=0.91, Chol- ok but TG=205, Wt=311#, BMI=42; stressed diet & exerc... We reviewed the following medical problems during today's office visit>      OSA on CPAP> cannot find prev sleep study in EPIC [facility] he is on CPAP 12, tolerates well & keeps up w/ mask/ tubing changes Q654mohe reports excellent compliance (can't sleep w/o it)/ rests well/ no daytime sleepiness issues); he received a new Res-Med S10 machine via [facility] (he does not want ramp feature) [id number] 3/24-4/22/19 shows excellent compliance (28/30 days, 8H/night, CPAP14, AHI=0.4).    Pulmonary nodule on CT Chest, ex-smoker, Hx +PPD, remote hx asthma/ alergies as child> Ex-smoker approx 40pk-yrs & quit 1994; has not required resp meds; CT Chest 9/15, 3/16, & 5/17 as above (6-37m26mL nodule w/o change); there is a report of a pos PPD in the past & not treated w/ INH; he denies cough, sput, hemoptysis, SOB, CP, etc...    HBP, Heart murmur at base=> mild AS on 2D 2/17> on Amlod10, Cardura2Bid, Losar100, HCT25; BP= 140/82 today, similar at home; prev 2DEcho w/ mild LVH & ETT w/ hypertensive response; he declined to take BBlocker rec by DrHopper 8/16; denies CP, palpit, SOB, edemaq, etc; 2DEcho 2/17 showed norm EF, no RWMA, but mild AS...     Coronary calcium score >400... on ASA81; he has +FamHx CAD w/ CABG in father, coronary calcif seen on CTChest & a very high coronary calcium score on CardiacCT (18(4888he was rec to have CATH but declined- he does not have CP & is very active w/ exercise program; neg ETT 10/15;  I too feel that he should have diagnostic cath w/ his mult coronary risk factors & we discussed this, but he respectfully declined again...    Venous insuffic> trophic changes of VI on exam, hx  intermittent edema on HCT25 daily & wears support hose...    Hx hyperlipidemia> remote labs showed TG up to 372 in past, TChol & LDL have always been good;  [facility];  FLP 2/18 on diet alone showed TChol 166, TG 205, HDL 44, LDL 89;  He knows that low fat/ low carb diet & wt loss will bring TG back to normal...    DM2> followed by DrGherghe on MetformER 2000Qhs, Victoza1.8, Invokana100; Urine microalb & renal function normal; no retinopathy; recent A1c=7.2 and he follows up w/ Endocrine Q4mo70mo   Obesity> despite his best efforts weight has remained ~300# & we reviewed diet/ exercise/ wt reduction strategies; strive for 20# increments...     Hx colon polyps, divertics> last colonoscopy 06/2009 by DrGessner showed 4mm 71myp in mid-transverse colon (adenomatous), mild divertics;  he is overdue for f/u colon & will contact GI at his convenience...     Hx HepB infection> from needle stick exposure 1978 (renal Tx pt), treated conservatively & resolved, LFTs have all been wnl since then...     Elevated PSA, Prostate cancer>  PSA Feb2018= 4.51 & referred to DrBorden- repeat PSA 5/18= 5.14, Bx +Gleason 4+3=7 adenoCa in 3/12 cores in L-base & mid;  Surg 7/18= robotic assisted laparoscopic radical prostatectomy, PATH= tumor involves left lobe, extraprostatic extension present, perineural invasion present, resection margins neg, all LNs were NEG; they continue to monitor his progress & trend his PSA w/ plans for XRT by DrManning if rising...    DJD, s/p L-TKR, s/p L-shoulder replacement> followed by DrAlusio & DrNorris... EXAM shows Afeb, VSS, O2sat=95% on RA;  HEENT- neg, mallampati3;  Chest- clear w/o w/r/r;  Heart- RR gr1-2/6 SEM at base (AS) w/o r/g;  Abd- obese,soft, nontender;  Ext- VI changes w/o c/c/e;  Neuro- intact...  He had "doctor-day-labs" at the [place] will send copy for the record...  Last Labs in Epic7/2018 reviewed...   Last CXR 01/14/17>  Heart size is wnl, lungs  clear- NAD, degen changes in Tspine & prosthetic left humeral head...   EKG 01/14/17 showed NSR, rate62, borderline 1st degree AVB, otherw wnl/ NAD... IMP/PLAN>>  Rodney Hayes will send [place] a copy of his doctor-day-labs;  We reviewed plans for diet/ exercise/ wt reduction;  We adjusted his Cardura to 5m Qhs & rec that he take his Losar & HCT in the AM and his Amlod & Cardura in the PM;  He will maintain f/u w/ DrGherghe for DM/endocrine, and DrsBorden/ MTammi Klippelfor prostate;  Continue excellent compliance w/ CPAP, call for any problems...     Past Medical History:  Diagnosis Date  . Arthritis   . Asthma    as a child  . BPH (benign prostatic hyperplasia)   . Diabetes mellitus    average fasting 140s type 2  . DJD (degenerative joint disease)   . Heart murmur    dr. bHaroldine Laws . Hepatitis    hep B  . Hyperlipidemia   . Hypertension   . Nonspecific abnormal electrocardiogram (ECG) (EKG)   . Obesity   . Prostate cancer ([place]   . Shortness of breath dyspnea    exertion  . Sleep apnea    C-PAP    Past Surgical History:  Procedure Laterality Date  . Biceps tendon reinsertion    . colonoscopy with polypectomy     adenomatous; Dr GCarlean Purl . JOINT REPLACEMENT Left    knee  . left knee arthroscopy    . LYMPHADENECTOMY Bilateral 01/23/2017   Procedure: BILATERAL LYMPHADENECTOMY;  Surgeon: BRaynelle Bring MD;  Location: [facility];  Service: Urology;  Laterality: Bilateral;  . PROSTATECTOMY     01/23/17 Dr. BAlinda Money . ROBOT ASSISTED LAPAROSCOPIC RADICAL PROSTATECTOMY N/A 01/23/2017   Procedure: XI ROBOTIC ASSISTED LAPAROSCOPIC RADICAL PROSTATECTOMY LEVEL 3;  Surgeon: BRaynelle Bring MD;  Location: [facility];  Service: Urology;  Laterality: N/A;  . TONSILLECTOMY AND ADENOIDECTOMY    . TOTAL KNEE ARTHROPLASTY Left 01/11/2013   Procedure: LEFT TOTAL KNEE ARTHROPLASTY;  Surgeon: FGearlean Alf MD;  Location: [facility];  Service: Orthopedics;  Laterality: Left;  . TOTAL SHOULDER ARTHROPLASTY Left 08/12/2014    Procedure: LEFT TOTAL SHOULDER ARTHROPLASTY;  Surgeon: SAugustin Schooling MD;  [facility]  Service: Orthopedics;  Laterality: Left;  . UMBILICAL HERNIA REPAIR      Outpatient  Encounter Medications as of 11/05/2017  Medication Sig  . amLODipine (NORVASC) 10 MG tablet TAKE 1 TABLET (10 MG TOTAL) BY MOUTH EVERY MORNING.  Marland Kitchen doxazosin (CARDURA) 4 MG tablet Take 1 tablet (4 mg total) by mouth daily.  Marland Kitchen glucose blood (ACCU-CHEK GUIDE) test strip Use as instructed  . Lancets Misc. (ACCU-CHEK FASTCLIX LANCET) KIT Use as directed  . liraglutide (VICTOZA) 18 MG/3ML SOPN Inject 0.3 mLs (1.8 mg total) into the skin daily.  Marland Kitchen losartan (COZAAR) 100 MG tablet TAKE 1 TABLET (100 MG TOTAL) BY MOUTH DAILY.  . metFORMIN (GLUCOPHAGE-XR) 500 MG 24 hr tablet Take 2,000 mg by mouth at bedtime.   . naproxen sodium (ALEVE) 220 MG tablet Take 220 mg by mouth.  . psyllium (METAMUCIL) 58.6 % packet Take 1 packet by mouth daily.  Marland Kitchen UNIFINE PENTIPS 31G X 5 MM MISC USE FOR VICTOZA DAILY AS DIRECTED  . [DISCONTINUED] canagliflozin (INVOKANA) 100 MG TABS tablet Take 1 tablet (100 mg total) by mouth daily before breakfast.  . [DISCONTINUED] doxazosin (CARDURA) 2 MG tablet TAKE 1 TABLET (2 MG TOTAL) BY MOUTH 2 (TWO) TIMES DAILY.  . [DISCONTINUED] doxazosin (CARDURA) 4 MG tablet Take 4 mg by mouth daily.  . [DISCONTINUED] hydrochlorothiazide (HYDRODIURIL) 25 MG tablet Take 1 tablet (25 mg total) by mouth daily.  . [DISCONTINUED] losartan (COZAAR) 100 MG tablet Take 1 tablet (100 mg total) by mouth daily.   No facility-administered encounter medications on file as of 11/05/2017.     Allergies  Allergen Reactions  . Cymbalta [Duloxetine Hcl]     As per e-mail 12/13/11: Insomnia, lethargy, inability to ejaculate  . Tetracycline     REACTION: PHOTOSENSITIVITY  He is also sensitive to STATIN meds w/ severe arthralgias...    Immunization History  Administered Date(s) Administered  . Influenza Split 04/14/2017  .  Influenza Whole 05/15/2012, 04/14/2013  . Influenza,inj,Quad PF,6+ Mos 04/18/2016  . Influenza-Unspecified 04/15/2015  . Tdap 07/15/2008    Family History  Problem Relation Age of Onset  . Lung cancer Mother        smoker/lung ca and colon?  . Diabetes Father   . Coronary artery disease Father        CBAG in late 25s  . Cancer Father        prostate  . Stroke Neg Hx   Father had HBP, DM, CAD-s/pCABG, prostate ca... Mother had DM, nonsmall cell lung cancer   Current Medications, Allergies, Past Medical History, Past Surgical History, Family History, and Social History were reviewed in [employer] record.   Review of Systems             All symptoms NEG except where BOLDED >>  Constitutional:  F/C/S, fatigue, anorexia, unexpected weight change. HEENT:  HA, visual changes, hearing loss, earache, nasal symptoms, sore throat, mouth sores, hoarseness. Resp:  cough, sputum, hemoptysis; SOB, tightness, wheezing. Cardio:  CP, palpit, DOE, orthopnea, edema (intermittently) GI:  N/V/D/C, blood in stool; reflux, abd pain, distention, gas. GU:  dysuria, freq, urgency, hematuria, flank pain, voiding difficulty. MS:  joint pain, swelling, tenderness, decr ROM; neck pain, back pain, etc. Neuro:  HA, tremors, seizures, dizziness, syncope, weakness, numbness, gait abn. Skin:  suspicious lesions or skin rash. Heme:  adenopathy, bruising, bleeding. Psyche:  confusion, agitation, sleep disturbance (OSA), hallucinations, anxiety, depression suicidal.   Objective:   Physical Exam       Vital Signs:  Reviewed...   General:  WD, obese, 67 y/o WM in NAD;  alert & oriented; pleasant & cooperative... HEENT:  [place]/AT; Conjunctiva- pink, Sclera- nonicteric, EOM-wnl, PERRLA, EACs-clear, TMs-wnl; NOSE-clear; THROAT-clear & wnl.  Neck:  Supple w/ fair ROM; no JVD; normal carotid impulses w/o bruits; no thyromegaly or nodules palpated; no lymphadenopathy.  Chest:  Clear to P & A;  without wheezes, rales, or rhonchi heard. Heart:  Regular Rhythm; norm S1 & S2, Gr1-2/6 SEM at base, no rubs or gallops detected... Abdomen:  Obese, soft & nontender- no guarding or rebound; normal bowel sounds; no organomegaly or masses palpated. Ext:  +arthritic changes w/ L-TKR & L-shoulder replacement; +venous insuffic changes, no VV or edema;  Pulses intact w/o bruits. Neuro:  CNs II-XII intact; motor testing normal; sensory testing normal; gait normal & balance OK. Derm:  No lesions noted; no rash etc. Lymph:  No cervical, supraclavicular, axillary, or inguinal adenopathy palpated.   Assessment:         OSA on CPAP> on CPAP 14 w/ new ResMed S10 machine -- CPAP download reveals excellent compliance 7 efficacy w/ AHI=1, continue same settings...     Pulmonary nodule on CT Chest, ex-smoker, Hx +PPD, remote hx asthma/ alergies as child> not requiring pulm meds; f/u CTChest is due 09/2015...    HBP, Heart murmur at base> on Amlod10, Cardura2Bid, Losar100, HCT25; repeat 2DEcho=> pending    Coronary calcium score >400... on ASA81; he has +FamHx CAD w/ CABG in father, coronary calcif seen on CTChest & a very high coronary calcium score on CardiacCT (2671); he was rec to have CATH but declined- he does not have CP & is very active w/ exercise program; neg ETT 10/15...    Venous insuffic> aware- on HCT25, low sodium, wears support hose, etc...    Hx hyperlipidemia> on diet alone; Cards wanted him on Statin but he was intol to Lip20 w/ joint pain; Labs 07/2015 confirms good Chol values but TG=192 off diet (this will ret to norm on low fat wt reducing diet).    DM2> on Metform500-2Bid, Onglyza5; Labs 07/2015 showed FBS=183, A1c=9,6 & he admits to poor diet recently & is committed to low carb diet 7 wt reduction now=> plan f/u labs in 81mo..    Obesity> despite intermittently losing some weight, he has not been able to keep it off; we reviewed diet, exercise, weight reduction strategies including bariatric  options...    Hx colon polyps, divertics> followed by DRance Muir he is overdue for f/u colon & will set this up at his earliest convenience...    Hx HepB infection> from needle stick exposure 1978 (renal Tx pt), treated conservatively & resolved, LFTs have all been wnl since then...     Elev PSA>  PSA up to 4.51 [id number]& he is referred to Urology...    DJD, s/p L-TKR, s/p L-shoulder replacement> followed by DrAlusio & DrNorris...     Plan:     Patient's Medications  New Prescriptions   No medications on file  Previous Medications   AMLODIPINE (NORVASC) 10 MG TABLET    TAKE 1 TABLET (10 MG TOTAL) BY MOUTH EVERY MORNING.   GLUCOSE BLOOD (ACCU-CHEK GUIDE) TEST STRIP    Use as instructed   LANCETS MISC. (ACCU-CHEK FASTCLIX LANCET) KIT    Use as directed   LIRAGLUTIDE (VICTOZA) 18 MG/3ML SOPN    Inject 0.3 mLs (1.8 mg total) into the skin daily.   LOSARTAN (COZAAR) 100 MG TABLET    TAKE 1 TABLET (100 MG TOTAL) BY MOUTH DAILY.   METFORMIN (GLUCOPHAGE-XR) 500 MG 24  HR TABLET    Take 2,000 mg by mouth at bedtime.    NAPROXEN SODIUM (ALEVE) 220 MG TABLET    Take 220 mg by mouth.   PSYLLIUM (METAMUCIL) 58.6 % PACKET    Take 1 packet by mouth daily.   UNIFINE PENTIPS 31G X 5 MM MISC    USE FOR VICTOZA DAILY AS DIRECTED  Modified Medications   Modified Medication Previous Medication   DOXAZOSIN (CARDURA) 4 MG TABLET doxazosin (CARDURA) 4 MG tablet      Take 1 tablet (4 mg total) by mouth daily.    Take 4 mg by mouth daily.   HYDROCHLOROTHIAZIDE (HYDRODIURIL) 25 MG TABLET hydrochlorothiazide (HYDRODIURIL) 25 MG tablet      TAKE 1 TABLET BY MOUTH DAILY.    Take 1 tablet (25 mg total) by mouth daily.   INVOKANA 100 MG TABS TABLET canagliflozin (INVOKANA) 100 MG TABS tablet      TAKE 1 TABLET BY MOUTH DAILY BEFORE BREAKFAST.    Take 1 tablet (100 mg total) by mouth daily before breakfast.  Discontinued Medications   DOXAZOSIN (CARDURA) 2 MG TABLET    TAKE 1 TABLET (2 MG TOTAL) BY MOUTH 2 (TWO) TIMES  DAILY.   LOSARTAN (COZAAR) 100 MG TABLET    Take 1 tablet (100 mg total) by mouth daily.

## 2018-01-27 DIAGNOSIS — C61 Malignant neoplasm of prostate: Secondary | ICD-10-CM | POA: Diagnosis not present

## 2018-01-28 MED FILL — OXYCODONE-ACETAMINOPHEN 5-3: 5-325 | 3 days supply | Qty: 12 | Fill #0

## 2018-03-11 ENCOUNTER — Other Ambulatory Visit: Payer: Self-pay | Admitting: Internal Medicine

## 2018-03-11 MED FILL — VICTOZA 18 MG/3 ML INJECT P: 18 | 90 days supply | Qty: 27 | Fill #0

## 2018-03-11 MED FILL — LOSARTAN POTASSIUM 100 MG T: 100 | 90 days supply | Qty: 90 | Fill #2

## 2018-03-11 MED FILL — AMLODIPINE BESYLATE 10 MG T: 10 | 90 days supply | Qty: 90 | Fill #2

## 2018-03-11 MED FILL — INVOKANA 100 MG TABLET: 100 | 90 days supply | Qty: 90 | Fill #1

## 2018-03-11 MED FILL — DOXAZOSIN MESYLATE 2 MG TAB: 2 | 90 days supply | Qty: 180 | Fill #2

## 2018-03-11 MED FILL — metFORMIN HCL 1000 MG TABS: 1000 | 90 days supply | Qty: 180 | Fill #1

## 2018-04-02 ENCOUNTER — Encounter: Payer: Self-pay | Admitting: Internal Medicine

## 2018-04-02 ENCOUNTER — Ambulatory Visit: Payer: 59 | Admitting: Internal Medicine

## 2018-04-02 VITALS — BP 138/68 | HR 60 | Ht 71.0 in | Wt 307.0 lb

## 2018-04-02 DIAGNOSIS — Z23 Encounter for immunization: Secondary | ICD-10-CM | POA: Diagnosis not present

## 2018-04-02 DIAGNOSIS — E782 Mixed hyperlipidemia: Secondary | ICD-10-CM

## 2018-04-02 DIAGNOSIS — Z6841 Body Mass Index (BMI) 40.0 and over, adult: Secondary | ICD-10-CM

## 2018-04-02 DIAGNOSIS — E1165 Type 2 diabetes mellitus with hyperglycemia: Secondary | ICD-10-CM

## 2018-04-02 LAB — POCT GLYCOSYLATED HEMOGLOBIN (HGB A1C): Hemoglobin A1C: 7.2 % — AB (ref 4.0–5.6)

## 2018-04-02 MED ORDER — GLIPIZIDE ER 2.5 MG PO TB24: 5.0000 mg | ORAL_TABLET | Freq: Every day | ORAL | 3 refills | Status: DC

## 2018-04-02 MED FILL — glipiZIDE ER 2.5 MG TB24: 2.5 | 90 days supply | Qty: 180 | Fill #0

## 2018-04-02 NOTE — Patient Instructions (Addendum)
Please continue: - Victoza 1.8 mg daily in am - Invokana 100 mg daily in am - Metformin 2000 mg with dinner  Please start Glipizide ER 2.5 mg before b'fast (may need to increase to 5 mg).  Please return in 4 months with your sugar log.

## 2018-04-02 NOTE — Addendum Note (Signed)
Addended by: Cardell Peach I on: 04/02/2018 03:27 PM   Modules accepted: Orders

## 2018-04-02 NOTE — Progress Notes (Signed)
Patient ID: Jerauld Bostwick Takeda, male   DOB: 1951-03-09, 67 y.o.   MRN: [id number]   HPI: Sevastian Witczak Jump is a 67 y.o.-year-old male, returning for f/u for DM2, dx in 2011, non-insulin-dependent, uncontrolled, without long term complications. Last visit 5 mo ago.  He mentions that he is frequently hungry and eats large portions.  Sugars in the morning are still high.  Last hemoglobin A1c was: Lab Results  Component Value Date   HGBA1C 7.2 10/23/2017   HGBA1C 7.4 07/24/2017   HGBA1C 6.7 (H) 01/14/2017   Pt is on a regimen of - Victoza 1.8 mg daily in am - Invokana 100 mg daily in am - Metformin 2000 mg with dinner Previously on Kombiglyze 2.11-998 mg daily   Pt checks his sugars 1x a day: - am:  146-205, 230 >> 150-183 >> 134-164, 170, 174 - 2h after b'fast: 104, 210 >> 197 >> n/c - before lunch: n/c  - 2h after lunch: n/c >> 172 >> n/c - before dinner: n/c >> 154 >> n/c  - 2h after dinner: n/c >> 153 >> n/c - bedtime: n/c - nighttime: n/c Lowest sugar was 146 >> 134; he has hypoglycemia awareness in the 70s. Highest sugar was 230 >> 174.  Glucometer: AccuChek  Pt's meals are: - Breakfast:  Bacon, 2 bisquits, fruit - Lunch: hamburger, fries - Dinner - 6-8 pm: chicken, salad - Snacks: 4, nuts, candy, cottage cheese  He continues to workout at the gym approximately 3 times a week: Cardio, weights training  no CKD, last BUN/Cr: Lab Results  Component Value Date   BUN 18 01/14/2017   Lab Results  Component Value Date   CREATININE 0.91 01/14/2017  On Losartan 100.  -+ HL; last set of lipids: Lab Results  Component Value Date   CHOL 166 08/16/2016   HDL 44.00 08/16/2016   LDLCALC 70 08/09/2015   LDLDIRECT 89.0 08/16/2016   TRIG 205.0 (H) 08/16/2016   CHOLHDL 4 08/16/2016  No statins 2/2 joint pain.  - last eye exam was in 05/2017: No DR, + cataract  - no numbness and tingling in his feet.  He also has a history of HTN, OSA.  He  has a history of prostate cancer  Gleason 7, + some extra-prostate extension; and had robotic radical prostatectomy in 01/2017.   ROS: Constitutional: no weight gain/no weight loss, no fatigue, no subjective hyperthermia, no subjective hypothermia Eyes: no blurry vision, no xerophthalmia ENT: no sore throat, no nodules palpated in throat, no dysphagia, no odynophagia, no hoarseness Cardiovascular: no CP/no SOB/no palpitations/no leg swelling Respiratory: no cough/no SOB/no wheezing Gastrointestinal: no N/no V/no D/no C/no acid reflux Musculoskeletal: no muscle aches/no joint aches Skin: no rashes, no hair loss Neurological: no tremors/no numbness/no tingling/no dizziness  I reviewed pt's medications, allergies, PMH, social hx, family hx, and changes were documented in the history of present illness. Otherwise, unchanged from my initial visit note.  Past Medical History:  Diagnosis Date  . Arthritis   . Asthma    as a child  . BPH (benign prostatic hyperplasia)   . Diabetes mellitus    average fasting 140s type 2  . DJD (degenerative joint disease)   . Heart murmur    [name]  . Hepatitis    hep B  . Hyperlipidemia   . Hypertension   . Nonspecific abnormal electrocardiogram (ECG) (EKG)   . Obesity   . Prostate cancer ([place])   . Shortness of breath dyspnea  exertion  . Sleep apnea    C-PAP   Past Surgical History:  Procedure Laterality Date  . Biceps tendon reinsertion    . colonoscopy with polypectomy     adenomatous; [name]  . JOINT REPLACEMENT Left    knee  . left knee arthroscopy    . LYMPHADENECTOMY Bilateral 01/23/2017   Procedure: BILATERAL LYMPHADENECTOMY;  Surgeon: [name], MD;  Location: [facility];  Service: Urology;  Laterality: Bilateral;  . PROSTATECTOMY     01/23/17 [name]  . ROBOT ASSISTED LAPAROSCOPIC RADICAL PROSTATECTOMY N/A 01/23/2017   Procedure: XI ROBOTIC ASSISTED LAPAROSCOPIC RADICAL PROSTATECTOMY LEVEL 3;  Surgeon: [name], MD;  Location: [facility];   Service: Urology;  Laterality: N/A;  . TONSILLECTOMY AND ADENOIDECTOMY    . TOTAL KNEE ARTHROPLASTY Left 01/11/2013   Procedure: LEFT TOTAL KNEE ARTHROPLASTY;  Surgeon: [name], MD;  Location: [facility];  Service: Orthopedics;  Laterality: Left;  . TOTAL SHOULDER ARTHROPLASTY Left 08/12/2014   Procedure: LEFT TOTAL SHOULDER ARTHROPLASTY;  Surgeon: [name], MD;  Location: [facility];  Service: Orthopedics;  Laterality: Left;  . UMBILICAL HERNIA REPAIR     Social History   Socioeconomic History  . Marital status: Married    Spouse name: Not on file  . Number of children: Not on file  . Years of education: Not on file  . Highest education level: Not on file  Occupational History  . Not on file  Social Needs  . Financial resource strain: Not on file  . Food insecurity:    Worry: Not on file    Inability: Not on file  . Transportation needs:    Medical: Not on file    Non-medical: Not on file  Tobacco Use  . Smoking status: Former Smoker    Packs/day: 2.00    Years: 30.00    Pack years: 60.00    Types: Cigarettes    Start date: 07/15/1962    Last attempt to quit: 08/15/1992    Years since quitting: 25.6  . Smokeless tobacco: Never Used  . Tobacco comment: smoked 14 -42 up to 2 ppd  Substance and Sexual Activity  . Alcohol use: Yes    Alcohol/week: 4.0 standard drinks    Types: 4 Glasses of wine per week    Comment: Red Wine  . Drug use: No  . Sexual activity: Yes  Lifestyle  . Physical activity:    Days per week: Not on file    Minutes per session: Not on file  . Stress: Not on file  Relationships  . Social connections:    Talks on phone: Not on file    Gets together: Not on file    Attends religious service: Not on file    Active member of club or organization: Not on file    Attends meetings of clubs or organizations: Not on file    Relationship status: Not on file  . Intimate partner violence:    Fear of current or ex partner: Not on file    Emotionally  abused: Not on file    Physically abused: Not on file    Forced sexual activity: Not on file  Other Topics Concern  . Not on file  Social History Narrative  . Not on file   Current Outpatient Medications on File Prior to Visit  Medication Sig Dispense Refill  . amLODipine (NORVASC) 10 MG tablet TAKE 1 TABLET (10 MG TOTAL) BY MOUTH EVERY MORNING. 90 tablet 3  .  doxazosin (CARDURA) 4 MG tablet Take 1 tablet (4 mg total) by mouth daily. 90 tablet 4  . glucose blood (ACCU-CHEK GUIDE) test strip Use as instructed 100 each 12  . hydrochlorothiazide (HYDRODIURIL) 25 MG tablet TAKE 1 TABLET BY MOUTH DAILY. 90 tablet 3  . INVOKANA 100 MG TABS tablet TAKE 1 TABLET BY MOUTH DAILY BEFORE BREAKFAST. 90 tablet 3  . Lancets Misc. (ACCU-CHEK FASTCLIX LANCET) KIT Use as directed 1 kit 5  . losartan (COZAAR) 100 MG tablet TAKE 1 TABLET (100 MG TOTAL) BY MOUTH DAILY. 90 tablet 3  . metFORMIN (GLUCOPHAGE-XR) 500 MG 24 hr tablet Take 2,000 mg by mouth at bedtime.     . naproxen sodium (ALEVE) 220 MG tablet Take 220 mg by mouth.    . psyllium (METAMUCIL) 58.6 % packet Take 1 packet by mouth daily.    Marland Kitchen UNIFINE PENTIPS 31G X 5 MM MISC USE FOR VICTOZA DAILY AS DIRECTED 100 each PRN  . VICTOZA 18 MG/3ML SOPN INJECT 0.3 MLS (1.8 MG TOTAL) INTO THE SKIN DAILY. 72 mL 0   No current facility-administered medications on file prior to visit.    Allergies  Allergen Reactions  . Cymbalta [Duloxetine Hcl]     As per e-mail 12/13/11: Insomnia, lethargy, inability to ejaculate  . Tetracycline     REACTION: PHOTOSENSITIVITY   Family History  Problem Relation Age of Onset  . Lung cancer Mother        smoker/lung ca and colon?  . Diabetes Father   . Coronary artery disease Father        CBAG in late 34s  . Cancer Father        prostate  . Stroke Neg Hx    PE: BP 138/68   Pulse 60   Ht '5\' 11"'  (1.803 m)   Wt (!) 307 lb (139.3 kg)   SpO2 94%   BMI 42.82 kg/m  Wt Readings from Last 3 Encounters:  04/02/18  (!) 307 lb (139.3 kg)  11/05/17 (!) 313 lb 3.2 oz (142.1 kg)  10/23/17 (!) 310 lb 12.8 oz (141 kg)   Constitutional: overweight, in NAD Eyes: PERRLA, EOMI, no exophthalmos ENT: moist mucous membranes, no thyromegaly, no cervical lymphadenopathy Cardiovascular: RRR, No MRG Respiratory: CTA B Gastrointestinal: abdomen soft, NT, ND, BS+ Musculoskeletal: no deformities, strength intact in all 4 Skin: moist, warm, no rashes Neurological: no tremor with outstretched hands, DTR normal in all 4  ASSESSMENT: 1. DM2, non-insulin-dependent, uncontrolled, without long term complications, but with hyperglycemia  2.  Hyperlipidemia  3. Obesity  PLAN:  1. Patient with long-standing, uncontrolled, type 2 diabetes, on metformin, Invokana, and Victoza.  His sugars improved significantly after starting Invokana in the past, which he tolerates well.  He does have increased urination as he is also taking HCTZ but he takes HCTZ after lunch and Invokana in the morning.  He had a setback of his diabetes control and his weight after his prostate cancer surgery when he was eating more and was not able to exercise.  However, since then, he started to go to the gym. At last visit, sugars in the morning was slightly better, but still above target.  He started to adjust his diet since then and to stay away from sweets and fast food.  He did not want to change the regimen at that time but wanted to continue with his exercise and diet.  Since last visit, he lost 5 pounds. -At this visit, sugars are still high in  the morning and he is not checking sugars later in the day.  He mentions that whenever he checks, they are approximately the same throughout the day. -At this visit, we discussed about the option of adding long-term insulin, but he would not want to do this now.  In this case, I suggested to add glipizide extended release in the morning.  We will start with a low dose, since he did have a history of hypoglycemia on  glipizide in the past - I suggested to:  Patient Instructions  Please continue: - Victoza 1.8 mg daily in am - Invokana 100 mg daily in am - Metformin 2000 mg with dinner  Please start Glipizide ER 2.5 mg before b'fast (may need to increase to 5 mg).  Please return in 4 months with your sugar log.   - today, HbA1c is 7.2% (stable) - continue checking sugars at different times of the day - check 1x a day, rotating checks - advised for yearly eye exams >> he is UTD - He would like to wait and have annual labs by PCP - We will give him the flu shot today - Return to clinic in 4 mo with sugar log   2.  Hyperlipidemia - Reviewed latest lipid panel from 2018:LDL at goal, TG slightly high Lab Results  Component Value Date   CHOL 166 08/16/2016   HDL 44.00 08/16/2016   LDLCALC 70 08/09/2015   LDLDIRECT 89.0 08/16/2016   TRIG 205.0 (H) 08/16/2016   CHOLHDL 4 08/16/2016  - cannot use a statin b/c side effects - joint aches.  3. Obesity -Continue Invokana and Victoza which should also help with weight loss -Lost 5 pounds since last visit, despite just coming back from vacation -He is aware he needs to lose more, but he is quite hungry and has difficulty adjusting his portions  Philemon Kingdom, MD PhD [facility]

## 2018-04-06 ENCOUNTER — Other Ambulatory Visit: Payer: Self-pay | Admitting: Pulmonary Disease

## 2018-04-06 ENCOUNTER — Telehealth: Payer: Self-pay | Admitting: Pulmonary Disease

## 2018-04-06 MED ORDER — GLUCOSE BLOOD VI STRP: ORAL_STRIP | 12 refills | Status: DC

## 2018-04-06 MED FILL — ACCU-CHEK GUIDE STRP: 90 days supply | Qty: 100 | Fill #0

## 2018-04-06 NOTE — Telephone Encounter (Signed)
Called patient unable to reach left message to give [place] a call back. Refill has been sent in.

## 2018-04-07 ENCOUNTER — Telehealth: Payer: Self-pay | Admitting: Pulmonary Disease

## 2018-04-07 ENCOUNTER — Other Ambulatory Visit: Payer: Self-pay

## 2018-04-07 ENCOUNTER — Other Ambulatory Visit: Payer: Self-pay | Admitting: *Deleted

## 2018-04-07 MED ORDER — GLUCOSE BLOOD VI STRP: ORAL_STRIP | 12 refills | Status: DC

## 2018-04-07 NOTE — Telephone Encounter (Signed)
Patient is aware nothing further needed at this time.  

## 2018-04-07 NOTE — Telephone Encounter (Signed)
Returned call to [place], [place]. Clarification on Accucheck test strips as daily. Nothing further at this time.

## 2018-04-10 DIAGNOSIS — G4733 Obstructive sleep apnea (adult) (pediatric): Secondary | ICD-10-CM | POA: Diagnosis not present

## 2018-04-13 MED FILL — UNIFINE PENTIPS 31GX3/16: 31G X 5 MM | 90 days supply | Qty: 100 | Fill #2

## 2018-04-20 MED FILL — HYDROCHLOROTHIAZIDE 25 MG T: 25 | 90 days supply | Qty: 90 | Fill #1

## 2018-06-03 DIAGNOSIS — C61 Malignant neoplasm of prostate: Secondary | ICD-10-CM | POA: Diagnosis not present

## 2018-06-04 MED FILL — DOXAZOSIN MESYLATE 2 MG TAB: 2 | 90 days supply | Qty: 180 | Fill #3

## 2018-06-09 MED FILL — VICTOZA 18 MG/3 ML INJECT P: 18 | 90 days supply | Qty: 27 | Fill #1

## 2018-06-10 DIAGNOSIS — C61 Malignant neoplasm of prostate: Secondary | ICD-10-CM | POA: Diagnosis not present

## 2018-06-10 DIAGNOSIS — N5231 Erectile dysfunction following radical prostatectomy: Secondary | ICD-10-CM | POA: Diagnosis not present

## 2018-06-10 DIAGNOSIS — N3941 Urge incontinence: Secondary | ICD-10-CM | POA: Diagnosis not present

## 2018-06-10 DIAGNOSIS — N393 Stress incontinence (female) (male): Secondary | ICD-10-CM | POA: Diagnosis not present

## 2018-06-12 MED FILL — LOSARTAN POTASSIUM 100 MG T: 100 | 90 days supply | Qty: 90 | Fill #3

## 2018-06-12 MED FILL — AMLODIPINE BESYLATE 10 MG T: 10 | 90 days supply | Qty: 90 | Fill #3

## 2018-06-12 MED FILL — metFORMIN HCL 1000 MG TABS: 1000 | 90 days supply | Qty: 180 | Fill #2

## 2018-06-12 MED FILL — INVOKANA 100 MG TABLET: 100 | 90 days supply | Qty: 90 | Fill #2

## 2018-06-13 MED FILL — glipiZIDE ER 2.5 MG TB24: 2.5 | 90 days supply | Qty: 180 | Fill #1

## 2018-07-07 MED FILL — ACCU-CHEK GUIDE STRP: 90 days supply | Qty: 100 | Fill #0

## 2018-07-20 MED FILL — UNIFINE PENTIPS 31GX3/16": 31G X 5 MM | 90 days supply | Qty: 100 | Fill #3

## 2018-07-20 MED FILL — UNIFINE PENTIPS 31GX3/16: 31G X 5 MM | 90 days supply | Qty: 100 | Fill #3

## 2018-07-23 ENCOUNTER — Telehealth: Payer: Self-pay | Admitting: Pulmonary Disease

## 2018-07-23 NOTE — Telephone Encounter (Signed)
Rec'd from [facility] forwarded 23 pages to Dr. Noralee Space

## 2018-07-31 MED FILL — HYDROCHLOROTHIAZIDE 25 MG T: 25 | 90 days supply | Qty: 90 | Fill #2

## 2018-08-03 ENCOUNTER — Telehealth: Payer: Self-pay | Admitting: Pulmonary Disease

## 2018-08-03 NOTE — Telephone Encounter (Signed)
Spoke with pt. He is needing a to have a physical done. I spoke with Dr. Lenna Gilford and he is going to call the pt himself. I have made the pt aware of this. I offered for [place] to help set him up with a new PCP if he needed [place] to.  Will route message to Dr. Lenna Gilford per his request.

## 2018-08-04 ENCOUNTER — Encounter: Payer: Self-pay | Admitting: Internal Medicine

## 2018-08-04 ENCOUNTER — Telehealth: Payer: Self-pay | Admitting: Internal Medicine

## 2018-08-04 ENCOUNTER — Ambulatory Visit: Payer: 59 | Admitting: Internal Medicine

## 2018-08-04 VITALS — BP 130/60 | HR 77 | Ht 71.0 in | Wt 304.0 lb

## 2018-08-04 DIAGNOSIS — E782 Mixed hyperlipidemia: Secondary | ICD-10-CM

## 2018-08-04 DIAGNOSIS — Z6841 Body Mass Index (BMI) 40.0 and over, adult: Secondary | ICD-10-CM | POA: Diagnosis not present

## 2018-08-04 DIAGNOSIS — E1165 Type 2 diabetes mellitus with hyperglycemia: Secondary | ICD-10-CM

## 2018-08-04 LAB — POCT GLYCOSYLATED HEMOGLOBIN (HGB A1C): Hemoglobin A1C: 6.5 % — AB (ref 4.0–5.6)

## 2018-08-04 NOTE — Telephone Encounter (Signed)
Dr. Alain Marion gave verbal ok to establish patient care to [facility].

## 2018-08-04 NOTE — Telephone Encounter (Signed)
Dr. Lenna Gilford aware. Will close this encounter.

## 2018-08-04 NOTE — Patient Instructions (Addendum)
Please continue off medicines.  Please look up:  - [facility] - Dr. Gerrit Halls - Fasting Mimicking Diet - Dr. Oren Binet  Please return in 4 months with your sugar log.

## 2018-08-04 NOTE — Progress Notes (Signed)
Patient ID: Rodney Hayes, male   DOB: 11-Dec-1950, 68 y.o.   MRN: [id number]   HPI: Rodney Hayes is a 68 y.o. male, returning for f/u for DM2, dx in 2011, non-insulin-dependent, uncontrolled, without long term complications. Last visit 4 months ago.  Earlier this month he started to do intermittent fasting.  He started with 12 hours a day, then 24 and now 48 hours.  He checks his sugars frequently during this.  And he had no lows.  However, sugars were trending down and he stopped all of his diabetic medicines approximately 1 week ago.  Sugars did not increase afterwards.  He feels great during the periods of fasting and even afterwards.  He lost 15 pounds since he started.  Last hemoglobin A1c was: Lab Results  Component Value Date   HGBA1C 7.2 (A) 04/02/2018   HGBA1C 7.2 10/23/2017   HGBA1C 7.4 07/24/2017   Pt is now off: - Victoza 1.8 mg daily in am - Invokana 100 mg daily in am - Metformin 200 mg with dinner - Glipizide ER 2.5 mg before b'fast (may need to increase to 5 mg). Previously on Kombiglyze 2.11-998 mg daily   Pt checks his sugars several times a day in the last 2 weeks: - am: 150-183 >> 134-164, 170, 174 >> 96-139 - 2h after b'fast: 104, 210 >> 197 >> n/c - before lunch: n/c  - 2h after lunch: n/c >> 172 >> n/c - before dinner: n/c >> 154 >> n/c >> 131, 139 - 2h after dinner: n/c >> 153 >> n/c - bedtime: n/c - nighttime: n/c Lowest sugar was 146 >> 134 >> 96; he has hypoglycemia awareness in the 70s. Highest sugar was 230 >> 174 >> 147.  Glucometer: AccuChek  Pt's meals are: - Breakfast:  Bacon, 2 bisquits, fruit - Lunch: hamburger, fries - Dinner - 6-8 pm: chicken, salad - Snacks: 4, nuts, candy, cottage cheese  He continues to workout at the gym approximately 3 times a week: Cardio, strength  - no CKD, last BUN/Cr: Lab Results  Component Value Date   BUN 18 01/14/2017   Lab Results  Component Value Date   CREATININE 0.91 01/14/2017  On losartan  100.  -+ HL; last set of lipids: Lab Results  Component Value Date   CHOL 166 08/16/2016   HDL 44.00 08/16/2016   LDLCALC 70 08/09/2015   LDLDIRECT 89.0 08/16/2016   TRIG 205.0 (H) 08/16/2016   CHOLHDL 4 08/16/2016  No statins due to joint pain.  - last eye exam was in 05/2017: No DR, + cataract  -Denies numbness and tingling in his feet.  He also has a history of HTN, OSA.  He  has a history of prostate cancer Gleason 7, + some extra-prostate extension; and had robotic radical prostatectomy in 01/2017.   ROS: Constitutional: + Weight loss, no fatigue, no subjective hyperthermia, no subjective hypothermia Eyes: no blurry vision, no xerophthalmia ENT: no sore throat, no nodules palpated in neck, no dysphagia, no odynophagia, no hoarseness Cardiovascular: no CP/no SOB/no palpitations/no leg swelling Respiratory: no cough/no SOB/no wheezing Gastrointestinal: no N/no V/no D/no C/no acid reflux Musculoskeletal: no muscle aches/no joint aches Skin: no rashes, no hair loss Neurological: no tremors/no numbness/no tingling/no dizziness  I reviewed pt's medications, allergies, PMH, social hx, family hx, and changes were documented in the history of present illness. Otherwise, unchanged from my initial visit note.  Past Medical History:  Diagnosis Date  . Arthritis   . Asthma  as a child  . BPH (benign prostatic hyperplasia)   . Diabetes mellitus    average fasting 140s type 2  . DJD (degenerative joint disease)   . Heart murmur    [name]  . Hepatitis    hep B  . Hyperlipidemia   . Hypertension   . Nonspecific abnormal electrocardiogram (ECG) (EKG)   . Obesity   . Prostate cancer ([place])   . Shortness of breath dyspnea    exertion  . Sleep apnea    C-PAP   Past Surgical History:  Procedure Laterality Date  . Biceps tendon reinsertion    . colonoscopy with polypectomy     adenomatous; [name]  . JOINT REPLACEMENT Left    knee  . left knee arthroscopy    .  LYMPHADENECTOMY Bilateral 01/23/2017   Procedure: BILATERAL LYMPHADENECTOMY;  Surgeon: [name], MD;  Location: [facility];  Service: Urology;  Laterality: Bilateral;  . PROSTATECTOMY     01/23/17 [name]  . ROBOT ASSISTED LAPAROSCOPIC RADICAL PROSTATECTOMY N/A 01/23/2017   Procedure: XI ROBOTIC ASSISTED LAPAROSCOPIC RADICAL PROSTATECTOMY LEVEL 3;  Surgeon: [name], MD;  Location: [facility];  Service: Urology;  Laterality: N/A;  . TONSILLECTOMY AND ADENOIDECTOMY    . TOTAL KNEE ARTHROPLASTY Left 01/11/2013   Procedure: LEFT TOTAL KNEE ARTHROPLASTY;  Surgeon: [name], MD;  Location: [facility];  Service: Orthopedics;  Laterality: Left;  . TOTAL SHOULDER ARTHROPLASTY Left 08/12/2014   Procedure: LEFT TOTAL SHOULDER ARTHROPLASTY;  Surgeon: [name], MD;  Location: [facility];  Service: Orthopedics;  Laterality: Left;  . UMBILICAL HERNIA REPAIR     Social History   Socioeconomic History  . Marital status: Married    Spouse name: Not on file  . Number of children: Not on file  . Years of education: Not on file  . Highest education level: Not on file  Occupational History  . Not on file  Social Needs  . Financial resource strain: Not on file  . Food insecurity:    Worry: Not on file    Inability: Not on file  . Transportation needs:    Medical: Not on file    Non-medical: Not on file  Tobacco Use  . Smoking status: Former Smoker    Packs/day: 2.00    Years: 30.00    Pack years: 60.00    Types: Cigarettes    Start date: 07/15/1962    Last attempt to quit: 08/15/1992    Years since quitting: 25.9  . Smokeless tobacco: Never Used  . Tobacco comment: smoked 14 -42 up to 2 ppd  Substance and Sexual Activity  . Alcohol use: Yes    Alcohol/week: 4.0 standard drinks    Types: 4 Glasses of wine per week    Comment: Red Wine  . Drug use: No  . Sexual activity: Yes  Lifestyle  . Physical activity:    Days per week: Not on file    Minutes per session: Not on file  . Stress:  Not on file  Relationships  . Social connections:    Talks on phone: Not on file    Gets together: Not on file    Attends religious service: Not on file    Active member of club or organization: Not on file    Attends meetings of clubs or organizations: Not on file    Relationship status: Not on file  . Intimate partner violence:    Fear of current or ex partner: Not  on file    Emotionally abused: Not on file    Physically abused: Not on file    Forced sexual activity: Not on file  Other Topics Concern  . Not on file  Social History Narrative  . Not on file   Current Outpatient Medications on File Prior to Visit  Medication Sig Dispense Refill  . amLODipine (NORVASC) 10 MG tablet TAKE 1 TABLET (10 MG TOTAL) BY MOUTH EVERY MORNING. 90 tablet 3  . doxazosin (CARDURA) 4 MG tablet Take 1 tablet (4 mg total) by mouth daily. 90 tablet 4  . glipiZIDE (GLUCOTROL XL) 2.5 MG 24 hr tablet Take 2 tablets (5 mg total) by mouth daily with breakfast. 180 tablet 3  . glucose blood (ACCU-CHEK GUIDE) test strip Take as directed test blood sugar once daily 100 each 12  . hydrochlorothiazide (HYDRODIURIL) 25 MG tablet TAKE 1 TABLET BY MOUTH DAILY. 90 tablet 3  . INVOKANA 100 MG TABS tablet TAKE 1 TABLET BY MOUTH DAILY BEFORE BREAKFAST. 90 tablet 3  . Lancets Misc. (ACCU-CHEK FASTCLIX LANCET) KIT Use as directed 1 kit 5  . losartan (COZAAR) 100 MG tablet TAKE 1 TABLET (100 MG TOTAL) BY MOUTH DAILY. 90 tablet 3  . metFORMIN (GLUCOPHAGE-XR) 500 MG 24 hr tablet Take 2,000 mg by mouth at bedtime.     . naproxen sodium (ALEVE) 220 MG tablet Take 220 mg by mouth.    . psyllium (METAMUCIL) 58.6 % packet Take 1 packet by mouth daily.    Marland Kitchen UNIFINE PENTIPS 31G X 5 MM MISC USE FOR VICTOZA DAILY AS DIRECTED 100 each PRN  . VICTOZA 18 MG/3ML SOPN INJECT 0.3 MLS (1.8 MG TOTAL) INTO THE SKIN DAILY. 72 mL 0   No current facility-administered medications on file prior to visit.    Allergies  Allergen Reactions  .  Cymbalta [Duloxetine Hcl]     As per e-mail 12/13/11: Insomnia, lethargy, inability to ejaculate  . Tetracycline     REACTION: PHOTOSENSITIVITY   Family History  Problem Relation Age of Onset  . Lung cancer Mother        smoker/lung ca and colon?  . Diabetes Father   . Coronary artery disease Father        CBAG in late 62s  . Cancer Father        prostate  . Stroke Neg Hx    PE: BP 130/60   Pulse 77   Ht '5\' 11"'  (1.803 m) Comment: measured  Wt (!) 304 lb (137.9 kg)   SpO2 97%   BMI 42.40 kg/m  Wt Readings from Last 3 Encounters:  08/04/18 (!) 304 lb (137.9 kg)  04/02/18 (!) 307 lb (139.3 kg)  11/05/17 (!) 313 lb 3.2 oz (142.1 kg)   Constitutional: overweight, in NAD Eyes: PERRLA, EOMI, no exophthalmos ENT: moist mucous membranes, no thyromegaly, no cervical lymphadenopathy Cardiovascular: RRR, No MRG Respiratory: CTA B Gastrointestinal: abdomen soft, NT, ND, BS+ Musculoskeletal: no deformities, strength intact in all 4 Skin: moist, warm, no rashes Neurological: no tremor with outstretched hands, DTR normal in all 4  ASSESSMENT: 1. DM2, non-insulin-dependent, uncontrolled, without long term complications, but with hyperglycemia  2.  Hyperlipidemia  3. Obesity  PLAN:  1. Patient with longstanding, uncontrolled, type 2 diabetes, on metformin, SGLT 2 inhibitor, and daily GLP-1 receptor agonist.  His sugars improved significantly after starting Invokana in the past, which she tolerates well.  He does have increased urination and he is also on HCTZ, but he takes the  diuretic after lunch and Invokana in the morning.  He had a setback of his diabetes control and his weight loss after his prostate cancer surgery when he was eating more and was not able to exercise.  He started to go to the gym before last visit and sugars started to improve but they were still above that were still above target.  At that time, he started to adjust his diet more, trying to stay away from sweets and  fast food.  At last visit we discussed about the option of adding long-term insulin, but we held off per his request and we added glipizide extended release in the morning instead.  We have to stay with a low dose since he does have a history of hypoglycemia on glipizide in the past. -HbA1c at last visit was stable, at 7.2%. -At this visit, he tells me that he started to do intermittent fasting, which is working really well for him.  He is hungry in the first hours of the fast and then the hunger stops and he feels very energetic.  Overall, he feels much better and sugars improved significantly so he had to stop his diabetes medicines.  Reviewing his sugar log, sugars are mostly at goal, without hyperglycemic spikes or low blood sugars.  I advised him that he can continue the current regimen, off medicines, paying close attention should do his sugars to avoid hypoglycemia. - I suggested to:  Patient Instructions  Please continue off medicines.  Please look up:  - [facility] - Dr. Gerrit Halls - Fasting Mimicking Diet - Dr. Oren Binet  Please return in 4 months with your sugar log.   - today, HbA1c is 6.5% (much better) - continue checking sugars at different times of the day - check 1x a day, rotating checks - advised for yearly eye exams >> he is UTD - Return to clinic in 4 mo with sugar log   2.  Hyperlipidemia - Reviewed latest lipid panel  From 2018: LDL at goal, triglycerides high Lab Results  Component Value Date   CHOL 166 08/16/2016   HDL 44.00 08/16/2016   LDLCALC 70 08/09/2015   LDLDIRECT 89.0 08/16/2016   TRIG 205.0 (H) 08/16/2016   CHOLHDL 4 08/16/2016  -He is not on a statin because of previous side effects: Joint aches -He has an appointment with Dr. Alain Marion, his new PCP next month.  3. Obesity -Continue intermittent fasting.  I also gave him more resources including about the fasting mimicking diet.   Philemon Kingdom, MD PhD [facility]

## 2018-08-25 ENCOUNTER — Telehealth: Payer: Self-pay | Admitting: Internal Medicine

## 2018-08-25 ENCOUNTER — Encounter: Payer: Self-pay | Admitting: Internal Medicine

## 2018-08-25 ENCOUNTER — Other Ambulatory Visit: Payer: Self-pay

## 2018-08-25 ENCOUNTER — Ambulatory Visit: Payer: 59 | Admitting: Internal Medicine

## 2018-08-25 ENCOUNTER — Other Ambulatory Visit (INDEPENDENT_AMBULATORY_CARE_PROVIDER_SITE_OTHER): Payer: 59

## 2018-08-25 VITALS — BP 132/72 | HR 56 | Temp 98.4°F | Ht 71.0 in | Wt 305.0 lb

## 2018-08-25 DIAGNOSIS — Z0000 Encounter for general adult medical examination without abnormal findings: Secondary | ICD-10-CM

## 2018-08-25 DIAGNOSIS — Z23 Encounter for immunization: Secondary | ICD-10-CM | POA: Diagnosis not present

## 2018-08-25 DIAGNOSIS — C61 Malignant neoplasm of prostate: Secondary | ICD-10-CM

## 2018-08-25 DIAGNOSIS — E1165 Type 2 diabetes mellitus with hyperglycemia: Secondary | ICD-10-CM

## 2018-08-25 DIAGNOSIS — I10 Essential (primary) hypertension: Secondary | ICD-10-CM

## 2018-08-25 DIAGNOSIS — L570 Actinic keratosis: Secondary | ICD-10-CM

## 2018-08-25 DIAGNOSIS — E785 Hyperlipidemia, unspecified: Secondary | ICD-10-CM

## 2018-08-25 DIAGNOSIS — I2510 Atherosclerotic heart disease of native coronary artery without angina pectoris: Secondary | ICD-10-CM

## 2018-08-25 LAB — BASIC METABOLIC PANEL
BUN: 17 mg/dL (ref 6–23)
CO2: 32 mEq/L (ref 19–32)
Calcium: 10.2 mg/dL (ref 8.4–10.5)
Chloride: 99 mEq/L (ref 96–112)
Creatinine, Ser: 0.91 mg/dL (ref 0.40–1.50)
GFR: 82.38 mL/min (ref >60.00)
Glucose, Bld: 118 mg/dL — ABNORMAL HIGH (ref 70–99)
Potassium: 3.8 mEq/L (ref 3.5–5.1)
Sodium: 139 mEq/L (ref 135–145)

## 2018-08-25 LAB — HEPATIC FUNCTION PANEL
ALT: 27 U/L (ref 0–53)
AST: 24 U/L (ref 0–37)
Albumin: 4.7 g/dL (ref 3.5–5.2)
Alkaline Phosphatase: 59 U/L (ref 39–117)
BILIRUBIN DIRECT: 0.2 mg/dL (ref 0.0–0.3)
Total Bilirubin: 0.6 mg/dL (ref 0.2–1.2)
Total Protein: 7.8 g/dL (ref 6.0–8.3)

## 2018-08-25 LAB — CBC WITH DIFFERENTIAL/PLATELET
BASOS PCT: 1.1 % (ref 0.0–3.0)
Basophils Absolute: 0.1 10*3/uL (ref 0.0–0.1)
Eosinophils Absolute: 0.1 10*3/uL (ref 0.0–0.7)
Eosinophils Relative: 2 % (ref 0.0–5.0)
HCT: 41.7 % (ref 39.0–52.0)
Hemoglobin: 14.4 g/dL (ref 13.0–17.0)
Lymphocytes Relative: 29 % (ref 12.0–46.0)
Lymphs Abs: 2 10*3/uL (ref 0.7–4.0)
MCHC: 34.5 g/dL (ref 30.0–36.0)
MCV: 92 fl (ref 78.0–100.0)
Monocytes Absolute: 0.6 10*3/uL (ref 0.1–1.0)
Monocytes Relative: 8.7 % (ref 3.0–12.0)
NEUTROS ABS: 4.1 10*3/uL (ref 1.4–7.7)
NEUTROS PCT: 59.2 % (ref 43.0–77.0)
PLATELETS: 253 10*3/uL (ref 150.0–400.0)
RBC: 4.53 Mil/uL (ref 4.22–5.81)
RDW: 13.1 % (ref 11.5–15.5)
WBC: 6.9 10*3/uL (ref 4.0–10.5)

## 2018-08-25 LAB — URINALYSIS
BILIRUBIN URINE: NEGATIVE
Hgb urine dipstick: NEGATIVE
Ketones, ur: NEGATIVE
LEUKOCYTE UA: NEGATIVE
Nitrite: NEGATIVE
Specific Gravity, Urine: <=1.005 — AB (ref 1.000–1.030)
Total Protein, Urine: NEGATIVE
Urine Glucose: NEGATIVE
Urobilinogen, UA: 0.2 (ref 0.0–1.0)
pH: 6 (ref 5.0–8.0)

## 2018-08-25 LAB — LIPID PANEL
Cholesterol: 207 mg/dL — ABNORMAL HIGH (ref 0–200)
HDL: 47.2 mg/dL (ref >39.00)
LDL Cholesterol: 128 mg/dL — ABNORMAL HIGH (ref 0–99)
NonHDL: 159.86
Total CHOL/HDL Ratio: 4
Triglycerides: 158 mg/dL — ABNORMAL HIGH (ref 0.0–149.0)
VLDL: 31.6 mg/dL (ref 0.0–40.0)

## 2018-08-25 LAB — TSH: TSH: 1.1 u[IU]/mL (ref 0.35–4.50)

## 2018-08-25 MED ORDER — AMLODIPINE BESYLATE 10 MG PO TABS: 5.0000 mg | ORAL_TABLET | Freq: Every morning | ORAL | 3 refills | Status: DC

## 2018-08-25 MED ORDER — VITAMIN D3 50 MCG (2000 UT) PO CAPS: 2000.0000 [IU] | ORAL_CAPSULE | Freq: Every day | ORAL | 3 refills | Status: AC

## 2018-08-25 MED ORDER — ASPIRIN EC 81 MG PO TBEC: 81.0000 mg | DELAYED_RELEASE_TABLET | Freq: Every day | ORAL | 3 refills | Status: AC

## 2018-08-25 MED ORDER — GLUCOSE BLOOD VI STRP: ORAL_STRIP | 12 refills | Status: DC

## 2018-08-25 MED ORDER — GLUCOSE BLOOD VI STRP
ORAL_STRIP | 12 refills | Status: DC
Start: 2018-08-25 — End: 2020-04-06

## 2018-08-25 MED ORDER — ICOSAPENT ETHYL 1 G PO CAPS: 2.0000 | ORAL_CAPSULE | Freq: Two times a day (BID) | ORAL | 11 refills | Status: DC

## 2018-08-25 MED FILL — ACCU-CHEK GUIDE STRP: 90 days supply | Qty: 400 | Fill #0

## 2018-08-25 NOTE — Progress Notes (Addendum)
Subjective:  Patient ID: Rodney Hayes, male    DOB: 1950-11-23  Age: 68 y.o. MRN: [id number]  CC: No chief complaint on file.   HPI Rodney Hayes presents for a well exam  DM, HTN, dyslipidemia f/u Fasting 48 h/wk - lost 17 lbs. Rodney Hayes stopped all his DM meds Lost wt from 319 lbs   Outpatient Medications Prior to Visit  Medication Sig Dispense Refill  . amLODipine (NORVASC) 10 MG tablet TAKE 1 TABLET (10 MG TOTAL) BY MOUTH EVERY MORNING. 90 tablet 3  . doxazosin (CARDURA) 4 MG tablet Take 1 tablet (4 mg total) by mouth daily. 90 tablet 4  . glucose blood (ACCU-CHEK GUIDE) test strip Use to check blood sugar 4 times a day 400 each 12  . hydrochlorothiazide (HYDRODIURIL) 25 MG tablet TAKE 1 TABLET BY MOUTH DAILY. 90 tablet 3  . Lancets Misc. (ACCU-CHEK FASTCLIX LANCET) KIT Use as directed 1 kit 5  . losartan (COZAAR) 100 MG tablet TAKE 1 TABLET (100 MG TOTAL) BY MOUTH DAILY. 90 tablet 3  . naproxen sodium (ALEVE) 220 MG tablet Take 220 mg by mouth.    . psyllium (METAMUCIL) 58.6 % packet Take 1 packet by mouth daily.    Marland Kitchen UNIFINE PENTIPS 31G X 5 MM MISC USE FOR VICTOZA DAILY AS DIRECTED 100 each PRN  . glipiZIDE (GLUCOTROL XL) 2.5 MG 24 hr tablet Take 2 tablets (5 mg total) by mouth daily with breakfast. (Patient not taking: Reported on 08/04/2018) 180 tablet 3  . INVOKANA 100 MG TABS tablet TAKE 1 TABLET BY MOUTH DAILY BEFORE BREAKFAST. (Patient not taking: Reported on 08/04/2018) 90 tablet 3  . metFORMIN (GLUCOPHAGE-XR) 500 MG 24 hr tablet Take 2,000 mg by mouth at bedtime.     Marland Kitchen VICTOZA 18 MG/3ML SOPN INJECT 0.3 MLS (1.8 MG TOTAL) INTO THE SKIN DAILY. (Patient not taking: Reported on 08/04/2018) 72 mL 0   No facility-administered medications prior to visit.     ROS: Review of Systems  Constitutional: Negative for appetite change, fatigue and unexpected weight change.  HENT: Negative for congestion, nosebleeds, sneezing, sore throat and trouble swallowing.   Eyes: Negative for  itching and visual disturbance.  Respiratory: Negative for cough.   Cardiovascular: Negative for chest pain, palpitations and leg swelling.  Gastrointestinal: Negative for abdominal distention, blood in stool, diarrhea and nausea.  Genitourinary: Negative for frequency and hematuria.  Musculoskeletal: Negative for back pain, gait problem, joint swelling and neck pain.  Skin: Negative for rash.  Neurological: Negative for dizziness, tremors, speech difficulty and weakness.  Psychiatric/Behavioral: Negative for agitation, dysphoric mood, sleep disturbance and suicidal ideas. The patient is not nervous/anxious.     Objective:  BP 132/72 (BP Location: Left Arm, Patient Position: Sitting, Cuff Size: Large)   Pulse (!) 56   Temp 98.4 F (36.9 C) (Oral)   Ht '5\' 11"'  (1.803 m)   Wt (!) 305 lb (138.3 kg)   SpO2 95%   BMI 42.54 kg/m   BP Readings from Last 3 Encounters:  08/25/18 132/72  08/04/18 130/60  04/02/18 138/68    Wt Readings from Last 3 Encounters:  08/25/18 (!) 305 lb (138.3 kg)  08/04/18 (!) 304 lb (137.9 kg)  04/02/18 (!) 307 lb (139.3 kg)    Physical Exam Constitutional:      General: He is not in acute distress.    Appearance: He is well-developed.     Comments: NAD  Eyes:     Conjunctiva/sclera: Conjunctivae normal.  Pupils: Pupils are equal, round, and reactive to light.  Neck:     Musculoskeletal: Normal range of motion.     Thyroid: No thyromegaly.     Vascular: No JVD.  Cardiovascular:     Rate and Rhythm: Normal rate and regular rhythm.     Heart sounds: Normal heart sounds. No murmur. No friction rub. No gallop.   Pulmonary:     Effort: Pulmonary effort is normal. No respiratory distress.     Breath sounds: Normal breath sounds. No wheezing or rales.  Chest:     Chest wall: No tenderness.  Abdominal:     General: Bowel sounds are normal. There is no distension.     Palpations: Abdomen is soft. There is no mass.     Tenderness: There is no  abdominal tenderness. There is no guarding or rebound.  Musculoskeletal: Normal range of motion.        General: No tenderness.  Lymphadenopathy:     Cervical: No cervical adenopathy.  Skin:    General: Skin is warm and dry.     Findings: No rash.  Neurological:     Mental Status: He is alert and oriented to person, place, and time.     Cranial Nerves: No cranial nerve deficit.     Motor: No abnormal muscle tone.     Coordination: Coordination normal.     Gait: Gait normal.     Deep Tendon Reflexes: Reflexes are normal and symmetric.  Psychiatric:        Behavior: Behavior normal.        Thought Content: Thought content normal.        Judgment: Judgment normal.   Obese Rectal - per Urology AKs Trace edema B  Lab Results  Component Value Date   WBC 5.9 01/14/2017   HGB 12.9 (L) 01/24/2017   HCT 38.0 (L) 01/24/2017   PLT 176 01/14/2017   GLUCOSE 158 (H) 01/14/2017   CHOL 166 08/16/2016   TRIG 205.0 (H) 08/16/2016   HDL 44.00 08/16/2016   LDLDIRECT 89.0 08/16/2016   LDLCALC 70 08/09/2015   ALT 34 08/09/2015   AST 28 08/09/2015   NA 139 01/14/2017   K 3.8 01/14/2017   CL 101 01/14/2017   CREATININE 0.91 01/14/2017   BUN 18 01/14/2017   CO2 28 01/14/2017   TSH 1.20 08/09/2015   PSA 4.51 (H) 08/16/2016   INR 0.91 01/06/2013   HGBA1C 6.5 (A) 08/04/2018   MICROALBUR 1.4 08/25/2014    No results found.  Assessment & Plan:   There are no diagnoses linked to this encounter.   No orders of the defined types were placed in this encounter.    Follow-up: No follow-ups on file.  Walker Kehr, MD

## 2018-08-25 NOTE — Assessment & Plan Note (Signed)
May reduce Norvasc to 5 mg/d

## 2018-08-25 NOTE — Assessment & Plan Note (Signed)
We discussed age appropriate health related issues, including available/recomended screening tests and vaccinations. We discussed a need for adhering to healthy diet and exercise. Labs were ordered to be later reviewed . All questions were answered. Colonoscopy is due in 2020 Dr Sharalyn Ink, [facility]

## 2018-08-25 NOTE — Patient Instructions (Addendum)
  Calcium Score  Presence of CAD  0 No evidence of CAD   1-10 Minimal evidence of CAD  11-100 Mild evidence of CAD  101-400 Moderate evidence of CAD  Over 400 Extensive evidence of CAD

## 2018-08-25 NOTE — Assessment & Plan Note (Signed)
prostatectomy 2018 Gleason 7 Dr Alinda Money

## 2018-08-25 NOTE — Assessment & Plan Note (Signed)
Statin intolerant ASA 81mg /d Vascepa suggested

## 2018-08-25 NOTE — Telephone Encounter (Signed)
MEDICATION: Accu-Chek Test Strips  PHARMACY:  [facility] [address] : unknown  IS PATIENT OUT OF MEDICATION: NO  IF NOT; HOW MUCH IS LEFT: 1 day  LAST APPOINTMENT DATE: @1 /21/2020  NEXT APPOINTMENT DATE:@6 /10/2018  DO WE HAVE YOUR PERMISSION TO LEAVE A DETAILED MESSAGE:  OTHER COMMENTS: Prescription for the test strips needs to be amended because patient has come off medications and is testing 4-5 times a day versus 1 time at day   **Let patient know to contact pharmacy at the end of the day to make sure medication is ready. **  ** Please notify patient to allow 48-72 hours to process**  **Encourage patient to contact the pharmacy for refills or they can request refills through [facility]**

## 2018-08-25 NOTE — Assessment & Plan Note (Signed)
Off meds now - wt loss

## 2018-08-25 NOTE — Assessment & Plan Note (Addendum)
2015 CT IMPRESSION: 1. Coronary calcium score of 1847. This was 69 percentile for age and sex matched control. LAD 515, RCA 1331, LCX 0  2. Normal origin of coronary arteries.  Right dominance.  3. Severe calcifications of LAD and RCA. Moderate calcified plaque of mid LAD with at least 50-70%, but possibly >70 % stenosis.  Start ASA, Vascepa

## 2018-08-25 NOTE — Assessment & Plan Note (Signed)
Rodney Hayes will see a dermatologist

## 2018-08-25 NOTE — Telephone Encounter (Signed)
RX sent

## 2018-08-28 ENCOUNTER — Other Ambulatory Visit: Payer: Self-pay | Admitting: Pulmonary Disease

## 2018-08-28 DIAGNOSIS — I10 Essential (primary) hypertension: Secondary | ICD-10-CM

## 2018-08-28 MED FILL — VASCEPA 1 GM CAPSULE: 1 | 30 days supply | Qty: 120 | Fill #0 | Status: TO

## 2018-09-03 ENCOUNTER — Other Ambulatory Visit: Payer: Self-pay | Admitting: Internal Medicine

## 2018-09-03 NOTE — Telephone Encounter (Signed)
Requested medication (s) are due for refill today: yes  Requested medication (s) are on the active medication list: yes  Last refill:  ?  Future visit scheduled: No  Notes to clinic:  Original prescription written per Dr Lenna Gilford    Requested Prescriptions  Pending Prescriptions Disp Refills   doxazosin (CARDURA) 4 MG tablet 90 tablet 4    Sig: Take 1 tablet (4 mg total) by mouth daily.     Cardiovascular:  Alpha Blockers Passed - 09/03/2018  2:47 PM      Passed - Last BP in normal range    BP Readings from Last 1 Encounters:  08/25/18 132/72         Passed - Valid encounter within last 6 months    Recent Outpatient Visits          1 week ago Well adult exam   [facility], Evie Lacks, MD   3 years ago Uncontrolled diabetes mellitus   [facility] -[facility] Danas, Darrick Penna, MD   4 years ago Essential hypertension   [facility] -Tressia Danas, Darrick Penna, MD

## 2018-09-03 NOTE — Telephone Encounter (Signed)
Copied from [place] [id number]. Topic: Quick Communication - Rx Refill/Question >> Sep 03, 2018  2:41 PM Jeri Cos wrote: Medication: doxazosin (CARDURA) 4 MG tablet     Has the patient contacted their pharmacy? Yes.    (Agent: If yes, when and what did the pharmacy advise?) Pharmacy told pt that they have not received any info from the practice regarding the script  Preferred Pharmacy (with phone number or street name): [place], [place] - [address]. [phone] (Phone) [phone] (Fax)    Agent: Please be advised that RX refills may take up to 3 business days. We ask that you follow-up with your pharmacy.

## 2018-09-05 MED ORDER — DOXAZOSIN MESYLATE 4 MG PO TABS: 4.0000 mg | ORAL_TABLET | Freq: Every day | ORAL | 3 refills | Status: DC

## 2018-09-05 MED FILL — DOXAZOSIN MESYLATE 4 MG TAB: 4 | 90 days supply | Qty: 90 | Fill #0

## 2018-09-10 ENCOUNTER — Other Ambulatory Visit: Payer: Self-pay | Admitting: Pulmonary Disease

## 2018-09-11 ENCOUNTER — Other Ambulatory Visit: Payer: Self-pay

## 2018-09-11 MED ORDER — AMLODIPINE BESYLATE 10 MG PO TABS: 5.0000 mg | ORAL_TABLET | Freq: Every morning | ORAL | 3 refills | Status: DC

## 2018-09-11 MED ORDER — LOSARTAN POTASSIUM 100 MG PO TABS: 100.0000 mg | ORAL_TABLET | Freq: Every day | ORAL | 3 refills | Status: DC

## 2018-09-11 MED FILL — AMLODIPINE BESYLATE 10 MG T: 10 | 90 days supply | Qty: 45 | Fill #0 | Status: TO

## 2018-09-11 MED FILL — LOSARTAN POTASSIUM 100 MG T: 100 | 90 days supply | Qty: 90 | Fill #0

## 2018-09-15 DIAGNOSIS — H25013 Cortical age-related cataract, bilateral: Secondary | ICD-10-CM | POA: Diagnosis not present

## 2018-09-15 DIAGNOSIS — H2513 Age-related nuclear cataract, bilateral: Secondary | ICD-10-CM | POA: Diagnosis not present

## 2018-09-15 DIAGNOSIS — H43391 Other vitreous opacities, right eye: Secondary | ICD-10-CM | POA: Diagnosis not present

## 2018-09-15 DIAGNOSIS — H35033 Hypertensive retinopathy, bilateral: Secondary | ICD-10-CM | POA: Diagnosis not present

## 2018-09-15 LAB — HM DIABETES EYE EXAM

## 2018-11-04 MED FILL — VASCEPA 1 GM CAPSULE: 1 | 30 days supply | Qty: 120 | Fill #0

## 2018-11-04 MED FILL — HYDROCHLOROTHIAZIDE 25 MG T: 25 | 90 days supply | Qty: 90 | Fill #0

## 2018-11-05 MED FILL — AMLODIPINE BESYLATE 10 MG T: 10 | 90 days supply | Qty: 45 | Fill #0

## 2018-11-23 ENCOUNTER — Telehealth: Payer: Self-pay | Admitting: Pulmonary Disease

## 2018-11-23 DIAGNOSIS — G4733 Obstructive sleep apnea (adult) (pediatric): Secondary | ICD-10-CM

## 2018-11-23 NOTE — Telephone Encounter (Signed)
Patient called and wanting to switch from [facility] to [facility] [place] that I will place these new cpap supply orders with new company Pt is needing new mask and supplies; rec'd staff message from [facility] this is okay Placed order for these supplies today Nothing further needed

## 2018-11-25 ENCOUNTER — Telehealth: Payer: Self-pay | Admitting: Pulmonary Disease

## 2018-11-25 DIAGNOSIS — G4733 Obstructive sleep apnea (adult) (pediatric): Secondary | ICD-10-CM

## 2018-11-25 NOTE — Telephone Encounter (Signed)
Called [facility] spoke with [facility]. Order for cpap supplies needs Dr. Juanetta Gosling signatue. Orders can't be processed without physician signature and npi. They are still awaiting sleep study.

## 2018-11-25 NOTE — Telephone Encounter (Signed)
[facility] sent in a fax requesting the sleep study and Rx for Cpap supplies. I don't see the sleep study in Epic and I have ask for the chart from the warehouse to see if we have a copy. Dr. Melvern Banker did it in 2005 and Dr. Gwenette Greet read the study. Can someone put in the Rx for Cpap supplies

## 2018-11-27 NOTE — Telephone Encounter (Signed)
The order for CPAP Supplies was signed by Dr. Halford Chessman on 5/12 before the order was sent to [facility].

## 2018-12-02 ENCOUNTER — Telehealth: Payer: Self-pay | Admitting: Pulmonary Disease

## 2018-12-02 NOTE — Telephone Encounter (Signed)
LM on Patience's VM.  [facility] has Epic Access.    Type Date User Summary Attachment  General 11/27/2018 9:19 AM Vella Kohler D - -  Note   PER ANITA'S PHONE MESSAGE:   [facility] sent in a fax requesting the sleep study and Rx for Cpap supplies. I don't see the sleep study in Epic and I have ask for the chart from the warehouse to see if we have a copy. Dr. Melvern Banker did it in 2005 and Dr. Gwenette Greet read the study. Can someone put in the Rx for Cpap supplies         See phone note dated 5/13 initiated by [place].

## 2018-12-02 NOTE — Telephone Encounter (Signed)
I ordered the chart and when it came we didn't have a copy. I had to ask [facility] for the copy of sleep study and it has been faxed

## 2018-12-14 MED FILL — LOSARTAN POTASSIUM 100 MG T: 100 | 30 days supply | Qty: 30 | Fill #1

## 2018-12-14 MED FILL — DOXAZOSIN MESYLATE 4 MG TAB: 4 | 90 days supply | Qty: 90 | Fill #1

## 2018-12-15 ENCOUNTER — Other Ambulatory Visit: Payer: Self-pay

## 2018-12-17 ENCOUNTER — Ambulatory Visit: Payer: 59 | Admitting: Internal Medicine

## 2018-12-21 ENCOUNTER — Telehealth: Payer: Self-pay | Admitting: Adult Health

## 2018-12-21 DIAGNOSIS — Z9189 Other specified personal risk factors, not elsewhere classified: Secondary | ICD-10-CM

## 2018-12-21 NOTE — Telephone Encounter (Signed)
Order placed for COVID 19 antibody testing and msg sent to [facility] pre-procedural pool, thank you.

## 2018-12-21 NOTE — Telephone Encounter (Signed)
[facility] community testing pool does not schedule pre procedural testing. Will route request to [place] triage pool

## 2018-12-21 NOTE — Telephone Encounter (Signed)
Patient of Dr. Halford Chessman  -[facility]  Has upcoming oral surgery ov , needs COVID preprocedural testing and also COVID 19 antibody testing .   Please place order through [facility]/ preprocedural process for COVID 19 -  Please place antibiody test for our labs   Thanks

## 2018-12-22 ENCOUNTER — Other Ambulatory Visit: Payer: Self-pay | Admitting: Adult Health

## 2018-12-22 NOTE — Telephone Encounter (Signed)
BettieJo can you help with this? thanks

## 2018-12-22 NOTE — Telephone Encounter (Signed)
Called and spoke with [name].  Scheduled his pre-procedure COVID testing.  Also ordered the [facility] lab and gave instructions on coming to office for lab as well as going to [facility] education center for COVID swab.  Nothing further needed at this time.

## 2018-12-23 ENCOUNTER — Other Ambulatory Visit (HOSPITAL_COMMUNITY): Admission: RE | Admit: 2018-12-23 | Payer: 59 | Source: Ambulatory Visit

## 2018-12-23 ENCOUNTER — Other Ambulatory Visit: Payer: 59

## 2018-12-23 DIAGNOSIS — Z9189 Other specified personal risk factors, not elsewhere classified: Secondary | ICD-10-CM | POA: Diagnosis not present

## 2018-12-23 DIAGNOSIS — N393 Stress incontinence (female) (male): Secondary | ICD-10-CM | POA: Diagnosis not present

## 2018-12-23 DIAGNOSIS — N5231 Erectile dysfunction following radical prostatectomy: Secondary | ICD-10-CM | POA: Diagnosis not present

## 2018-12-23 DIAGNOSIS — C61 Malignant neoplasm of prostate: Secondary | ICD-10-CM | POA: Diagnosis not present

## 2018-12-23 LAB — SAR COV2 SEROLOGY (COVID19)AB(IGG),IA: SARS CoV2 AB IGG: NEGATIVE

## 2018-12-23 NOTE — Addendum Note (Signed)
Addended by: Parke Poisson E on: 12/23/2018 02:56 PM   Modules accepted: Orders

## 2018-12-26 IMAGING — CR DG CHEST 2V
2 series · 2 of 2 positions shown · non-contrast
Comparison: CT chest of 11/20/2015 and chest x-ray of 01/06/2013

CLINICAL DATA: Robotic assisted laparoscopic radical prostatectomy,
preop

EXAM:
CHEST  2 VIEW

[w chest pa]
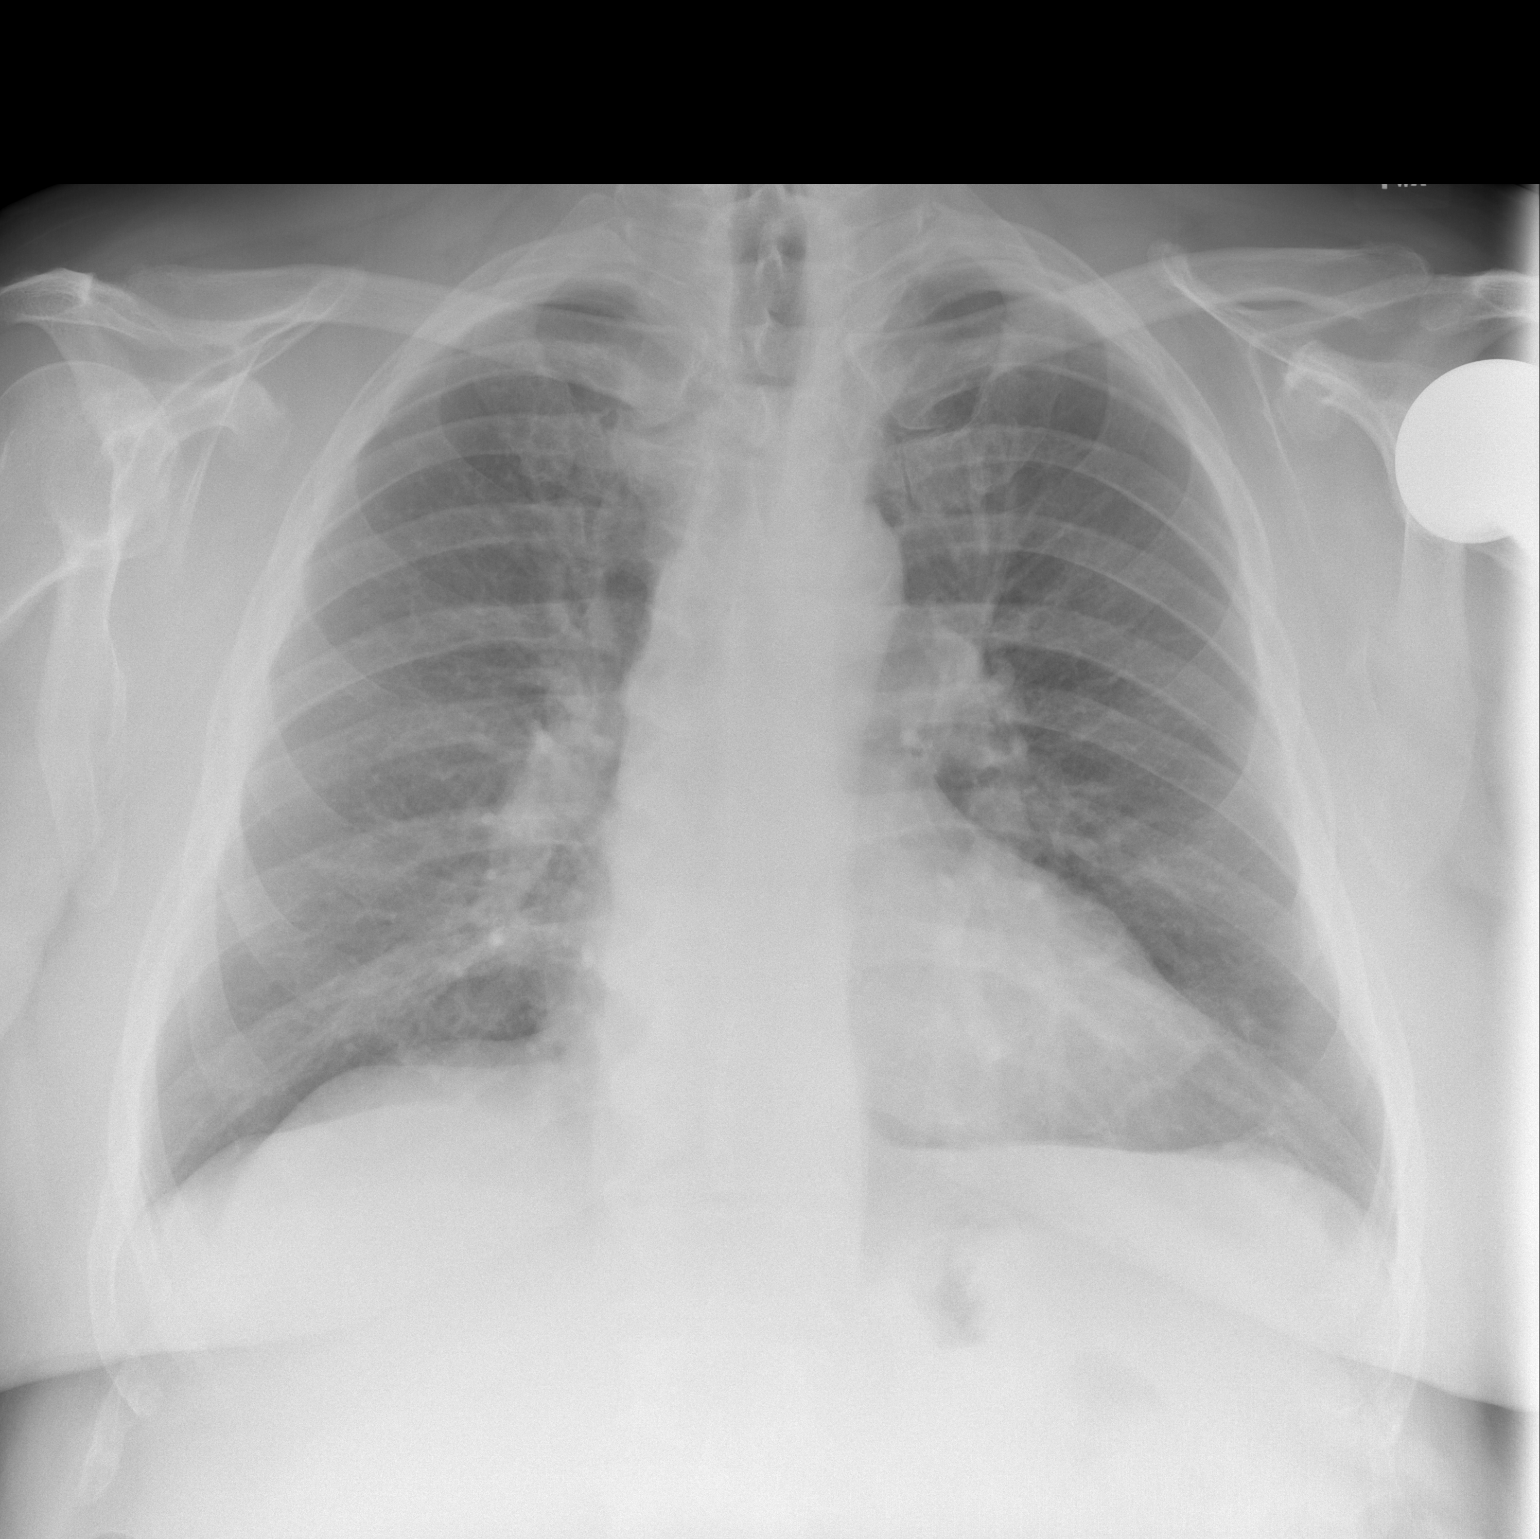

[w chest lat]
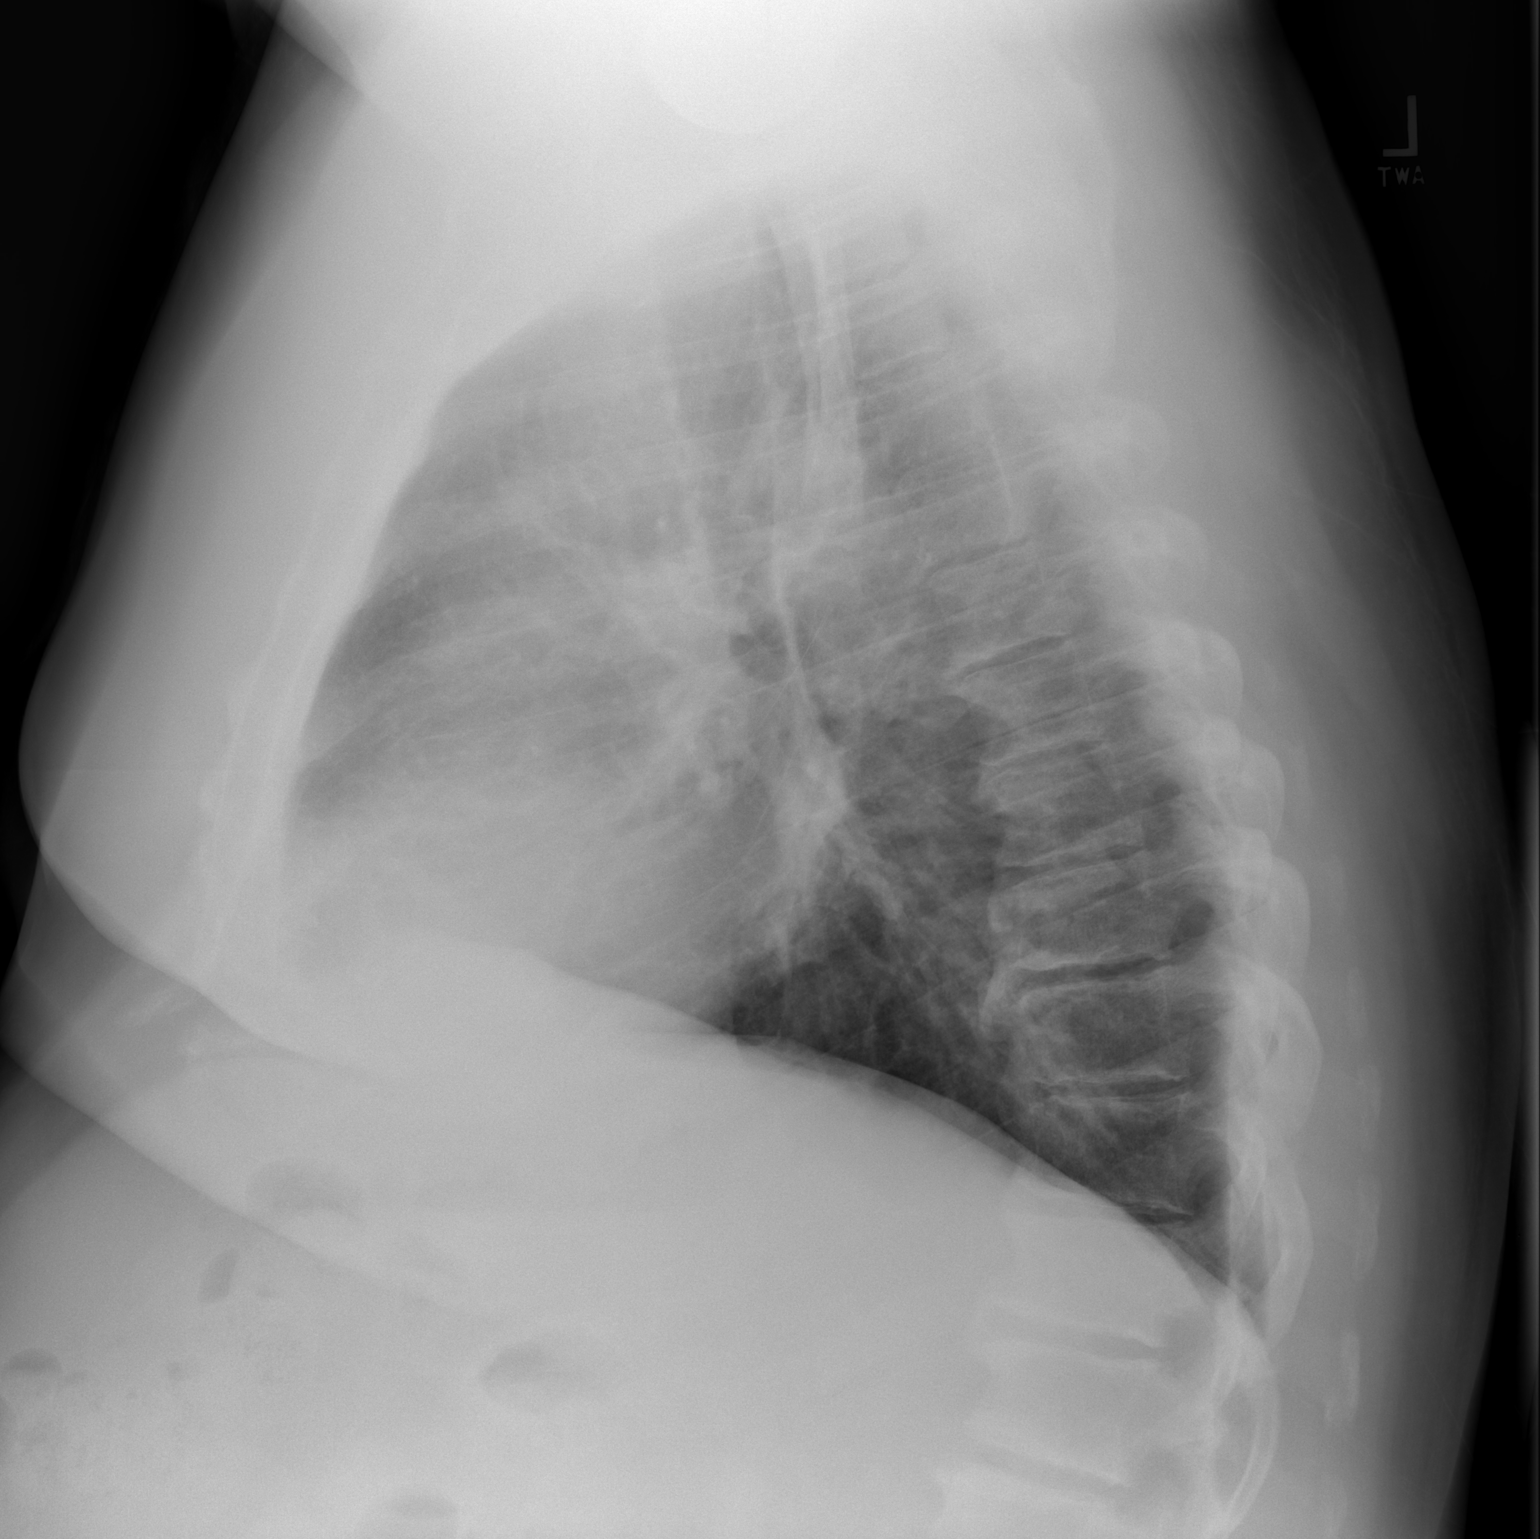

[2 of 2 positions shown; findings below may reference images not displayed]

FINDINGS: No active infiltrate or effusion is seen. Mediastinal and hilar
contours are unremarkable. The heart is within upper limits normal.
There are degenerative changes throughout the mid to lower thoracic
spine. A prosthetic left humeral head is noted.
IMPRESSION: No active cardiopulmonary disease.

## 2018-12-28 ENCOUNTER — Telehealth: Payer: Self-pay | Admitting: *Deleted

## 2018-12-28 ENCOUNTER — Other Ambulatory Visit: Payer: 59

## 2018-12-28 DIAGNOSIS — Z20822 Contact with and (suspected) exposure to covid-19: Secondary | ICD-10-CM

## 2018-12-28 DIAGNOSIS — R6889 Other general symptoms and signs: Secondary | ICD-10-CM | POA: Diagnosis not present

## 2018-12-28 MED FILL — VASCEPA 1 GM CAPSULE: 1 | 30 days supply | Qty: 120 | Fill #0

## 2018-12-28 NOTE — Telephone Encounter (Signed)
Pt called stating that he has cared for COVID testing; he did have negative antibodies on 12/23/2018; pt offered and accepted at [facility] site 12/28/2018 at 1445; pt given address, location, and instructions that he and all occupants of his vehicle should wear masks; he verbalized understanding; orders placed per protocol.

## 2018-12-30 ENCOUNTER — Encounter: Payer: Self-pay | Admitting: Pulmonary Disease

## 2018-12-30 DIAGNOSIS — G4733 Obstructive sleep apnea (adult) (pediatric): Secondary | ICD-10-CM

## 2018-12-30 LAB — NOVEL CORONAVIRUS, NAA: SARS-CoV-2, NAA: NOT DETECTED

## 2018-12-30 NOTE — Progress Notes (Signed)
[facility]  Chief Complaint  Patient presents with  . Sleep Apnea    Constitutional:  BP (!) 147/78   Pulse 64   Temp 98.4 F (36.9 C)   Resp 16   Ht '5\' 11"'  (1.803 m)   Wt 290 lb (131.5 kg)   SpO2 98%   BMI 40.45 kg/m   Past Medical History:  HTN, HLD, Hep B, DJD, DM, Prostate cancer, BPH, OA  Brief Summary:  Rodney Hayes is a 68 y.o. male with obstructive sleep apnea.  Previously seen by Dr. Lenna Gilford.  Had sleep study in 2005.  Showed severe obstructive sleep apnea.  Uses CPAP nightly.  No issues with mask fit.  Denies aerophagia, sinus congestion, or sore throat.  Needs new supplies.  Feels he can't sleep w/o CPAP.   Physical Exam:   Appearance - well kempt   ENMT - clear nasal mucosa, midline nasal  septum, no oral exudates, no LAN, trachea midline  Respiratory - normal chest wall, normal respiratory effort, no accessory muscle use, no wheeze/rales  CV - s1s2 regular rate and rhythm, no murmurs, no peripheral edema, radial pulses symmetric  GI - soft, non tender, no masses  Lymph - no adenopathy noted in neck and axillary areas  MSK - normal gait  Ext - no cyanosis, clubbing, or joint inflammation noted  Skin - no rashes, lesions, or ulcers  Neuro - normal strength, oriented x 3  Psych - normal mood and affect   Assessment/Plan:   Obstructive sleep apnea. - he is compliant with therapy and reports benefit from CPAP - continue CPAP 14 cm H2O - will arrange for new supplies through [place], MD [place] Pager: [phone] 12/30/2018, 1:19 PM  Flow Sheet     Pulmonary tests:  CT chest 11/20/15 >> no change RLL 6 mm nodule, diffuse esophageal walk thickening, coronary calcification   Sleep tests:  PSG 12/21/03 >> AHI 43, SpO2 low 88% CPAP 10/05/17 to 11/03/17 >> used on 28 of 30 nights with average 8 hrs 36 min.  Average AHI 0.4 with CPAP 14 cm H2O   Cardiac tests:  Echo 08/17/15 >> EF 55 to 60%, mild AS, PAS 32 mmHg  Medications:   Allergies as of 12/30/2018      Reactions   Cymbalta [duloxetine Hcl]    As per e-mail 12/13/11: Insomnia, lethargy, inability to ejaculate   Tetracycline    REACTION: PHOTOSENSITIVITY      Medication List       Accurate as of December 30, 2018  1:19 PM. If you have any questions, ask your nurse or doctor.        Accu-Chek [employer] Kit Use as directed   amLODipine 10 MG tablet Commonly known as: NORVASC Take 0.5 tablets (5 mg total) by mouth every morning.   aspirin EC 81 MG tablet Take 1 tablet (81 mg total) by mouth daily.   doxazosin 4 MG tablet Commonly known as: CARDURA Take 1 tablet (4 mg total) by mouth daily.   glucose blood test strip Commonly known as: Accu-Chek Guide Use to check blood sugar 4 times a day   hydrochlorothiazide 25 MG tablet Commonly known as: HYDRODIURIL TAKE 1 TABLET BY MOUTH DAILY.   Icosapent Ethyl 1 g Caps Commonly known as: Vascepa Take 2 capsules (2 g total) by mouth 2 (two) times daily.   losartan 100 MG tablet Commonly known as: COZAAR Take  1 tablet (100 mg total) by mouth daily.   naproxen sodium 220 MG tablet Commonly known as: ALEVE Take 220 mg by mouth.   psyllium 58.6 % packet Commonly known as: METAMUCIL Take 1 packet by mouth daily.   Unifine Pentips 31G X 5 MM Misc Generic drug: Insulin Pen Needle USE FOR VICTOZA DAILY AS DIRECTED   Vitamin D3 50 MCG (2000 UT) capsule Take 1 capsule (2,000 Units total) by mouth daily.       Past Surgical History:  He  has a past surgical history that includes Tonsillectomy and adenoidectomy; Umbilical hernia repair; left knee arthroscopy; Biceps tendon reinsertion; colonoscopy with polypectomy; Total knee arthroplasty (Left, 01/11/2013); Joint replacement (Left); Total shoulder arthroplasty (Left, 08/12/2014); Prostatectomy; Robot assisted laparoscopic radical prostatectomy (N/A, 01/23/2017);  and Lymphadenectomy (Bilateral, 01/23/2017).  Family History:  His family history includes Alcohol abuse in his mother; Cancer in his father; Coronary artery disease in his father; Diabetes in his father and mother; Heart attack in his father; Lung cancer in his mother.  Social History:  He  reports that he quit smoking about 26 years ago. His smoking use included cigarettes. He started smoking about 56 years ago. He has a 60.00 pack-year smoking history. He has never used smokeless tobacco. He reports current alcohol use of about 4.0 standard drinks of alcohol per week. He reports that he does not use drugs.

## 2018-12-30 NOTE — Patient Instructions (Signed)
Will arrange for new CPAP supplies  Follow up in 3 to 4 months

## 2019-01-05 MED FILL — CHLORHEXIDINE 0.12% RINSE: 0.12 | 17 days supply | Qty: 473 | Fill #0

## 2019-01-08 ENCOUNTER — Telehealth: Payer: Self-pay | Admitting: Pulmonary Disease

## 2019-01-08 DIAGNOSIS — G4733 Obstructive sleep apnea (adult) (pediatric): Secondary | ICD-10-CM

## 2019-01-08 MED FILL — PENICILLIN VK 500 MG TABLET: 500 | 7 days supply | Qty: 28 | Fill #0

## 2019-01-08 NOTE — Telephone Encounter (Signed)
Order placed for CPAP supplies placed to [facility]  Patient is aware Nothing further needed; will sign off

## 2019-01-12 DIAGNOSIS — G4733 Obstructive sleep apnea (adult) (pediatric): Secondary | ICD-10-CM | POA: Diagnosis not present

## 2019-01-12 MED FILL — LOSARTAN POTASSIUM 100 MG T: 100 | 30 days supply | Qty: 30 | Fill #2

## 2019-01-25 MED FILL — CHLORHEXIDINE 0.12% RINSE: 0.12 | 17 days supply | Qty: 473 | Fill #0

## 2019-01-25 MED FILL — HYDROCODON-APAP 5-325: 5-325 | 2 days supply | Qty: 7 | Fill #0

## 2019-01-25 MED FILL — DEXAMETHASONE 4 MG TABLET: 4 | 3 days supply | Qty: 6 | Fill #0

## 2019-01-25 MED FILL — AMOXICILLIN 500 MG CAPSULE: 500 | 7 days supply | Qty: 21 | Fill #0

## 2019-01-25 MED FILL — IBUPROFEN 400 MG TABS: 400 | 5 days supply | Qty: 30 | Fill #0

## 2019-02-15 ENCOUNTER — Other Ambulatory Visit: Payer: Self-pay | Admitting: Pulmonary Disease

## 2019-02-15 MED FILL — LOSARTAN POTASSIUM 100 MG T: 100 | 30 days supply | Qty: 30 | Fill #3

## 2019-02-15 MED FILL — HYDROCHLOROTHIAZIDE 25 MG T: 25 | 90 days supply | Qty: 90 | Fill #0

## 2019-02-15 MED FILL — AMLODIPINE BESYLATE 10 MG T: 10 | 90 days supply | Qty: 45 | Fill #0

## 2019-03-08 MED FILL — DOXAZOSIN MESYLATE 4 MG TAB: 4 | 90 days supply | Qty: 90 | Fill #2

## 2019-03-08 MED FILL — VASCEPA 1 GM CAPSULE: 1 | 30 days supply | Qty: 120 | Fill #1

## 2019-03-11 MED FILL — LOSARTAN POTASSIUM 100 MG T: 100 | 30 days supply | Qty: 30 | Fill #4

## 2019-03-24 DIAGNOSIS — C61 Malignant neoplasm of prostate: Secondary | ICD-10-CM | POA: Diagnosis not present

## 2019-04-02 ENCOUNTER — Ambulatory Visit: Payer: 59 | Admitting: Internal Medicine

## 2019-04-06 ENCOUNTER — Other Ambulatory Visit: Payer: Self-pay

## 2019-04-06 ENCOUNTER — Ambulatory Visit: Payer: 59 | Admitting: Radiation Oncology

## 2019-04-06 ENCOUNTER — Ambulatory Visit
Admission: RE | Admit: 2019-04-06 | Discharge: 2019-04-06 | Disposition: A | Discharge status: Home / Self Care | Payer: 59 | Source: Ambulatory Visit | Attending: Radiation Oncology | Admitting: Radiation Oncology

## 2019-04-06 ENCOUNTER — Encounter: Payer: Self-pay | Admitting: *Deleted

## 2019-04-06 ENCOUNTER — Encounter: Payer: Self-pay | Admitting: Urology

## 2019-04-06 ENCOUNTER — Ambulatory Visit
Admission: RE | Admit: 2019-04-06 | Discharge: 2019-04-06 | Disposition: A | Discharge status: Home / Self Care | Payer: 59 | Source: Ambulatory Visit | Attending: Urology | Admitting: Urology

## 2019-04-06 DIAGNOSIS — R9721 Rising PSA following treatment for malignant neoplasm of prostate: Secondary | ICD-10-CM | POA: Diagnosis not present

## 2019-04-06 DIAGNOSIS — Z8546 Personal history of malignant neoplasm of prostate: Secondary | ICD-10-CM | POA: Diagnosis not present

## 2019-04-06 DIAGNOSIS — C61 Malignant neoplasm of prostate: Secondary | ICD-10-CM | POA: Insufficient documentation

## 2019-04-06 DIAGNOSIS — Z9079 Acquired absence of other genital organ(s): Secondary | ICD-10-CM | POA: Diagnosis not present

## 2019-04-06 DIAGNOSIS — Z8042 Family history of malignant neoplasm of prostate: Secondary | ICD-10-CM | POA: Diagnosis not present

## 2019-04-06 NOTE — Patient Instructions (Signed)
Coronavirus (COVID-19) Are you at risk?  Are you at risk for the Coronavirus (COVID-19)?  To be considered HIGH RISK for Coronavirus (COVID-19), you have to meet the following criteria:  . Traveled to [place], [place], [place], [place] or [place]; or in the [place] to [place], [place], [place], or [place]; and have fever, cough, and shortness of breath within the last 2 weeks of travel OR . Been in close contact with a person diagnosed with COVID-19 within the last 2 weeks and have fever, cough, and shortness of breath . IF YOU DO NOT MEET THESE CRITERIA, YOU ARE CONSIDERED LOW RISK FOR COVID-19.  What to do if you are HIGH RISK for COVID-19?  . If you are having a medical emergency, call 911. . Seek medical care right away. Before you go to a doctor's office, urgent care or emergency department, call ahead and tell them about your recent travel, contact with someone diagnosed with COVID-19, and your symptoms. You should receive instructions from your physician's office regarding next steps of care.  . When you arrive at healthcare provider, tell the healthcare staff immediately you have returned from visiting [place], [place], [place], [place] or [place]; or traveled in the [place] to [place], [place], [place], or [place]; in the last two weeks or you have been in close contact with a person diagnosed with COVID-19 in the last 2 weeks.   . Tell the health care staff about your symptoms: fever, cough and shortness of breath. . After you have been seen by a medical provider, you will be either: o Tested for (COVID-19) and discharged home on quarantine except to seek medical care if symptoms worsen, and asked to  - Stay home and avoid contact with others until you get your results (4-5 days)  - Avoid travel on public transportation if possible (such as bus, train, or airplane) or o Sent to the Emergency Department by EMS for evaluation, COVID-19 testing, and possible  admission depending on your condition and test results.  What to do if you are LOW RISK for COVID-19?  Reduce your risk of any infection by using the same precautions used for avoiding the common cold or flu:  . Wash your hands often with soap and warm water for at least 20 seconds.  If soap and water are not readily available, use an alcohol-based hand sanitizer with at least 60% alcohol.  . If coughing or sneezing, cover your mouth and nose by coughing or sneezing into the elbow areas of your shirt or coat, into a tissue or into your sleeve (not your hands). . Avoid shaking hands with others and consider head nods or verbal greetings only. . Avoid touching your eyes, nose, or mouth with unwashed hands.  . Avoid close contact with people who are sick. . Avoid places or events with large numbers of people in one location, like concerts or sporting events. . Carefully consider travel plans you have or are making. . If you are planning any travel outside or inside the [place], visit the CDC's Travelers' Health webpage for the latest health notices. . If you have some symptoms but not all symptoms, continue to monitor at home and seek medical attention if your symptoms worsen. . If you are having a medical emergency, call 911.   ADDITIONAL HEALTHCARE OPTIONS FOR PATIENTS  [facility] / e-Visit: [email or web address]         [facility]: [phone]  [facility]: [phone]                   [facility]: [phone]   

## 2019-04-06 NOTE — Progress Notes (Signed)
Patient in for consult. He has a vacation planned for the last two weeks of October so wants to hold on radiation.

## 2019-04-06 NOTE — Progress Notes (Signed)
Radiation Oncology         (336) [phone] ________________________________  Outpatient Follow up New visit  Name: Rodney Hayes MRN: [id number]  Date: 04/06/2019  DOB: 05-14-1951  HX:[id number], Evie Lacks, MD  [name], MD   REFERRING PHYSICIAN: [name], MD  DIAGNOSIS: 69 year-old gentleman with rising, detectable PSA of 0.21 s/p RALP in 01/2017 for pT3aN0, Gleason 4+3,  adenocarcinoma of the prostate    ICD-10-CM   1. Biochemically recurrent malignant neoplasm of prostate  R97.21      HISTORY OF PRESENT ILLNESS: Rodney Hayes is a 68 y.o. male with a diagnosis of prostate cancer who was treated surgically in July 2018. At the time of his diagnosis, his PSA was 5.14, and Gleason score was 4+3. He underwent prostatectomy on 01/23/2017 with [name] and final pathology revealed a 4+3 cancer with a tertiary pattern of 5. He had extracapsular extension, perineural invasion, and all of his pelvic nodes were negative. His postop PSA on 04/24/17 was 0.09. His PSA was rechecked on 06/11/2017 and was 0.101. He was evaluated in our office on 07/14/2017 but ultimately opted to proceed with PSA surveillance.   Since that time, his PSA stabilized through 05/2018, where it stayed between 0.08 and 0.11 and increased to 0.17 in 12/18/2018. At that time, he opted to wait until fall to pursue treatment. His most recent PSA from 03/24/19 is 0.21.  He returns today to review the options for salvage therapy to the prostatic fossa.  PREVIOUS RADIATION THERAPY: No  PAST MEDICAL HISTORY:  Past Medical History:  Diagnosis Date   Arthritis    Asthma    as a child   BPH (benign prostatic hyperplasia)    Cancer ([place])    prostatectomy 2018 Gleason 7   Diabetes mellitus    average fasting 140s type 2   DJD (degenerative joint disease)    Heart murmur    [name]   Hepatitis    hep B   Hyperlipidemia    Hypertension    Obesity    Sleep apnea    C-PAP      PAST SURGICAL  HISTORY: Past Surgical History:  Procedure Laterality Date   Biceps tendon reinsertion     colonoscopy with polypectomy     adenomatous; [name]   JOINT REPLACEMENT Left    knee   left knee arthroscopy     LYMPHADENECTOMY Bilateral 01/23/2017   Procedure: BILATERAL LYMPHADENECTOMY;  Surgeon: [name], MD;  Location: [facility];  Service: Urology;  Laterality: Bilateral;   PROSTATECTOMY     01/23/17 [name]   ROBOT ASSISTED LAPAROSCOPIC RADICAL PROSTATECTOMY N/A 01/23/2017   Procedure: XI ROBOTIC ASSISTED LAPAROSCOPIC RADICAL PROSTATECTOMY LEVEL 3;  Surgeon: [name], MD;  Location: [facility];  Service: Urology;  Laterality: N/A;   TONSILLECTOMY AND ADENOIDECTOMY     TOTAL KNEE ARTHROPLASTY Left 01/11/2013   Procedure: LEFT TOTAL KNEE ARTHROPLASTY;  Surgeon: [name], MD;  Location: [facility];  Service: Orthopedics;  Laterality: Left;   TOTAL SHOULDER ARTHROPLASTY Left 08/12/2014   Procedure: LEFT TOTAL SHOULDER ARTHROPLASTY;  Surgeon: [name], MD;  Location: [facility];  Service: Orthopedics;  Laterality: Left;   UMBILICAL HERNIA REPAIR      FAMILY HISTORY:  Family History  Problem Relation Age of Onset   Lung cancer Mother        smoker/lung ca and colon?   Alcohol abuse Mother    Diabetes Mother    Diabetes  Father    Coronary artery disease Father        CBAG in late 64s   Cancer Father        prostate   Heart attack Father    Stroke Neg Hx     SOCIAL HISTORY:  Social History   Socioeconomic History   Marital status: Married    Spouse name: Not on file   Number of children: Not on file   Years of education: Not on file   Highest education level: Not on file  Occupational History   Not on file  Social Needs   Financial resource strain: Not on file   Food insecurity    Worry: Not on file    Inability: Not on file   Transportation needs    Medical: Not on file    Non-medical: Not on file  Tobacco Use   Smoking status:  Former Smoker    Packs/day: 2.00    Years: 30.00    Pack years: 60.00    Types: Cigarettes    Start date: 07/15/1962    Quit date: 08/15/1992    Years since quitting: 26.6   Smokeless tobacco: Never Used   Tobacco comment: smoked 14 -42 up to 2 ppd  Substance and Sexual Activity   Alcohol use: Yes    Alcohol/week: 4.0 standard drinks    Types: 4 Glasses of wine per week    Comment: Red Wine   Drug use: No   Sexual activity: Yes  Lifestyle   Physical activity    Days per week: Not on file    Minutes per session: Not on file   Stress: Not on file  Relationships   Social connections    Talks on phone: Not on file    Gets together: Not on file    Attends religious service: Not on file    Active member of club or organization: Not on file    Attends meetings of clubs or organizations: Not on file    Relationship status: Not on file   Intimate partner violence    Fear of current or ex partner: Not on file    Emotionally abused: Not on file    Physically abused: Not on file    Forced sexual activity: Not on file  Other Topics Concern   Not on file  Social History Narrative   Not on file  The patient is married and lives in [place]. He is a Designer, jewellery and works in critical care. He enjoys shooting long range rifles.  ALLERGIES: Cymbalta [duloxetine hcl] and Tetracycline  MEDICATIONS:  Current Outpatient Medications  Medication Sig Dispense Refill   amLODipine (NORVASC) 10 MG tablet Take 0.5 tablets (5 mg total) by mouth every morning. 45 tablet 3   Cholecalciferol (VITAMIN D3) 50 MCG (2000 UT) capsule Take 1 capsule (2,000 Units total) by mouth daily. 100 capsule 3   doxazosin (CARDURA) 4 MG tablet Take 1 tablet (4 mg total) by mouth daily. 90 tablet 3   glucose blood (ACCU-CHEK GUIDE) test strip Use to check blood sugar 4 times a day 400 each 12   hydrochlorothiazide (HYDRODIURIL) 25 MG tablet TAKE 1 TABLET BY MOUTH DAILY. 90 tablet 0   Icosapent  Ethyl (VASCEPA) 1 g CAPS Take 2 capsules (2 g total) by mouth 2 (two) times daily. 120 capsule 11   Lancets Misc. (ACCU-CHEK FASTCLIX LANCET) KIT Use as directed 1 kit 5   losartan (COZAAR) 100 MG tablet Take 1 tablet (100  mg total) by mouth daily. 90 tablet 3   naproxen sodium (ALEVE) 220 MG tablet Take 220 mg by mouth.     psyllium (METAMUCIL) 58.6 % packet Take 1 packet by mouth daily.     aspirin EC 81 MG tablet Take 1 tablet (81 mg total) by mouth daily. (Patient not taking: Reported on 04/06/2019) 100 tablet 3   UNIFINE PENTIPS 31G X 5 MM MISC USE FOR VICTOZA DAILY AS DIRECTED (Patient not taking: Reported on 04/06/2019) 100 each PRN   No current facility-administered medications for this encounter.     REVIEW OF SYSTEMS:  On review of systems, the patient reports that he is doing well overall. He denies any chest pain, shortness of breath, cough, fevers, chills, night sweats, unintended weight changes. He denies any bowel disturbances, and denies abdominal pain, nausea or vomiting. He denies any new musculoskeletal or joint aches or pains.  A complete review of systems is obtained and is otherwise negative.   PHYSICAL EXAM:  Wt Readings from Last 3 Encounters:  04/06/19 (!) 305 lb (138.3 kg)  12/30/18 290 lb (131.5 kg)  08/25/18 (!) 305 lb (138.3 kg)   Temp Readings from Last 3 Encounters:  04/06/19 98.9 F (37.2 C) (Temporal)  12/30/18 98.4 F (36.9 C)  08/25/18 98.4 F (36.9 C) (Oral)   BP Readings from Last 3 Encounters:  04/06/19 (!) 163/72  12/30/18 (!) 147/78  08/25/18 132/72   Pulse Readings from Last 3 Encounters:  04/06/19 (!) 56  12/30/18 64  08/25/18 (!) 56   Pain Assessment Pain Score: 2  Pain Frequency: Occasional/10  In general this is a well appearing caucasian male in no acute distress. He is alert and oriented x4 and appropriate throughout the examination. HEENT reveals that the patient is normocephalic, atraumatic. EOMs are intact. PERRLA.  Cardiopulmonary assessment is negative for acute distress and he exhibits normal effort.   KPS = 90  100 - Normal; no complaints; no evidence of disease. 90   - Able to carry on normal activity; [place] signs or symptoms of disease. 80   - Normal activity with effort; some signs or symptoms of disease. 40   - Cares for self; unable to carry on normal activity or to do active work. 60   - Requires occasional assistance, but is able to care for most of his personal needs. 50   - Requires considerable assistance and frequent medical care. 54   - Disabled; requires special care and assistance. 57   - Severely disabled; hospital admission is indicated although death not imminent. 56   - Very sick; hospital admission necessary; active supportive treatment necessary. 10   - Moribund; fatal processes progressing rapidly. 0     - Dead  Karnofsky DA, Abelmann [place], Craver LS and Burchenal [facility] [phone]) The use of the nitrogen mustards in the palliative treatment of carcinoma: with particular reference to bronchogenic carcinoma Cancer 1 634-56  LABORATORY DATA:  Lab Results  Component Value Date   WBC 6.9 08/25/2018   HGB 14.4 08/25/2018   HCT 41.7 08/25/2018   MCV 92.0 08/25/2018   PLT 253.0 08/25/2018   Lab Results  Component Value Date   NA 139 08/25/2018   K 3.8 08/25/2018   CL 99 08/25/2018   CO2 32 08/25/2018   Lab Results  Component Value Date   ALT 27 08/25/2018   AST 24 08/25/2018   ALKPHOS 59 08/25/2018   BILITOT 0.6 08/25/2018     RADIOGRAPHY: No results  found.    IMPRESSION/PLAN: 1. 68 y.o. gentleman with biochemical recurrence of prostate cancer as evidenced by rising, detectable PSA of 0.21 s/p RALP in 01/2017 for pT3aN0, Gleason 3+4,  adenocarcinoma of the prostate. Today, we talked to the patient about the findings and workup thus far, specifically regarding the continued trend of gradual rise in PSA, indicating biochemical recurrence of his prostate cancer. We also reviewed  the implications of extracapsular extension and perineural invasion on the risk of prostate cancer recurrence as it relates here. We reviewed the available radiation techniques, and focused on the details of logistics and delivery pertaining to salvage radiotherapy. The recommendation is to proceed with a 7.5 week course of daily radiotherapy directed at the prostate fossa. We reviewed the anticipated acute and late sequelae associated with radiation in this setting. The patient was encouraged to ask questions that were answered to his satisfaction.  At the end of our conversation, the patient appears to have a good understanding of his disease and our recommendations which are of curative intent.  He is interested in proceeding with salvage radiation but has a vacation planned for the end of October, so he would like to postpone the start of radiation for after he returns.  Therefore, he has been scheduled for CT simulation on Thursday, 05/20/2019 at 2 pm in anticipation of beginning treatment on 05/27/2019.  We will share this discussion with [name] and proceed with treatment planning accordingly.   Nicholos Johns, PA-C    Tyler Pita, MD  [facility] Direct Dial: [phone]   Fax: [phone] [email or web address]   Skype   [email or web address]   This document serves as a record of services personally performed by Tyler Pita, MD and Freeman Caldron, PA-C. It was created on their behalf by Wilburn Mylar, a trained medical scribe. The creation of this record is based on the scribe's personal observations and the provider's statements to them. This document has been checked and approved by the attending provider.

## 2019-04-14 MED FILL — LOSARTAN POTASSIUM 100 MG T: 100 | 30 days supply | Qty: 30 | Fill #5

## 2019-04-29 MED FILL — ACCU-CHEK GUIDE STRP: 50 days supply | Qty: 200 | Fill #1

## 2019-05-12 ENCOUNTER — Other Ambulatory Visit: Payer: Self-pay | Admitting: Pulmonary Disease

## 2019-05-12 MED FILL — LOSARTAN POTASSIUM 100 MG T: 100 | 30 days supply | Qty: 30 | Fill #6

## 2019-05-12 MED FILL — AMLODIPINE BESYLATE 10 MG T: 10 | 90 days supply | Qty: 45 | Fill #1

## 2019-05-14 ENCOUNTER — Other Ambulatory Visit: Payer: Self-pay

## 2019-05-14 MED ORDER — HYDROCHLOROTHIAZIDE 25 MG PO TABS: 25.0000 mg | ORAL_TABLET | Freq: Every day | ORAL | 0 refills | Status: DC

## 2019-05-14 MED FILL — HYDROCHLOROTHIAZIDE 25 MG T: 25 | 90 days supply | Qty: 90 | Fill #0

## 2019-05-20 ENCOUNTER — Other Ambulatory Visit: Payer: Self-pay

## 2019-05-20 ENCOUNTER — Ambulatory Visit
Admission: RE | Admit: 2019-05-20 | Discharge: 2019-05-20 | Disposition: A | Discharge status: Home / Self Care | Payer: 59 | Source: Ambulatory Visit | Attending: Radiation Oncology | Admitting: Radiation Oncology

## 2019-05-20 DIAGNOSIS — C61 Malignant neoplasm of prostate: Secondary | ICD-10-CM | POA: Insufficient documentation

## 2019-05-20 DIAGNOSIS — Z510 Encounter for antineoplastic radiation therapy: Secondary | ICD-10-CM | POA: Insufficient documentation

## 2019-05-25 NOTE — Progress Notes (Signed)
  Radiation Oncology         [phone] ________________________________  Name: Rodney Hayes Liz MRN: [id number]  Date: 05/20/2019  DOB: 1951/03/03  SIMULATION AND TREATMENT PLANNING NOTE    ICD-10-CM   1. Prostate cancer [facility])  C61     DIAGNOSIS:  68 year-old gentleman s/p prostatectomy with extracapsular extension and detectable post-op PSA of 0.101 for Gleason 4+3 prostate cancer  NARRATIVE:  The patient was brought to the [facility].  Identity was confirmed.  All relevant records and images related to the planned course of therapy were reviewed.  The patient freely provided informed written consent to proceed with treatment after reviewing the details related to the planned course of therapy. The consent form was witnessed and verified by the simulation staff.  Then, the patient was set-up in a stable reproducible supine position for radiation therapy.  A vacuum lock pillow device was custom fabricated to position his legs in a reproducible immobilized position.  Then, I performed a urethrogram under sterile conditions to identify the prostatic bed.  CT images were obtained.  Surface markings were placed.  The CT images were loaded into the planning software.  Then the prostate bed target, pelvic lymph node target and avoidance structures including the rectum, bladder, bowel and hips were contoured.  Treatment planning then occurred.  The radiation prescription was entered and confirmed.  A total of one complex treatment devices were fabricated. I have requested : Intensity Modulated Radiotherapy (IMRT) is medically necessary for this case for the following reason:  Rectal sparing.Marland Kitchen  PLAN:  The patient will receive 45 Gy in 25 fractions of 1.8 Gy, followed by a boost to the prostate bed to a total dose of 68.4 Gy with 13 additional fractions of 1.8 Gy.   ________________________________  Sheral Apley Tammi Klippel, M.D.

## 2019-05-27 DIAGNOSIS — C61 Malignant neoplasm of prostate: Secondary | ICD-10-CM | POA: Diagnosis not present

## 2019-05-27 DIAGNOSIS — Z510 Encounter for antineoplastic radiation therapy: Secondary | ICD-10-CM | POA: Diagnosis not present

## 2019-06-01 ENCOUNTER — Other Ambulatory Visit: Payer: Self-pay

## 2019-06-01 ENCOUNTER — Ambulatory Visit
Admission: RE | Admit: 2019-06-01 | Discharge: 2019-06-01 | Disposition: A | Discharge status: Home / Self Care | Payer: 59 | Source: Ambulatory Visit | Attending: Radiation Oncology | Admitting: Radiation Oncology

## 2019-06-01 DIAGNOSIS — Z510 Encounter for antineoplastic radiation therapy: Secondary | ICD-10-CM | POA: Diagnosis not present

## 2019-06-01 DIAGNOSIS — C61 Malignant neoplasm of prostate: Secondary | ICD-10-CM | POA: Diagnosis not present

## 2019-06-02 ENCOUNTER — Ambulatory Visit
Admission: RE | Admit: 2019-06-02 | Discharge: 2019-06-02 | Disposition: A | Discharge status: Home / Self Care | Payer: 59 | Source: Ambulatory Visit | Attending: Radiation Oncology | Admitting: Radiation Oncology

## 2019-06-02 DIAGNOSIS — Z510 Encounter for antineoplastic radiation therapy: Secondary | ICD-10-CM | POA: Diagnosis not present

## 2019-06-02 DIAGNOSIS — C61 Malignant neoplasm of prostate: Secondary | ICD-10-CM | POA: Diagnosis not present

## 2019-06-03 ENCOUNTER — Other Ambulatory Visit: Payer: Self-pay

## 2019-06-03 ENCOUNTER — Ambulatory Visit
Admission: RE | Admit: 2019-06-03 | Discharge: 2019-06-03 | Disposition: A | Discharge status: Home / Self Care | Payer: 59 | Source: Ambulatory Visit | Attending: Radiation Oncology | Admitting: Radiation Oncology

## 2019-06-03 DIAGNOSIS — Z510 Encounter for antineoplastic radiation therapy: Secondary | ICD-10-CM | POA: Diagnosis not present

## 2019-06-03 DIAGNOSIS — C61 Malignant neoplasm of prostate: Secondary | ICD-10-CM | POA: Diagnosis not present

## 2019-06-04 ENCOUNTER — Ambulatory Visit
Admission: RE | Admit: 2019-06-04 | Discharge: 2019-06-04 | Disposition: A | Discharge status: Home / Self Care | Payer: 59 | Source: Ambulatory Visit | Attending: Radiation Oncology | Admitting: Radiation Oncology

## 2019-06-04 ENCOUNTER — Other Ambulatory Visit: Payer: Self-pay

## 2019-06-04 DIAGNOSIS — Z510 Encounter for antineoplastic radiation therapy: Secondary | ICD-10-CM | POA: Diagnosis not present

## 2019-06-04 DIAGNOSIS — C61 Malignant neoplasm of prostate: Secondary | ICD-10-CM | POA: Diagnosis not present

## 2019-06-06 ENCOUNTER — Ambulatory Visit
Admission: RE | Admit: 2019-06-06 | Discharge: 2019-06-06 | Disposition: A | Discharge status: Home / Self Care | Payer: 59 | Source: Ambulatory Visit | Attending: Radiation Oncology | Admitting: Radiation Oncology

## 2019-06-06 ENCOUNTER — Other Ambulatory Visit: Payer: Self-pay

## 2019-06-06 DIAGNOSIS — C61 Malignant neoplasm of prostate: Secondary | ICD-10-CM | POA: Diagnosis not present

## 2019-06-06 DIAGNOSIS — Z510 Encounter for antineoplastic radiation therapy: Secondary | ICD-10-CM | POA: Diagnosis not present

## 2019-06-07 ENCOUNTER — Ambulatory Visit
Admission: RE | Admit: 2019-06-07 | Discharge: 2019-06-07 | Disposition: A | Discharge status: Home / Self Care | Payer: 59 | Source: Ambulatory Visit | Attending: Radiation Oncology | Admitting: Radiation Oncology

## 2019-06-07 ENCOUNTER — Other Ambulatory Visit: Payer: Self-pay

## 2019-06-07 DIAGNOSIS — Z510 Encounter for antineoplastic radiation therapy: Secondary | ICD-10-CM | POA: Diagnosis not present

## 2019-06-07 DIAGNOSIS — C61 Malignant neoplasm of prostate: Secondary | ICD-10-CM | POA: Diagnosis not present

## 2019-06-08 ENCOUNTER — Other Ambulatory Visit: Payer: Self-pay

## 2019-06-08 ENCOUNTER — Ambulatory Visit
Admission: RE | Admit: 2019-06-08 | Discharge: 2019-06-08 | Disposition: A | Discharge status: Home / Self Care | Payer: 59 | Source: Ambulatory Visit | Attending: Radiation Oncology | Admitting: Radiation Oncology

## 2019-06-08 DIAGNOSIS — Z510 Encounter for antineoplastic radiation therapy: Secondary | ICD-10-CM | POA: Diagnosis not present

## 2019-06-08 DIAGNOSIS — C61 Malignant neoplasm of prostate: Secondary | ICD-10-CM | POA: Diagnosis not present

## 2019-06-09 ENCOUNTER — Ambulatory Visit
Admission: RE | Admit: 2019-06-09 | Discharge: 2019-06-09 | Disposition: A | Discharge status: Home / Self Care | Payer: 59 | Source: Ambulatory Visit | Attending: Radiation Oncology | Admitting: Radiation Oncology

## 2019-06-09 ENCOUNTER — Other Ambulatory Visit: Payer: Self-pay

## 2019-06-09 DIAGNOSIS — Z510 Encounter for antineoplastic radiation therapy: Secondary | ICD-10-CM | POA: Diagnosis not present

## 2019-06-09 DIAGNOSIS — C61 Malignant neoplasm of prostate: Secondary | ICD-10-CM | POA: Diagnosis not present

## 2019-06-14 ENCOUNTER — Ambulatory Visit
Admission: RE | Admit: 2019-06-14 | Discharge: 2019-06-14 | Disposition: A | Discharge status: Home / Self Care | Payer: 59 | Source: Ambulatory Visit | Attending: Radiation Oncology | Admitting: Radiation Oncology

## 2019-06-14 ENCOUNTER — Other Ambulatory Visit: Payer: Self-pay

## 2019-06-14 DIAGNOSIS — Z510 Encounter for antineoplastic radiation therapy: Secondary | ICD-10-CM | POA: Diagnosis not present

## 2019-06-14 DIAGNOSIS — C61 Malignant neoplasm of prostate: Secondary | ICD-10-CM | POA: Diagnosis not present

## 2019-06-14 MED FILL — DOXAZOSIN MESYLATE 4 MG TAB: 4 | 90 days supply | Qty: 90 | Fill #3

## 2019-06-14 MED FILL — LOSARTAN POTASSIUM 100 MG T: 100 | 30 days supply | Qty: 30 | Fill #7

## 2019-06-14 MED FILL — VASCEPA 1 GM CAPSULE: 1 | 30 days supply | Qty: 120 | Fill #2

## 2019-06-15 ENCOUNTER — Ambulatory Visit: Payer: 59

## 2019-06-15 ENCOUNTER — Telehealth: Payer: Self-pay | Admitting: Radiation Oncology

## 2019-06-15 NOTE — Telephone Encounter (Signed)
Received call from patient reporting a positive COVID exposure. Patient endorses testing with results expected to return in 6-12 hours. Radiation treatment cancelled for today. If test results negative patient will return tomorrow to resume treatment as scheduled. L3 therapist and Dr. Tammi Klippel informed via email immediately.

## 2019-06-16 ENCOUNTER — Ambulatory Visit
Admission: RE | Admit: 2019-06-16 | Discharge: 2019-06-16 | Disposition: A | Discharge status: Home / Self Care | Payer: 59 | Source: Ambulatory Visit | Attending: Radiation Oncology | Admitting: Radiation Oncology

## 2019-06-16 ENCOUNTER — Other Ambulatory Visit: Payer: Self-pay

## 2019-06-16 DIAGNOSIS — Z510 Encounter for antineoplastic radiation therapy: Secondary | ICD-10-CM | POA: Diagnosis not present

## 2019-06-16 DIAGNOSIS — C61 Malignant neoplasm of prostate: Secondary | ICD-10-CM | POA: Diagnosis not present

## 2019-06-17 ENCOUNTER — Ambulatory Visit
Admission: RE | Admit: 2019-06-17 | Discharge: 2019-06-17 | Disposition: A | Discharge status: Home / Self Care | Payer: 59 | Source: Ambulatory Visit | Attending: Radiation Oncology | Admitting: Radiation Oncology

## 2019-06-17 ENCOUNTER — Other Ambulatory Visit: Payer: Self-pay

## 2019-06-17 DIAGNOSIS — Z510 Encounter for antineoplastic radiation therapy: Secondary | ICD-10-CM | POA: Diagnosis not present

## 2019-06-17 DIAGNOSIS — C61 Malignant neoplasm of prostate: Secondary | ICD-10-CM | POA: Diagnosis not present

## 2019-06-18 ENCOUNTER — Ambulatory Visit
Admission: RE | Admit: 2019-06-18 | Discharge: 2019-06-18 | Disposition: A | Discharge status: Home / Self Care | Payer: 59 | Source: Ambulatory Visit | Attending: Radiation Oncology | Admitting: Radiation Oncology

## 2019-06-18 ENCOUNTER — Other Ambulatory Visit: Payer: Self-pay

## 2019-06-18 DIAGNOSIS — C61 Malignant neoplasm of prostate: Secondary | ICD-10-CM | POA: Diagnosis not present

## 2019-06-18 DIAGNOSIS — Z510 Encounter for antineoplastic radiation therapy: Secondary | ICD-10-CM | POA: Diagnosis not present

## 2019-06-21 ENCOUNTER — Other Ambulatory Visit: Payer: Self-pay

## 2019-06-21 ENCOUNTER — Ambulatory Visit
Admission: RE | Admit: 2019-06-21 | Discharge: 2019-06-21 | Disposition: A | Discharge status: Home / Self Care | Payer: 59 | Source: Ambulatory Visit | Attending: Radiation Oncology | Admitting: Radiation Oncology

## 2019-06-21 DIAGNOSIS — C61 Malignant neoplasm of prostate: Secondary | ICD-10-CM | POA: Diagnosis not present

## 2019-06-21 DIAGNOSIS — Z510 Encounter for antineoplastic radiation therapy: Secondary | ICD-10-CM | POA: Diagnosis not present

## 2019-06-22 ENCOUNTER — Ambulatory Visit
Admission: RE | Admit: 2019-06-22 | Discharge: 2019-06-22 | Disposition: A | Discharge status: Home / Self Care | Payer: 59 | Source: Ambulatory Visit | Attending: Radiation Oncology | Admitting: Radiation Oncology

## 2019-06-22 ENCOUNTER — Other Ambulatory Visit: Payer: Self-pay

## 2019-06-22 DIAGNOSIS — Z510 Encounter for antineoplastic radiation therapy: Secondary | ICD-10-CM | POA: Diagnosis not present

## 2019-06-22 DIAGNOSIS — C61 Malignant neoplasm of prostate: Secondary | ICD-10-CM | POA: Diagnosis not present

## 2019-06-23 ENCOUNTER — Other Ambulatory Visit: Payer: Self-pay

## 2019-06-23 ENCOUNTER — Ambulatory Visit
Admission: RE | Admit: 2019-06-23 | Discharge: 2019-06-23 | Disposition: A | Discharge status: Home / Self Care | Payer: 59 | Source: Ambulatory Visit | Attending: Radiation Oncology | Admitting: Radiation Oncology

## 2019-06-23 DIAGNOSIS — C61 Malignant neoplasm of prostate: Secondary | ICD-10-CM | POA: Diagnosis not present

## 2019-06-23 DIAGNOSIS — Z510 Encounter for antineoplastic radiation therapy: Secondary | ICD-10-CM | POA: Diagnosis not present

## 2019-06-24 ENCOUNTER — Other Ambulatory Visit: Payer: Self-pay

## 2019-06-24 ENCOUNTER — Ambulatory Visit
Admission: RE | Admit: 2019-06-24 | Discharge: 2019-06-24 | Disposition: A | Discharge status: Home / Self Care | Payer: 59 | Source: Ambulatory Visit | Attending: Radiation Oncology | Admitting: Radiation Oncology

## 2019-06-24 DIAGNOSIS — Z510 Encounter for antineoplastic radiation therapy: Secondary | ICD-10-CM | POA: Diagnosis not present

## 2019-06-24 DIAGNOSIS — C61 Malignant neoplasm of prostate: Secondary | ICD-10-CM | POA: Diagnosis not present

## 2019-06-25 ENCOUNTER — Ambulatory Visit
Admission: RE | Admit: 2019-06-25 | Discharge: 2019-06-25 | Disposition: A | Discharge status: Home / Self Care | Payer: 59 | Source: Ambulatory Visit | Attending: Radiation Oncology | Admitting: Radiation Oncology

## 2019-06-25 ENCOUNTER — Other Ambulatory Visit: Payer: Self-pay

## 2019-06-25 DIAGNOSIS — Z510 Encounter for antineoplastic radiation therapy: Secondary | ICD-10-CM | POA: Diagnosis not present

## 2019-06-25 DIAGNOSIS — C61 Malignant neoplasm of prostate: Secondary | ICD-10-CM | POA: Diagnosis not present

## 2019-06-28 ENCOUNTER — Ambulatory Visit
Admission: RE | Admit: 2019-06-28 | Discharge: 2019-06-28 | Disposition: A | Discharge status: Home / Self Care | Payer: 59 | Source: Ambulatory Visit | Attending: Radiation Oncology | Admitting: Radiation Oncology

## 2019-06-28 ENCOUNTER — Other Ambulatory Visit: Payer: Self-pay

## 2019-06-28 DIAGNOSIS — Z510 Encounter for antineoplastic radiation therapy: Secondary | ICD-10-CM | POA: Diagnosis not present

## 2019-06-28 DIAGNOSIS — C61 Malignant neoplasm of prostate: Secondary | ICD-10-CM | POA: Diagnosis not present

## 2019-06-29 ENCOUNTER — Ambulatory Visit
Admission: RE | Admit: 2019-06-29 | Discharge: 2019-06-29 | Disposition: A | Discharge status: Home / Self Care | Payer: 59 | Source: Ambulatory Visit | Attending: Radiation Oncology | Admitting: Radiation Oncology

## 2019-06-29 ENCOUNTER — Other Ambulatory Visit: Payer: Self-pay

## 2019-06-29 DIAGNOSIS — Z510 Encounter for antineoplastic radiation therapy: Secondary | ICD-10-CM | POA: Diagnosis not present

## 2019-06-29 DIAGNOSIS — C61 Malignant neoplasm of prostate: Secondary | ICD-10-CM | POA: Diagnosis not present

## 2019-06-30 ENCOUNTER — Ambulatory Visit
Admission: RE | Admit: 2019-06-30 | Discharge: 2019-06-30 | Disposition: A | Discharge status: Home / Self Care | Payer: 59 | Source: Ambulatory Visit | Attending: Radiation Oncology | Admitting: Radiation Oncology

## 2019-06-30 ENCOUNTER — Other Ambulatory Visit: Payer: Self-pay

## 2019-06-30 DIAGNOSIS — Z510 Encounter for antineoplastic radiation therapy: Secondary | ICD-10-CM | POA: Diagnosis not present

## 2019-06-30 DIAGNOSIS — C61 Malignant neoplasm of prostate: Secondary | ICD-10-CM | POA: Diagnosis not present

## 2019-07-01 ENCOUNTER — Ambulatory Visit
Admission: RE | Admit: 2019-07-01 | Discharge: 2019-07-01 | Disposition: A | Discharge status: Home / Self Care | Payer: 59 | Source: Ambulatory Visit | Attending: Radiation Oncology | Admitting: Radiation Oncology

## 2019-07-01 DIAGNOSIS — Z510 Encounter for antineoplastic radiation therapy: Secondary | ICD-10-CM | POA: Diagnosis not present

## 2019-07-01 DIAGNOSIS — C61 Malignant neoplasm of prostate: Secondary | ICD-10-CM | POA: Diagnosis not present

## 2019-07-02 ENCOUNTER — Ambulatory Visit
Admission: RE | Admit: 2019-07-02 | Discharge: 2019-07-02 | Disposition: A | Discharge status: Home / Self Care | Payer: 59 | Source: Ambulatory Visit | Attending: Radiation Oncology | Admitting: Radiation Oncology

## 2019-07-02 ENCOUNTER — Other Ambulatory Visit: Payer: Self-pay

## 2019-07-02 DIAGNOSIS — C61 Malignant neoplasm of prostate: Secondary | ICD-10-CM | POA: Diagnosis not present

## 2019-07-02 DIAGNOSIS — Z510 Encounter for antineoplastic radiation therapy: Secondary | ICD-10-CM | POA: Diagnosis not present

## 2019-07-05 ENCOUNTER — Other Ambulatory Visit: Payer: Self-pay

## 2019-07-05 ENCOUNTER — Ambulatory Visit
Admission: RE | Admit: 2019-07-05 | Discharge: 2019-07-05 | Disposition: A | Discharge status: Home / Self Care | Payer: 59 | Source: Ambulatory Visit | Attending: Radiation Oncology | Admitting: Radiation Oncology

## 2019-07-05 DIAGNOSIS — Z510 Encounter for antineoplastic radiation therapy: Secondary | ICD-10-CM | POA: Diagnosis not present

## 2019-07-05 DIAGNOSIS — C61 Malignant neoplasm of prostate: Secondary | ICD-10-CM | POA: Diagnosis not present

## 2019-07-06 ENCOUNTER — Other Ambulatory Visit: Payer: Self-pay

## 2019-07-06 ENCOUNTER — Ambulatory Visit
Admission: RE | Admit: 2019-07-06 | Discharge: 2019-07-06 | Disposition: A | Discharge status: Home / Self Care | Payer: 59 | Source: Ambulatory Visit | Attending: Radiation Oncology | Admitting: Radiation Oncology

## 2019-07-06 DIAGNOSIS — C61 Malignant neoplasm of prostate: Secondary | ICD-10-CM | POA: Diagnosis not present

## 2019-07-06 DIAGNOSIS — Z510 Encounter for antineoplastic radiation therapy: Secondary | ICD-10-CM | POA: Diagnosis not present

## 2019-07-07 ENCOUNTER — Ambulatory Visit: Payer: 59

## 2019-07-07 ENCOUNTER — Ambulatory Visit
Admission: RE | Admit: 2019-07-07 | Discharge: 2019-07-07 | Disposition: A | Discharge status: Home / Self Care | Payer: 59 | Source: Ambulatory Visit | Attending: Radiation Oncology | Admitting: Radiation Oncology

## 2019-07-07 ENCOUNTER — Other Ambulatory Visit: Payer: Self-pay

## 2019-07-07 DIAGNOSIS — Z510 Encounter for antineoplastic radiation therapy: Secondary | ICD-10-CM | POA: Diagnosis not present

## 2019-07-07 DIAGNOSIS — C61 Malignant neoplasm of prostate: Secondary | ICD-10-CM | POA: Diagnosis not present

## 2019-07-08 ENCOUNTER — Ambulatory Visit
Admission: RE | Admit: 2019-07-08 | Discharge: 2019-07-08 | Disposition: A | Discharge status: Home / Self Care | Payer: 59 | Source: Ambulatory Visit | Attending: Radiation Oncology | Admitting: Radiation Oncology

## 2019-07-08 ENCOUNTER — Other Ambulatory Visit: Payer: Self-pay

## 2019-07-08 ENCOUNTER — Ambulatory Visit: Payer: 59

## 2019-07-08 DIAGNOSIS — C61 Malignant neoplasm of prostate: Secondary | ICD-10-CM | POA: Diagnosis not present

## 2019-07-08 DIAGNOSIS — Z510 Encounter for antineoplastic radiation therapy: Secondary | ICD-10-CM | POA: Diagnosis not present

## 2019-07-09 ENCOUNTER — Ambulatory Visit: Payer: 59

## 2019-07-12 ENCOUNTER — Ambulatory Visit
Admission: RE | Admit: 2019-07-12 | Discharge: 2019-07-12 | Disposition: A | Discharge status: Home / Self Care | Payer: 59 | Source: Ambulatory Visit | Attending: Radiation Oncology | Admitting: Radiation Oncology

## 2019-07-12 DIAGNOSIS — C61 Malignant neoplasm of prostate: Secondary | ICD-10-CM | POA: Diagnosis not present

## 2019-07-12 DIAGNOSIS — Z510 Encounter for antineoplastic radiation therapy: Secondary | ICD-10-CM | POA: Diagnosis not present

## 2019-07-13 ENCOUNTER — Ambulatory Visit
Admission: RE | Admit: 2019-07-13 | Discharge: 2019-07-13 | Disposition: A | Discharge status: Home / Self Care | Payer: 59 | Source: Ambulatory Visit | Attending: Radiation Oncology | Admitting: Radiation Oncology

## 2019-07-13 ENCOUNTER — Other Ambulatory Visit: Payer: Self-pay

## 2019-07-13 ENCOUNTER — Other Ambulatory Visit: Payer: Self-pay | Admitting: Radiation Oncology

## 2019-07-13 DIAGNOSIS — C61 Malignant neoplasm of prostate: Secondary | ICD-10-CM | POA: Diagnosis not present

## 2019-07-13 DIAGNOSIS — Z510 Encounter for antineoplastic radiation therapy: Secondary | ICD-10-CM | POA: Diagnosis not present

## 2019-07-13 MED ORDER — OXYCODONE HCL 10 MG PO TABS: 10.0000 mg | ORAL_TABLET | Freq: Four times a day (QID) | ORAL | 0 refills | Status: DC | PRN

## 2019-07-13 MED FILL — oxyCODONE HCL 10 MG TABS: 10 | 6 days supply | Qty: 25 | Fill #0

## 2019-07-14 ENCOUNTER — Ambulatory Visit
Admission: RE | Admit: 2019-07-14 | Discharge: 2019-07-14 | Disposition: A | Discharge status: Home / Self Care | Payer: 59 | Source: Ambulatory Visit | Attending: Radiation Oncology | Admitting: Radiation Oncology

## 2019-07-14 ENCOUNTER — Other Ambulatory Visit: Payer: Self-pay

## 2019-07-14 DIAGNOSIS — Z510 Encounter for antineoplastic radiation therapy: Secondary | ICD-10-CM | POA: Diagnosis not present

## 2019-07-14 DIAGNOSIS — C61 Malignant neoplasm of prostate: Secondary | ICD-10-CM | POA: Diagnosis not present

## 2019-07-15 ENCOUNTER — Ambulatory Visit
Admission: RE | Admit: 2019-07-15 | Discharge: 2019-07-15 | Disposition: A | Discharge status: Home / Self Care | Payer: 59 | Source: Ambulatory Visit | Attending: Radiation Oncology | Admitting: Radiation Oncology

## 2019-07-15 DIAGNOSIS — Z510 Encounter for antineoplastic radiation therapy: Secondary | ICD-10-CM | POA: Diagnosis not present

## 2019-07-15 DIAGNOSIS — C61 Malignant neoplasm of prostate: Secondary | ICD-10-CM | POA: Diagnosis not present

## 2019-07-19 ENCOUNTER — Other Ambulatory Visit: Payer: Self-pay

## 2019-07-19 ENCOUNTER — Ambulatory Visit
Admission: RE | Admit: 2019-07-19 | Discharge: 2019-07-19 | Disposition: A | Discharge status: Home / Self Care | Payer: 59 | Source: Ambulatory Visit | Attending: Radiation Oncology | Admitting: Radiation Oncology

## 2019-07-19 DIAGNOSIS — Z510 Encounter for antineoplastic radiation therapy: Secondary | ICD-10-CM | POA: Diagnosis not present

## 2019-07-19 DIAGNOSIS — C61 Malignant neoplasm of prostate: Secondary | ICD-10-CM | POA: Diagnosis not present

## 2019-07-20 ENCOUNTER — Other Ambulatory Visit: Payer: Self-pay

## 2019-07-20 ENCOUNTER — Ambulatory Visit
Admission: RE | Admit: 2019-07-20 | Discharge: 2019-07-20 | Disposition: A | Discharge status: Home / Self Care | Payer: 59 | Source: Ambulatory Visit | Attending: Radiation Oncology | Admitting: Radiation Oncology

## 2019-07-20 DIAGNOSIS — Z510 Encounter for antineoplastic radiation therapy: Secondary | ICD-10-CM | POA: Diagnosis not present

## 2019-07-20 DIAGNOSIS — C61 Malignant neoplasm of prostate: Secondary | ICD-10-CM | POA: Diagnosis not present

## 2019-07-21 ENCOUNTER — Ambulatory Visit
Admission: RE | Admit: 2019-07-21 | Discharge: 2019-07-21 | Disposition: A | Discharge status: Home / Self Care | Payer: 59 | Source: Ambulatory Visit | Attending: Radiation Oncology | Admitting: Radiation Oncology

## 2019-07-21 DIAGNOSIS — Z510 Encounter for antineoplastic radiation therapy: Secondary | ICD-10-CM | POA: Diagnosis not present

## 2019-07-21 DIAGNOSIS — C61 Malignant neoplasm of prostate: Secondary | ICD-10-CM | POA: Diagnosis not present

## 2019-07-22 ENCOUNTER — Other Ambulatory Visit: Payer: Self-pay

## 2019-07-22 ENCOUNTER — Ambulatory Visit
Admission: RE | Admit: 2019-07-22 | Discharge: 2019-07-22 | Disposition: A | Discharge status: Home / Self Care | Payer: 59 | Source: Ambulatory Visit | Attending: Radiation Oncology | Admitting: Radiation Oncology

## 2019-07-22 DIAGNOSIS — C61 Malignant neoplasm of prostate: Secondary | ICD-10-CM | POA: Diagnosis not present

## 2019-07-22 DIAGNOSIS — Z510 Encounter for antineoplastic radiation therapy: Secondary | ICD-10-CM | POA: Diagnosis not present

## 2019-07-23 ENCOUNTER — Ambulatory Visit
Admission: RE | Admit: 2019-07-23 | Discharge: 2019-07-23 | Disposition: A | Discharge status: Home / Self Care | Payer: 59 | Source: Ambulatory Visit | Attending: Radiation Oncology | Admitting: Radiation Oncology

## 2019-07-23 ENCOUNTER — Other Ambulatory Visit: Payer: Self-pay

## 2019-07-23 DIAGNOSIS — Z510 Encounter for antineoplastic radiation therapy: Secondary | ICD-10-CM | POA: Diagnosis not present

## 2019-07-23 DIAGNOSIS — C61 Malignant neoplasm of prostate: Secondary | ICD-10-CM | POA: Diagnosis not present

## 2019-07-26 ENCOUNTER — Ambulatory Visit
Admission: RE | Admit: 2019-07-26 | Discharge: 2019-07-26 | Disposition: A | Discharge status: Home / Self Care | Payer: 59 | Source: Ambulatory Visit | Attending: Radiation Oncology | Admitting: Radiation Oncology

## 2019-07-26 ENCOUNTER — Other Ambulatory Visit: Payer: Self-pay

## 2019-07-26 DIAGNOSIS — C61 Malignant neoplasm of prostate: Secondary | ICD-10-CM | POA: Diagnosis not present

## 2019-07-26 DIAGNOSIS — Z510 Encounter for antineoplastic radiation therapy: Secondary | ICD-10-CM | POA: Diagnosis not present

## 2019-07-26 MED FILL — LOSARTAN POTASSIUM 100 MG T: 100 | 30 days supply | Qty: 30 | Fill #8

## 2019-07-27 ENCOUNTER — Ambulatory Visit: Payer: 59

## 2019-07-27 ENCOUNTER — Ambulatory Visit
Admission: RE | Admit: 2019-07-27 | Discharge: 2019-07-27 | Disposition: A | Discharge status: Home / Self Care | Payer: 59 | Source: Ambulatory Visit | Attending: Radiation Oncology | Admitting: Radiation Oncology

## 2019-07-27 DIAGNOSIS — C61 Malignant neoplasm of prostate: Secondary | ICD-10-CM | POA: Diagnosis not present

## 2019-07-27 DIAGNOSIS — Z510 Encounter for antineoplastic radiation therapy: Secondary | ICD-10-CM | POA: Diagnosis not present

## 2019-07-28 ENCOUNTER — Other Ambulatory Visit: Payer: Self-pay

## 2019-07-28 ENCOUNTER — Ambulatory Visit
Admission: RE | Admit: 2019-07-28 | Discharge: 2019-07-28 | Disposition: A | Discharge status: Home / Self Care | Payer: 59 | Source: Ambulatory Visit | Attending: Radiation Oncology | Admitting: Radiation Oncology

## 2019-07-28 ENCOUNTER — Ambulatory Visit: Payer: 59

## 2019-07-28 ENCOUNTER — Encounter: Payer: Self-pay | Admitting: Radiation Oncology

## 2019-07-28 DIAGNOSIS — C61 Malignant neoplasm of prostate: Secondary | ICD-10-CM | POA: Diagnosis not present

## 2019-07-28 DIAGNOSIS — Z510 Encounter for antineoplastic radiation therapy: Secondary | ICD-10-CM | POA: Diagnosis not present

## 2019-08-08 NOTE — Progress Notes (Signed)
  Radiation Oncology         (336) [phone] ________________________________  Name: Rodney Hayes MRN: [id number]  Date: 07/28/2019  DOB: 1950-08-28  End of Treatment Note  Diagnosis:    69 year-old gentleman s/p prostatectomy with extracapsular extension and detectable post-op PSA of 0.101 for Gleason 4+3 prostate cancer     Indication for treatment:  Curative, Definitive Radiotherapy       Radiation treatment dates:   06/01/19-07/28/19  Site/dose:  1. The prostate fossa and pelvic lymph nodes were initially treated to 45 Gy in 25 fractions of 1.8 Gy  2. The prostate fossa only was boosted to 68.4 Gy with 13 additional fractions of 1.8 Gy   Beams/energy:  1. The prostate fossa  and pelvic lymph nodes were initially treated using VMAT intensity modulated radiotherapy delivering 6 megavolt photons. Image guidance was performed with CB-CT studies prior to each fraction. He was immobilized with a body fix lower extremity mold.  2. The prostate fossa only was boosted using VMAT intensity modulated radiotherapy delivering 6 megavolt photons. Image guidance was performed with CB-CT studies prior to each fraction. He was immobilized with a body fix lower extremity mold.  Narrative: The patient tolerated radiation treatment relatively well.   The patient experienced some Klahn urinary irritation and modest fatigue.  He continued to work through radiation without significant limitation.  Plan: The patient has completed radiation treatment. He will return to radiation oncology clinic for routine followup in one month. I advised him to call or return sooner if he has any questions or concerns related to his recovery or treatment. ________________________________  Sheral Apley. Tammi Klippel, M.D.

## 2019-08-09 ENCOUNTER — Other Ambulatory Visit: Payer: Self-pay | Admitting: Internal Medicine

## 2019-08-09 MED FILL — AMLODIPINE BESYLATE 10 MG T: 10 | 90 days supply | Qty: 45 | Fill #0

## 2019-08-23 ENCOUNTER — Other Ambulatory Visit: Payer: Self-pay | Admitting: Internal Medicine

## 2019-08-23 ENCOUNTER — Telehealth: Payer: Self-pay

## 2019-08-23 DIAGNOSIS — E785 Hyperlipidemia, unspecified: Secondary | ICD-10-CM

## 2019-08-23 DIAGNOSIS — Z0000 Encounter for general adult medical examination without abnormal findings: Secondary | ICD-10-CM

## 2019-08-23 DIAGNOSIS — E1165 Type 2 diabetes mellitus with hyperglycemia: Secondary | ICD-10-CM

## 2019-08-23 MED FILL — LOSARTAN POTASSIUM 100 MG T: 100 | 30 days supply | Qty: 30 | Fill #9

## 2019-08-23 NOTE — Telephone Encounter (Signed)
New message   Patient having physical in March asking for blood work prior to visit.

## 2019-08-24 MED FILL — HYDROCHLOROTHIAZIDE 25 MG T: 25 | 90 days supply | Qty: 90 | Fill #0

## 2019-08-25 MED FILL — IBUPROFEN 400 MG TABS: 400 | 7 days supply | Qty: 30 | Fill #0

## 2019-08-25 MED FILL — AMOXICILLIN 500 MG CAPSULE: 500 | 7 days supply | Qty: 21 | Fill #0

## 2019-08-25 MED FILL — HYDROCODON-APAP 5-325: 5-325 | 1 days supply | Qty: 5 | Fill #0

## 2019-08-25 MED FILL — CHLORHEXIDINE 0.12% RINSE: 0.12 | 17 days supply | Qty: 473 | Fill #0

## 2019-08-26 NOTE — Telephone Encounter (Signed)
Notifief pt w/MD response. Made lab appt for 09/24/19../l,mb

## 2019-08-26 NOTE — Telephone Encounter (Signed)
Done. Thx.

## 2019-08-31 DIAGNOSIS — E119 Type 2 diabetes mellitus without complications: Secondary | ICD-10-CM | POA: Diagnosis not present

## 2019-08-31 DIAGNOSIS — H35033 Hypertensive retinopathy, bilateral: Secondary | ICD-10-CM | POA: Diagnosis not present

## 2019-08-31 DIAGNOSIS — H2513 Age-related nuclear cataract, bilateral: Secondary | ICD-10-CM | POA: Diagnosis not present

## 2019-08-31 DIAGNOSIS — H25013 Cortical age-related cataract, bilateral: Secondary | ICD-10-CM | POA: Diagnosis not present

## 2019-09-02 ENCOUNTER — Ambulatory Visit
Admission: RE | Admit: 2019-09-02 | Discharge: 2019-09-02 | Disposition: A | Discharge status: Home / Self Care | Payer: 59 | Source: Ambulatory Visit | Attending: Urology | Admitting: Urology

## 2019-09-02 ENCOUNTER — Other Ambulatory Visit: Payer: Self-pay

## 2019-09-02 ENCOUNTER — Encounter: Payer: Self-pay | Admitting: Urology

## 2019-09-02 DIAGNOSIS — C61 Malignant neoplasm of prostate: Secondary | ICD-10-CM

## 2019-09-02 NOTE — Progress Notes (Signed)
Radiation Oncology         (336) [phone] ________________________________  Name: Ledon Weihe [place] MRN: [id number]  Date: 09/02/2019  DOB: 1951-03-31  Post Treatment Note  CC: Plotnikov, Evie Lacks, MD  Noralee Space, MD  Diagnosis:   69 year-old gentleman s/p prostatectomy with extracapsular extension and detectable post-op PSA of 0.101 for Gleason 4+3 prostate cancer     Interval Since Last Radiation:  5 weeks  06/01/19-07/28/19: 1. The prostate fossa and pelvic lymph nodes were initially treated to 45 Gy in 25 fractions of 1.8 Gy  2. The prostate fossa only was boosted to 68.4 Gy with 13 additional fractions of 1.8 Gy    Narrative:  I spoke with the patient to conduct his routine scheduled 1 month follow up visit via telephone to spare the patient unnecessary potential exposure in the healthcare setting during the current COVID-19 pandemic.  The patient was notified in advance and gave permission to proceed with this visit format. He tolerated radiation treatment relatively well with only Cundy urinary irritation and modest fatigue.  He continued to work through radiation without significant limitation.                              On review of systems, the patient states that he is doing very well overall and almost completely back to his baseline regarding bowel and bladder function at this point.  He has had gradual improvement in the urgency, frequency and small volume leakage and reports that his bowels are essentially back to normal. He denies dysuria, gross hematuria, fever, chills or night sweats. He is quite pleased with his progress overall and denies any significant fatigue.  He has continued working and is increasing his physical activity gradually.   ALLERGIES:  is allergic to cymbalta [duloxetine hcl] and tetracycline.  Meds: Current Outpatient Medications  Medication Sig Dispense Refill  . doxazosin (CARDURA) 4 MG tablet Take 1 tablet (4 mg total) by mouth daily. 90 tablet  3  . glucose blood (ACCU-CHEK GUIDE) test strip Use to check blood sugar 4 times a day 400 each 12  . hydrochlorothiazide (HYDRODIURIL) 25 MG tablet TAKE 1 TABLET (25 MG TOTAL) BY MOUTH DAILY. 90 tablet 0  . Icosapent Ethyl (VASCEPA) 1 g CAPS Take 2 capsules (2 g total) by mouth 2 (two) times daily. 120 capsule 11  . Lancets Misc. (ACCU-CHEK FASTCLIX LANCET) KIT Use as directed 1 kit 5  . losartan (COZAAR) 100 MG tablet Take 1 tablet (100 mg total) by mouth daily. 90 tablet 3  . naproxen sodium (ALEVE) 220 MG tablet Take 220 mg by mouth.    . Oxycodone HCl 10 MG TABS Take 1 tablet (10 mg total) by mouth every 6 (six) hours as needed. 25 tablet 0  . psyllium (METAMUCIL) 58.6 % packet Take 1 packet by mouth daily.    Marland Kitchen amLODipine (NORVASC) 10 MG tablet Take 0.5 tablets (5 mg total) by mouth every morning. Annual appt due in Febuary must see provider for future refills 45 tablet 0  . Cholecalciferol (VITAMIN D3) 50 MCG (2000 UT) capsule Take 1 capsule (2,000 Units total) by mouth daily. 100 capsule 3  . UNIFINE PENTIPS 31G X 5 MM MISC USE FOR VICTOZA DAILY AS DIRECTED (Patient not taking: Reported on 04/06/2019) 100 each PRN   No current facility-administered medications for this encounter.    Physical Findings:  vitals were not taken for this visit.   /  Unable to assess due to telephone follow up visit format.   Lab Findings: Lab Results  Component Value Date   WBC 6.9 08/25/2018   HGB 14.4 08/25/2018   HCT 41.7 08/25/2018   MCV 92.0 08/25/2018   PLT 253.0 08/25/2018     Radiographic Findings: No results found.  Impression/Plan: 21. 69 year-old gentleman s/p prostatectomy with extracapsular extension and detectable post-op PSA of 0.101 for Gleason 4+3 prostate cancer.   He will continue to follow up with urology for ongoing PSA determinations and has an appointment scheduled with [name] in March 2021. He understands what to expect with regards to PSA monitoring going forward. I  will look forward to following his response to treatment via correspondence with urology, and would be happy to continue to participate in his care if clinically indicated. I talked to the patient about what to expect in the future, including his risk for erectile dysfunction and rectal bleeding. I encouraged him to call or return to the office if he has any questions regarding his previous radiation or possible radiation side effects. He was comfortable with this plan and will follow up as needed.  A comprehensive survivorship care plan and treatment summary has been mailed to the patient detailing his prostate cancer diagnosis, treatment course, potential late/long-term effects of treatment, appropriate follow-up care with recommendations for the future, and patient education resources.  A copy of this summary, along with a letter will be sent to his primary care provider via In Basket message after today's visit.   #2. Cancer screening:  Due to Mr. Chuba history and his age, he should receive routine screening for skin cancers, colon cancer, and lung cancer.  The information and recommendations are listed on the patient's comprehensive care plan/treatment summary and were reviewed in detail with the patient.      #3. Health maintenance and wellness promotion: Mr. Wohlers was encouraged to consume 5-7 servings of fruits and vegetables per day. Within his survivorship care plan packet, he was provided a copy of the "Nutrition Rainbow" handout, as well as the handout "Take Control of Your Health and [place]" from the [place].  He is also encouraged to engage in moderate to vigorous exercise for 30 minutes per day most days of the week. Information was provided regarding the [facility] fitness program, which is designed for cancer survivors to help them become more physically fit after cancer treatments. We discussed that a healthy BMI is 18.5-24.9 and that maintaining a  healthy weight reduces risk of cancer recurrences.   #4. Support services/counseling: It is not uncommon for this period of the patient's cancer care trajectory to be one of many emotions and stressors.  Mr. Margraf was encouraged to take advantage of our many support services programs, support groups, and/or counseling in coping with his new life as a cancer survivor after completing anti-cancer treatment.  He was offered support today through active listening and expressive supportive counseling.  He was given information regarding our available services and encouraged to contact me with any questions or for help enrolling in any of our support group/programs.       Nicholos Johns, PA-C

## 2019-09-21 ENCOUNTER — Other Ambulatory Visit: Payer: Self-pay | Admitting: Internal Medicine

## 2019-09-21 MED FILL — LOSARTAN POTASSIUM 100 MG T: 100 | 90 days supply | Qty: 90 | Fill #0

## 2019-09-21 MED FILL — DOXAZOSIN MESYLATE 4 MG TAB: 4 | 90 days supply | Qty: 90 | Fill #0

## 2019-09-24 ENCOUNTER — Other Ambulatory Visit: Payer: Self-pay

## 2019-09-24 ENCOUNTER — Other Ambulatory Visit (INDEPENDENT_AMBULATORY_CARE_PROVIDER_SITE_OTHER): Payer: 59

## 2019-09-24 DIAGNOSIS — Z0000 Encounter for general adult medical examination without abnormal findings: Secondary | ICD-10-CM | POA: Diagnosis not present

## 2019-09-24 DIAGNOSIS — E785 Hyperlipidemia, unspecified: Secondary | ICD-10-CM | POA: Diagnosis not present

## 2019-09-24 DIAGNOSIS — E1165 Type 2 diabetes mellitus with hyperglycemia: Secondary | ICD-10-CM

## 2019-09-24 LAB — CBC WITH DIFFERENTIAL/PLATELET
Basophils Absolute: 0.1 10*3/uL (ref 0.0–0.1)
Basophils Relative: 1.2 % (ref 0.0–3.0)
Eosinophils Absolute: 0.2 10*3/uL (ref 0.0–0.7)
Eosinophils Relative: 4.1 % (ref 0.0–5.0)
HCT: 39.1 % (ref 39.0–52.0)
Hemoglobin: 13.3 g/dL (ref 13.0–17.0)
Lymphocytes Relative: 21.2 % (ref 12.0–46.0)
Lymphs Abs: 0.9 10*3/uL (ref 0.7–4.0)
MCHC: 34 g/dL (ref 30.0–36.0)
MCV: 96.3 fl (ref 78.0–100.0)
Monocytes Absolute: 0.5 10*3/uL (ref 0.1–1.0)
Monocytes Relative: 10.8 % (ref 3.0–12.0)
Neutro Abs: 2.8 10*3/uL (ref 1.4–7.7)
Neutrophils Relative %: 62.7 % (ref 43.0–77.0)
Platelets: 170 10*3/uL (ref 150.0–400.0)
RBC: 4.06 Mil/uL — ABNORMAL LOW (ref 4.22–5.81)
RDW: 14.3 % (ref 11.5–15.5)
WBC: 4.4 10*3/uL (ref 4.0–10.5)

## 2019-09-24 LAB — HEPATIC FUNCTION PANEL
ALT: 16 U/L (ref 0–53)
AST: 18 U/L (ref 0–37)
Albumin: 4.4 g/dL (ref 3.5–5.2)
Alkaline Phosphatase: 62 U/L (ref 39–117)
Bilirubin, Direct: 0.2 mg/dL (ref 0.0–0.3)
Total Bilirubin: 0.9 mg/dL (ref 0.2–1.2)
Total Protein: 7 g/dL (ref 6.0–8.3)

## 2019-09-24 LAB — URINALYSIS
Bilirubin Urine: NEGATIVE
Hgb urine dipstick: NEGATIVE
Ketones, ur: NEGATIVE
Leukocytes,Ua: NEGATIVE
Nitrite: NEGATIVE
Specific Gravity, Urine: 1.01 (ref 1.000–1.030)
Total Protein, Urine: NEGATIVE
Urine Glucose: NEGATIVE
Urobilinogen, UA: 0.2 (ref 0.0–1.0)
pH: 5 (ref 5.0–8.0)

## 2019-09-24 LAB — BASIC METABOLIC PANEL
BUN: 17 mg/dL (ref 6–23)
CO2: 34 mEq/L — ABNORMAL HIGH (ref 19–32)
Calcium: 9.9 mg/dL (ref 8.4–10.5)
Chloride: 97 mEq/L (ref 96–112)
Creatinine, Ser: 0.86 mg/dL (ref 0.40–1.50)
GFR: 87.65 mL/min (ref >60.00)
Glucose, Bld: 125 mg/dL — ABNORMAL HIGH (ref 70–99)
Potassium: 3.8 mEq/L (ref 3.5–5.1)
Sodium: 137 mEq/L (ref 135–145)

## 2019-09-24 LAB — LIPID PANEL
Cholesterol: 197 mg/dL (ref 0–200)
HDL: 48.6 mg/dL (ref >39.00)
LDL Cholesterol: 127 mg/dL — ABNORMAL HIGH (ref 0–99)
NonHDL: 148.34
Total CHOL/HDL Ratio: 4
Triglycerides: 105 mg/dL (ref 0.0–149.0)
VLDL: 21 mg/dL (ref 0.0–40.0)

## 2019-09-24 LAB — MICROALBUMIN / CREATININE URINE RATIO
Creatinine,U: 27.1 mg/dL
Microalb Creat Ratio: 26.7 mg/g (ref 0.0–30.0)
Microalb, Ur: 7.2 mg/dL — ABNORMAL HIGH (ref 0.0–1.9)

## 2019-09-24 LAB — HEMOGLOBIN A1C: Hgb A1c MFr Bld: 7 % — ABNORMAL HIGH (ref 4.6–6.5)

## 2019-09-24 LAB — PSA: PSA: 0.11 ng/mL (ref 0.10–4.00)

## 2019-09-24 LAB — TSH: TSH: 0.69 u[IU]/mL (ref 0.35–4.50)

## 2019-09-29 ENCOUNTER — Ambulatory Visit (INDEPENDENT_AMBULATORY_CARE_PROVIDER_SITE_OTHER): Payer: 59 | Admitting: Internal Medicine

## 2019-09-29 ENCOUNTER — Other Ambulatory Visit: Payer: Self-pay

## 2019-09-29 ENCOUNTER — Encounter: Payer: Self-pay | Admitting: Internal Medicine

## 2019-09-29 DIAGNOSIS — R9721 Rising PSA following treatment for malignant neoplasm of prostate: Secondary | ICD-10-CM

## 2019-09-29 DIAGNOSIS — E1165 Type 2 diabetes mellitus with hyperglycemia: Secondary | ICD-10-CM

## 2019-09-29 DIAGNOSIS — C61 Malignant neoplasm of prostate: Secondary | ICD-10-CM

## 2019-09-29 DIAGNOSIS — Z0000 Encounter for general adult medical examination without abnormal findings: Secondary | ICD-10-CM

## 2019-09-29 DIAGNOSIS — E785 Hyperlipidemia, unspecified: Secondary | ICD-10-CM

## 2019-09-29 DIAGNOSIS — I2510 Atherosclerotic heart disease of native coronary artery without angina pectoris: Secondary | ICD-10-CM

## 2019-09-29 DIAGNOSIS — I10 Essential (primary) hypertension: Secondary | ICD-10-CM

## 2019-09-29 NOTE — Assessment & Plan Note (Signed)
REPATHA injections adviced

## 2019-09-29 NOTE — Progress Notes (Signed)
Subjective:  Patient ID: Rodney Hayes, male    DOB: 06-Feb-1951  Age: 69 y.o. MRN: [id number]  CC: No chief complaint on file.   HPI Rodney Hayes presents for a well exam  Outpatient Medications Prior to Visit  Medication Sig Dispense Refill  . amLODipine (NORVASC) 10 MG tablet TAKE 1/2 TABLET BY MOUTH EVERY MORNING. ANNUAL APPT DUE IN [place]. 45 tablet 3  . Cholecalciferol (VITAMIN D3) 50 MCG (2000 UT) capsule Take 1 capsule (2,000 Units total) by mouth daily. 100 capsule 3  . doxazosin (CARDURA) 4 MG tablet TAKE 1 TABLET (4 MG TOTAL) BY MOUTH DAILY. 90 tablet 3  . glucose blood (ACCU-CHEK GUIDE) test strip Use to check blood sugar 4 times a day 400 each 12  . hydrochlorothiazide (HYDRODIURIL) 25 MG tablet TAKE 1 TABLET (25 MG TOTAL) BY MOUTH DAILY. 90 tablet 0  . Icosapent Ethyl (VASCEPA) 1 g CAPS Take 2 capsules (2 g total) by mouth 2 (two) times daily. 120 capsule 11  . Lancets Misc. (ACCU-CHEK FASTCLIX LANCET) KIT Use as directed 1 kit 5  . losartan (COZAAR) 100 MG tablet TAKE 1 TABLET BY MOUTH DAILY. 90 tablet 3  . naproxen sodium (ALEVE) 220 MG tablet Take 220 mg by mouth.    . psyllium (METAMUCIL) 58.6 % packet Take 1 packet by mouth daily.    . Oxycodone HCl 10 MG TABS Take 1 tablet (10 mg total) by mouth every 6 (six) hours as needed. (Patient not taking: Reported on 09/29/2019) 25 tablet 0  . UNIFINE PENTIPS 31G X 5 MM MISC USE FOR VICTOZA DAILY AS DIRECTED (Patient not taking: Reported on 04/06/2019) 100 each PRN   No facility-administered medications prior to visit.    ROS: Review of Systems  Constitutional: Positive for unexpected weight change. Negative for appetite change and fatigue.  HENT: Negative for congestion, nosebleeds, sneezing, sore throat and trouble swallowing.   Eyes: Negative for itching and visual disturbance.  Respiratory: Negative for cough.   Cardiovascular: Negative for chest pain, palpitations and leg swelling.  Gastrointestinal: Negative for  abdominal distention, blood in stool, diarrhea and nausea.  Genitourinary: Negative for frequency and hematuria.  Musculoskeletal: Positive for arthralgias and gait problem. Negative for back pain, joint swelling and neck pain.  Skin: Negative for rash.  Neurological: Negative for dizziness, tremors, speech difficulty and weakness.  Psychiatric/Behavioral: Negative for agitation, behavioral problems, dysphoric mood, sleep disturbance and suicidal ideas. The patient is not nervous/anxious.     Objective:  BP (!) 142/86 (BP Location: Left Arm, Patient Position: Sitting, Cuff Size: Large)   Pulse 79   Temp 98.3 F (36.8 C) (Oral)   Ht '5\' 11"'  (1.803 m)   Wt (!) 303 lb 2 oz (137.5 kg)   SpO2 96%   BMI 42.28 kg/m   BP Readings from Last 3 Encounters:  09/29/19 (!) 142/86  04/06/19 (!) 163/72  12/30/18 (!) 147/78    Wt Readings from Last 3 Encounters:  09/29/19 (!) 303 lb 2 oz (137.5 kg)  04/06/19 (!) 305 lb (138.3 kg)  12/30/18 290 lb (131.5 kg)    Physical Exam Constitutional:      General: He is not in acute distress.    Appearance: He is well-developed. He is obese.     Comments: NAD  Eyes:     Conjunctiva/sclera: Conjunctivae normal.     Pupils: Pupils are equal, round, and reactive to light.  Neck:     Thyroid: No thyromegaly.  Vascular: No JVD.  Cardiovascular:     Rate and Rhythm: Normal rate and regular rhythm.     Heart sounds: Normal heart sounds. No murmur. No friction rub. No gallop.   Pulmonary:     Effort: Pulmonary effort is normal. No respiratory distress.     Breath sounds: Normal breath sounds. No wheezing or rales.  Chest:     Chest wall: No tenderness.  Abdominal:     General: Bowel sounds are normal. There is no distension.     Palpations: Abdomen is soft. There is no mass.     Tenderness: There is no abdominal tenderness. There is no guarding or rebound.  Musculoskeletal:        General: No tenderness. Normal range of motion.     Cervical  back: Normal range of motion.  Lymphadenopathy:     Cervical: No cervical adenopathy.  Skin:    General: Skin is warm and dry.     Findings: No rash.  Neurological:     Mental Status: He is alert and oriented to person, place, and time.     Cranial Nerves: No cranial nerve deficit.     Motor: No abnormal muscle tone.     Coordination: Coordination normal.     Gait: Gait normal.     Deep Tendon Reflexes: Reflexes are normal and symmetric.  Psychiatric:        Behavior: Behavior normal.        Thought Content: Thought content normal.        Judgment: Judgment normal.   Rectal-patient declined  Lab Results  Component Value Date   WBC 4.4 09/24/2019   HGB 13.3 09/24/2019   HCT 39.1 09/24/2019   PLT 170.0 09/24/2019   GLUCOSE 125 (H) 09/24/2019   CHOL 197 09/24/2019   TRIG 105.0 09/24/2019   HDL 48.60 09/24/2019   LDLDIRECT 89.0 08/16/2016   LDLCALC 127 (H) 09/24/2019   ALT 16 09/24/2019   AST 18 09/24/2019   NA 137 09/24/2019   K 3.8 09/24/2019   CL 97 09/24/2019   CREATININE 0.86 09/24/2019   BUN 17 09/24/2019   CO2 34 (H) 09/24/2019   TSH 0.69 09/24/2019   PSA 0.11 09/24/2019   INR 0.91 01/06/2013   HGBA1C 7.0 (H) 09/24/2019   MICROALBUR 7.2 (H) 09/24/2019    No results found.  Assessment & Plan:    Rodney Kehr, MD

## 2019-09-29 NOTE — Assessment & Plan Note (Signed)
XRT 

## 2019-09-29 NOTE — Patient Instructions (Addendum)
Wt Readings from Last 3 Encounters:  09/29/19 (!) 303 lb 2 oz (137.5 kg)  04/06/19 (!) 305 lb (138.3 kg)  12/30/18 290 lb (131.5 kg)    BP Readings from Last 3 Encounters:  09/29/19 (!) 142/86  04/06/19 (!) 163/72  12/30/18 (!) 147/78    REPATHA injections  Think about getting a Carotid doppler US

## 2019-10-05 ENCOUNTER — Other Ambulatory Visit: Payer: Self-pay | Admitting: Internal Medicine

## 2019-10-05 ENCOUNTER — Encounter: Payer: Self-pay | Admitting: Internal Medicine

## 2019-10-05 DIAGNOSIS — G4733 Obstructive sleep apnea (adult) (pediatric): Secondary | ICD-10-CM | POA: Diagnosis not present

## 2019-10-05 MED FILL — VASCEPA 1 GM CAPSULE: 1 | 30 days supply | Qty: 120 | Fill #0

## 2019-10-05 MED FILL — PENICILLIN VK 500 MG TABLET: 500 | 7 days supply | Qty: 28 | Fill #0

## 2019-10-05 NOTE — Assessment & Plan Note (Signed)
Cardura, HCTZ, losartan, amlodipine

## 2019-10-05 NOTE — Assessment & Plan Note (Signed)
Rodney Hayes is going to improve his diet

## 2019-10-05 NOTE — Assessment & Plan Note (Addendum)
Suggested Repatha.  He will discuss with cardiology Carotid Doppler ultrasound suggested

## 2019-10-05 NOTE — Assessment & Plan Note (Addendum)
  We discussed age appropriate health related issues, including available/recomended screening tests and vaccinations. Labs were ordered to be later reviewed . All questions were answered. We discussed one or more of the following - seat belt use, use of sunscreen/sun exposure exercise, safe sex, fall risk reduction, second hand smoke exposure, firearm use and storage, seat belt use, a need for adhering to healthy diet and exercise. Labs were discussed. All questions were answered. Carotid Doppler ultrasound suggested Colonoscopy is due in 2020 [name]

## 2019-10-05 NOTE — Assessment & Plan Note (Signed)
Rodney Hayes is on a low-carb diet/fasting usually doing very well

## 2019-10-05 NOTE — Assessment & Plan Note (Signed)
Follow-up with urology-[name]

## 2019-10-08 DIAGNOSIS — C61 Malignant neoplasm of prostate: Secondary | ICD-10-CM | POA: Diagnosis not present

## 2019-10-08 DIAGNOSIS — N5231 Erectile dysfunction following radical prostatectomy: Secondary | ICD-10-CM | POA: Diagnosis not present

## 2019-10-08 MED FILL — SILDENAFIL CITRATE 100 MG T: 100 | 30 days supply | Qty: 6 | Fill #0

## 2019-10-26 MED FILL — PENICILLIN VK 500 MG TABLET: 500 | 7 days supply | Qty: 28 | Fill #0

## 2019-11-11 ENCOUNTER — Other Ambulatory Visit: Payer: Self-pay | Admitting: Internal Medicine

## 2019-11-12 MED FILL — AMLODIPINE BESYLATE 10 MG T: 10 | 90 days supply | Qty: 45 | Fill #0

## 2019-11-12 MED FILL — HYDROCHLOROTHIAZIDE 25 MG T: 25 | 90 days supply | Qty: 90 | Fill #0

## 2019-11-27 ENCOUNTER — Other Ambulatory Visit: Payer: Self-pay | Admitting: Pulmonary Disease

## 2019-11-27 ENCOUNTER — Encounter: Payer: Self-pay | Admitting: Pulmonary Disease

## 2019-11-27 MED ORDER — AMOXICILLIN-POT CLAVULANATE 875-125 MG PO TABS
1.0000 | ORAL_TABLET | Freq: Two times a day (BID) | ORAL | 0 refills | Status: DC
Start: 2019-11-27 — End: 2021-03-29

## 2019-11-27 MED ORDER — HYDROCORTISONE-ACETIC ACID 1-2 % OT SOLN: 3.0000 [drp] | Freq: Four times a day (QID) | OTIC | 0 refills | Status: DC

## 2019-11-27 NOTE — Progress Notes (Signed)
C/o right ear pain x two days.  No fever.  Feels like ear is full, but no drainage.  Right ear canal visualized.  No exudate, TM clear.  Will send script for acetic acid and hydrocortisone ear drops 3 drops every 6 hours, and augmentin.  Chesley Mires, MD [facility] Pager - [phone] 11/27/2019, 8:33 AM

## 2020-01-03 MED FILL — CLINDAMYCIN HCL 150 MG CAPS: 150 | 5 days supply | Qty: 30 | Fill #0

## 2020-01-07 DIAGNOSIS — G4733 Obstructive sleep apnea (adult) (pediatric): Secondary | ICD-10-CM | POA: Diagnosis not present

## 2020-01-11 MED FILL — PENICILLIN VK 500 MG TABLET: 500 | 7 days supply | Qty: 20 | Fill #0

## 2020-01-11 MED FILL — CHLORHEXIDINE 0.12% RINSE: 0.12 | 17 days supply | Qty: 473 | Fill #0

## 2020-01-11 MED FILL — DEXAMETHASONE 4 MG TABLET: 4 | 2 days supply | Qty: 6 | Fill #0

## 2020-01-11 MED FILL — HYDROCODON-APAP 5-325: 5-325 | 2 days supply | Qty: 5 | Fill #0

## 2020-02-01 MED FILL — AMLODIPINE BESYLATE 10 MG T: 10 | 90 days supply | Qty: 45 | Fill #1

## 2020-02-01 MED FILL — VASCEPA 1 GM CAPSULE: 1 | 30 days supply | Qty: 120 | Fill #1

## 2020-03-21 MED FILL — HYDROCHLOROTHIAZIDE 25 MG T: 25 | 90 days supply | Qty: 90 | Fill #1

## 2020-03-21 MED FILL — LOSARTAN POTASSIUM 100 MG T: 100 | 90 days supply | Qty: 90 | Fill #2

## 2020-03-21 MED FILL — DOXAZOSIN MESYLATE 4 MG TAB: 4 | 90 days supply | Qty: 90 | Fill #2

## 2020-04-05 ENCOUNTER — Other Ambulatory Visit: Payer: Self-pay | Admitting: Internal Medicine

## 2020-04-12 ENCOUNTER — Other Ambulatory Visit: Payer: Self-pay | Admitting: Internal Medicine

## 2020-04-12 MED ORDER — FREESTYLE SYSTEM KIT: 1.0000 | PACK | 1 refills | Status: DC | PRN

## 2020-04-12 MED ORDER — FREESTYLE SYSTEM KIT: 1.0000 | PACK | 1 refills | Status: AC | PRN

## 2020-04-12 MED ORDER — FREESTYLE TEST VI STRP
ORAL_STRIP | 12 refills | Status: DC
Start: 2020-04-12 — End: 2020-04-12

## 2020-04-12 MED ORDER — FREESTYLE LANCETS MISC: 12 refills | Status: AC

## 2020-04-12 MED ORDER — GLUCOSE BLOOD VI STRP
ORAL_STRIP | 12 refills | Status: DC
  Filled 2020-11-13: qty 200, 50d supply, fill #0

## 2020-04-12 MED FILL — FREESTYLE LITE METER: W/DEVICE | 1 days supply | Qty: 1 | Fill #0

## 2020-04-12 MED FILL — FREESTYLE LANCETS: 50 days supply | Qty: 200 | Fill #0

## 2020-04-12 MED FILL — FREESTYLE LITE TEST STRIP: 50 days supply | Qty: 200 | Fill #0

## 2020-04-12 NOTE — Addendum Note (Signed)
Addended by: Darlys Gales on: 04/12/2020 12:34 PM   Modules accepted: Orders

## 2020-05-03 MED FILL — AMLODIPINE BESYLATE 10 MG T: 10 | 90 days supply | Qty: 45 | Fill #2

## 2020-05-10 DIAGNOSIS — C61 Malignant neoplasm of prostate: Secondary | ICD-10-CM | POA: Diagnosis not present

## 2020-05-17 DIAGNOSIS — N5231 Erectile dysfunction following radical prostatectomy: Secondary | ICD-10-CM | POA: Diagnosis not present

## 2020-05-17 DIAGNOSIS — N393 Stress incontinence (female) (male): Secondary | ICD-10-CM | POA: Diagnosis not present

## 2020-05-17 DIAGNOSIS — C61 Malignant neoplasm of prostate: Secondary | ICD-10-CM | POA: Diagnosis not present

## 2020-06-27 DIAGNOSIS — G4733 Obstructive sleep apnea (adult) (pediatric): Secondary | ICD-10-CM | POA: Diagnosis not present

## 2020-07-06 MED FILL — DOXAZOSIN MESYLATE 4 MG TAB: 4 | 90 days supply | Qty: 90 | Fill #3

## 2020-07-06 MED FILL — VASCEPA 1 GM CAPSULE: 1 | 30 days supply | Qty: 120 | Fill #2

## 2020-07-10 MED FILL — LOSARTAN POTASSIUM 100 MG T: 100 | 90 days supply | Qty: 90 | Fill #3

## 2020-07-24 ENCOUNTER — Other Ambulatory Visit (HOSPITAL_COMMUNITY): Payer: Self-pay | Admitting: Oral and Maxillofacial Surgery

## 2020-07-24 MED FILL — AMOXICILLIN 500 MG CAPSULE: 500 | 7 days supply | Qty: 21 | Fill #0

## 2020-07-24 MED FILL — HYDROCODON-APAP 5-325: 5-325 | 2 days supply | Qty: 5 | Fill #0

## 2020-07-24 MED FILL — DEXAMETHASONE 4 MG TABLET: 4 | 2 days supply | Qty: 6 | Fill #0

## 2020-07-24 MED FILL — CHLORHEXIDINE 0.12% RINSE: 0.12 | 17 days supply | Qty: 473 | Fill #0

## 2020-07-31 MED FILL — AMLODIPINE BESYLATE 10 MG T: 10 | 90 days supply | Qty: 45 | Fill #3

## 2020-08-14 MED FILL — HYDROCHLOROTHIAZIDE 25 MG T: 25 | 90 days supply | Qty: 90 | Fill #2

## 2020-08-15 ENCOUNTER — Other Ambulatory Visit (HOSPITAL_COMMUNITY): Payer: Self-pay | Admitting: Dentistry

## 2020-08-15 MED FILL — AMOX-CLAV 875-125 MG TABLET: 875-125 | 7 days supply | Qty: 14 | Fill #0

## 2020-08-31 DIAGNOSIS — H2513 Age-related nuclear cataract, bilateral: Secondary | ICD-10-CM | POA: Diagnosis not present

## 2020-08-31 DIAGNOSIS — H35033 Hypertensive retinopathy, bilateral: Secondary | ICD-10-CM | POA: Diagnosis not present

## 2020-08-31 DIAGNOSIS — H25013 Cortical age-related cataract, bilateral: Secondary | ICD-10-CM | POA: Diagnosis not present

## 2020-08-31 DIAGNOSIS — E119 Type 2 diabetes mellitus without complications: Secondary | ICD-10-CM | POA: Diagnosis not present

## 2020-08-31 LAB — HM DIABETES EYE EXAM

## 2020-09-04 ENCOUNTER — Encounter: Payer: Self-pay | Admitting: Internal Medicine

## 2020-09-11 ENCOUNTER — Other Ambulatory Visit (HOSPITAL_COMMUNITY): Payer: Self-pay | Admitting: Internal Medicine

## 2020-09-11 ENCOUNTER — Ambulatory Visit: Payer: 59 | Attending: Internal Medicine

## 2020-09-11 DIAGNOSIS — Z23 Encounter for immunization: Secondary | ICD-10-CM

## 2020-09-11 MED FILL — PFIZER-BIONT COVID-19 VAC-T: 30 | 21 days supply | Qty: 0 | Fill #0

## 2020-09-11 NOTE — Progress Notes (Signed)
   Covid-19 Vaccination Clinic  Name:  Rodney Hayes    MRN: [id number] DOB: Dec 09, 1950  09/11/2020  Rodney Hayes was observed post Covid-19 immunization for 15 minutes without incident. He was provided with Vaccine Information Sheet and instruction to access the V-Safe system.   Rodney Hayes was instructed to call 911 with any severe reactions post vaccine: Marland Kitchen Difficulty breathing  . Swelling of face and throat  . A fast heartbeat  . A bad rash all over body  . Dizziness and weakness   Immunizations Administered    Name Date Dose VIS Date Route   PFIZER Comrnaty(Gray TOP) Covid-19 Vaccine 09/11/2020  9:46 AM 0.3 mL 06/22/2020 Intramuscular   Manufacturer: [place]   Lot: [id number]   NDC: [phone]

## 2020-10-03 DIAGNOSIS — C61 Malignant neoplasm of prostate: Secondary | ICD-10-CM | POA: Diagnosis not present

## 2020-10-11 ENCOUNTER — Other Ambulatory Visit: Payer: Self-pay | Admitting: Internal Medicine

## 2020-10-11 DIAGNOSIS — C61 Malignant neoplasm of prostate: Secondary | ICD-10-CM | POA: Diagnosis not present

## 2020-10-11 DIAGNOSIS — N5231 Erectile dysfunction following radical prostatectomy: Secondary | ICD-10-CM | POA: Diagnosis not present

## 2020-10-11 DIAGNOSIS — N3941 Urge incontinence: Secondary | ICD-10-CM | POA: Diagnosis not present

## 2020-10-11 MED FILL — LOSARTAN POTASSIUM 100 MG T: 100 | 30 days supply | Qty: 30 | Fill #0

## 2020-10-25 ENCOUNTER — Other Ambulatory Visit (HOSPITAL_COMMUNITY): Payer: Self-pay

## 2020-10-25 ENCOUNTER — Other Ambulatory Visit: Payer: Self-pay | Admitting: Internal Medicine

## 2020-10-25 MED ORDER — AMOXICILLIN-POT CLAVULANATE 875-125 MG PO TABS
1.0000 | ORAL_TABLET | Freq: Two times a day (BID) | ORAL | 0 refills | Status: DC
  Filled 2020-10-25: qty 14, 7d supply, fill #0

## 2020-10-26 ENCOUNTER — Other Ambulatory Visit (HOSPITAL_COMMUNITY): Payer: Self-pay

## 2020-10-26 MED ORDER — AMLODIPINE BESYLATE 10 MG PO TABS
5.0000 mg | ORAL_TABLET | Freq: Every day | ORAL | 0 refills | Status: DC
  Filled 2020-10-26: qty 15, 30d supply, fill #0

## 2020-10-27 ENCOUNTER — Other Ambulatory Visit (HOSPITAL_COMMUNITY): Payer: Self-pay

## 2020-10-28 DIAGNOSIS — Z20822 Contact with and (suspected) exposure to covid-19: Secondary | ICD-10-CM | POA: Diagnosis not present

## 2020-11-13 ENCOUNTER — Other Ambulatory Visit (HOSPITAL_COMMUNITY): Payer: Self-pay

## 2020-11-13 MED FILL — Losartan Potassium Tab 100 MG: ORAL | 90 days supply | Qty: 90 | Fill #0 | Status: AC

## 2020-12-04 DIAGNOSIS — G4733 Obstructive sleep apnea (adult) (pediatric): Secondary | ICD-10-CM | POA: Diagnosis not present

## 2020-12-18 ENCOUNTER — Other Ambulatory Visit (HOSPITAL_COMMUNITY): Payer: Self-pay

## 2020-12-18 ENCOUNTER — Other Ambulatory Visit: Payer: Self-pay | Admitting: Internal Medicine

## 2020-12-18 MED ORDER — AMLODIPINE BESYLATE 10 MG PO TABS
5.0000 mg | ORAL_TABLET | Freq: Every day | ORAL | 0 refills | Status: DC
  Filled 2020-12-18: qty 45, 90d supply, fill #0

## 2021-01-22 ENCOUNTER — Other Ambulatory Visit (HOSPITAL_COMMUNITY): Payer: Self-pay

## 2021-03-06 ENCOUNTER — Telehealth: Payer: Self-pay | Admitting: Internal Medicine

## 2021-03-06 DIAGNOSIS — E785 Hyperlipidemia, unspecified: Secondary | ICD-10-CM

## 2021-03-06 DIAGNOSIS — E1165 Type 2 diabetes mellitus with hyperglycemia: Secondary | ICD-10-CM

## 2021-03-06 DIAGNOSIS — Z0000 Encounter for general adult medical examination without abnormal findings: Secondary | ICD-10-CM

## 2021-03-06 NOTE — Telephone Encounter (Signed)
Scheduled for physical on 03/29/2021. Requesting labs to sent in prior to appt.   Please advise

## 2021-03-06 NOTE — Telephone Encounter (Signed)
Okay.  Thanks.

## 2021-03-07 NOTE — Telephone Encounter (Signed)
Notified pt MD placed orders will go to [facility] to have done 2-3 days prior to appt.Marland KitchenChryl Heck

## 2021-03-26 ENCOUNTER — Other Ambulatory Visit (INDEPENDENT_AMBULATORY_CARE_PROVIDER_SITE_OTHER): Payer: 59

## 2021-03-26 DIAGNOSIS — E1165 Type 2 diabetes mellitus with hyperglycemia: Secondary | ICD-10-CM

## 2021-03-26 DIAGNOSIS — Z125 Encounter for screening for malignant neoplasm of prostate: Secondary | ICD-10-CM | POA: Diagnosis not present

## 2021-03-26 DIAGNOSIS — Z0000 Encounter for general adult medical examination without abnormal findings: Secondary | ICD-10-CM | POA: Diagnosis not present

## 2021-03-26 DIAGNOSIS — E785 Hyperlipidemia, unspecified: Secondary | ICD-10-CM

## 2021-03-26 LAB — TSH: TSH: 0.98 u[IU]/mL (ref 0.35–5.50)

## 2021-03-26 LAB — CBC WITH DIFFERENTIAL/PLATELET
Basophils Absolute: 0 10*3/uL (ref 0.0–0.1)
Basophils Relative: 0.8 % (ref 0.0–3.0)
Eosinophils Absolute: 0.1 10*3/uL (ref 0.0–0.7)
Eosinophils Relative: 2.8 % (ref 0.0–5.0)
HCT: 40.3 % (ref 39.0–52.0)
Hemoglobin: 13.9 g/dL (ref 13.0–17.0)
Lymphocytes Relative: 22.1 % (ref 12.0–46.0)
Lymphs Abs: 1 10*3/uL (ref 0.7–4.0)
MCHC: 34.4 g/dL (ref 30.0–36.0)
MCV: 92.4 fl (ref 78.0–100.0)
Monocytes Absolute: 0.4 10*3/uL (ref 0.1–1.0)
Monocytes Relative: 8.2 % (ref 3.0–12.0)
Neutro Abs: 3 10*3/uL (ref 1.4–7.7)
Neutrophils Relative %: 66.1 % (ref 43.0–77.0)
Platelets: 197 10*3/uL (ref 150.0–400.0)
RBC: 4.36 Mil/uL (ref 4.22–5.81)
RDW: 13.4 % (ref 11.5–15.5)
WBC: 4.5 10*3/uL (ref 4.0–10.5)

## 2021-03-26 LAB — URINALYSIS, ROUTINE W REFLEX MICROSCOPIC
Bilirubin Urine: NEGATIVE
Leukocytes,Ua: NEGATIVE
Nitrite: NEGATIVE
Specific Gravity, Urine: 1.025 (ref 1.000–1.030)
Total Protein, Urine: 100 — AB
Urine Glucose: >=1000 — AB
Urobilinogen, UA: 0.2 (ref 0.0–1.0)
pH: 6 (ref 5.0–8.0)

## 2021-03-26 LAB — MICROALBUMIN / CREATININE URINE RATIO
Creatinine,U: 54.3 mg/dL
Microalb Creat Ratio: 266.6 mg/g — ABNORMAL HIGH (ref 0.0–30.0)
Microalb, Ur: 144.8 mg/dL — ABNORMAL HIGH (ref 0.0–1.9)

## 2021-03-26 LAB — COMPREHENSIVE METABOLIC PANEL
ALT: 19 U/L (ref 0–53)
AST: 15 U/L (ref 0–37)
Albumin: 4.1 g/dL (ref 3.5–5.2)
Alkaline Phosphatase: 68 U/L (ref 39–117)
BUN: 11 mg/dL (ref 6–23)
CO2: 30 mEq/L (ref 19–32)
Calcium: 9.4 mg/dL (ref 8.4–10.5)
Chloride: 97 mEq/L (ref 96–112)
Creatinine, Ser: 0.78 mg/dL (ref 0.40–1.50)
GFR: 89.07 mL/min (ref >60.00)
Glucose, Bld: 303 mg/dL — ABNORMAL HIGH (ref 70–99)
Potassium: 4.2 mEq/L (ref 3.5–5.1)
Sodium: 137 mEq/L (ref 135–145)
Total Bilirubin: 0.9 mg/dL (ref 0.2–1.2)
Total Protein: 6.4 g/dL (ref 6.0–8.3)

## 2021-03-26 LAB — LIPID PANEL
Cholesterol: 220 mg/dL — ABNORMAL HIGH (ref 0–200)
HDL: 53.9 mg/dL (ref >39.00)
LDL Cholesterol: 139 mg/dL — ABNORMAL HIGH (ref 0–99)
NonHDL: 166.04
Total CHOL/HDL Ratio: 4
Triglycerides: 133 mg/dL (ref 0.0–149.0)
VLDL: 26.6 mg/dL (ref 0.0–40.0)

## 2021-03-26 LAB — PSA: PSA: 0 ng/mL — ABNORMAL LOW (ref 0.10–4.00)

## 2021-03-26 LAB — HEMOGLOBIN A1C: Hgb A1c MFr Bld: 12.9 % — ABNORMAL HIGH (ref 4.6–6.5)

## 2021-03-29 ENCOUNTER — Encounter: Payer: Self-pay | Admitting: Internal Medicine

## 2021-03-29 ENCOUNTER — Other Ambulatory Visit: Payer: Self-pay

## 2021-03-29 ENCOUNTER — Other Ambulatory Visit (HOSPITAL_COMMUNITY): Payer: Self-pay

## 2021-03-29 ENCOUNTER — Ambulatory Visit (INDEPENDENT_AMBULATORY_CARE_PROVIDER_SITE_OTHER): Payer: 59 | Admitting: Internal Medicine

## 2021-03-29 VITALS — BP 142/84 | HR 92 | Temp 98.1°F | Ht 71.0 in | Wt 269.0 lb

## 2021-03-29 DIAGNOSIS — Z1211 Encounter for screening for malignant neoplasm of colon: Secondary | ICD-10-CM | POA: Diagnosis not present

## 2021-03-29 DIAGNOSIS — Z0000 Encounter for general adult medical examination without abnormal findings: Secondary | ICD-10-CM | POA: Diagnosis not present

## 2021-03-29 DIAGNOSIS — Z23 Encounter for immunization: Secondary | ICD-10-CM

## 2021-03-29 DIAGNOSIS — E1165 Type 2 diabetes mellitus with hyperglycemia: Secondary | ICD-10-CM

## 2021-03-29 MED ORDER — HYDROCHLOROTHIAZIDE 25 MG PO TABS
25.0000 mg | ORAL_TABLET | Freq: Every day | ORAL | 3 refills | Status: DC
  Filled 2021-03-29: qty 90, 90d supply, fill #0
  Filled 2021-10-01: qty 90, 90d supply, fill #1

## 2021-03-29 MED ORDER — ICOSAPENT ETHYL 1 G PO CAPS
2.0000 g | ORAL_CAPSULE | Freq: Two times a day (BID) | ORAL | 3 refills | Status: DC
  Filled 2021-03-29: qty 360, 90d supply, fill #0

## 2021-03-29 MED ORDER — LOSARTAN POTASSIUM 100 MG PO TABS
100.0000 mg | ORAL_TABLET | Freq: Every day | ORAL | 3 refills | Status: DC
  Filled 2021-03-29: qty 90, 90d supply, fill #0
  Filled 2021-08-24: qty 90, 90d supply, fill #1
  Filled 2021-11-24: qty 90, 90d supply, fill #2
  Filled 2022-03-02: qty 90, 90d supply, fill #3

## 2021-03-29 MED ORDER — AMLODIPINE BESYLATE 5 MG PO TABS
5.0000 mg | ORAL_TABLET | Freq: Every day | ORAL | 3 refills | Status: DC
  Filled 2021-03-29: qty 90, 90d supply, fill #0
  Filled 2021-07-10: qty 90, 90d supply, fill #1
  Filled 2021-10-01: qty 90, 90d supply, fill #2
  Filled 2021-12-30: qty 90, 90d supply, fill #3

## 2021-03-29 NOTE — Patient Instructions (Signed)
Wt Readings from Last 3 Encounters:  03/29/21 269 lb (122 kg)  09/29/19 (!) 303 lb 2 oz (137.5 kg)  04/06/19 (!) 305 lb (138.3 kg)

## 2021-03-29 NOTE — Assessment & Plan Note (Addendum)
Rodney Hayes reports intolerance to multiple diabetic meds.  He is not interested in starting any pharmaceutical therapy. [name] re-started intermittent fasting and his blood sugars are better already.  He is losing weight.  He has been physically active.  We will recheck labs in 3 months

## 2021-03-29 NOTE — Progress Notes (Signed)
Subjective:  Patient ID: Rodney Hayes, male    DOB: 1950-11-22  Age: 70 y.o. MRN: [id number]  CC: Annual Exam   HPI Rodney Hayes presents for a well exam F/u on DM   Outpatient Medications Prior to Visit  Medication Sig Dispense Refill   chlorhexidine (PERIDEX) 0.12 % solution RINSE MOUTH WITH 15ML (1 CAPFUL) FOR 30 SECONDS AM AND PM AFTER TOOTHBRUSHING. EXPECTORATE AFTER RINSING, DO NOT SWALLOW 473 mL 0   Cholecalciferol (VITAMIN D3) 50 MCG (2000 UT) capsule Take 1 capsule (2,000 Units total) by mouth daily. 100 capsule 3   glucose blood test strip Use as directed four times daily 200 each 12   glucose monitoring kit (FREESTYLE) monitoring kit 1 each by Does not apply route as needed for other. 1 each 1   Lancets (FREESTYLE) lancets Use as instructed qid 200 each 12   Lancets Misc. (ACCU-CHEK FASTCLIX LANCET) KIT Use as directed 1 kit 5   psyllium (METAMUCIL) 58.6 % packet Take 1 packet by mouth daily.     amLODipine (NORVASC) 10 MG tablet Take 1/2 tablet (5 mg total) by mouth daily. Overdue for Annual appt must see provider for future refills 90 tablet 0   COVID-19 mRNA Vac-TriS, Pfizer, SUSP injection INJECT AS DIRECTED .3 mL 0   losartan (COZAAR) 100 MG tablet TAKE 1 TABLET BY MOUTH DAILY. 90 tablet 3   acetic acid-hydrocortisone (VOSOL-HC) OTIC solution Place 3 drops into the right ear 4 (four) times daily. 10 mL 0   amoxicillin (AMOXIL) 500 MG capsule TAKE 1 CAPSULE BY MOUTH 3 TIMES DAILY UNTIL GONE. 21 capsule 0   amoxicillin-clavulanate (AUGMENTIN) 875-125 MG tablet Take 1 tablet by mouth 2 (two) times daily. 20 tablet 0   amoxicillin-clavulanate (AUGMENTIN) 875-125 MG tablet TAKE 1 TABLET BY MOUTH 2 TIMES DAILY UNTIL GONE 14 tablet 0   amoxicillin-clavulanate (AUGMENTIN) 875-125 MG tablet Take 1 tablet by mouth 2 (two) times daily until gone 14 tablet 0   dexamethasone (DECADRON) 4 MG tablet TAKE 1 TABLET BY MOUTH 3 TIMES A DAY 6 tablet 0   doxazosin (CARDURA) 4 MG tablet  TAKE 1 TABLET (4 MG TOTAL) BY MOUTH DAILY. 90 tablet 3   hydrochlorothiazide (HYDRODIURIL) 25 MG tablet TAKE 1 TABLET (25 MG TOTAL) BY MOUTH DAILY. 90 tablet 3   naproxen sodium (ALEVE) 220 MG tablet Take 220 mg by mouth.     Oxycodone HCl 10 MG TABS Take 1 tablet (10 mg total) by mouth every 6 (six) hours as needed. (Patient not taking: Reported on 09/29/2019) 25 tablet 0   UNIFINE PENTIPS 31G X 5 MM MISC USE FOR VICTOZA DAILY AS DIRECTED (Patient not taking: Reported on 04/06/2019) 100 each PRN   VASCEPA 1 g capsule TAKE 2 CAPSULES BY MOUTH TWICE A DAY 120 capsule 11   No facility-administered medications prior to visit.    ROS: Review of Systems  Constitutional:  Positive for unexpected weight change. Negative for appetite change and fatigue.  HENT:  Negative for congestion, nosebleeds, sneezing, sore throat and trouble swallowing.   Eyes:  Negative for itching and visual disturbance.  Respiratory:  Negative for cough.   Cardiovascular:  Negative for chest pain, palpitations and leg swelling.  Gastrointestinal:  Negative for abdominal distention, blood in stool, diarrhea and nausea.  Genitourinary:  Negative for frequency and hematuria.  Musculoskeletal:  Positive for arthralgias. Negative for back pain, gait problem, joint swelling and neck pain.  Skin:  Negative for rash.  Neurological:  Negative for dizziness, tremors, speech difficulty and weakness.  Psychiatric/Behavioral:  Negative for agitation, dysphoric mood, sleep disturbance and suicidal ideas. The patient is not nervous/anxious.    Objective:  BP (!) 142/84 (BP Location: Left Arm)   Pulse 92   Temp 98.1 F (36.7 C) (Oral)   Ht '5\' 11"'  (1.803 m)   Wt 269 lb (122 kg)   SpO2 95%   BMI 37.52 kg/m   BP Readings from Last 3 Encounters:  03/29/21 (!) 142/84  09/29/19 (!) 142/86  04/06/19 (!) 163/72    Wt Readings from Last 3 Encounters:  03/29/21 269 lb (122 kg)  09/29/19 (!) 303 lb 2 oz (137.5 kg)  04/06/19 (!) 305  lb (138.3 kg)    Physical Exam Constitutional:      General: He is not in acute distress.    Appearance: He is well-developed. He is obese.     Comments: NAD  Eyes:     Conjunctiva/sclera: Conjunctivae normal.     Pupils: Pupils are equal, round, and reactive to light.  Neck:     Thyroid: No thyromegaly.     Vascular: No JVD.  Cardiovascular:     Rate and Rhythm: Normal rate and regular rhythm.     Heart sounds: Normal heart sounds. No murmur heard.   No friction rub. No gallop.  Pulmonary:     Effort: Pulmonary effort is normal. No respiratory distress.     Breath sounds: Normal breath sounds. No wheezing or rales.  Chest:     Chest wall: No tenderness.  Abdominal:     General: Bowel sounds are normal. There is no distension.     Palpations: Abdomen is soft. There is no mass.     Tenderness: There is no abdominal tenderness. There is no guarding or rebound.  Musculoskeletal:        General: No tenderness. Normal range of motion.     Cervical back: Normal range of motion.  Lymphadenopathy:     Cervical: No cervical adenopathy.  Skin:    General: Skin is warm and dry.     Findings: No rash.  Neurological:     Mental Status: He is alert and oriented to person, place, and time.     Cranial Nerves: No cranial nerve deficit.     Motor: No abnormal muscle tone.     Coordination: Coordination normal.     Gait: Gait normal.     Deep Tendon Reflexes: Reflexes are normal and symmetric.  Psychiatric:        Behavior: Behavior normal.        Thought Content: Thought content normal.        Judgment: Judgment normal.    Lab Results  Component Value Date   WBC 4.5 03/26/2021   HGB 13.9 03/26/2021   HCT 40.3 03/26/2021   PLT 197.0 03/26/2021   GLUCOSE 303 (H) 03/26/2021   CHOL 220 (H) 03/26/2021   TRIG 133.0 03/26/2021   HDL 53.90 03/26/2021   LDLDIRECT 89.0 08/16/2016   LDLCALC 139 (H) 03/26/2021   ALT 19 03/26/2021   AST 15 03/26/2021   NA 137 03/26/2021   K 4.2  03/26/2021   CL 97 03/26/2021   CREATININE 0.78 03/26/2021   BUN 11 03/26/2021   CO2 30 03/26/2021   TSH 0.98 03/26/2021   PSA 0.00 Repeated and verified X2. (L) 03/26/2021   INR 0.91 01/06/2013   HGBA1C 12.9 (H) 03/26/2021   MICROALBUR 144.8 (H) 03/26/2021    No  results found.  Assessment & Plan:   Problem List Items Addressed This Visit     Uncontrolled type 2 diabetes mellitus with hyperglycemia, without long-term current use of insulin ([place])    Worse Rodney Hayes re-started fasting      Relevant Medications   losartan (COZAAR) 100 MG tablet   Other Relevant Orders   Comprehensive metabolic panel   Hemoglobin A1c   Other Visit Diagnoses     Colon cancer screening    -  Primary   Relevant Orders   Ambulatory referral to Gastroenterology          Walker Kehr, MD

## 2021-04-01 NOTE — Assessment & Plan Note (Signed)

## 2021-04-02 ENCOUNTER — Other Ambulatory Visit (HOSPITAL_COMMUNITY): Payer: Self-pay

## 2021-04-03 DIAGNOSIS — G4733 Obstructive sleep apnea (adult) (pediatric): Secondary | ICD-10-CM | POA: Diagnosis not present

## 2021-05-10 ENCOUNTER — Other Ambulatory Visit (HOSPITAL_COMMUNITY): Payer: Self-pay

## 2021-06-25 ENCOUNTER — Other Ambulatory Visit (INDEPENDENT_AMBULATORY_CARE_PROVIDER_SITE_OTHER): Payer: 59

## 2021-06-25 DIAGNOSIS — E1165 Type 2 diabetes mellitus with hyperglycemia: Secondary | ICD-10-CM | POA: Diagnosis not present

## 2021-06-25 LAB — COMPREHENSIVE METABOLIC PANEL
ALT: 27 U/L (ref 0–53)
AST: 19 U/L (ref 0–37)
Albumin: 4.1 g/dL (ref 3.5–5.2)
Alkaline Phosphatase: 72 U/L (ref 39–117)
BUN: 19 mg/dL (ref 6–23)
CO2: 30 mEq/L (ref 19–32)
Calcium: 9.4 mg/dL (ref 8.4–10.5)
Chloride: 97 mEq/L (ref 96–112)
Creatinine, Ser: 0.81 mg/dL (ref 0.40–1.50)
GFR: 87.91 mL/min (ref >60.00)
Glucose, Bld: 299 mg/dL — ABNORMAL HIGH (ref 70–99)
Potassium: 4.4 mEq/L (ref 3.5–5.1)
Sodium: 136 mEq/L (ref 135–145)
Total Bilirubin: 0.7 mg/dL (ref 0.2–1.2)
Total Protein: 6.6 g/dL (ref 6.0–8.3)

## 2021-06-25 LAB — PSA: PSA: 0 ng/mL — ABNORMAL LOW (ref 0.10–4.00)

## 2021-06-25 LAB — HEMOGLOBIN A1C: Hgb A1c MFr Bld: 12.4 % — ABNORMAL HIGH (ref 4.6–6.5)

## 2021-06-26 ENCOUNTER — Other Ambulatory Visit (HOSPITAL_COMMUNITY): Payer: Self-pay

## 2021-06-26 ENCOUNTER — Encounter: Payer: Self-pay | Admitting: Internal Medicine

## 2021-06-26 ENCOUNTER — Other Ambulatory Visit: Payer: Self-pay

## 2021-06-26 ENCOUNTER — Ambulatory Visit: Payer: 59 | Admitting: Internal Medicine

## 2021-06-26 DIAGNOSIS — C61 Malignant neoplasm of prostate: Secondary | ICD-10-CM

## 2021-06-26 DIAGNOSIS — M159 Polyosteoarthritis, unspecified: Secondary | ICD-10-CM | POA: Diagnosis not present

## 2021-06-26 DIAGNOSIS — E1142 Type 2 diabetes mellitus with diabetic polyneuropathy: Secondary | ICD-10-CM

## 2021-06-26 DIAGNOSIS — E1165 Type 2 diabetes mellitus with hyperglycemia: Secondary | ICD-10-CM | POA: Diagnosis not present

## 2021-06-26 MED ORDER — INSULIN DETEMIR 100 UNIT/ML ~~LOC~~ SOLN
SUBCUTANEOUS | 11 refills | Status: DC
  Filled 2021-06-26: qty 10, fill #0

## 2021-06-26 MED ORDER — TRAMADOL HCL 50 MG PO TABS
50.0000 mg | ORAL_TABLET | Freq: Four times a day (QID) | ORAL | 0 refills | Status: AC | PRN
Start: 2021-06-26 — End: 2021-07-03
  Filled 2021-06-26: qty 20, 5d supply, fill #0

## 2021-06-26 MED ORDER — LEVEMIR FLEXTOUCH 100 UNIT/ML ~~LOC~~ SOPN
PEN_INJECTOR | SUBCUTANEOUS | 11 refills | Status: DC
  Filled 2021-06-26: qty 15, fill #0
  Filled 2021-06-28: qty 15, 75d supply, fill #0

## 2021-06-26 MED ORDER — UNIFINE PENTIPS 31G X 5 MM MISC
3 refills | Status: DC
  Filled 2021-06-26: qty 100, 90d supply, fill #0

## 2021-06-26 NOTE — Progress Notes (Signed)
Subjective:  Patient ID: Rodney Hayes, male    DOB: 03-Jun-1951  Age: 70 y.o. MRN: [id number]  CC: Follow-up (3 month f/u)   HPI Rodney Hayes presents for DM, prostate cancer  C/o OA -multiple joints; severe pain after work days, stiffness  Outpatient Medications Prior to Visit  Medication Sig Dispense Refill   amLODipine (NORVASC) 5 MG tablet Take 1 tablet (5 mg total) by mouth daily. 90 tablet 3   chlorhexidine (PERIDEX) 0.12 % solution RINSE MOUTH WITH 15ML (1 CAPFUL) FOR 30 SECONDS AM AND PM AFTER TOOTHBRUSHING. EXPECTORATE AFTER RINSING, DO NOT SWALLOW 473 mL 0   Cholecalciferol (VITAMIN D3) 50 MCG (2000 UT) capsule Take 1 capsule (2,000 Units total) by mouth daily. 100 capsule 3   glucose blood test strip Use as directed four times daily 200 each 12   glucose monitoring kit (FREESTYLE) monitoring kit 1 each by Does not apply route as needed for other. 1 each 1   hydrochlorothiazide (HYDRODIURIL) 25 MG tablet Take 1 tablet (25 mg total) by mouth daily. 90 tablet 3   icosapent Ethyl (VASCEPA) 1 g capsule Take 2 capsules (2 g total) by mouth 2 (two) times daily. 360 capsule 3   Lancets (FREESTYLE) lancets Use as instructed qid 200 each 12   Lancets Misc. (ACCU-CHEK FASTCLIX LANCET) KIT Use as directed 1 kit 5   losartan (COZAAR) 100 MG tablet Take 1 tablet (100 mg total) by mouth daily. 90 tablet 3   psyllium (METAMUCIL) 58.6 % packet Take 1 packet by mouth daily.     No facility-administered medications prior to visit.    ROS: Review of Systems  Constitutional:  Negative for appetite change, fatigue and unexpected weight change.  HENT:  Negative for congestion, nosebleeds, sneezing, sore throat and trouble swallowing.   Eyes:  Positive for visual disturbance. Negative for itching.  Respiratory:  Negative for cough.   Cardiovascular:  Negative for chest pain, palpitations and leg swelling.  Gastrointestinal:  Negative for abdominal distention, blood in stool, diarrhea and  nausea.  Genitourinary:  Negative for frequency and hematuria.  Musculoskeletal:  Positive for arthralgias and back pain. Negative for gait problem, joint swelling and neck pain.  Skin:  Negative for rash.  Neurological:  Negative for dizziness, tremors, speech difficulty and weakness.  Psychiatric/Behavioral:  Negative for agitation, dysphoric mood and sleep disturbance. The patient is not nervous/anxious.    Objective:  BP 138/76 (BP Location: Left Arm)    Pulse (!) 58    Temp 98.4 F (36.9 C) (Oral)    Ht _0  (1.803 m)    Wt 284 lb (128.8 kg)    SpO2 96%    BMI 39.61 kg/m   BP Readings from Last 3 Encounters:  06/26/21 138/76  03/29/21 (!) 142/84  09/29/19 (!) 142/86    Wt Readings from Last 3 Encounters:  06/26/21 284 lb (128.8 kg)  03/29/21 269 lb (122 kg)  09/29/19 (!) 303 lb 2 oz (137.5 kg)    Physical Exam Constitutional:      General: He is not in acute distress.    Appearance: He is well-developed. He is obese.     Comments: NAD  Eyes:     Conjunctiva/sclera: Conjunctivae normal.     Pupils: Pupils are equal, round, and reactive to light.  Neck:     Thyroid: No thyromegaly.     Vascular: No JVD.  Cardiovascular:     Rate and Rhythm: Normal rate and regular rhythm.  Heart sounds: Normal heart sounds. No murmur heard.   No friction rub. No gallop.  Pulmonary:     Effort: Pulmonary effort is normal. No respiratory distress.     Breath sounds: Normal breath sounds. No wheezing or rales.  Chest:     Chest wall: No tenderness.  Abdominal:     General: Bowel sounds are normal. There is no distension.     Palpations: Abdomen is soft. There is no mass.     Tenderness: There is no abdominal tenderness. There is no guarding or rebound.  Musculoskeletal:        General: No tenderness. Normal range of motion.     Cervical back: Normal range of motion.  Lymphadenopathy:     Cervical: No cervical adenopathy.  Skin:    General: Skin is warm and dry.      Findings: No rash.  Neurological:     Mental Status: He is alert and oriented to person, place, and time.     Cranial Nerves: No cranial nerve deficit.     Motor: No abnormal muscle tone.     Coordination: Coordination normal.     Gait: Gait abnormal.     Deep Tendon Reflexes: Reflexes are normal and symmetric.  Psychiatric:        Behavior: Behavior normal.        Thought Content: Thought content normal.        Judgment: Judgment normal.  Antalgic gait  Lab Results  Component Value Date   WBC 4.5 03/26/2021   HGB 13.9 03/26/2021   HCT 40.3 03/26/2021   PLT 197.0 03/26/2021   GLUCOSE 299 (H) 06/25/2021   CHOL 220 (H) 03/26/2021   TRIG 133.0 03/26/2021   HDL 53.90 03/26/2021   LDLDIRECT 89.0 08/16/2016   LDLCALC 139 (H) 03/26/2021   ALT 27 06/25/2021   AST 19 06/25/2021   NA 136 06/25/2021   K 4.4 06/25/2021   CL 97 06/25/2021   CREATININE 0.81 06/25/2021   BUN 19 06/25/2021   CO2 30 06/25/2021   TSH 0.98 03/26/2021   PSA 0.00 (L) 06/25/2021   INR 0.91 01/06/2013   HGBA1C 12.4 (H) 06/25/2021   MICROALBUR 144.8 (H) 03/26/2021    No results found.  Assessment & Plan:   Problem List Items Addressed This Visit     Diabetic neuropathy ([place])    Bilateral feet burning pain at night.  Worse.  Hopefully it will improve with better CBG control.  Levemir was started.      Relevant Medications   insulin detemir (LEVEMIR FLEXTOUCH) 100 UNIT/ML FlexPen   Osteoarthritis of multiple joints    Worse.  Start tramadol as needed  Potential benefits of a long term opioids use as well as potential risks (i.e. addiction risk, apnea etc) and complications (i.e. Somnolence, constipation and others) were explained to the patient and were aknowledged.       Relevant Medications   traMADol (ULTRAM) 50 MG tablet   Prostate cancer ([place])    Recent PSA=0      Uncontrolled type 2 diabetes mellitus with hyperglycemia, without long-term current use of insulin ([place])    12/22 worse.   Rodney Hayes declined [place] and Ozempic options due to concerns of potential cancer. Rodney Hayes never used insulin.  We will start Levemir at night.  She will titrate up for better sugar control.  He will improve diet.  He will skip insulin if ICA decides to start intermittent fasting again.      Relevant Medications  insulin detemir (LEVEMIR FLEXTOUCH) 100 UNIT/ML FlexPen      Meds ordered this encounter  Medications   DISCONTD: insulin detemir (LEVEMIR) 100 UNIT/ML injection    Sig: 5-20 units at hs. Titrate up    Dispense:  10 mL    Refill:  11   insulin detemir (LEVEMIR FLEXTOUCH) 100 UNIT/ML FlexPen    Sig: inject  5-20 units subcutaneously at bedtime as directed    Dispense:  15 mL    Refill:  11   Insulin Pen Needle (UNIFINE PENTIPS) 31G X 5 MM MISC    Sig: use as directed at bedtime    Dispense:  100 each    Refill:  3   traMADol (ULTRAM) 50 MG tablet    Sig: Take 1 tablet (50 mg total) by mouth every 6 (six) hours as needed for up to 5 days.    Dispense:  20 tablet    Refill:  0      Follow-up: Return in about 3 months (around 09/24/2021) for a follow-up visit.  Walker Kehr, MD

## 2021-06-26 NOTE — Assessment & Plan Note (Addendum)
12/22 worse.  Rodney Hayes declined [place] and Ozempic options due to concerns of potential cancer. Rodney Hayes never used insulin.  We will start Levemir at night.  She will titrate up for better sugar control.  He will improve diet.  He will skip insulin if ICA decides to start intermittent fasting again.

## 2021-06-26 NOTE — Assessment & Plan Note (Addendum)
Recent PSA=0

## 2021-06-27 ENCOUNTER — Other Ambulatory Visit (HOSPITAL_COMMUNITY): Payer: Self-pay

## 2021-06-27 DIAGNOSIS — E1140 Type 2 diabetes mellitus with diabetic neuropathy, unspecified: Secondary | ICD-10-CM | POA: Insufficient documentation

## 2021-06-27 MED ORDER — SODIUM FLUORIDE 1.1 % DT PSTE
PASTE | DENTAL | 11 refills | Status: DC
Start: 2021-06-27 — End: 2023-08-22
  Filled 2021-06-27: qty 200, 60d supply, fill #0
  Filled 2022-04-02: qty 100, 30d supply, fill #1
  Filled 2022-05-09: qty 100, 30d supply, fill #2

## 2021-06-27 NOTE — Assessment & Plan Note (Signed)
Worse.  Start tramadol as needed  Potential benefits of a long term opioids use as well as potential risks (i.e. addiction risk, apnea etc) and complications (i.e. Somnolence, constipation and others) were explained to the patient and were aknowledged.

## 2021-06-27 NOTE — Assessment & Plan Note (Addendum)
Bilateral feet burning pain at night.  Worse.  Hopefully it will improve with better CBG control.  Levemir was started.

## 2021-06-28 ENCOUNTER — Other Ambulatory Visit (HOSPITAL_COMMUNITY): Payer: Self-pay

## 2021-06-29 ENCOUNTER — Other Ambulatory Visit (HOSPITAL_COMMUNITY): Payer: Self-pay

## 2021-07-04 ENCOUNTER — Other Ambulatory Visit (HOSPITAL_COMMUNITY): Payer: Self-pay

## 2021-07-04 ENCOUNTER — Other Ambulatory Visit: Payer: Self-pay | Admitting: Internal Medicine

## 2021-07-04 DIAGNOSIS — N393 Stress incontinence (female) (male): Secondary | ICD-10-CM | POA: Diagnosis not present

## 2021-07-04 DIAGNOSIS — N5231 Erectile dysfunction following radical prostatectomy: Secondary | ICD-10-CM | POA: Diagnosis not present

## 2021-07-04 DIAGNOSIS — C61 Malignant neoplasm of prostate: Secondary | ICD-10-CM | POA: Diagnosis not present

## 2021-07-04 MED ORDER — FREESTYLE LITE TEST VI STRP
ORAL_STRIP | 3 refills | Status: DC
  Filled 2021-07-04: qty 200, 50d supply, fill #0
  Filled 2021-10-03: qty 200, 50d supply, fill #1
  Filled 2022-04-02: qty 200, 50d supply, fill #2
  Filled 2022-05-09: qty 200, 50d supply, fill #3

## 2021-07-04 MED ORDER — TADALAFIL 20 MG PO TABS
20.0000 mg | ORAL_TABLET | ORAL | 11 refills | Status: DC | PRN
  Filled 2021-07-04: qty 6, 30d supply, fill #0
  Filled 2022-05-09: qty 6, 30d supply, fill #1
  Filled 2022-05-24: qty 24, 24d supply, fill #2

## 2021-07-10 ENCOUNTER — Other Ambulatory Visit (HOSPITAL_COMMUNITY): Payer: Self-pay

## 2021-07-12 ENCOUNTER — Encounter: Payer: Self-pay | Admitting: Internal Medicine

## 2021-07-12 ENCOUNTER — Other Ambulatory Visit: Payer: Self-pay | Admitting: Internal Medicine

## 2021-07-12 ENCOUNTER — Other Ambulatory Visit (HOSPITAL_COMMUNITY): Payer: Self-pay

## 2021-07-12 MED ORDER — TRAMADOL HCL 50 MG PO TABS
50.0000 mg | ORAL_TABLET | ORAL | 3 refills | Status: DC
Start: 2021-07-12 — End: 2022-02-20
  Filled 2021-07-12: qty 120, 30d supply, fill #0
  Filled 2021-09-01: qty 120, 30d supply, fill #1
  Filled 2021-11-08: qty 120, 30d supply, fill #2
  Filled 2021-12-22: qty 120, 30d supply, fill #3

## 2021-07-13 ENCOUNTER — Other Ambulatory Visit (HOSPITAL_COMMUNITY): Payer: Self-pay

## 2021-07-24 DIAGNOSIS — G4733 Obstructive sleep apnea (adult) (pediatric): Secondary | ICD-10-CM | POA: Diagnosis not present

## 2021-07-31 ENCOUNTER — Inpatient Hospital Stay (HOSPITAL_COMMUNITY): Payer: 59

## 2021-07-31 ENCOUNTER — Inpatient Hospital Stay (HOSPITAL_COMMUNITY)
Admission: AD | Admit: 2021-07-31 | Discharge: 2021-08-01 | DRG: 177 | Disposition: A | Discharge status: Home / Self Care | Payer: 59 | Source: Ambulatory Visit | Attending: Internal Medicine | Admitting: Internal Medicine

## 2021-07-31 ENCOUNTER — Other Ambulatory Visit (HOSPITAL_COMMUNITY): Payer: Self-pay

## 2021-07-31 DIAGNOSIS — E785 Hyperlipidemia, unspecified: Secondary | ICD-10-CM | POA: Diagnosis present

## 2021-07-31 DIAGNOSIS — J9601 Acute respiratory failure with hypoxia: Secondary | ICD-10-CM | POA: Diagnosis not present

## 2021-07-31 DIAGNOSIS — J45909 Unspecified asthma, uncomplicated: Secondary | ICD-10-CM | POA: Diagnosis present

## 2021-07-31 DIAGNOSIS — Z833 Family history of diabetes mellitus: Secondary | ICD-10-CM | POA: Diagnosis not present

## 2021-07-31 DIAGNOSIS — E1140 Type 2 diabetes mellitus with diabetic neuropathy, unspecified: Secondary | ICD-10-CM | POA: Diagnosis not present

## 2021-07-31 DIAGNOSIS — J1282 Pneumonia due to coronavirus disease 2019: Secondary | ICD-10-CM | POA: Diagnosis not present

## 2021-07-31 DIAGNOSIS — G4733 Obstructive sleep apnea (adult) (pediatric): Secondary | ICD-10-CM | POA: Diagnosis present

## 2021-07-31 DIAGNOSIS — U071 COVID-19: Secondary | ICD-10-CM | POA: Diagnosis not present

## 2021-07-31 DIAGNOSIS — J80 Acute respiratory distress syndrome: Secondary | ICD-10-CM

## 2021-07-31 DIAGNOSIS — J811 Chronic pulmonary edema: Secondary | ICD-10-CM | POA: Diagnosis not present

## 2021-07-31 DIAGNOSIS — Z888 Allergy status to other drugs, medicaments and biological substances status: Secondary | ICD-10-CM

## 2021-07-31 DIAGNOSIS — Z811 Family history of alcohol abuse and dependence: Secondary | ICD-10-CM | POA: Diagnosis not present

## 2021-07-31 DIAGNOSIS — Z87891 Personal history of nicotine dependence: Secondary | ICD-10-CM | POA: Diagnosis not present

## 2021-07-31 DIAGNOSIS — I4891 Unspecified atrial fibrillation: Secondary | ICD-10-CM | POA: Diagnosis present

## 2021-07-31 DIAGNOSIS — E1165 Type 2 diabetes mellitus with hyperglycemia: Secondary | ICD-10-CM | POA: Diagnosis present

## 2021-07-31 DIAGNOSIS — Z801 Family history of malignant neoplasm of trachea, bronchus and lung: Secondary | ICD-10-CM

## 2021-07-31 DIAGNOSIS — I10 Essential (primary) hypertension: Secondary | ICD-10-CM | POA: Diagnosis present

## 2021-07-31 DIAGNOSIS — Z881 Allergy status to other antibiotic agents status: Secondary | ICD-10-CM

## 2021-07-31 DIAGNOSIS — R269 Unspecified abnormalities of gait and mobility: Secondary | ICD-10-CM | POA: Diagnosis not present

## 2021-07-31 DIAGNOSIS — Z8249 Family history of ischemic heart disease and other diseases of the circulatory system: Secondary | ICD-10-CM | POA: Diagnosis not present

## 2021-07-31 DIAGNOSIS — N400 Enlarged prostate without lower urinary tract symptoms: Secondary | ICD-10-CM | POA: Diagnosis present

## 2021-07-31 LAB — TROPONIN I (HIGH SENSITIVITY)
Troponin I (High Sensitivity): 22 ng/L — ABNORMAL HIGH (ref <18)
Troponin I (High Sensitivity): 25 ng/L — ABNORMAL HIGH (ref <18)

## 2021-07-31 LAB — CBC WITH DIFFERENTIAL/PLATELET
Abs Immature Granulocytes: 0.02 10*3/uL (ref 0.00–0.07)
Basophils Absolute: 0 10*3/uL (ref 0.0–0.1)
Basophils Relative: 0 %
Eosinophils Absolute: 0.1 10*3/uL (ref 0.0–0.5)
Eosinophils Relative: 1 %
HCT: 39.8 % (ref 39.0–52.0)
Hemoglobin: 13.4 g/dL (ref 13.0–17.0)
Immature Granulocytes: 0 %
Lymphocytes Relative: 4 %
Lymphs Abs: 0.2 10*3/uL — ABNORMAL LOW (ref 0.7–4.0)
MCH: 31.6 pg (ref 26.0–34.0)
MCHC: 33.7 g/dL (ref 30.0–36.0)
MCV: 93.9 fL (ref 80.0–100.0)
Monocytes Absolute: 0.6 10*3/uL (ref 0.1–1.0)
Monocytes Relative: 9 %
Neutro Abs: 5.7 10*3/uL (ref 1.7–7.7)
Neutrophils Relative %: 86 %
Platelets: 170 10*3/uL (ref 150–400)
RBC: 4.24 MIL/uL (ref 4.22–5.81)
RDW: 12.2 % (ref 11.5–15.5)
WBC: 6.7 10*3/uL (ref 4.0–10.5)
nRBC: 0 % (ref 0.0–0.2)

## 2021-07-31 LAB — COMPREHENSIVE METABOLIC PANEL
ALT: 39 U/L (ref 0–44)
AST: 29 U/L (ref 15–41)
Albumin: 4 g/dL (ref 3.5–5.0)
Alkaline Phosphatase: 68 U/L (ref 38–126)
Anion gap: 11 (ref 5–15)
BUN: 15 mg/dL (ref 8–23)
CO2: 29 mmol/L (ref 22–32)
Calcium: 9.3 mg/dL (ref 8.9–10.3)
Chloride: 95 mmol/L — ABNORMAL LOW (ref 98–111)
Creatinine, Ser: 0.78 mg/dL (ref 0.61–1.24)
GFR, Estimated: >60 mL/min (ref >60)
Glucose, Bld: 255 mg/dL — ABNORMAL HIGH (ref 70–99)
Potassium: 3.7 mmol/L (ref 3.5–5.1)
Sodium: 135 mmol/L (ref 135–145)
Total Bilirubin: 0.9 mg/dL (ref 0.3–1.2)
Total Protein: 6.9 g/dL (ref 6.5–8.1)

## 2021-07-31 LAB — FERRITIN: Ferritin: 370 ng/mL — ABNORMAL HIGH (ref 24–336)

## 2021-07-31 LAB — GLUCOSE, CAPILLARY
Glucose-Capillary: 196 mg/dL — ABNORMAL HIGH (ref 70–99)
Glucose-Capillary: 262 mg/dL — ABNORMAL HIGH (ref 70–99)
Glucose-Capillary: 264 mg/dL — ABNORMAL HIGH (ref 70–99)

## 2021-07-31 LAB — LACTATE DEHYDROGENASE: LDH: 210 U/L — ABNORMAL HIGH (ref 98–192)

## 2021-07-31 LAB — D-DIMER, QUANTITATIVE: D-Dimer, Quant: 1.15 ug/mL-FEU — ABNORMAL HIGH (ref 0.00–0.50)

## 2021-07-31 MED ORDER — BISACODYL 10 MG RE SUPP: 10.0000 mg | Freq: Every day | RECTAL | Status: DC | PRN

## 2021-07-31 MED ORDER — AMLODIPINE BESYLATE 5 MG PO TABS
5.0000 mg | ORAL_TABLET | Freq: Every day | ORAL | Status: DC
  Administered 2021-08-01: 5 mg via ORAL
  Filled 2021-07-31: qty 1

## 2021-07-31 MED ORDER — INSULIN ASPART 100 UNIT/ML IJ SOLN
0.0000 [IU] | INTRAMUSCULAR | Status: DC
  Administered 2021-07-31: 11 [IU] via SUBCUTANEOUS
  Administered 2021-07-31: 4 [IU] via SUBCUTANEOUS
  Administered 2021-08-01 (×3): 11 [IU] via SUBCUTANEOUS
  Administered 2021-08-01: 20 [IU] via SUBCUTANEOUS

## 2021-07-31 MED ORDER — ADULT MULTIVITAMIN W/MINERALS CH
1.0000 | ORAL_TABLET | Freq: Every day | ORAL | Status: DC
  Administered 2021-07-31 – 2021-08-01 (×2): 1 via ORAL
  Filled 2021-07-31 (×2): qty 1

## 2021-07-31 MED ORDER — CARESTART COVID-19 HOME TEST VI KIT
PACK | 0 refills | Status: DC
  Filled 2021-07-31: qty 4, 4d supply, fill #0

## 2021-07-31 MED ORDER — INSULIN DETEMIR 100 UNIT/ML ~~LOC~~ SOLN
15.0000 [IU] | Freq: Two times a day (BID) | SUBCUTANEOUS | Status: DC
  Filled 2021-07-31 (×2): qty 0.15

## 2021-07-31 MED ORDER — MOLNUPIRAVIR 200 MG PO CAPS
ORAL_CAPSULE | ORAL | 0 refills | Status: DC
  Filled 2021-07-31: qty 40, 5d supply, fill #0

## 2021-07-31 MED ORDER — ONDANSETRON HCL 4 MG PO TABS: 4.0000 mg | ORAL_TABLET | Freq: Four times a day (QID) | ORAL | Status: DC | PRN

## 2021-07-31 MED ORDER — OXYCODONE HCL 5 MG PO TABS: 5.0000 mg | ORAL_TABLET | ORAL | Status: DC | PRN

## 2021-07-31 MED ORDER — TRAMADOL HCL 50 MG PO TABS
50.0000 mg | ORAL_TABLET | Freq: Four times a day (QID) | ORAL | Status: DC | PRN
Start: 2021-07-31 — End: 2021-08-01
  Administered 2021-07-31 – 2021-08-01 (×2): 50 mg via ORAL
  Filled 2021-07-31 (×2): qty 1

## 2021-07-31 MED ORDER — METHYLPREDNISOLONE SODIUM SUCC 40 MG IJ SOLR
40.0000 mg | Freq: Two times a day (BID) | INTRAMUSCULAR | Status: DC
  Administered 2021-07-31 – 2021-08-01 (×2): 40 mg via INTRAVENOUS
  Filled 2021-07-31 (×2): qty 1

## 2021-07-31 MED ORDER — ENOXAPARIN SODIUM 80 MG/0.8ML IJ SOSY
65.0000 mg | PREFILLED_SYRINGE | INTRAMUSCULAR | Status: DC
  Administered 2021-07-31: 65 mg via SUBCUTANEOUS
  Filled 2021-07-31: qty 0.8

## 2021-07-31 MED ORDER — SENNOSIDES-DOCUSATE SODIUM 8.6-50 MG PO TABS: 1.0000 | ORAL_TABLET | Freq: Every evening | ORAL | Status: DC | PRN

## 2021-07-31 MED ORDER — ZOLPIDEM TARTRATE 5 MG PO TABS
5.0000 mg | ORAL_TABLET | Freq: Every evening | ORAL | Status: DC | PRN
  Administered 2021-07-31: 5 mg via ORAL
  Filled 2021-07-31: qty 1

## 2021-07-31 MED ORDER — ACETAMINOPHEN 325 MG PO TABS: 650.0000 mg | ORAL_TABLET | Freq: Four times a day (QID) | ORAL | Status: DC | PRN

## 2021-07-31 MED ORDER — ONDANSETRON HCL 4 MG/2ML IJ SOLN: 4.0000 mg | Freq: Four times a day (QID) | INTRAMUSCULAR | Status: DC | PRN

## 2021-07-31 NOTE — H&P (Signed)
NAME:  Rodney Hayes, MRN:  [id number], DOB:  [birth year], LOS: 0 ADMISSION DATE:  07/31/2021, CONSULTATION DATE:  NA REFERRING MD:  home , CHIEF COMPLAINT:  acute dyspnea COVID +    History of Present Illness:  71 year old male. Non-smoker. Works as Acute Care NP. Was in [place] until 1/17 while at work suddenly had fairly acute onset of fever and chills. Went home fearing sick exposure. Home Covid tests + times 2. Went home around 11am. From time he arrived home to approx 5pm had progressive shortness of breath, felt chest tightness and that "just couldn't get his breath". Home pulse ox mid-80s. Tried his home CPAP which helped get sats > 90 but given rapid progression of sxs presented for admission.   Pertinent  Medical History  HTN, type II DM (on insulin), diabetic neuropathy, osteoarthritis s/p multiple joint replacements, obstructive sleep apnea.   Significant Hospital Events: Including procedures, antibiotic start and stop dates in addition to other pertinent events   1/17 admitted w/ covid PNA and associated respiratory failure   Interim History / Subjective:  Feels a little better on oxygen   Objective   There were no vitals taken for this visit.       No intake or output data in the 24 hours ending 07/31/21 1710 There were no vitals filed for this visit.  Examination: General: 71 year old male. Resting in chair no distress.  HENT: NCAT no JVD  Lungs: dec bases. Currently 4lpm Cardiovascular: rrr Abdomen: soft  Extremities: no edema  Neuro: awake and alert  GU: voids  Resolved Hospital Problem list     Assessment & Plan:  Acute hypoxic respiratory failure 2/2 COVID PNA Plan Admit to progressive Supplemental oxygen  Checking confirmatory COVID as well as influenza IV decadron  Checking inflammatory markers. May need to consider IL-6 inhibitor  Resp isolation   H/o HTN Plan Cont home norvasc  H/o dm type II Plan Ssi Cont home insulin   OSA Plan CPAP at  hs   Best Practice (right click and "Reselect all SmartList Selections" daily)   Diet/type: Regular consistency (see orders) DVT prophylaxis: LMWH GI prophylaxis: N/A Lines: N/A Foley:  N/A Code Status:  full code Last date of multidisciplinary goals of care discussion [pending]  Labs   CBC: No results for input(s): WBC, NEUTROABS, HGB, HCT, MCV, PLT in the last 168 hours.  Basic Metabolic Panel: No results for input(s): NA, K, CL, CO2, GLUCOSE, BUN, CREATININE, CALCIUM, MG, PHOS in the last 168 hours. GFR: CrCl cannot be calculated (Patient's most recent lab result is older than the maximum 21 days allowed.). No results for input(s): PROCALCITON, WBC, LATICACIDVEN in the last 168 hours.  Liver Function Tests: No results for input(s): AST, ALT, ALKPHOS, BILITOT, PROT, ALBUMIN in the last 168 hours. No results for input(s): LIPASE, AMYLASE in the last 168 hours. No results for input(s): AMMONIA in the last 168 hours.  ABG No results found for: PHART, PCO2ART, PO2ART, HCO3, TCO2, ACIDBASEDEF, O2SAT   Coagulation Profile: No results for input(s): INR, PROTIME in the last 168 hours.  Cardiac Enzymes: No results for input(s): CKTOTAL, CKMB, CKMBINDEX, TROPONINI in the last 168 hours.  HbA1C: Hgb A1c MFr Bld  Date/Time Value Ref Range Status  06/25/2021 09:39 AM 12.4 (H) 4.6 - 6.5 % Final    Comment:    Glycemic Control Guidelines for People with Diabetes:Non Diabetic:  <6%Goal of Therapy: <7%Additional Action Suggested:  >8%   03/26/2021 10:42  AM 12.9 (H) 4.6 - 6.5 % Final    Comment:    Glycemic Control Guidelines for People with Diabetes:Non Diabetic:  <6%Goal of Therapy: <7%Additional Action Suggested:  >8%     CBG: No results for input(s): GLUCAP in the last 168 hours.  Review of Systems:   Review of Systems  Constitutional:  Positive for chills, fever and malaise/fatigue.  HENT: Negative.    Eyes: Negative.   Respiratory:  Positive for cough and shortness of  breath.   Cardiovascular: Negative.   Gastrointestinal: Negative.   Genitourinary: Negative.   Musculoskeletal: Negative.   Skin: Negative.   Neurological: Negative.   Endo/Heme/Allergies: Negative.   Psychiatric/Behavioral: Negative.      Past Medical History:  He,  has a past medical history of Arthritis, Asthma, BPH (benign prostatic hyperplasia), Cancer ([place]), Diabetes mellitus, DJD (degenerative joint disease), Heart murmur, Hepatitis, Hyperlipidemia, Hypertension, Obesity, and Sleep apnea.   Surgical History:   Past Surgical History:  Procedure Laterality Date   Biceps tendon reinsertion     colonoscopy with polypectomy     adenomatous; [name]   JOINT REPLACEMENT Left    knee   left knee arthroscopy     LYMPHADENECTOMY Bilateral 01/23/2017   Procedure: BILATERAL LYMPHADENECTOMY;  Surgeon: [name], MD;  Location: [facility];  Service: Urology;  Laterality: Bilateral;   PROSTATECTOMY     01/23/17 [name]   ROBOT ASSISTED LAPAROSCOPIC RADICAL PROSTATECTOMY N/A 01/23/2017   Procedure: XI ROBOTIC ASSISTED LAPAROSCOPIC RADICAL PROSTATECTOMY LEVEL 3;  Surgeon: [name], MD;  Location: [facility];  Service: Urology;  Laterality: N/A;   TONSILLECTOMY AND ADENOIDECTOMY     TOTAL KNEE ARTHROPLASTY Left 01/11/2013   Procedure: LEFT TOTAL KNEE ARTHROPLASTY;  Surgeon: [name], MD;  Location: [facility];  Service: Orthopedics;  Laterality: Left;   TOTAL SHOULDER ARTHROPLASTY Left 08/12/2014   Procedure: LEFT TOTAL SHOULDER ARTHROPLASTY;  Surgeon: [name], MD;  Location: [place];  Service: Orthopedics;  Laterality: Left;   UMBILICAL HERNIA REPAIR       Social History:   reports that he quit smoking about 28 years ago. His smoking use included cigarettes. He started smoking about 59 years ago. He has a 60.00 pack-year smoking history. He has never used smokeless tobacco. He reports current alcohol use of about 4.0 standard drinks per week. He reports that he does not use  drugs.   Family History:  His family history includes Alcohol abuse in his mother; Cancer in his father; Coronary artery disease in his father; Diabetes in his father and mother; Heart attack in his father; Lung cancer in his mother. There is no history of Stroke.   Allergies Allergies  Allergen Reactions   Cymbalta [Duloxetine Hcl]     As per e-mail 12/13/11: Insomnia, lethargy, inability to ejaculate   Tetracycline     REACTION: PHOTOSENSITIVITY     Home Medications  Prior to Admission medications   Medication Sig Start Date End Date Taking? Authorizing Provider  amLODipine (NORVASC) 5 MG tablet Take 1 tablet (5 mg total) by mouth daily. 03/29/21   Plotnikov, Evie Lacks, MD  Cholecalciferol (VITAMIN D3) 50 MCG (2000 UT) capsule Take 1 capsule (2,000 Units total) by mouth daily. 08/25/18   Plotnikov, Evie Lacks, MD  COVID-19 At Home Antigen Test [facility] COVID-19 HOME TEST) KIT Use as directed within package instructions 07/31/21   Margie Ege, [facility]  glucose blood (FREESTYLE LITE) test strip Use as directed four times daily 07/04/21  Plotnikov, Evie Lacks, MD  glucose monitoring kit (FREESTYLE) monitoring kit 1 each by Does not apply route as needed for other. 04/12/20   Plotnikov, Evie Lacks, MD  hydrochlorothiazide (HYDRODIURIL) 25 MG tablet Take 1 tablet (25 mg total) by mouth daily. 03/29/21   Plotnikov, Evie Lacks, MD  icosapent Ethyl (VASCEPA) 1 g capsule Take 2 capsules (2 g total) by mouth 2 (two) times daily. 03/29/21   Plotnikov, Evie Lacks, MD  insulin detemir (LEVEMIR FLEXTOUCH) 100 UNIT/ML FlexPen Inject 5-20 units subcutaneously at bedtime as directed 06/26/21   Plotnikov, Evie Lacks, MD  Insulin Pen Needle (UNIFINE PENTIPS) 31G X 5 MM MISC use as directed at bedtime 06/26/21   Plotnikov, Evie Lacks, MD  Lancets (FREESTYLE) lancets Use as instructed qid 04/12/20   Plotnikov, Evie Lacks, MD  Lancets Misc. (ACCU-CHEK FASTCLIX LANCET) KIT Use as directed 11/12/16   Noralee Space, MD   losartan (COZAAR) 100 MG tablet Take 1 tablet (100 mg total) by mouth daily. 03/29/21   Plotnikov, Evie Lacks, MD  molnupiravir EUA (LAGEVRIO) 200 MG CAPS capsule Take 4 capsules by mouth every 12 hours for 5 days 07/31/21   Juanito Doom, MD  psyllium (METAMUCIL) 58.6 % packet Take 1 packet by mouth daily.    [provider]  Sodium Fluoride 1.1 % PSTE Use as directed on package 06/27/21     tadalafil (CIALIS) 20 MG tablet Take 1 tablet (20 mg total) by mouth as needed as directed 07/04/21     traMADol (ULTRAM) 50 MG tablet Take 1 tablet by mouth three to four times daily as needed 07/12/21   Plotnikov, Evie Lacks, MD     Critical care time: NA   Erick Colace ACNP-BC [place] Pager # [phone] OR # [phone] if no answer

## 2021-08-01 ENCOUNTER — Other Ambulatory Visit (HOSPITAL_COMMUNITY): Payer: Self-pay

## 2021-08-01 ENCOUNTER — Other Ambulatory Visit: Payer: Self-pay

## 2021-08-01 ENCOUNTER — Inpatient Hospital Stay (HOSPITAL_COMMUNITY): Payer: 59

## 2021-08-01 DIAGNOSIS — E1165 Type 2 diabetes mellitus with hyperglycemia: Secondary | ICD-10-CM

## 2021-08-01 DIAGNOSIS — I10 Essential (primary) hypertension: Secondary | ICD-10-CM

## 2021-08-01 DIAGNOSIS — U071 COVID-19: Secondary | ICD-10-CM

## 2021-08-01 DIAGNOSIS — J1282 Pneumonia due to coronavirus disease 2019: Secondary | ICD-10-CM

## 2021-08-01 LAB — GLUCOSE, CAPILLARY
Glucose-Capillary: 260 mg/dL — ABNORMAL HIGH (ref 70–99)
Glucose-Capillary: 361 mg/dL — ABNORMAL HIGH (ref 70–99)
Glucose-Capillary: 258 mg/dL — ABNORMAL HIGH (ref 70–99)

## 2021-08-01 LAB — PROCALCITONIN: Procalcitonin: <0.1 ng/mL

## 2021-08-01 MED ORDER — DEXAMETHASONE 2 MG PO TABS
6.0000 mg | ORAL_TABLET | Freq: Every day | ORAL | 0 refills | Status: DC
  Filled 2021-08-01: qty 27, 9d supply, fill #0

## 2021-08-01 MED ORDER — LIVING WELL WITH DIABETES BOOK
Freq: Once | Status: AC
  Filled 2021-08-01: qty 1

## 2021-08-01 MED ORDER — MAGNESIUM SULFATE 2 GM/50ML IV SOLN
2.0000 g | Freq: Once | INTRAVENOUS | Status: AC
  Administered 2021-08-01: 2 g via INTRAVENOUS
  Filled 2021-08-01: qty 50

## 2021-08-01 MED ORDER — ZOLPIDEM TARTRATE 5 MG PO TABS
5.0000 mg | ORAL_TABLET | Freq: Every evening | ORAL | 0 refills | Status: DC | PRN
  Filled 2021-08-01: qty 5, 5d supply, fill #0

## 2021-08-01 MED ORDER — CHLORHEXIDINE GLUCONATE 0.12 % MT SOLN
15.0000 mL | Freq: Two times a day (BID) | OROMUCOSAL | Status: DC
  Administered 2021-08-01: 15 mL via OROMUCOSAL
  Filled 2021-08-01: qty 15

## 2021-08-01 MED ORDER — POTASSIUM CHLORIDE CRYS ER 20 MEQ PO TBCR
40.0000 meq | EXTENDED_RELEASE_TABLET | Freq: Once | ORAL | Status: AC
  Administered 2021-08-01: 40 meq via ORAL
  Filled 2021-08-01: qty 2

## 2021-08-01 MED ORDER — INSULIN DETEMIR 100 UNIT/ML ~~LOC~~ SOLN
30.0000 [IU] | Freq: Two times a day (BID) | SUBCUTANEOUS | Status: DC
  Administered 2021-08-01: 30 [IU] via SUBCUTANEOUS
  Filled 2021-08-01 (×2): qty 0.3

## 2021-08-01 MED ORDER — INSULIN DETEMIR 100 UNIT/ML FLEXPEN: 20.0000 [IU] | PEN_INJECTOR | Freq: Two times a day (BID) | SUBCUTANEOUS | 11 refills | Status: DC

## 2021-08-01 MED ORDER — ORAL CARE MOUTH RINSE
15.0000 mL | Freq: Two times a day (BID) | OROMUCOSAL | Status: DC
  Administered 2021-08-01: 15 mL via OROMUCOSAL

## 2021-08-01 NOTE — Consult Note (Signed)
° °  [facility] Inpatient Consult   08/01/2021  Shahzain Kiester [place] [birth year] [id number]   [place] Organization [ACO] Patient: Rodney Hayes plan  Primary Care Provider:  Cassandria Anger, MD, [facility], is listed to provide the [facility] follow up and appointment. This is an Embedded provider with a Chronic Care Management program and team   Patient will be assigned to a [facility] Management for telephonic disease management and community resource support follow up in the health plan.        Plan: Patient will be followed by [place] Coordinator.   For additional questions or referrals please contact:   Natividad Brood, RN BSN [facility] Liaison  [phone] business mobile phone Toll free office [phone]  Fax number: [phone] [email or web address]

## 2021-08-01 NOTE — Discharge Instructions (Addendum)
-   No need to take molnupiravir  - Dexamethasone x 9 more days - 2LPM O2 with activity and PRN if sats low during day - Levemir 20 units BID while on dexamethasone then go back to 10 units BID - Check in with PCP in 1-2 weeks and get repeat EKG, if persistent Afib need to consider anticoagulation - Hope to see you back at work soon!

## 2021-08-01 NOTE — Progress Notes (Signed)
08/01/2021  I have seen and evaluated the patient for COVID pneumonia.  S:  No events.  Slept poorly.  Feeling better. On 2LPM, desats without. In afib on tele, new.  O: Blood pressure (!) 174/87, pulse 85, temperature 99 F (37.2 C), temperature source Oral, resp. rate 18, weight 123.2 kg, SpO2 94 %.  No distress Lungs more clear Heart sounds irregular Good insight No peripheral edema  Sugars up Minimally elevated inflammatory markers Pct neg  A:  COVID PNA DM with hyperglycemia Reactive Afib OSA on CPAP  P:  Steroids, IS, wean O2 for sats >90% Increase levemir, continue SSI Mag/K Will check on this afternoon and see if wants to stay here another day or recover at home; will order desat screen as will likely need O2 PRN at home with exertion  Erskine Emery MD [facility] [place] epic messenger for cross cover needs If after hours, please call E-link

## 2021-08-01 NOTE — Progress Notes (Signed)
°  Transition of Care [facility]) Screening Note   Patient Details  Name: Rodney Hayes Date of Birth: [birth year]   Transition of Care [facility]) CM/SW Contact:    Cyndi Bender, RN Phone Number: 08/01/2021, 8:45 AM    Transition of Care Department [facility]) has reviewed patient and no TOC needs have been identified at this time. We will continue to monitor patient advancement through interdisciplinary progression rounds. If new patient transition needs arise, please place a TOC consult.

## 2021-08-01 NOTE — Progress Notes (Signed)
Inpatient Diabetes Program Recommendations  AACE/ADA: New Consensus Statement on Inpatient Glycemic Control (2015)  Target Ranges:  Prepandial:   less than 140 mg/dL      Peak postprandial:   less than 180 mg/dL (1-2 hours)      Critically ill patients:  140 - 180 mg/dL   Lab Results  Component Value Date   GLUCAP 361 (H) 08/01/2021   HGBA1C 12.4 (H) 06/25/2021    Review of Glycemic Control  Latest Reference Range & Units 07/31/21 23:40 08/01/21 05:52 08/01/21 07:51  Glucose-Capillary 70 - 99 mg/dL 262 (H) 260 (H) 361 (H)  (H): Data is abnormally high  Diabetes history: DM2 Outpatient Diabetes medications: Lantus 20 units QD Current orders for Inpatient glycemic control: Levemir 30 BID, Novolog 0-20 units Q4H, Solumedrol 40 mg Q12H  Reviewed patient's current A1c of 12.4% (average glucose of 309 mg/dL).  He states he was started on Lantus back in December.  He has done intermittent fasting in the past and was able to come off of DM medications.  He plans to start fasting again.  Denies hypoglycemia and is aware how to treat.    Will continue to follow while inpatient.  Thank you, Reche Dixon, MSN, RN Diabetes Coordinator Inpatient Diabetes Program [phone] (team pager from 8a-5p)

## 2021-08-01 NOTE — Discharge Summary (Signed)
Physician Discharge Summary  ° °  ° ° ° ° °Patient ID: °Rodney Hayes °MRN: [id number] °DOB/AGE: 73/03/1951 73 y.o. ° °Admit date: 07/31/2021 °Discharge date: 08/01/2021 ° °Discharge Diagnoses:   ° °COVID PNA °DM with hyperglycemia °Reactive Afib  °OSA on CPAP °Hypertension ° °Discharge summary   ° °71 year old male with hx of OSA, HTN, and DM. Non-smoker. Works as Acute Care NP. Was in [employer] until 1/17 while at work suddenly had fairly acute onset of fever and chills. Went home fearing sick exposure. Home Covid tests + times 2. Went home around 11am. From time he arrived home to approx 5pm had progressive shortness of breath, dry cough, felt chest tightness and that "just couldn't get his breath".  No hemoptysis, leg swelling, or N/V.  Home pulse ox mid-80s. Tried his home CPAP which helped get sats > 90 but given rapid progression of sxs presented for admission. He was started on supplemental O2, IV steroids, and ongoing pulmonary hygiene.  Labs noted for negative PCT, normal CBC, and glucose 255.  He did well overnight without desaturations.  Additionally noted to be in new afib felt reactive to current illness.  Anticoagulation deferred at this time and he will follow-up with his PCP.  He is to be discharged home on home O2 and steroids.   ° °Discharge Plan by Active Problems   ° ° °COVID PNA °- saturations stable overnight with no desaturations.  Going home on home O2, currently at 2L  °- continue decadron 6mg for 8 more days starting 1/19 ° ° °DM with hyperglycemia °- continue home SSI and levemir (increase home levemir to 30 units BID while on steroids) ° ° °Reactive Afib  °- secondary to hypoxia/ COVID PNA °- deferred AC at this time.  Will monitor at home and f/u with PCP if ongoing for AC ° ° °OSA on CPAP °- prn and qHS CPAP  ° ° °Hypertension °- resume home losartan, HCTZ, norvasc  ° °Significant Hospital tests/ studies  ° °Procedures   ° °Culture data/antimicrobials   °  °Consults  ° °  ° °Discharge  Exam: °BP (!) 174/87 (BP Location: Left Arm)    Pulse 85    Temp 99 °F (37.2 °C) (Oral)    Resp 18    Wt 123.2 kg    SpO2 94%    BMI 37.88 kg/m²  ° °No distress °Lungs more clear °Heart sounds irregular °Good insight °No peripheral edema ° °Labs at discharge  ° °Lab Results  °Component Value Date  ° CREATININE 0.78 07/31/2021  ° BUN 15 07/31/2021  ° NA 135 07/31/2021  ° K 3.7 07/31/2021  ° CL 95 (L) 07/31/2021  ° CO2 29 07/31/2021  ° °Lab Results  °Component Value Date  ° WBC 6.7 07/31/2021  ° HGB 13.4 07/31/2021  ° HCT 39.8 07/31/2021  ° MCV 93.9 07/31/2021  ° PLT 170 07/31/2021  ° °Lab Results  °Component Value Date  ° ALT 39 07/31/2021  ° AST 29 07/31/2021  ° ALKPHOS 68 07/31/2021  ° BILITOT 0.9 07/31/2021  ° °Lab Results  °Component Value Date  ° INR 0.91 01/06/2013  ° INR 0.9 12/17/2006  ° ° °Current radiological studies   ° °DG Chest Port 1 View ° °Result Date: 07/31/2021 °CLINICAL DATA:  COVID positive, ARDS EXAM: PORTABLE CHEST 1 VIEW COMPARISON:  01/14/2017 FINDINGS: Two frontal views of the chest demonstrate mild enlargement the cardiac silhouette, likely due to AP projection. Basilar predominant interstitial and ground-glass   opacities are compatible with given history of COVID-19. Pulmonary edema could give a similar pattern. No effusion or pneumothorax. No acute bony abnormalities. IMPRESSION: 1. Basilar predominant interstitial and ground-glass opacities, favor COVID-19 pneumonia over edema. Electronically Signed   By: Michael  Brown M.D.   On: 07/31/2021 17:45   ° °Disposition:  ° ° Discharge disposition: 01-Home or Self Care ° ° ° ° °Home  ° ° ° °Allergies as of 08/01/2021   ° °   Reactions  ° Cymbalta [duloxetine Hcl]   ° As per e-mail 12/13/11: Insomnia, lethargy, inability to ejaculate  ° Tetracycline   ° REACTION: PHOTOSENSITIVITY  ° °  ° °  °Medication List  °  ° °STOP taking these medications   ° °Lagevrio 200 MG Caps capsule °Generic drug: molnupiravir EUA °  ° °  ° °TAKE these medications    ° °Accu-Chek FastClix Lancet Kit °Use as directed °  °amLODipine 5 MG tablet °Commonly known as: NORVASC °Take 1 tablet (5 mg total) by mouth daily. °  °Carestart COVID-19 Home Test Kit °Generic drug: COVID-19 At Home Antigen Test °Use as directed within package instructions °  °dexamethasone 2 MG tablet °Commonly known as: DECADRON °Take 3 tablets (6 mg total) by mouth daily. °  °freestyle lancets °Use as instructed qid °  °FREESTYLE LITE test strip °Generic drug: glucose blood °Use as directed four times daily °  °glucose monitoring kit monitoring kit °1 each by Does not apply route as needed for other. °  °hydrochlorothiazide 25 MG tablet °Commonly known as: HYDRODIURIL °Take 1 tablet (25 mg total) by mouth daily. °  °insulin detemir 100 UNIT/ML FlexPen °Commonly known as: LEVEMIR °Inject 20 Units into the skin 2 (two) times daily. °What changed:  °how much to take °how to take this °when to take this °additional instructions °  °losartan 100 MG tablet °Commonly known as: COZAAR °Take 1 tablet (100 mg total) by mouth daily. °  °psyllium 58.6 % packet °Commonly known as: METAMUCIL °Take 1 packet by mouth daily. °  °Sodium Fluoride 5000 PPM 1.1 % Pste °Generic drug: Sodium Fluoride °Use as directed on package °  °tadalafil 20 MG tablet °Commonly known as: CIALIS °Take 1 tablet (20 mg total) by mouth as needed as directed °  °traMADol 50 MG tablet °Commonly known as: ULTRAM °Take 1 tablet by mouth three to four times daily as needed °  °Unifine Pentips 31G X 5 MM Misc °Generic drug: Insulin Pen Needle °use as directed at bedtime °  °Vascepa 1 g capsule °Generic drug: icosapent Ethyl °Take 2 capsules (2 g total) by mouth 2 (two) times daily. °  °Vitamin D3 50 MCG (2000 UT) capsule °Take 1 capsule (2,000 Units total) by mouth daily. °  °zolpidem 5 MG tablet °Commonly known as: AMBIEN °Take 1 tablet (5 mg total) by mouth at bedtime as needed for sleep. °  ° °  ° °  °  ° ° °  °Durable Medical Equipment  °(From  admission, onward)  °  ° ° °  ° °  Start     Ordered  ° 08/01/21 1045  For home use only DME oxygen  Once       °Question Answer Comment  °Length of Need 6 Months   °Liters per Minute 2   °Oxygen delivery system Gas   °  ° 08/01/21 1044  ° 08/01/21 0818  For home use only DME oxygen  Once       °Question Answer   Comment  Length of Need 6 Months   Mode or (Route) Nasal cannula   Oxygen delivery system Gas      08/01/21 0817             Follow-up instructions/ appointment   - No need to take molnupiravir  - Dexamethasone x 9 more days - 2LPM O2 with activity and PRN if sats low during day - Levemir 20 units BID while on dexamethasone then go back to 10 units BID - Check in with PCP in 1-2 weeks and get repeat EKG, if persistent Afib need to consider anticoagulation - Hope to see you back at work soon!  Discharge Condition:    stable  Erskine Emery MD PCCM

## 2021-08-01 NOTE — Plan of Care (Signed)

## 2021-08-01 NOTE — TOC Transition Note (Addendum)
Transition of Care [facility]) - CM/SW Discharge Note   Patient Details  Name: LEWIN PELLOW MRN: [id number] Date of Birth: 1950/11/08  Transition of Care [facility]) CM/SW Contact:  Cyndi Bender, RN Phone Number: 08/01/2021, 11:02 AM   Clinical Narrative:    Patient stable for discharge. Orders for home 02. Patient agreeable to use [employer] for home 02 needs. Spoke to [place] with [employer] and portable 02 will be delivered to the room. Wife will transport home Address, Phone number and PCP verified.    Final next level of care: Home/Self Care Barriers to Discharge: Barriers Resolved   Patient Goals and CMS Choice Patient states their goals for this hospitalization and ongoing recovery are:: return home   Choice offered to / list presented to : Patient  Discharge Placement                   home    Discharge Plan and Services                DME Arranged: Oxygen DME Agency: [employer] Date DME Agency Contacted: 08/01/21 Time DME Agency Contacted: 79 Representative spoke with at DME Agency: [place] ([place]) Interventions     Readmission Risk Interventions Readmission Risk Prevention Plan 08/01/2021  Post Dischage Appt Complete  Medication Screening Complete  Transportation Screening Complete  Some recent data might be hidden

## 2021-08-01 NOTE — OR Nursing (Signed)
Patient O2 while resting: 92% Patient O2 while ambulating with no oxygen: 86% Patient O2 while ambulating with 2L oxygen: 94%

## 2021-08-02 ENCOUNTER — Telehealth: Payer: Self-pay

## 2021-08-02 DIAGNOSIS — R269 Unspecified abnormalities of gait and mobility: Secondary | ICD-10-CM | POA: Diagnosis not present

## 2021-08-02 DIAGNOSIS — G4733 Obstructive sleep apnea (adult) (pediatric): Secondary | ICD-10-CM | POA: Diagnosis not present

## 2021-08-02 DIAGNOSIS — U071 COVID-19: Secondary | ICD-10-CM | POA: Diagnosis not present

## 2021-08-02 NOTE — Telephone Encounter (Signed)
Transition Care Management Follow-up Telephone Call Date of discharge and from where: [place] 08-01-21 Dx: COVID Pneumonia How have you been since you were released from the hospital? Feeling better  Any questions or concerns? No  Items Reviewed: Did the pt receive and understand the discharge instructions provided? Yes  Medications obtained and verified? Yes  Other? No  Any new allergies since your discharge? No  Dietary orders reviewed? Yes Do you have support at home? Yes   Home Care and Equipment/Supplies: Were home health services ordered? no If so, what is the name of the agency?na   Has the agency set up a time to come to the patient's home? no Were any new equipment or medical supplies ordered?  Yes: oxygen  2L What is the name of the medicalL supply agency? [facility]  Were you able to get the supplies/equipment? yes Do you have any questions related to the use of the equipment or supplies? No  Functional Questionnaire: (I = Independent and D = Dependent) ADLs: I  Bathing/Dressing- I  Meal Prep- I  Eating- I  Maintaining continence- I  Transferring/Ambulation- I  Managing Meds- I  Follow up appointments reviewed:  PCP Hospital f/u appt confirmed? Yes  Scheduled to see Dr Alain Marion on 08-13-21 @ [facility] f/u appt confirmed? No . Are transportation arrangements needed? no If their condition worsens, is the pt aware to call PCP or go to the Emergency Dept.? Yes Was the patient provided with contact information for the PCP's office or ED? Yes Was to pt encouraged to call back with questions or concerns? Yes

## 2021-08-04 NOTE — Telephone Encounter (Signed)
Noted. Thanks.

## 2021-08-06 ENCOUNTER — Telehealth: Payer: Self-pay | Admitting: *Deleted

## 2021-08-06 DIAGNOSIS — U071 COVID-19: Secondary | ICD-10-CM

## 2021-08-06 DIAGNOSIS — J1282 Pneumonia due to coronavirus disease 2019: Secondary | ICD-10-CM

## 2021-08-06 NOTE — Telephone Encounter (Signed)
Referral placed to d/c o2./

## 2021-08-06 NOTE — Telephone Encounter (Signed)
-----   Message from Erick Colace, NP sent at 08/06/2021 12:04 PM EST ----- Regarding: DME Guys  Will you please send a order to [facility] Oxygen supply to cancel his oxygen   Thanks  Pete

## 2021-08-07 ENCOUNTER — Other Ambulatory Visit (HOSPITAL_COMMUNITY): Payer: Self-pay

## 2021-08-07 ENCOUNTER — Telehealth: Payer: Self-pay | Admitting: Internal Medicine

## 2021-08-07 DIAGNOSIS — J410 Simple chronic bronchitis: Secondary | ICD-10-CM

## 2021-08-07 MED ORDER — ADVAIR HFA 230-21 MCG/ACT IN AERO
2.0000 | INHALATION_SPRAY | Freq: Two times a day (BID) | RESPIRATORY_TRACT | 1 refills | Status: DC
Start: 2021-08-07 — End: 2021-08-07
  Filled 2021-08-07: qty 12, 30d supply, fill #0

## 2021-08-07 MED ORDER — AMOXICILLIN-POT CLAVULANATE 875-125 MG PO TABS
1.0000 | ORAL_TABLET | Freq: Two times a day (BID) | ORAL | 0 refills | Status: DC
  Filled 2021-08-07: qty 14, 7d supply, fill #0

## 2021-08-07 MED ORDER — BUDESONIDE-FORMOTEROL FUMARATE 160-4.5 MCG/ACT IN AERO
2.0000 | INHALATION_SPRAY | Freq: Two times a day (BID) | RESPIRATORY_TRACT | 1 refills | Status: DC
  Filled 2021-08-07: qty 10.2, 30d supply, fill #0

## 2021-08-07 MED ORDER — ALBUTEROL SULFATE HFA 108 (90 BASE) MCG/ACT IN AERS
2.0000 | INHALATION_SPRAY | Freq: Four times a day (QID) | RESPIRATORY_TRACT | 2 refills | Status: AC | PRN
  Filled 2021-08-07: qty 8.5, 25d supply, fill #0
  Filled 2022-07-09: qty 6.7, 25d supply, fill #1

## 2021-08-07 MED ORDER — FLUTICASONE-SALMETEROL 500-50 MCG/ACT IN AEPB
1.0000 | INHALATION_SPRAY | Freq: Two times a day (BID) | RESPIRATORY_TRACT | 2 refills | Status: DC
  Filled 2021-08-07: qty 60, 30d supply, fill #0
  Filled 2021-10-01: qty 60, 30d supply, fill #1
  Filled 2022-04-02: qty 60, 30d supply, fill #2

## 2021-08-07 NOTE — Addendum Note (Signed)
Addended by: Ina Homes on: 08/07/2021 09:25 AM   Modules accepted: Orders

## 2021-08-07 NOTE — Telephone Encounter (Signed)
Bronchitis post covid, scripts written.

## 2021-08-08 ENCOUNTER — Telehealth: Payer: Self-pay

## 2021-08-08 DIAGNOSIS — U099 Post covid-19 condition, unspecified: Secondary | ICD-10-CM

## 2021-08-08 NOTE — Telephone Encounter (Signed)
Do you know what he recovers to on the 2 liters O2?

## 2021-08-08 NOTE — Telephone Encounter (Signed)
-----   Message from Erick Colace, NP sent at 08/08/2021  8:43 AM EST ----- Regarding: resume oxygen order Oxygen need  Oxygen 2 liters at HS and PRN   Indication Ambulatory pulse ox 86%  Laurey Arrow

## 2021-08-13 ENCOUNTER — Other Ambulatory Visit (HOSPITAL_COMMUNITY): Payer: Self-pay

## 2021-08-13 ENCOUNTER — Ambulatory Visit: Payer: 59 | Admitting: Internal Medicine

## 2021-08-13 ENCOUNTER — Encounter: Payer: Self-pay | Admitting: Internal Medicine

## 2021-08-13 ENCOUNTER — Other Ambulatory Visit: Payer: Self-pay

## 2021-08-13 DIAGNOSIS — E1142 Type 2 diabetes mellitus with diabetic polyneuropathy: Secondary | ICD-10-CM | POA: Diagnosis not present

## 2021-08-13 DIAGNOSIS — U099 Post covid-19 condition, unspecified: Secondary | ICD-10-CM | POA: Diagnosis not present

## 2021-08-13 DIAGNOSIS — E1165 Type 2 diabetes mellitus with hyperglycemia: Secondary | ICD-10-CM | POA: Diagnosis not present

## 2021-08-13 DIAGNOSIS — F439 Reaction to severe stress, unspecified: Secondary | ICD-10-CM | POA: Diagnosis not present

## 2021-08-13 DIAGNOSIS — U071 COVID-19: Secondary | ICD-10-CM

## 2021-08-13 MED ORDER — INSULIN GLARGINE-YFGN 100 UNIT/ML ~~LOC~~ SOPN
20.0000 [IU] | PEN_INJECTOR | Freq: Two times a day (BID) | SUBCUTANEOUS | 11 refills | Status: DC
Start: 2021-08-13 — End: 2022-08-27
  Filled 2021-08-13: qty 12, 30d supply, fill #0
  Filled 2021-09-09: qty 12, 30d supply, fill #1
  Filled 2021-10-03: qty 12, 30d supply, fill #2
  Filled 2022-05-06: qty 12, 30d supply, fill #3
  Filled 2022-06-24: qty 12, 30d supply, fill #4
  Filled 2022-07-09 – 2022-07-22 (×3): qty 12, 30d supply, fill #5

## 2021-08-13 MED ORDER — INSULIN GLARGINE-YFGN 100 UNIT/ML ~~LOC~~ SOLN
20.0000 [IU] | Freq: Two times a day (BID) | SUBCUTANEOUS | 11 refills | Status: DC
  Filled 2021-08-13: qty 10, 25d supply, fill #0

## 2021-08-13 NOTE — Assessment & Plan Note (Addendum)
S/p ARDS/pneumonia Finished Augmentin, steroids Cont w/Advair, Albuterol

## 2021-08-13 NOTE — Assessment & Plan Note (Signed)
Severe fatigue Treat DM Rest more
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
## 2021-08-13 NOTE — Assessment & Plan Note (Signed)
On Insulin

## 2021-08-13 NOTE — Assessment & Plan Note (Addendum)
Change to [facility] pen due to cost - see Rx

## 2021-08-15 ENCOUNTER — Encounter: Payer: Self-pay | Admitting: Internal Medicine

## 2021-08-15 DIAGNOSIS — U099 Post covid-19 condition, unspecified: Secondary | ICD-10-CM

## 2021-08-16 ENCOUNTER — Other Ambulatory Visit: Payer: Self-pay | Admitting: Internal Medicine

## 2021-08-16 ENCOUNTER — Other Ambulatory Visit (HOSPITAL_COMMUNITY): Payer: Self-pay

## 2021-08-16 MED ORDER — LEVALBUTEROL HCL 0.63 MG/3ML IN NEBU
0.6300 mg | INHALATION_SOLUTION | RESPIRATORY_TRACT | 5 refills | Status: AC | PRN
  Filled 2021-08-16: qty 180, 10d supply, fill #0

## 2021-08-16 MED ORDER — CARESTART COVID-19 HOME TEST VI KIT
PACK | 0 refills | Status: DC
  Filled 2021-08-16: qty 4, 4d supply, fill #0

## 2021-08-16 NOTE — Telephone Encounter (Signed)
Patient calling in  Patient wanted to make sure rx was sent to [facility] for Nebulizer machine  Please let patient know if rx has been sent [phone]

## 2021-08-17 NOTE — Telephone Encounter (Signed)
Called pt to verify fax # to [facility].. Pt states he doesn't have fax # and he was tired of waiting so he went out and bought one. Does not need after all.Marland KitchenJohny Chess

## 2021-08-21 ENCOUNTER — Other Ambulatory Visit: Payer: Self-pay | Admitting: Internal Medicine

## 2021-08-21 ENCOUNTER — Other Ambulatory Visit (HOSPITAL_COMMUNITY): Payer: Self-pay

## 2021-08-22 ENCOUNTER — Other Ambulatory Visit (HOSPITAL_COMMUNITY): Payer: Self-pay

## 2021-08-23 ENCOUNTER — Other Ambulatory Visit (HOSPITAL_COMMUNITY): Payer: Self-pay

## 2021-08-23 MED ORDER — UNIFINE PENTIPS 31G X 5 MM MISC
3 refills | Status: DC
  Filled 2021-08-23 – 2021-10-17 (×2): qty 100, 90d supply, fill #0
  Filled 2022-04-02: qty 100, 90d supply, fill #1
  Filled 2022-07-10: qty 100, 90d supply, fill #2

## 2021-08-24 ENCOUNTER — Other Ambulatory Visit (HOSPITAL_COMMUNITY): Payer: Self-pay

## 2021-09-03 ENCOUNTER — Other Ambulatory Visit (HOSPITAL_COMMUNITY): Payer: Self-pay

## 2021-09-10 ENCOUNTER — Other Ambulatory Visit (HOSPITAL_COMMUNITY): Payer: Self-pay

## 2021-10-01 ENCOUNTER — Other Ambulatory Visit (HOSPITAL_COMMUNITY): Payer: Self-pay

## 2021-10-03 ENCOUNTER — Other Ambulatory Visit (HOSPITAL_COMMUNITY): Payer: Self-pay

## 2021-10-17 ENCOUNTER — Other Ambulatory Visit (HOSPITAL_COMMUNITY): Payer: Self-pay

## 2021-10-30 DIAGNOSIS — E113292 Type 2 diabetes mellitus with mild nonproliferative diabetic retinopathy without macular edema, left eye: Secondary | ICD-10-CM | POA: Diagnosis not present

## 2021-10-30 DIAGNOSIS — H11153 Pinguecula, bilateral: Secondary | ICD-10-CM | POA: Diagnosis not present

## 2021-10-30 DIAGNOSIS — H25813 Combined forms of age-related cataract, bilateral: Secondary | ICD-10-CM | POA: Diagnosis not present

## 2021-11-08 ENCOUNTER — Other Ambulatory Visit (HOSPITAL_COMMUNITY): Payer: Self-pay

## 2021-11-26 ENCOUNTER — Other Ambulatory Visit (HOSPITAL_COMMUNITY): Payer: Self-pay

## 2021-12-24 ENCOUNTER — Other Ambulatory Visit (HOSPITAL_COMMUNITY): Payer: Self-pay

## 2021-12-31 ENCOUNTER — Other Ambulatory Visit (HOSPITAL_COMMUNITY): Payer: Self-pay

## 2022-01-01 DIAGNOSIS — M5416 Radiculopathy, lumbar region: Secondary | ICD-10-CM | POA: Diagnosis not present

## 2022-01-01 DIAGNOSIS — M25652 Stiffness of left hip, not elsewhere classified: Secondary | ICD-10-CM | POA: Diagnosis not present

## 2022-01-01 DIAGNOSIS — M9904 Segmental and somatic dysfunction of sacral region: Secondary | ICD-10-CM | POA: Diagnosis not present

## 2022-01-01 DIAGNOSIS — M7918 Myalgia, other site: Secondary | ICD-10-CM | POA: Diagnosis not present

## 2022-01-01 DIAGNOSIS — M5442 Lumbago with sciatica, left side: Secondary | ICD-10-CM | POA: Diagnosis not present

## 2022-01-01 DIAGNOSIS — M9905 Segmental and somatic dysfunction of pelvic region: Secondary | ICD-10-CM | POA: Diagnosis not present

## 2022-01-01 DIAGNOSIS — M9903 Segmental and somatic dysfunction of lumbar region: Secondary | ICD-10-CM | POA: Diagnosis not present

## 2022-01-08 DIAGNOSIS — M5416 Radiculopathy, lumbar region: Secondary | ICD-10-CM | POA: Diagnosis not present

## 2022-01-08 DIAGNOSIS — M7918 Myalgia, other site: Secondary | ICD-10-CM | POA: Diagnosis not present

## 2022-01-08 DIAGNOSIS — M9904 Segmental and somatic dysfunction of sacral region: Secondary | ICD-10-CM | POA: Diagnosis not present

## 2022-01-08 DIAGNOSIS — M9905 Segmental and somatic dysfunction of pelvic region: Secondary | ICD-10-CM | POA: Diagnosis not present

## 2022-01-08 DIAGNOSIS — M9903 Segmental and somatic dysfunction of lumbar region: Secondary | ICD-10-CM | POA: Diagnosis not present

## 2022-01-08 DIAGNOSIS — M5442 Lumbago with sciatica, left side: Secondary | ICD-10-CM | POA: Diagnosis not present

## 2022-01-08 DIAGNOSIS — M25652 Stiffness of left hip, not elsewhere classified: Secondary | ICD-10-CM | POA: Diagnosis not present

## 2022-01-29 DIAGNOSIS — M9905 Segmental and somatic dysfunction of pelvic region: Secondary | ICD-10-CM | POA: Diagnosis not present

## 2022-01-29 DIAGNOSIS — M7918 Myalgia, other site: Secondary | ICD-10-CM | POA: Diagnosis not present

## 2022-01-29 DIAGNOSIS — M9904 Segmental and somatic dysfunction of sacral region: Secondary | ICD-10-CM | POA: Diagnosis not present

## 2022-01-29 DIAGNOSIS — M9903 Segmental and somatic dysfunction of lumbar region: Secondary | ICD-10-CM | POA: Diagnosis not present

## 2022-01-29 DIAGNOSIS — M25652 Stiffness of left hip, not elsewhere classified: Secondary | ICD-10-CM | POA: Diagnosis not present

## 2022-01-29 DIAGNOSIS — M5416 Radiculopathy, lumbar region: Secondary | ICD-10-CM | POA: Diagnosis not present

## 2022-01-29 DIAGNOSIS — M5442 Lumbago with sciatica, left side: Secondary | ICD-10-CM | POA: Diagnosis not present

## 2022-02-12 DIAGNOSIS — M7918 Myalgia, other site: Secondary | ICD-10-CM | POA: Diagnosis not present

## 2022-02-12 DIAGNOSIS — M25652 Stiffness of left hip, not elsewhere classified: Secondary | ICD-10-CM | POA: Diagnosis not present

## 2022-02-12 DIAGNOSIS — M9904 Segmental and somatic dysfunction of sacral region: Secondary | ICD-10-CM | POA: Diagnosis not present

## 2022-02-12 DIAGNOSIS — M9905 Segmental and somatic dysfunction of pelvic region: Secondary | ICD-10-CM | POA: Diagnosis not present

## 2022-02-12 DIAGNOSIS — M9903 Segmental and somatic dysfunction of lumbar region: Secondary | ICD-10-CM | POA: Diagnosis not present

## 2022-02-12 DIAGNOSIS — M5442 Lumbago with sciatica, left side: Secondary | ICD-10-CM | POA: Diagnosis not present

## 2022-02-12 DIAGNOSIS — M5416 Radiculopathy, lumbar region: Secondary | ICD-10-CM | POA: Diagnosis not present

## 2022-02-18 ENCOUNTER — Other Ambulatory Visit: Payer: Self-pay | Admitting: Internal Medicine

## 2022-02-20 ENCOUNTER — Telehealth: Payer: Self-pay

## 2022-02-20 ENCOUNTER — Other Ambulatory Visit (HOSPITAL_COMMUNITY): Payer: Self-pay

## 2022-02-20 MED ORDER — TRAMADOL HCL 50 MG PO TABS
50.0000 mg | ORAL_TABLET | ORAL | 3 refills | Status: DC
  Filled 2022-02-20: qty 120, 30d supply, fill #0
  Filled 2022-04-02: qty 120, 30d supply, fill #1
  Filled 2022-05-09: qty 120, 30d supply, fill #2
  Filled 2022-07-10: qty 120, 30d supply, fill #3

## 2022-02-20 NOTE — Telephone Encounter (Signed)
OK. Thx

## 2022-02-20 NOTE — Telephone Encounter (Signed)
Pt is needing a med refill for traMADol (ULTRAM) 50 MG tablet.  LOV: 08/13/2021

## 2022-02-22 DIAGNOSIS — C61 Malignant neoplasm of prostate: Secondary | ICD-10-CM | POA: Diagnosis not present

## 2022-02-22 DIAGNOSIS — N393 Stress incontinence (female) (male): Secondary | ICD-10-CM | POA: Diagnosis not present

## 2022-02-22 DIAGNOSIS — N5231 Erectile dysfunction following radical prostatectomy: Secondary | ICD-10-CM | POA: Diagnosis not present

## 2022-02-26 DIAGNOSIS — M9903 Segmental and somatic dysfunction of lumbar region: Secondary | ICD-10-CM | POA: Diagnosis not present

## 2022-02-26 DIAGNOSIS — M9905 Segmental and somatic dysfunction of pelvic region: Secondary | ICD-10-CM | POA: Diagnosis not present

## 2022-02-26 DIAGNOSIS — M25652 Stiffness of left hip, not elsewhere classified: Secondary | ICD-10-CM | POA: Diagnosis not present

## 2022-02-26 DIAGNOSIS — M7918 Myalgia, other site: Secondary | ICD-10-CM | POA: Diagnosis not present

## 2022-02-26 DIAGNOSIS — M9904 Segmental and somatic dysfunction of sacral region: Secondary | ICD-10-CM | POA: Diagnosis not present

## 2022-02-26 DIAGNOSIS — M5442 Lumbago with sciatica, left side: Secondary | ICD-10-CM | POA: Diagnosis not present

## 2022-02-26 DIAGNOSIS — M5416 Radiculopathy, lumbar region: Secondary | ICD-10-CM | POA: Diagnosis not present

## 2022-03-04 ENCOUNTER — Other Ambulatory Visit (HOSPITAL_COMMUNITY): Payer: Self-pay

## 2022-03-19 DIAGNOSIS — M9905 Segmental and somatic dysfunction of pelvic region: Secondary | ICD-10-CM | POA: Diagnosis not present

## 2022-03-19 DIAGNOSIS — M25652 Stiffness of left hip, not elsewhere classified: Secondary | ICD-10-CM | POA: Diagnosis not present

## 2022-03-19 DIAGNOSIS — M5442 Lumbago with sciatica, left side: Secondary | ICD-10-CM | POA: Diagnosis not present

## 2022-03-19 DIAGNOSIS — M9903 Segmental and somatic dysfunction of lumbar region: Secondary | ICD-10-CM | POA: Diagnosis not present

## 2022-03-19 DIAGNOSIS — M5416 Radiculopathy, lumbar region: Secondary | ICD-10-CM | POA: Diagnosis not present

## 2022-03-19 DIAGNOSIS — M9904 Segmental and somatic dysfunction of sacral region: Secondary | ICD-10-CM | POA: Diagnosis not present

## 2022-03-19 DIAGNOSIS — M7918 Myalgia, other site: Secondary | ICD-10-CM | POA: Diagnosis not present

## 2022-03-20 DIAGNOSIS — M6289 Other specified disorders of muscle: Secondary | ICD-10-CM | POA: Diagnosis not present

## 2022-03-20 DIAGNOSIS — M6281 Muscle weakness (generalized): Secondary | ICD-10-CM | POA: Diagnosis not present

## 2022-03-20 DIAGNOSIS — N3941 Urge incontinence: Secondary | ICD-10-CM | POA: Diagnosis not present

## 2022-04-02 ENCOUNTER — Other Ambulatory Visit: Payer: Self-pay | Admitting: Internal Medicine

## 2022-04-02 ENCOUNTER — Other Ambulatory Visit (HOSPITAL_COMMUNITY): Payer: Self-pay

## 2022-04-02 ENCOUNTER — Other Ambulatory Visit (HOSPITAL_COMMUNITY): Payer: Self-pay | Admitting: Pharmacist

## 2022-04-02 MED ORDER — AMLODIPINE BESYLATE 5 MG PO TABS
5.0000 mg | ORAL_TABLET | Freq: Every day | ORAL | 0 refills | Status: DC
  Filled 2022-04-02: qty 30, 30d supply, fill #0

## 2022-04-02 MED ORDER — ICOSAPENT ETHYL 1 G PO CAPS
2.0000 g | ORAL_CAPSULE | Freq: Two times a day (BID) | ORAL | 0 refills | Status: DC
Start: 2022-04-02 — End: 2022-05-09
  Filled 2022-04-02: qty 120, 30d supply, fill #0

## 2022-04-02 MED ORDER — LOSARTAN POTASSIUM 100 MG PO TABS
100.0000 mg | ORAL_TABLET | Freq: Every day | ORAL | 0 refills | Status: DC
  Filled 2022-04-02 – 2022-06-13 (×2): qty 30, 30d supply, fill #0

## 2022-04-02 MED ORDER — HYDROCHLOROTHIAZIDE 25 MG PO TABS
25.0000 mg | ORAL_TABLET | Freq: Every day | ORAL | 0 refills | Status: DC
  Filled 2022-04-02: qty 30, 30d supply, fill #0

## 2022-04-03 ENCOUNTER — Other Ambulatory Visit (HOSPITAL_COMMUNITY): Payer: Self-pay

## 2022-04-16 ENCOUNTER — Other Ambulatory Visit (HOSPITAL_COMMUNITY): Payer: Self-pay

## 2022-04-18 DIAGNOSIS — M5442 Lumbago with sciatica, left side: Secondary | ICD-10-CM | POA: Diagnosis not present

## 2022-04-18 DIAGNOSIS — M9904 Segmental and somatic dysfunction of sacral region: Secondary | ICD-10-CM | POA: Diagnosis not present

## 2022-04-18 DIAGNOSIS — M9905 Segmental and somatic dysfunction of pelvic region: Secondary | ICD-10-CM | POA: Diagnosis not present

## 2022-04-18 DIAGNOSIS — M9903 Segmental and somatic dysfunction of lumbar region: Secondary | ICD-10-CM | POA: Diagnosis not present

## 2022-04-18 DIAGNOSIS — M7918 Myalgia, other site: Secondary | ICD-10-CM | POA: Diagnosis not present

## 2022-04-18 DIAGNOSIS — M25652 Stiffness of left hip, not elsewhere classified: Secondary | ICD-10-CM | POA: Diagnosis not present

## 2022-04-18 DIAGNOSIS — M5416 Radiculopathy, lumbar region: Secondary | ICD-10-CM | POA: Diagnosis not present

## 2022-04-23 DIAGNOSIS — M6281 Muscle weakness (generalized): Secondary | ICD-10-CM | POA: Diagnosis not present

## 2022-04-23 DIAGNOSIS — M62838 Other muscle spasm: Secondary | ICD-10-CM | POA: Diagnosis not present

## 2022-04-23 DIAGNOSIS — N393 Stress incontinence (female) (male): Secondary | ICD-10-CM | POA: Diagnosis not present

## 2022-04-24 ENCOUNTER — Ambulatory Visit: Payer: 59 | Admitting: Internal Medicine

## 2022-05-01 ENCOUNTER — Other Ambulatory Visit: Payer: Self-pay

## 2022-05-01 MED ORDER — COVID-19 MRNA 2023-2024 VACCINE (COMIRNATY) 0.3 ML INJECTION
INTRAMUSCULAR | 0 refills | Status: DC
  Filled 2022-05-01: qty 0.3, 1d supply, fill #0

## 2022-05-06 ENCOUNTER — Other Ambulatory Visit (HOSPITAL_COMMUNITY): Payer: Self-pay

## 2022-05-07 DIAGNOSIS — M62838 Other muscle spasm: Secondary | ICD-10-CM | POA: Diagnosis not present

## 2022-05-07 DIAGNOSIS — M6281 Muscle weakness (generalized): Secondary | ICD-10-CM | POA: Diagnosis not present

## 2022-05-07 DIAGNOSIS — N393 Stress incontinence (female) (male): Secondary | ICD-10-CM | POA: Diagnosis not present

## 2022-05-09 ENCOUNTER — Other Ambulatory Visit: Payer: Self-pay | Admitting: Internal Medicine

## 2022-05-09 ENCOUNTER — Other Ambulatory Visit (HOSPITAL_COMMUNITY): Payer: Self-pay

## 2022-05-09 DIAGNOSIS — M7918 Myalgia, other site: Secondary | ICD-10-CM | POA: Diagnosis not present

## 2022-05-09 DIAGNOSIS — M25652 Stiffness of left hip, not elsewhere classified: Secondary | ICD-10-CM | POA: Diagnosis not present

## 2022-05-09 DIAGNOSIS — M9903 Segmental and somatic dysfunction of lumbar region: Secondary | ICD-10-CM | POA: Diagnosis not present

## 2022-05-09 DIAGNOSIS — M5416 Radiculopathy, lumbar region: Secondary | ICD-10-CM | POA: Diagnosis not present

## 2022-05-09 DIAGNOSIS — M9904 Segmental and somatic dysfunction of sacral region: Secondary | ICD-10-CM | POA: Diagnosis not present

## 2022-05-09 DIAGNOSIS — M9905 Segmental and somatic dysfunction of pelvic region: Secondary | ICD-10-CM | POA: Diagnosis not present

## 2022-05-09 DIAGNOSIS — M5442 Lumbago with sciatica, left side: Secondary | ICD-10-CM | POA: Diagnosis not present

## 2022-05-09 MED ORDER — AMLODIPINE BESYLATE 5 MG PO TABS
5.0000 mg | ORAL_TABLET | Freq: Every day | ORAL | 0 refills | Status: DC
  Filled 2022-05-09: qty 90, 90d supply, fill #0

## 2022-05-09 MED ORDER — ICOSAPENT ETHYL 1 G PO CAPS
2.0000 g | ORAL_CAPSULE | Freq: Two times a day (BID) | ORAL | 0 refills | Status: DC
Start: 2022-05-09 — End: 2022-10-22
  Filled 2022-05-09: qty 360, 90d supply, fill #0

## 2022-05-09 MED ORDER — HYDROCHLOROTHIAZIDE 25 MG PO TABS
25.0000 mg | ORAL_TABLET | Freq: Every day | ORAL | 0 refills | Status: DC
  Filled 2022-05-09: qty 90, 90d supply, fill #0

## 2022-05-10 ENCOUNTER — Other Ambulatory Visit (HOSPITAL_COMMUNITY): Payer: Self-pay

## 2022-05-14 ENCOUNTER — Other Ambulatory Visit (HOSPITAL_COMMUNITY): Payer: Self-pay

## 2022-05-14 ENCOUNTER — Other Ambulatory Visit: Payer: Self-pay | Admitting: Internal Medicine

## 2022-05-15 ENCOUNTER — Other Ambulatory Visit (HOSPITAL_COMMUNITY): Payer: Self-pay

## 2022-05-16 ENCOUNTER — Other Ambulatory Visit (HOSPITAL_COMMUNITY): Payer: Self-pay

## 2022-05-16 ENCOUNTER — Encounter: Payer: Self-pay | Admitting: Internal Medicine

## 2022-05-16 MED ORDER — FLUTICASONE-SALMETEROL 500-50 MCG/ACT IN AEPB
1.0000 | INHALATION_SPRAY | Freq: Two times a day (BID) | RESPIRATORY_TRACT | 2 refills | Status: DC
  Filled 2022-05-16 – 2022-05-24 (×2): qty 60, 30d supply, fill #0
  Filled 2022-07-10: qty 60, 30d supply, fill #1
  Filled 2022-10-22: qty 60, 30d supply, fill #2

## 2022-05-16 NOTE — Telephone Encounter (Signed)
Dr. Tamala Julian, please advise on refill

## 2022-05-17 ENCOUNTER — Telehealth: Payer: Self-pay | Admitting: Pulmonary Disease

## 2022-05-17 ENCOUNTER — Telehealth (INDEPENDENT_AMBULATORY_CARE_PROVIDER_SITE_OTHER): Payer: Self-pay | Admitting: Pulmonary Disease

## 2022-05-17 ENCOUNTER — Encounter: Payer: Self-pay | Admitting: Pulmonary Disease

## 2022-05-17 DIAGNOSIS — G4733 Obstructive sleep apnea (adult) (pediatric): Secondary | ICD-10-CM

## 2022-05-17 NOTE — Patient Instructions (Signed)
Will arrange for replacement CPAP supplies from [employer].

## 2022-05-17 NOTE — Progress Notes (Signed)
[facility]  Chief Complaint  Patient presents with   Follow-up    Sleep apnea    Past Surgical History:  He  has a past surgical history that includes Tonsillectomy and adenoidectomy; Umbilical hernia repair; left knee arthroscopy; Biceps tendon reinsertion; colonoscopy with polypectomy; Total knee arthroplasty (Left, 01/11/2013); Joint replacement (Left); Total shoulder arthroplasty (Left, 08/12/2014); Prostatectomy; Robot assisted laparoscopic radical prostatectomy (N/A, 01/23/2017); and Lymphadenectomy (Bilateral, 01/23/2017).  Past Medical History:  HTN, HLD, Hep B, DJD, DM, Prostate cancer, BPH, OA, COVID PNA January 2023  Constitutional:  There were no vitals taken for this visit.  Brief Summary:  Rodney Hayes is a 71 y.o. male with obstructive sleep apnea.      Subjective:  Virtual Visit via Video Note  I connected with Gavinn Collard Podoll on 05/17/22 at  by a video enabled telemedicine application and verified that I am speaking with the correct person using two identifiers.  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 The patient expressed understanding and agreed to proceed.  History of Present Illness:  He uses CPAP nightly.  Has Resmed mini.  No issues with pressure setting or mask fit.  Sleeps well.  Feels okay during the day.  Physical Exam:   Normal speech.  Pulmonary testing:    Chest Imaging:  CT chest 11/20/15 >> no change RLL 6 mm nodule, diffuse esophageal walk thickening, coronary calcification    Sleep Tests:  PSG 12/21/03 >> AHI 43, SpO2 low 88% CPAP 10/05/17 to 11/03/17 >> used on 28 of 30 nights with average 8 hrs 36 min.  Average AHI 0.4 with CPAP 14 cm H2O  Cardiac Tests:  Echo 08/17/15 >> EF 55 to 60%, mild AS, PAS 32 mmHg   Social History:  He  reports that he quit smoking about 29  years ago. His smoking use included cigarettes. He started smoking about 59 years ago. He has a 60.00 pack-year smoking history. He has never used smokeless tobacco. He reports current alcohol use of about 4.0 standard drinks of alcohol per week. He reports that he does not use drugs.  Family History:  His family history includes Alcohol abuse in his mother; Cancer in his father; Coronary artery disease in his father; Diabetes in his father and mother; Heart attack in his father; Lung cancer in his mother.     Assessment/Plan:   Obstructive sleep apnea. - he is compliant with CPAP and reports benefit from therapy - he uses Resmed mini CPAP - will send order to get replacement supplies from [employer]  Follow up:   Patient Instructions  Will arrange for replacement CPAP supplies from [employer].  Medication List:   Allergies as of 05/17/2022       Reactions   Cymbalta [duloxetine Hcl]    As per e-mail 12/13/11: Insomnia, lethargy, inability to ejaculate   Tetracycline    REACTION: PHOTOSENSITIVITY        Medication List        Accurate as of May 17, 2022  5:31 PM. If you have any questions, ask your nurse or doctor.          Accu-Chek [employer] Kit Use as directed   albuterol 108 (90 Base) MCG/ACT inhaler Commonly known as: VENTOLIN HFA Inhale 2 puffs into the lungs every 6 (six) hours as needed for wheezing or shortness of breath.  amLODipine 5 MG tablet Commonly known as: NORVASC Take 1 tablet (5 mg total) by mouth daily. Must keep February for future refills   Carestart COVID-19 Home Test Kit Generic drug: COVID-19 At Home Antigen Test Use as directed   Comirnaty Susp injection Generic drug: COVID-19 mRNA Vac-TriS (Pfizer) Inject into the muscle.   fluticasone-salmeterol 500-50 MCG/ACT Aepb Commonly known as: Advair Diskus Inhale 1 puff into the lungs 2 (two) times daily.   freestyle lancets Use as instructed qid   FREESTYLE LITE test  strip Generic drug: glucose blood Use as directed four times daily   glucose monitoring kit monitoring kit 1 each by Does not apply route as needed for other.   hydrochlorothiazide 25 MG tablet Commonly known as: HYDRODIURIL Take 1 tablet (25 mg total) by mouth daily. Must keep February for future refills   levalbuterol 0.63 MG/3ML nebulizer solution Commonly known as: Xopenex Take 3 mLs (0.63 mg total) by nebulization every 4 (four) hours as needed for wheezing or shortness of breath.   losartan 100 MG tablet Commonly known as: COZAAR Take 1 tablet (100 mg total) by mouth daily. Annual appt due in Sept must see provider for future refills   psyllium 58.6 % packet Commonly known as: METAMUCIL Take 1 packet by mouth daily.   Semglee (yfgn) 100 UNIT/ML Pen Generic drug: insulin glargine-yfgn Inject 20 Units into the skin 2 (two) times daily.   Sodium Fluoride 5000 PPM 1.1 % Pste Generic drug: Sodium Fluoride Use as directed on package   tadalafil 20 MG tablet Commonly known as: CIALIS Take 1 tablet (20 mg total) by mouth as needed as directed   traMADol 50 MG tablet Commonly known as: ULTRAM Take 1 tablet by mouth three to four times daily as needed   Unifine Pentips 31G X 5 MM Misc Generic drug: Insulin Pen Needle use as directed at bedtime   Vascepa 1 g capsule Generic drug: icosapent Ethyl Take 2 capsules (2 g total) by mouth 2 (two) times daily. Must keep February for future refills   Vitamin D3 50 MCG (2000 UT) capsule Take 1 capsule (2,000 Units total) by mouth daily.        Signature:  Chesley Mires, MD [place] Pager - [phone] 05/17/2022, 5:31 PM

## 2022-05-17 NOTE — Telephone Encounter (Signed)
I have sent order to [facility] to arrange for new CPAP supplies.

## 2022-05-21 DIAGNOSIS — N393 Stress incontinence (female) (male): Secondary | ICD-10-CM | POA: Diagnosis not present

## 2022-05-21 DIAGNOSIS — M62838 Other muscle spasm: Secondary | ICD-10-CM | POA: Diagnosis not present

## 2022-05-21 DIAGNOSIS — M6281 Muscle weakness (generalized): Secondary | ICD-10-CM | POA: Diagnosis not present

## 2022-05-24 ENCOUNTER — Other Ambulatory Visit (HOSPITAL_COMMUNITY): Payer: Self-pay

## 2022-05-30 DIAGNOSIS — M25652 Stiffness of left hip, not elsewhere classified: Secondary | ICD-10-CM | POA: Diagnosis not present

## 2022-05-30 DIAGNOSIS — M7918 Myalgia, other site: Secondary | ICD-10-CM | POA: Diagnosis not present

## 2022-05-30 DIAGNOSIS — M9904 Segmental and somatic dysfunction of sacral region: Secondary | ICD-10-CM | POA: Diagnosis not present

## 2022-05-30 DIAGNOSIS — M5442 Lumbago with sciatica, left side: Secondary | ICD-10-CM | POA: Diagnosis not present

## 2022-05-30 DIAGNOSIS — M5416 Radiculopathy, lumbar region: Secondary | ICD-10-CM | POA: Diagnosis not present

## 2022-05-30 DIAGNOSIS — M9903 Segmental and somatic dysfunction of lumbar region: Secondary | ICD-10-CM | POA: Diagnosis not present

## 2022-05-30 DIAGNOSIS — M9905 Segmental and somatic dysfunction of pelvic region: Secondary | ICD-10-CM | POA: Diagnosis not present

## 2022-06-13 ENCOUNTER — Other Ambulatory Visit (HOSPITAL_COMMUNITY): Payer: Self-pay

## 2022-06-24 ENCOUNTER — Other Ambulatory Visit: Payer: Self-pay

## 2022-06-24 ENCOUNTER — Other Ambulatory Visit (HOSPITAL_COMMUNITY): Payer: Self-pay

## 2022-06-25 DIAGNOSIS — M62838 Other muscle spasm: Secondary | ICD-10-CM | POA: Diagnosis not present

## 2022-06-25 DIAGNOSIS — N393 Stress incontinence (female) (male): Secondary | ICD-10-CM | POA: Diagnosis not present

## 2022-06-25 DIAGNOSIS — M6281 Muscle weakness (generalized): Secondary | ICD-10-CM | POA: Diagnosis not present

## 2022-06-27 ENCOUNTER — Other Ambulatory Visit (HOSPITAL_COMMUNITY): Payer: Self-pay

## 2022-06-27 DIAGNOSIS — M5416 Radiculopathy, lumbar region: Secondary | ICD-10-CM | POA: Diagnosis not present

## 2022-06-27 DIAGNOSIS — M5442 Lumbago with sciatica, left side: Secondary | ICD-10-CM | POA: Diagnosis not present

## 2022-06-27 DIAGNOSIS — M9904 Segmental and somatic dysfunction of sacral region: Secondary | ICD-10-CM | POA: Diagnosis not present

## 2022-06-27 DIAGNOSIS — M9905 Segmental and somatic dysfunction of pelvic region: Secondary | ICD-10-CM | POA: Diagnosis not present

## 2022-06-27 DIAGNOSIS — M25652 Stiffness of left hip, not elsewhere classified: Secondary | ICD-10-CM | POA: Diagnosis not present

## 2022-06-27 DIAGNOSIS — M7918 Myalgia, other site: Secondary | ICD-10-CM | POA: Diagnosis not present

## 2022-06-27 DIAGNOSIS — M9903 Segmental and somatic dysfunction of lumbar region: Secondary | ICD-10-CM | POA: Diagnosis not present

## 2022-06-27 MED ORDER — CHLORHEXIDINE GLUCONATE 0.12% ORAL RINSE (MEDLINE KIT)
OROMUCOSAL | 2 refills | Status: DC
  Filled 2022-06-27: qty 473, 16d supply, fill #0
  Filled 2022-07-10: qty 473, 16d supply, fill #1
  Filled 2022-08-04: qty 473, 16d supply, fill #2

## 2022-06-28 ENCOUNTER — Other Ambulatory Visit (HOSPITAL_COMMUNITY): Payer: Self-pay

## 2022-07-09 ENCOUNTER — Other Ambulatory Visit: Payer: Self-pay

## 2022-07-09 ENCOUNTER — Telehealth: Payer: Self-pay | Admitting: Pulmonary Disease

## 2022-07-09 ENCOUNTER — Other Ambulatory Visit (HOSPITAL_COMMUNITY): Payer: Self-pay

## 2022-07-09 ENCOUNTER — Other Ambulatory Visit: Payer: Self-pay | Admitting: Internal Medicine

## 2022-07-09 MED ORDER — FREESTYLE LITE TEST VI STRP
ORAL_STRIP | 0 refills | Status: AC
  Filled 2022-07-09: qty 200, 50d supply, fill #0

## 2022-07-09 MED ORDER — TADALAFIL 20 MG PO TABS
20.0000 mg | ORAL_TABLET | Freq: Every evening | ORAL | 11 refills | Status: AC
  Filled 2022-07-09: qty 90, 90d supply, fill #0
  Filled 2022-10-13: qty 90, 90d supply, fill #1
  Filled 2023-01-12: qty 90, 90d supply, fill #2
  Filled 2023-06-03: qty 90, 90d supply, fill #3

## 2022-07-09 MED ORDER — LOSARTAN POTASSIUM 100 MG PO TABS
100.0000 mg | ORAL_TABLET | Freq: Every day | ORAL | 0 refills | Status: DC
  Filled 2022-07-09: qty 30, 30d supply, fill #0

## 2022-07-09 MED ORDER — AMLODIPINE BESYLATE 5 MG PO TABS
5.0000 mg | ORAL_TABLET | Freq: Every day | ORAL | 0 refills | Status: DC
  Filled 2022-07-09 – 2022-08-04 (×2): qty 90, 90d supply, fill #0

## 2022-07-09 MED ORDER — AMOXICILLIN-POT CLAVULANATE 875-125 MG PO TABS
1.0000 | ORAL_TABLET | Freq: Two times a day (BID) | ORAL | 0 refills | Status: DC
  Filled 2022-07-09: qty 20, 10d supply, fill #0

## 2022-07-09 NOTE — Telephone Encounter (Signed)
Reports having sinus and ear congestion, pain in Lt ear.  Will send script for augmentin.

## 2022-07-10 ENCOUNTER — Other Ambulatory Visit: Payer: Self-pay

## 2022-07-10 ENCOUNTER — Other Ambulatory Visit (HOSPITAL_COMMUNITY): Payer: Self-pay

## 2022-07-12 ENCOUNTER — Other Ambulatory Visit (HOSPITAL_COMMUNITY): Payer: Self-pay

## 2022-07-17 ENCOUNTER — Ambulatory Visit (AMBULATORY_SURGERY_CENTER): Payer: 59 | Admitting: *Deleted

## 2022-07-17 ENCOUNTER — Other Ambulatory Visit (HOSPITAL_COMMUNITY): Payer: Self-pay

## 2022-07-17 VITALS — Ht 71.0 in | Wt 290.0 lb

## 2022-07-17 DIAGNOSIS — Z86010 Personal history of colonic polyps: Secondary | ICD-10-CM

## 2022-07-17 MED ORDER — NA SULFATE-K SULFATE-MG SULF 17.5-3.13-1.6 GM/177ML PO SOLN
1.0000 | Freq: Once | ORAL | 0 refills | Status: AC
  Filled 2022-07-17: qty 354, 1d supply, fill #0

## 2022-07-17 NOTE — Progress Notes (Signed)
No egg or soy allergy known to patient  No issues known to pt with past sedation with any surgeries or procedures Patient denies ever being told they had issues or difficulty with intubation  No FH of Malignant Hyperthermia Pt is not on diet pills Pt is not on home 02  Pt is not on blood thinners  Pt denies issues with constipation  No A fib or A flutter Have any cardiac testing pending--NO Pt instructed to use Singlecare.com or GoodRx for a price reduction on prep   

## 2022-07-22 ENCOUNTER — Other Ambulatory Visit: Payer: Self-pay

## 2022-07-22 ENCOUNTER — Other Ambulatory Visit (HOSPITAL_COMMUNITY): Payer: Self-pay

## 2022-07-24 ENCOUNTER — Other Ambulatory Visit (HOSPITAL_COMMUNITY): Payer: Self-pay

## 2022-07-24 MED ORDER — MINOCYCLINE HCL 100 MG PO CAPS
100.0000 mg | ORAL_CAPSULE | Freq: Two times a day (BID) | ORAL | 0 refills | Status: DC
  Filled 2022-07-24: qty 20, 10d supply, fill #0

## 2022-07-25 ENCOUNTER — Encounter: Payer: Self-pay | Admitting: Certified Registered Nurse Anesthetist

## 2022-07-26 ENCOUNTER — Encounter: Payer: Self-pay | Admitting: Internal Medicine

## 2022-07-29 ENCOUNTER — Ambulatory Visit: Payer: Commercial Managed Care - PPO | Admitting: Internal Medicine

## 2022-07-29 ENCOUNTER — Encounter: Payer: Self-pay | Admitting: Internal Medicine

## 2022-07-29 VITALS — BP 153/87 | HR 69 | Temp 98.6°F | Ht 71.0 in | Wt 290.0 lb

## 2022-07-29 MED ORDER — SODIUM CHLORIDE 0.9 % IV SOLN: 500.0000 mL | Freq: Once | INTRAVENOUS | Status: AC

## 2022-07-29 NOTE — Patient Instructions (Addendum)
As you know you are in Afib with rapid rate but only having palpitations. Emergency department or Afib clinic reasonable but have decided against. - If you get chest pain or other concerning symptoms like respiratory distress or incessant palpitations seek care at ED or PCP.  You can let me know if you will repeat a colonoscopy.  I appreciate the opportunity to care for you. Gatha Mayer, MD, [facility]

## 2022-07-29 NOTE — Progress Notes (Unsigned)
EKG showing rapid a fib with rates 120 to 140s, vss.  Case canceled.

## 2022-07-29 NOTE — Progress Notes (Unsigned)
Pt's states no medical or surgical changes since previsit or office visit. 

## 2022-07-29 NOTE — Progress Notes (Unsigned)
Patient was brought to recovery by CRNA Einar Gip. Procedure was not done due to patient being in atrial fib. [name] spoke with patient, vital signs were obtained and patient was allowed to leave. He left with his wife.

## 2022-07-29 NOTE — Progress Notes (Unsigned)
[facility] History and Physical   Primary Care Physician:  Plotnikov, Evie Lacks, MD   Reason for Procedure:   Hx colon polyps  Plan:    Colonoscopy - cancelled - pt declines Ed visit or afib clinic referral He will f/u PCP.     HPI: Rodney Hayes is a 72 y.o. male s/p removal of 4 mm adenoma 2011. Presented today and was in Afib w/ RVR. + palpitations but no chest pain or dyspnea. Did have Afib w/ COVID admission last year 1/23.   Past Medical History:  Diagnosis Date   Allergy    SEASONAL   Arthritis    Asthma    as a child   BPH (benign prostatic hyperplasia)    Cancer ([place])    prostatectomy 2018 Gleason 7   Cataract    BILATERAL   Diabetes mellitus    average fasting 140s type 2   DJD (degenerative joint disease)    Heart murmur    [name]   Hepatitis    hep B   Hyperlipidemia    Hypertension    Obesity    Sleep apnea    C-PAP    Past Surgical History:  Procedure Laterality Date   Biceps tendon reinsertion     colonoscopy with polypectomy     adenomatous; [name]   JOINT REPLACEMENT Left    knee   left knee arthroscopy     LYMPHADENECTOMY Bilateral 01/23/2017   Procedure: BILATERAL LYMPHADENECTOMY;  Surgeon: [name], MD;  Location: [facility];  Service: Urology;  Laterality: Bilateral;   PROSTATECTOMY     01/23/17 [name]   ROBOT ASSISTED LAPAROSCOPIC RADICAL PROSTATECTOMY N/A 01/23/2017   Procedure: XI ROBOTIC ASSISTED LAPAROSCOPIC RADICAL PROSTATECTOMY LEVEL 3;  Surgeon: [name], MD;  Location: [facility];  Service: Urology;  Laterality: N/A;   TONSILLECTOMY AND ADENOIDECTOMY     TOTAL KNEE ARTHROPLASTY Left 01/11/2013   Procedure: LEFT TOTAL KNEE ARTHROPLASTY;  Surgeon: [name], MD;  Location: [facility];  Service: Orthopedics;  Laterality: Left;   TOTAL SHOULDER ARTHROPLASTY Left 08/12/2014   Procedure: LEFT TOTAL SHOULDER ARTHROPLASTY;  Surgeon: [name], MD;  Location: [facility];  Service: Orthopedics;   Laterality: Left;   UMBILICAL HERNIA REPAIR      Prior to Admission medications   Medication Sig Start Date End Date Taking? Authorizing Provider  amLODipine (NORVASC) 5 MG tablet Take 1 tablet (5 mg total) by mouth daily. Must keep February for future refills 07/09/22  Yes Plotnikov, Evie Lacks, MD  Cholecalciferol (VITAMIN D3) 50 MCG (2000 UT) capsule Take 1 capsule (2,000 Units total) by mouth daily. 08/25/18  Yes Plotnikov, Evie Lacks, MD  glucose blood (FREESTYLE LITE) test strip Use as directed four times daily 07/09/22  Yes Plotnikov, Evie Lacks, MD  glucose monitoring kit (FREESTYLE) monitoring kit 1 each by Does not apply route as needed for other. 04/12/20  Yes Plotnikov, Evie Lacks, MD  hydrochlorothiazide (HYDRODIURIL) 25 MG tablet Take 1 tablet (25 mg total) by mouth daily. Must keep February for future refills 05/09/22  Yes Plotnikov, Evie Lacks, MD  icosapent Ethyl (VASCEPA) 1 g capsule Take 2 capsules (2 g total) by mouth 2 (two) times daily. Must keep February for future refills Patient taking differently: Take 2 g by mouth daily. Must keep February for future refills 05/09/22  Yes Plotnikov, Evie Lacks, MD  insulin glargine-yfgn (SEMGLEE) 100 UNIT/ML Pen Inject 20 Units into the skin 2 (two) times daily. 08/13/21  Yes Plotnikov, Evie Lacks, MD  Insulin Pen Needle (UNIFINE PENTIPS) 31G X 5 MM MISC use as directed at bedtime 08/23/21  Yes Plotnikov, Evie Lacks, MD  Lancets (FREESTYLE) lancets Use as instructed qid 04/12/20  Yes Plotnikov, Evie Lacks, MD  losartan (COZAAR) 100 MG tablet Take 1 tablet (100 mg total) by mouth daily. Annual appt due in Sept must see provider for future refills 07/09/22  Yes Plotnikov, Evie Lacks, MD  minocycline (MINOCIN) 100 MG capsule Take 1 capsule (100 mg total) by mouth 2 (two) times daily. 07/24/22  Yes   traMADol (ULTRAM) 50 MG tablet Take 1 tablet by mouth three to four times daily as needed 02/20/22  Yes Plotnikov, Evie Lacks, MD  albuterol (VENTOLIN HFA) 108 (90  Base) MCG/ACT inhaler Inhale 2 puffs into the lungs every 6 (six) hours as needed for wheezing or shortness of breath. 08/07/21   Candee Furbish, MD  chlorhexidine gluconate, MEDLINE KIT, (PERIDEX) 0.12 % solution swish and spit, as directed on bottle. 06/26/22   Jillyn Hidden, DMD  COVID-19 At Home Antigen Test [facility] COVID-19 HOME TEST) KIT Use as directed Patient not taking: Reported on 07/17/2022 08/16/21   Jefm Bryant, [facility]  COVID-19 mRNA vaccine [phone] (COMIRNATY) SUSP injection Inject into the muscle. Patient not taking: Reported on 07/17/2022 05/01/22   Carlyle Basques, MD  fluticasone-salmeterol (ADVAIR DISKUS) 500-50 MCG/ACT AEPB Inhale 1 puff into the lungs 2 (two) times daily. Patient not taking: Reported on 07/17/2022 05/16/22   Candee Furbish, MD  Lancets Misc. (ACCU-CHEK FASTCLIX LANCET) KIT Use as directed 11/12/16   Noralee Space, MD  levalbuterol Penne Lash) 0.63 MG/3ML nebulizer solution Take 3 mLs (0.63 mg total) by nebulization every 4 (four) hours as needed for wheezing or shortness of breath. Patient not taking: Reported on 07/17/2022 08/16/21   Plotnikov, Evie Lacks, MD  psyllium (METAMUCIL) 58.6 % packet Take 1 packet by mouth daily.    [provider]  Sodium Fluoride 1.1 % PSTE Use as directed on package 06/27/21     tadalafil (CIALIS) 20 MG tablet Take 1 tablet (20 mg total) by mouth every evening if needed 07/09/22       Current Outpatient Medications  Medication Sig Dispense Refill   amLODipine (NORVASC) 5 MG tablet Take 1 tablet (5 mg total) by mouth daily. Must keep February for future refills 90 tablet 0   Cholecalciferol (VITAMIN D3) 50 MCG (2000 UT) capsule Take 1 capsule (2,000 Units total) by mouth daily. 100 capsule 3   glucose blood (FREESTYLE LITE) test strip Use as directed four times daily 200 each 0   glucose monitoring kit (FREESTYLE) monitoring kit 1 each by Does not apply route as needed for other. 1 each 1   hydrochlorothiazide (HYDRODIURIL)  25 MG tablet Take 1 tablet (25 mg total) by mouth daily. Must keep February for future refills 90 tablet 0   icosapent Ethyl (VASCEPA) 1 g capsule Take 2 capsules (2 g total) by mouth 2 (two) times daily. Must keep February for future refills (Patient taking differently: Take 2 g by mouth daily. Must keep February for future refills) 360 capsule 0   insulin glargine-yfgn (SEMGLEE) 100 UNIT/ML Pen Inject 20 Units into the skin 2 (two) times daily. 12 mL 11   Insulin Pen Needle (UNIFINE PENTIPS) 31G X 5 MM MISC use as directed at bedtime 100 each 3   Lancets (FREESTYLE) lancets Use as instructed qid 200 each 12   losartan (COZAAR) 100 MG  tablet Take 1 tablet (100 mg total) by mouth daily. Annual appt due in Sept must see provider for future refills 30 tablet 0   minocycline (MINOCIN) 100 MG capsule Take 1 capsule (100 mg total) by mouth 2 (two) times daily. 20 capsule 0   traMADol (ULTRAM) 50 MG tablet Take 1 tablet by mouth three to four times daily as needed 120 tablet 3   albuterol (VENTOLIN HFA) 108 (90 Base) MCG/ACT inhaler Inhale 2 puffs into the lungs every 6 (six) hours as needed for wheezing or shortness of breath. 8.5 g 2   chlorhexidine gluconate, MEDLINE KIT, (PERIDEX) 0.12 % solution swish and spit, as directed on bottle. 473 mL 2   COVID-19 At Home Antigen Test [facility] COVID-19 HOME TEST) KIT Use as directed (Patient not taking: Reported on 07/17/2022) 4 each 0   COVID-19 mRNA vaccine 2023-2024 (COMIRNATY) SUSP injection Inject into the muscle. (Patient not taking: Reported on 07/17/2022) 0.3 mL 0   fluticasone-salmeterol (ADVAIR DISKUS) 500-50 MCG/ACT AEPB Inhale 1 puff into the lungs 2 (two) times daily. (Patient not taking: Reported on 07/17/2022) 60 each 2   Lancets Misc. (ACCU-CHEK FASTCLIX LANCET) KIT Use as directed 1 kit 5   levalbuterol (XOPENEX) 0.63 MG/3ML nebulizer solution Take 3 mLs (0.63 mg total) by nebulization every 4 (four) hours as needed for wheezing or shortness of  breath. (Patient not taking: Reported on 07/17/2022) 120 mL 5   psyllium (METAMUCIL) 58.6 % packet Take 1 packet by mouth daily.     Sodium Fluoride 1.1 % PSTE Use as directed on package 200 mL 11   tadalafil (CIALIS) 20 MG tablet Take 1 tablet (20 mg total) by mouth every evening if needed 30 tablet 11   Current Facility-Administered Medications  Medication Dose Route Frequency Provider Last Rate Last Admin   0.9 %  sodium chloride infusion  500 mL Intravenous Once Gatha Mayer, MD        Allergies as of 07/29/2022   (No Active Allergies)    Family History  Problem Relation Age of Onset   Colon cancer Mother    Lung cancer Mother        smoker/lung ca and colon?   Alcohol abuse Mother    Diabetes Mother    Diabetes Father    Coronary artery disease Father        CBAG in late 28s   Cancer Father        prostate   Heart attack Father    Stroke Neg Hx    Colon polyps Neg Hx    Crohn's disease Neg Hx    Esophageal cancer Neg Hx    Rectal cancer Neg Hx    Stomach cancer Neg Hx    Ulcerative colitis Neg Hx     Social History   Socioeconomic History   Marital status: Married    Spouse name: Not on file   Number of children: Not on file   Years of education: Not on file   Highest education level: Not on file  Occupational History   Not on file  Tobacco Use   Smoking status: Former    Packs/day: 2.00    Years: 30.00    Total pack years: 60.00    Types: Cigarettes    Start date: 07/15/1962    Quit date: 08/15/1992    Years since quitting: 29.9   Smokeless tobacco: Never   Tobacco comments:    smoked 14 -42 up to 2 ppd  Vaping  Use   Vaping Use: Never used  Substance and Sexual Activity   Alcohol use: Yes    Alcohol/week: 4.0 standard drinks of alcohol    Types: 4 Glasses of wine per week    Comment: Red Wine   Drug use: No   Sexual activity: Yes  Other Topics Concern   Not on file  Social History Narrative   Not on file   Social Determinants of Health    Financial Resource Strain: Not on file  Food Insecurity: Not on file  Transportation Needs: Not on file  Physical Activity: Not on file  Stress: Not on file  Social Connections: Not on file  Intimate Partner Violence: Not on file    Review of Systems: All other review of systems negative except as mentioned in the HPI.  Physical Exam: Vital signs BP (!) 153/87   Pulse 69   Temp 98.6 F (37 C)   Ht '5\' 11"'$  (1.803 m)   Wt 290 lb (131.5 kg)   SpO2 96%   BMI 40.45 kg/m   General:   Alert,  Well-developed, well-nourished, pleasant and cooperative in NAD Lungs:  Clear throughout to auscultation.   Heart:  irregular rhythm, rapid rate no murmurs, clicks, rubs,  or gallops. Abdomen:  Soft, nontender and nondistended. Normal bowel sounds.   Neuro/Psych:  Alert and cooperative. Normal mood and affect. 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    '@Kenneshia Rehm'$  Simonne Maffucci, MD, [facility] [phone] (pager) 07/29/2022 8:40 AM@

## 2022-07-30 DIAGNOSIS — M62838 Other muscle spasm: Secondary | ICD-10-CM | POA: Diagnosis not present

## 2022-07-30 DIAGNOSIS — M6281 Muscle weakness (generalized): Secondary | ICD-10-CM | POA: Diagnosis not present

## 2022-07-30 DIAGNOSIS — N393 Stress incontinence (female) (male): Secondary | ICD-10-CM | POA: Diagnosis not present

## 2022-07-30 NOTE — Progress Notes (Signed)
Called patient yesterday evening - no symptoms,BP normal he said and pulse in 80's and regular

## 2022-08-04 ENCOUNTER — Other Ambulatory Visit: Payer: Self-pay | Admitting: Internal Medicine

## 2022-08-05 ENCOUNTER — Other Ambulatory Visit: Payer: Self-pay

## 2022-08-05 ENCOUNTER — Other Ambulatory Visit (HOSPITAL_COMMUNITY): Payer: Self-pay

## 2022-08-05 MED ORDER — LOSARTAN POTASSIUM 100 MG PO TABS
100.0000 mg | ORAL_TABLET | Freq: Every day | ORAL | 11 refills | Status: DC
  Filled 2022-08-05: qty 30, 30d supply, fill #0
  Filled 2022-09-13: qty 90, 90d supply, fill #1
  Filled 2022-10-13 – 2022-10-23 (×3): qty 90, 90d supply, fill #2
  Filled 2023-03-20: qty 90, 90d supply, fill #3
  Filled 2023-06-16: qty 30, 30d supply, fill #4
  Filled 2023-07-12: qty 30, 30d supply, fill #5

## 2022-08-12 ENCOUNTER — Encounter: Payer: Self-pay | Admitting: Internal Medicine

## 2022-08-13 DIAGNOSIS — M7918 Myalgia, other site: Secondary | ICD-10-CM | POA: Diagnosis not present

## 2022-08-13 DIAGNOSIS — M9904 Segmental and somatic dysfunction of sacral region: Secondary | ICD-10-CM | POA: Diagnosis not present

## 2022-08-13 DIAGNOSIS — M9905 Segmental and somatic dysfunction of pelvic region: Secondary | ICD-10-CM | POA: Diagnosis not present

## 2022-08-13 DIAGNOSIS — M9903 Segmental and somatic dysfunction of lumbar region: Secondary | ICD-10-CM | POA: Diagnosis not present

## 2022-08-13 DIAGNOSIS — M5442 Lumbago with sciatica, left side: Secondary | ICD-10-CM | POA: Diagnosis not present

## 2022-08-13 DIAGNOSIS — M25652 Stiffness of left hip, not elsewhere classified: Secondary | ICD-10-CM | POA: Diagnosis not present

## 2022-08-13 DIAGNOSIS — M5416 Radiculopathy, lumbar region: Secondary | ICD-10-CM | POA: Diagnosis not present

## 2022-08-13 NOTE — Telephone Encounter (Signed)
Pt has [employer].Marland KitchenJohny Chess

## 2022-08-14 ENCOUNTER — Other Ambulatory Visit: Payer: Self-pay | Admitting: Internal Medicine

## 2022-08-14 DIAGNOSIS — E1165 Type 2 diabetes mellitus with hyperglycemia: Secondary | ICD-10-CM

## 2022-08-14 DIAGNOSIS — Z0000 Encounter for general adult medical examination without abnormal findings: Secondary | ICD-10-CM

## 2022-08-14 NOTE — Telephone Encounter (Signed)
MD had included PSA labs done today.Marland KitchenJohny Chess

## 2022-08-20 ENCOUNTER — Telehealth: Payer: Self-pay | Admitting: Student

## 2022-08-20 NOTE — Telephone Encounter (Signed)
Error

## 2022-08-22 ENCOUNTER — Other Ambulatory Visit (INDEPENDENT_AMBULATORY_CARE_PROVIDER_SITE_OTHER): Payer: Commercial Managed Care - PPO

## 2022-08-22 ENCOUNTER — Other Ambulatory Visit: Payer: Self-pay | Admitting: Internal Medicine

## 2022-08-22 DIAGNOSIS — E1165 Type 2 diabetes mellitus with hyperglycemia: Secondary | ICD-10-CM | POA: Diagnosis not present

## 2022-08-22 DIAGNOSIS — Z0000 Encounter for general adult medical examination without abnormal findings: Secondary | ICD-10-CM

## 2022-08-22 DIAGNOSIS — Z125 Encounter for screening for malignant neoplasm of prostate: Secondary | ICD-10-CM | POA: Diagnosis not present

## 2022-08-22 LAB — TSH: TSH: 1.52 u[IU]/mL (ref 0.35–5.50)

## 2022-08-22 LAB — COMPREHENSIVE METABOLIC PANEL
ALT: 21 U/L (ref 0–53)
AST: 20 U/L (ref 0–37)
Albumin: 4.2 g/dL (ref 3.5–5.2)
Alkaline Phosphatase: 82 U/L (ref 39–117)
BUN: 21 mg/dL (ref 6–23)
CO2: 33 mEq/L — ABNORMAL HIGH (ref 19–32)
Calcium: 10.6 mg/dL — ABNORMAL HIGH (ref 8.4–10.5)
Chloride: 98 mEq/L (ref 96–112)
Creatinine, Ser: 0.83 mg/dL (ref 0.40–1.50)
GFR: 86.56 mL/min (ref >60.00)
Glucose, Bld: 109 mg/dL — ABNORMAL HIGH (ref 70–99)
Potassium: 4 mEq/L (ref 3.5–5.1)
Sodium: 141 mEq/L (ref 135–145)
Total Bilirubin: 0.7 mg/dL (ref 0.2–1.2)
Total Protein: 6.8 g/dL (ref 6.0–8.3)

## 2022-08-22 LAB — CBC WITH DIFFERENTIAL/PLATELET
Basophils Absolute: 0 10*3/uL (ref 0.0–0.1)
Basophils Relative: 0.8 % (ref 0.0–3.0)
Eosinophils Absolute: 0.2 10*3/uL (ref 0.0–0.7)
Eosinophils Relative: 2.8 % (ref 0.0–5.0)
HCT: 38.2 % — ABNORMAL LOW (ref 39.0–52.0)
Hemoglobin: 13.1 g/dL (ref 13.0–17.0)
Lymphocytes Relative: 26.7 % (ref 12.0–46.0)
Lymphs Abs: 1.6 10*3/uL (ref 0.7–4.0)
MCHC: 34.4 g/dL (ref 30.0–36.0)
MCV: 92 fl (ref 78.0–100.0)
Monocytes Absolute: 0.7 10*3/uL (ref 0.1–1.0)
Monocytes Relative: 11.2 % (ref 3.0–12.0)
Neutro Abs: 3.5 10*3/uL (ref 1.4–7.7)
Neutrophils Relative %: 58.5 % (ref 43.0–77.0)
Platelets: 218 10*3/uL (ref 150.0–400.0)
RBC: 4.15 Mil/uL — ABNORMAL LOW (ref 4.22–5.81)
RDW: 13.6 % (ref 11.5–15.5)
WBC: 6 10*3/uL (ref 4.0–10.5)

## 2022-08-22 LAB — URINALYSIS, ROUTINE W REFLEX MICROSCOPIC
Bilirubin Urine: NEGATIVE
Hgb urine dipstick: NEGATIVE
Ketones, ur: NEGATIVE
Leukocytes,Ua: NEGATIVE
Nitrite: NEGATIVE
RBC / HPF: NONE SEEN (ref 0–?)
Specific Gravity, Urine: 1.015 (ref 1.000–1.030)
Total Protein, Urine: 100 — AB
Urine Glucose: NEGATIVE
Urobilinogen, UA: 0.2 (ref 0.0–1.0)
pH: 6.5 (ref 5.0–8.0)

## 2022-08-22 LAB — MICROALBUMIN / CREATININE URINE RATIO
Creatinine,U: 21.5 mg/dL
Microalb Creat Ratio: 257.6 mg/g — ABNORMAL HIGH (ref 0.0–30.0)
Microalb, Ur: 55.4 mg/dL — ABNORMAL HIGH (ref 0.0–1.9)

## 2022-08-22 LAB — LIPID PANEL
Cholesterol: 207 mg/dL — ABNORMAL HIGH (ref 0–200)
HDL: 49.2 mg/dL (ref >39.00)
LDL Cholesterol: 126 mg/dL — ABNORMAL HIGH (ref 0–99)
NonHDL: 157.31
Total CHOL/HDL Ratio: 4
Triglycerides: 159 mg/dL — ABNORMAL HIGH (ref 0.0–149.0)
VLDL: 31.8 mg/dL (ref 0.0–40.0)

## 2022-08-22 LAB — HEMOGLOBIN A1C: Hgb A1c MFr Bld: 9.3 % — ABNORMAL HIGH (ref 4.6–6.5)

## 2022-08-22 LAB — PSA: PSA: 0 ng/mL — ABNORMAL LOW (ref 0.10–4.00)

## 2022-08-23 ENCOUNTER — Other Ambulatory Visit (HOSPITAL_COMMUNITY): Payer: Self-pay

## 2022-08-23 MED ORDER — HYDROCHLOROTHIAZIDE 25 MG PO TABS
25.0000 mg | ORAL_TABLET | Freq: Every day | ORAL | 0 refills | Status: DC
  Filled 2022-08-23: qty 90, 90d supply, fill #0

## 2022-08-27 ENCOUNTER — Other Ambulatory Visit (HOSPITAL_COMMUNITY): Payer: Self-pay

## 2022-08-27 ENCOUNTER — Other Ambulatory Visit: Payer: Self-pay | Admitting: Internal Medicine

## 2022-08-27 ENCOUNTER — Encounter: Payer: Self-pay | Admitting: Internal Medicine

## 2022-08-27 ENCOUNTER — Ambulatory Visit: Payer: Commercial Managed Care - PPO | Admitting: Internal Medicine

## 2022-08-27 VITALS — BP 150/98 | HR 78 | Temp 98.7°F | Ht 71.0 in | Wt 300.6 lb

## 2022-08-27 DIAGNOSIS — R9721 Rising PSA following treatment for malignant neoplasm of prostate: Secondary | ICD-10-CM

## 2022-08-27 DIAGNOSIS — I10 Essential (primary) hypertension: Secondary | ICD-10-CM | POA: Diagnosis not present

## 2022-08-27 DIAGNOSIS — E1165 Type 2 diabetes mellitus with hyperglycemia: Secondary | ICD-10-CM

## 2022-08-27 DIAGNOSIS — C61 Malignant neoplasm of prostate: Secondary | ICD-10-CM | POA: Diagnosis not present

## 2022-08-27 DIAGNOSIS — F418 Other specified anxiety disorders: Secondary | ICD-10-CM

## 2022-08-27 DIAGNOSIS — E66813 Obesity, class 3: Secondary | ICD-10-CM

## 2022-08-27 DIAGNOSIS — Z6841 Body Mass Index (BMI) 40.0 and over, adult: Secondary | ICD-10-CM

## 2022-08-27 MED ORDER — INSULIN GLARGINE-YFGN 100 UNIT/ML ~~LOC~~ SOPN
20.0000 [IU] | PEN_INJECTOR | Freq: Two times a day (BID) | SUBCUTANEOUS | 0 refills | Status: DC
  Filled 2022-08-27: qty 15, 38d supply, fill #0

## 2022-08-27 MED ORDER — DIAZEPAM 5 MG PO TABS
5.0000 mg | ORAL_TABLET | ORAL | 1 refills | Status: DC
  Filled 2022-08-27: qty 30, 30d supply, fill #0
  Filled 2022-10-22: qty 30, 30d supply, fill #1

## 2022-08-27 MED ORDER — DAPAGLIFLOZIN PROPANEDIOL 10 MG PO TABS
10.0000 mg | ORAL_TABLET | Freq: Every day | ORAL | 11 refills | Status: DC
  Filled 2022-08-27: qty 30, 30d supply, fill #0
  Filled 2022-09-13 – 2022-09-23 (×3): qty 30, 30d supply, fill #1
  Filled 2022-10-22: qty 30, 30d supply, fill #2
  Filled 2022-11-20: qty 30, 30d supply, fill #3
  Filled 2022-12-24: qty 30, 30d supply, fill #4
  Filled 2023-01-12 – 2023-01-21 (×2): qty 30, 30d supply, fill #5
  Filled 2023-02-16 – 2023-02-17 (×2): qty 30, 30d supply, fill #6
  Filled 2023-03-17: qty 30, 30d supply, fill #7
  Filled 2023-04-01 – 2023-04-13 (×2): qty 30, 30d supply, fill #8
  Filled 2023-05-03 – 2023-05-09 (×2): qty 30, 30d supply, fill #9
  Filled 2023-06-12: qty 30, 30d supply, fill #10
  Filled 2023-07-12: qty 30, 30d supply, fill #11

## 2022-08-27 MED ORDER — DEXCOM G7 SENSOR MISC
11 refills | Status: DC
  Filled 2022-08-29: qty 3, 30d supply, fill #0
  Filled 2022-09-23: qty 3, 30d supply, fill #1
  Filled 2022-10-22: qty 3, 30d supply, fill #2
  Filled 2022-12-09: qty 3, 30d supply, fill #3
  Filled 2023-01-12: qty 3, 30d supply, fill #4
  Filled 2023-03-01: qty 3, 30d supply, fill #5
  Filled 2023-04-01: qty 3, 30d supply, fill #6
  Filled 2023-05-03: qty 3, 30d supply, fill #7
  Filled 2023-06-03: qty 3, 30d supply, fill #8
  Filled 2023-07-06: qty 3, 30d supply, fill #9
  Filled 2023-07-31: qty 3, 30d supply, fill #10

## 2022-08-27 MED ORDER — TRAMADOL HCL 50 MG PO TABS
50.0000 mg | ORAL_TABLET | Freq: Four times a day (QID) | ORAL | 3 refills | Status: DC
  Filled 2022-08-27: qty 120, 30d supply, fill #0
  Filled 2022-10-22: qty 120, 30d supply, fill #1
  Filled 2022-12-11: qty 120, 30d supply, fill #2
  Filled 2023-01-12: qty 120, 30d supply, fill #3

## 2022-08-27 NOTE — Assessment & Plan Note (Signed)
BP - good at home 

## 2022-08-27 NOTE — Progress Notes (Signed)
Subjective:  Patient ID: Rodney Hayes, male    DOB: August 20, 1950  Age: 72 y.o. MRN: [id number]  CC: No chief complaint on file.   HPI Rodney Hayes presents for a panic attacks (last when he had colonoscopy) and other medical procedures  Outpatient Medications Prior to Visit  Medication Sig Dispense Refill   albuterol (VENTOLIN HFA) 108 (90 Base) MCG/ACT inhaler Inhale 2 puffs into the lungs every 6 (six) hours as needed for wheezing or shortness of breath. 8.5 g 2   amLODipine (NORVASC) 5 MG tablet Take 1 tablet (5 mg total) by mouth daily. Must keep February appt for future refills 90 tablet 0   chlorhexidine gluconate, MEDLINE KIT, (PERIDEX) 0.12 % solution swish and spit, as directed on bottle. 473 mL 2   Cholecalciferol (VITAMIN D3) 50 MCG (2000 UT) capsule Take 1 capsule (2,000 Units total) by mouth daily. 100 capsule 3   COVID-19 At Home Antigen Test [facility] COVID-19 HOME TEST) KIT Use as directed 4 each 0   COVID-19 mRNA vaccine 2023-2024 (COMIRNATY) SUSP injection Inject into the muscle. 0.3 mL 0   glucose blood (FREESTYLE LITE) test strip Use as directed four times daily 200 each 0   glucose monitoring kit (FREESTYLE) monitoring kit 1 each by Does not apply route as needed for other. 1 each 1   hydrochlorothiazide (HYDRODIURIL) 25 MG tablet Take 1 tablet (25 mg total) by mouth daily. Must keep February appointment for future refills 90 tablet 0   icosapent Ethyl (VASCEPA) 1 g capsule Take 2 capsules (2 g total) by mouth 2 (two) times daily. Must keep February for future refills (Patient taking differently: Take 2 g by mouth daily. Must keep February for future refills) 360 capsule 0   insulin glargine-yfgn (SEMGLEE, YFGN,) 100 UNIT/ML Pen Inject 20 Units into the skin 2 (two) times daily. Annual appt is due must see provider for future refills 15 mL 0   Insulin Pen Needle (UNIFINE PENTIPS) 31G X 5 MM MISC use as directed at bedtime 100 each 3   Lancets (FREESTYLE) lancets Use  as instructed qid 200 each 12   Lancets Misc. (ACCU-CHEK FASTCLIX LANCET) KIT Use as directed 1 kit 5   losartan (COZAAR) 100 MG tablet Take 1 tablet (100 mg total) by mouth daily 30 tablet 11   minocycline (MINOCIN) 100 MG capsule Take 1 capsule (100 mg total) by mouth 2 (two) times daily. 20 capsule 0   psyllium (METAMUCIL) 58.6 % packet Take 1 packet by mouth daily.     Sodium Fluoride 1.1 % PSTE Use as directed on package 200 mL 11   tadalafil (CIALIS) 20 MG tablet Take 1 tablet (20 mg total) by mouth every evening if needed 30 tablet 11   traMADol (ULTRAM) 50 MG tablet Take 1 tablet by mouth three to four times daily as needed 120 tablet 3   fluticasone-salmeterol (ADVAIR DISKUS) 500-50 MCG/ACT AEPB Inhale 1 puff into the lungs 2 (two) times daily. (Patient not taking: Reported on 07/17/2022) 60 each 2   levalbuterol (XOPENEX) 0.63 MG/3ML nebulizer solution Take 3 mLs (0.63 mg total) by nebulization every 4 (four) hours as needed for wheezing or shortness of breath. (Patient not taking: Reported on 07/17/2022) 120 mL 5   Facility-Administered Medications Prior to Visit  Medication Dose Route Frequency Provider Last Rate Last Admin   0.9 %  sodium chloride infusion  500 mL Intravenous Once Gatha Mayer, MD        ROS:  Review of Systems  Constitutional:  Positive for unexpected weight change. Negative for appetite change and fatigue.  HENT:  Negative for congestion, nosebleeds, sneezing, sore throat and trouble swallowing.   Eyes:  Negative for itching and visual disturbance.  Respiratory:  Negative for cough.   Cardiovascular:  Negative for chest pain, palpitations and leg swelling.  Gastrointestinal:  Negative for abdominal distention, blood in stool, diarrhea and nausea.  Genitourinary:  Negative for frequency and hematuria.  Musculoskeletal:  Negative for back pain, gait problem, joint swelling and neck pain.  Skin:  Negative for rash.  Neurological:  Negative for dizziness, tremors,  speech difficulty and weakness.  Psychiatric/Behavioral:  Negative for agitation, dysphoric mood, sleep disturbance and suicidal ideas. The patient is not nervous/anxious.     Objective:  BP (!) 150/98 (BP Location: Left Arm, Patient Position: Sitting, Cuff Size: Normal)   Pulse 78   Temp 98.7 F (37.1 C) (Oral)   Ht '5\' 11"'$  (1.803 m)   Wt (!) 300 lb 9.6 oz (136.4 kg)   SpO2 98%   BMI 41.93 kg/m   BP Readings from Last 3 Encounters:  08/27/22 (!) 150/98  07/29/22 (!) 153/87  08/13/21 132/70    Wt Readings from Last 3 Encounters:  08/27/22 (!) 300 lb 9.6 oz (136.4 kg)  07/29/22 290 lb (131.5 kg)  07/17/22 290 lb (131.5 kg)    Physical Exam  Lab Results  Component Value Date   WBC 6.0 08/22/2022   HGB 13.1 08/22/2022   HCT 38.2 (L) 08/22/2022   PLT 218.0 08/22/2022   GLUCOSE 109 (H) 08/22/2022   CHOL 207 (H) 08/22/2022   TRIG 159.0 (H) 08/22/2022   HDL 49.20 08/22/2022   LDLDIRECT 89.0 08/16/2016   LDLCALC 126 (H) 08/22/2022   ALT 21 08/22/2022   AST 20 08/22/2022   NA 141 08/22/2022   K 4.0 08/22/2022   CL 98 08/22/2022   CREATININE 0.83 08/22/2022   BUN 21 08/22/2022   CO2 33 (H) 08/22/2022   TSH 1.52 08/22/2022   PSA 0.00 (L) 08/22/2022   INR 0.91 01/06/2013   HGBA1C 9.3 (H) 08/22/2022   MICROALBUR 55.4 (H) 08/22/2022    DG Chest Port 1 View  Result Date: 07/31/2021 CLINICAL DATA:  COVID positive, ARDS EXAM: PORTABLE CHEST 1 VIEW COMPARISON:  01/14/2017 FINDINGS: Two frontal views of the chest demonstrate mild enlargement the cardiac silhouette, likely due to AP projection. Basilar predominant interstitial and ground-glass opacities are compatible with given history of COVID-19. Pulmonary edema could give a similar pattern. No effusion or pneumothorax. No acute bony abnormalities. IMPRESSION: 1. Basilar predominant interstitial and ground-glass opacities, favor COVID-19 pneumonia over edema. Electronically Signed   By: Randa Ngo M.D.   On: 07/31/2021  17:45    Assessment & Plan:   Problem List Items Addressed This Visit       Cardiovascular and Mediastinum   Essential hypertension - Primary    BP good at home      Relevant Orders   Comprehensive metabolic panel   Hemoglobin A1c     Endocrine   Uncontrolled type 2 diabetes mellitus with hyperglycemia, without long-term current use of insulin ([place])    [name] refused GLP1 class due to potential cancerogenesis He agreed to try [facility] on Dexcom 7 May need to switch to short insulin and re-start intermittent fasting      Relevant Medications   dapagliflozin propanediol (FARXIGA) 10 MG TABS tablet   Other Relevant Orders   Comprehensive metabolic  panel   Hemoglobin A1c     Genitourinary   Biochemically recurrent malignant neoplasm of prostate [facility])    Lab Results  Component Value Date   PSA 0.00 (L) 08/22/2022   PSA 0.00 (L) 06/25/2021   PSA 0.00 Repeated and verified X2. (L) 03/26/2021   F/u w/[name] Status post XRT      Relevant Medications   diazepam (VALIUM) 5 MG tablet     Other   Situational anxiety    Will use diazepam as needed prior to prolonged test/procedures      Relevant Medications   diazepam (VALIUM) 5 MG tablet   Obesity    Wt Readings from Last 3 Encounters:  08/27/22 (!) 300 lb 9.6 oz (136.4 kg)  07/29/22 290 lb (131.5 kg)  07/17/22 290 lb (131.5 kg)  [name] is working on it       Relevant Medications   dapagliflozin propanediol (FARXIGA) 10 MG TABS tablet      Meds ordered this encounter  Medications   Continuous Blood Gluc Sensor (DEXCOM G7 SENSOR) MISC    Sig: Replace every 10 days    Dispense:  3 each    Refill:  11   diazepam (VALIUM) 5 MG tablet    Sig: Take 5-10 mg 30 min prior to procedures    Dispense:  30 tablet    Refill:  1   dapagliflozin propanediol (FARXIGA) 10 MG TABS tablet    Sig: Take 1 tablet (10 mg total) by mouth daily before breakfast.    Dispense:  30 tablet    Refill:  11   traMADol  (ULTRAM) 50 MG tablet    Sig: Take 1 tablet (50 mg total) by mouth 3 (three) to 4 (four) times daily.    Dispense:  120 tablet    Refill:  3      Follow-up: Return in about 3 months (around 11/25/2022) for a follow-up visit.  Walker Kehr, MD

## 2022-08-27 NOTE — Assessment & Plan Note (Addendum)
Lab Results  Component Value Date   PSA 0.00 (L) 08/22/2022   PSA 0.00 (L) 06/25/2021   PSA 0.00 Repeated and verified X2. (L) 03/26/2021    F/u w/[name] Status post XRT

## 2022-08-27 NOTE — Assessment & Plan Note (Signed)
Wt Readings from Last 3 Encounters:  08/27/22 (!) 300 lb 9.6 oz (136.4 kg)  07/29/22 290 lb (131.5 kg)  07/17/22 290 lb (131.5 kg)  Rodney Hayes is working on it

## 2022-08-27 NOTE — Assessment & Plan Note (Addendum)
Rodney Hayes refused GLP1 class due to potential cancerogenesis He agreed to try [facility] on Dexcom 7 May need to switch to short insulin and re-start intermittent fasting

## 2022-08-28 ENCOUNTER — Other Ambulatory Visit (HOSPITAL_COMMUNITY): Payer: Self-pay

## 2022-08-29 ENCOUNTER — Other Ambulatory Visit (HOSPITAL_COMMUNITY): Payer: Self-pay

## 2022-09-08 NOTE — Assessment & Plan Note (Signed)
Will use diazepam as needed prior to prolonged test/procedures

## 2022-09-11 ENCOUNTER — Encounter: Payer: Self-pay | Admitting: Internal Medicine

## 2022-09-13 ENCOUNTER — Other Ambulatory Visit (HOSPITAL_COMMUNITY): Payer: Self-pay

## 2022-09-14 ENCOUNTER — Other Ambulatory Visit: Payer: Self-pay | Admitting: Internal Medicine

## 2022-09-14 MED ORDER — GABAPENTIN 300 MG PO CAPS
300.0000 mg | ORAL_CAPSULE | Freq: Every day | ORAL | 3 refills | Status: DC
  Filled 2022-09-14: qty 90, 90d supply, fill #0
  Filled 2022-12-11: qty 90, 90d supply, fill #1
  Filled 2023-01-12 – 2023-02-11 (×3): qty 90, 90d supply, fill #2

## 2022-09-15 ENCOUNTER — Other Ambulatory Visit (HOSPITAL_COMMUNITY): Payer: Self-pay

## 2022-09-16 ENCOUNTER — Other Ambulatory Visit (HOSPITAL_COMMUNITY): Payer: Self-pay

## 2022-09-16 ENCOUNTER — Other Ambulatory Visit: Payer: Self-pay

## 2022-09-18 ENCOUNTER — Other Ambulatory Visit: Payer: Self-pay | Admitting: Internal Medicine

## 2022-09-18 DIAGNOSIS — M9905 Segmental and somatic dysfunction of pelvic region: Secondary | ICD-10-CM | POA: Diagnosis not present

## 2022-09-18 DIAGNOSIS — M9903 Segmental and somatic dysfunction of lumbar region: Secondary | ICD-10-CM | POA: Diagnosis not present

## 2022-09-18 DIAGNOSIS — M5416 Radiculopathy, lumbar region: Secondary | ICD-10-CM | POA: Diagnosis not present

## 2022-09-18 DIAGNOSIS — M9904 Segmental and somatic dysfunction of sacral region: Secondary | ICD-10-CM | POA: Diagnosis not present

## 2022-09-18 DIAGNOSIS — M5442 Lumbago with sciatica, left side: Secondary | ICD-10-CM | POA: Diagnosis not present

## 2022-09-18 DIAGNOSIS — M25652 Stiffness of left hip, not elsewhere classified: Secondary | ICD-10-CM | POA: Diagnosis not present

## 2022-09-18 DIAGNOSIS — M7918 Myalgia, other site: Secondary | ICD-10-CM | POA: Diagnosis not present

## 2022-09-19 ENCOUNTER — Other Ambulatory Visit (HOSPITAL_COMMUNITY): Payer: Self-pay

## 2022-09-19 MED ORDER — TECHLITE PEN NEEDLES 31G X 5 MM MISC
Freq: Every day | 3 refills | Status: DC
  Filled 2022-09-19: qty 100, 90d supply, fill #0

## 2022-09-20 ENCOUNTER — Other Ambulatory Visit (HOSPITAL_COMMUNITY): Payer: Self-pay

## 2022-09-23 ENCOUNTER — Other Ambulatory Visit (HOSPITAL_COMMUNITY): Payer: Self-pay

## 2022-09-23 ENCOUNTER — Other Ambulatory Visit: Payer: Self-pay

## 2022-09-24 ENCOUNTER — Other Ambulatory Visit: Payer: Self-pay | Admitting: Internal Medicine

## 2022-09-24 ENCOUNTER — Other Ambulatory Visit (HOSPITAL_COMMUNITY): Payer: Self-pay

## 2022-09-24 MED ORDER — TECHLITE PEN NEEDLES 31G X 5 MM MISC
Freq: Two times a day (BID) | 3 refills | Status: DC
  Filled 2022-09-24: qty 200, 90d supply, fill #0
  Filled 2022-10-13 – 2022-11-07 (×5): qty 100, 50d supply, fill #0
  Filled 2022-12-15: qty 100, 50d supply, fill #1
  Filled 2023-02-10: qty 100, 50d supply, fill #2
  Filled 2023-03-20 – 2023-03-30 (×2): qty 100, 50d supply, fill #3

## 2022-09-30 ENCOUNTER — Other Ambulatory Visit: Payer: Self-pay | Admitting: Internal Medicine

## 2022-09-30 ENCOUNTER — Other Ambulatory Visit (HOSPITAL_COMMUNITY): Payer: Self-pay

## 2022-09-30 MED ORDER — INSULIN GLARGINE-YFGN 100 UNIT/ML ~~LOC~~ SOPN
20.0000 [IU] | PEN_INJECTOR | Freq: Two times a day (BID) | SUBCUTANEOUS | 2 refills | Status: DC
  Filled 2022-09-30: qty 15, 38d supply, fill #0
  Filled 2022-11-05: qty 15, 38d supply, fill #1
  Filled 2022-12-13: qty 15, 38d supply, fill #2

## 2022-10-14 ENCOUNTER — Other Ambulatory Visit: Payer: Self-pay

## 2022-10-14 ENCOUNTER — Other Ambulatory Visit (HOSPITAL_COMMUNITY): Payer: Self-pay

## 2022-10-17 ENCOUNTER — Other Ambulatory Visit (HOSPITAL_COMMUNITY): Payer: Self-pay

## 2022-10-17 MED ORDER — AMOXICILLIN 500 MG PO CAPS
500.0000 mg | ORAL_CAPSULE | Freq: Three times a day (TID) | ORAL | 0 refills | Status: DC
  Filled 2022-10-17: qty 21, 7d supply, fill #0

## 2022-10-22 ENCOUNTER — Other Ambulatory Visit: Payer: Self-pay | Admitting: Internal Medicine

## 2022-10-22 ENCOUNTER — Other Ambulatory Visit (HOSPITAL_COMMUNITY): Payer: Self-pay

## 2022-10-22 MED ORDER — ICOSAPENT ETHYL 1 G PO CAPS
2.0000 g | ORAL_CAPSULE | Freq: Two times a day (BID) | ORAL | 2 refills | Status: DC
  Filled 2022-10-22: qty 360, 90d supply, fill #0
  Filled 2023-06-03: qty 360, 90d supply, fill #1

## 2022-10-23 ENCOUNTER — Other Ambulatory Visit: Payer: Self-pay

## 2022-10-23 ENCOUNTER — Other Ambulatory Visit (HOSPITAL_COMMUNITY): Payer: Self-pay

## 2022-10-23 MED ORDER — PENICILLIN V POTASSIUM 500 MG PO TABS
500.0000 mg | ORAL_TABLET | Freq: Four times a day (QID) | ORAL | 0 refills | Status: DC
  Filled 2022-10-23: qty 28, 7d supply, fill #0

## 2022-10-23 MED ORDER — OXYCODONE HCL 5 MG PO TABS
5.0000 mg | ORAL_TABLET | Freq: Four times a day (QID) | ORAL | 0 refills | Status: DC | PRN
  Filled 2022-10-23: qty 3, 1d supply, fill #0

## 2022-10-31 ENCOUNTER — Other Ambulatory Visit: Payer: Self-pay

## 2022-10-31 ENCOUNTER — Other Ambulatory Visit (HOSPITAL_COMMUNITY): Payer: Self-pay

## 2022-11-05 ENCOUNTER — Other Ambulatory Visit: Payer: Self-pay | Admitting: Internal Medicine

## 2022-11-05 ENCOUNTER — Other Ambulatory Visit (HOSPITAL_COMMUNITY): Payer: Self-pay

## 2022-11-05 MED ORDER — AMLODIPINE BESYLATE 5 MG PO TABS
5.0000 mg | ORAL_TABLET | Freq: Every day | ORAL | 3 refills | Status: DC
  Filled 2022-11-05: qty 90, 90d supply, fill #0
  Filled 2023-01-12 – 2023-02-07 (×2): qty 90, 90d supply, fill #1
  Filled 2023-05-09: qty 90, 90d supply, fill #2
  Filled 2023-08-05: qty 90, 90d supply, fill #3

## 2022-11-06 ENCOUNTER — Other Ambulatory Visit: Payer: Self-pay

## 2022-11-06 ENCOUNTER — Other Ambulatory Visit (HOSPITAL_COMMUNITY): Payer: Self-pay

## 2022-11-07 ENCOUNTER — Other Ambulatory Visit (HOSPITAL_COMMUNITY): Payer: Self-pay

## 2022-11-20 ENCOUNTER — Other Ambulatory Visit: Payer: Self-pay | Admitting: Internal Medicine

## 2022-11-21 ENCOUNTER — Other Ambulatory Visit (HOSPITAL_COMMUNITY): Payer: Self-pay

## 2022-11-21 ENCOUNTER — Other Ambulatory Visit: Payer: Self-pay

## 2022-11-21 MED ORDER — HYDROCHLOROTHIAZIDE 25 MG PO TABS
25.0000 mg | ORAL_TABLET | Freq: Every day | ORAL | 2 refills | Status: DC
  Filled 2022-11-21: qty 90, 90d supply, fill #0
  Filled 2023-01-12 – 2023-03-01 (×2): qty 90, 90d supply, fill #1
  Filled 2023-05-09 – 2023-06-03 (×2): qty 90, 90d supply, fill #2

## 2022-12-09 ENCOUNTER — Other Ambulatory Visit: Payer: Self-pay

## 2022-12-11 ENCOUNTER — Other Ambulatory Visit: Payer: Self-pay

## 2022-12-13 ENCOUNTER — Other Ambulatory Visit (HOSPITAL_COMMUNITY): Payer: Self-pay

## 2022-12-24 ENCOUNTER — Other Ambulatory Visit (HOSPITAL_COMMUNITY): Payer: Self-pay

## 2023-01-12 ENCOUNTER — Other Ambulatory Visit: Payer: Self-pay | Admitting: Internal Medicine

## 2023-01-13 ENCOUNTER — Other Ambulatory Visit: Payer: Self-pay

## 2023-01-13 ENCOUNTER — Other Ambulatory Visit (HOSPITAL_COMMUNITY): Payer: Self-pay

## 2023-01-13 MED ORDER — INSULIN GLARGINE-YFGN 100 UNIT/ML ~~LOC~~ SOPN
20.0000 [IU] | PEN_INJECTOR | Freq: Two times a day (BID) | SUBCUTANEOUS | 2 refills | Status: DC
  Filled 2023-01-13: qty 15, 38d supply, fill #0
  Filled 2023-02-11: qty 15, 38d supply, fill #1
  Filled 2023-03-20: qty 15, 38d supply, fill #2

## 2023-01-21 DIAGNOSIS — H5203 Hypermetropia, bilateral: Secondary | ICD-10-CM | POA: Diagnosis not present

## 2023-01-21 LAB — HM DIABETES EYE EXAM

## 2023-02-10 ENCOUNTER — Other Ambulatory Visit: Payer: Self-pay

## 2023-02-10 ENCOUNTER — Other Ambulatory Visit: Payer: Self-pay | Admitting: Internal Medicine

## 2023-02-11 ENCOUNTER — Other Ambulatory Visit: Payer: Self-pay

## 2023-02-11 ENCOUNTER — Encounter: Payer: Self-pay | Admitting: Internal Medicine

## 2023-02-11 ENCOUNTER — Other Ambulatory Visit (HOSPITAL_COMMUNITY): Payer: Self-pay

## 2023-02-11 ENCOUNTER — Other Ambulatory Visit: Payer: Self-pay | Admitting: Internal Medicine

## 2023-02-11 MED ORDER — GABAPENTIN 300 MG PO CAPS
300.0000 mg | ORAL_CAPSULE | Freq: Two times a day (BID) | ORAL | 3 refills | Status: DC
  Filled 2023-02-11 – 2023-02-12 (×3): qty 180, 90d supply, fill #0
  Filled 2023-04-22: qty 180, 90d supply, fill #1
  Filled 2023-06-12 – 2023-07-31 (×4): qty 180, 90d supply, fill #2
  Filled 2023-09-08: qty 180, 90d supply, fill #3

## 2023-02-11 MED ORDER — DIAZEPAM 5 MG PO TABS
ORAL_TABLET | ORAL | 1 refills | Status: DC
  Filled 2023-02-11: qty 60, 30d supply, fill #0
  Filled 2023-04-22: qty 60, 30d supply, fill #1

## 2023-02-12 ENCOUNTER — Other Ambulatory Visit: Payer: Self-pay

## 2023-02-12 ENCOUNTER — Other Ambulatory Visit (HOSPITAL_COMMUNITY): Payer: Self-pay

## 2023-02-17 ENCOUNTER — Other Ambulatory Visit (HOSPITAL_COMMUNITY): Payer: Self-pay

## 2023-02-18 DIAGNOSIS — C61 Malignant neoplasm of prostate: Secondary | ICD-10-CM | POA: Diagnosis not present

## 2023-02-25 DIAGNOSIS — N5231 Erectile dysfunction following radical prostatectomy: Secondary | ICD-10-CM | POA: Diagnosis not present

## 2023-02-25 DIAGNOSIS — C61 Malignant neoplasm of prostate: Secondary | ICD-10-CM | POA: Diagnosis not present

## 2023-02-25 DIAGNOSIS — N393 Stress incontinence (female) (male): Secondary | ICD-10-CM | POA: Diagnosis not present

## 2023-02-25 DIAGNOSIS — N492 Inflammatory disorders of scrotum: Secondary | ICD-10-CM | POA: Diagnosis not present

## 2023-03-03 ENCOUNTER — Other Ambulatory Visit (HOSPITAL_COMMUNITY): Payer: Self-pay

## 2023-03-21 ENCOUNTER — Other Ambulatory Visit (HOSPITAL_COMMUNITY): Payer: Self-pay

## 2023-03-21 ENCOUNTER — Other Ambulatory Visit: Payer: Self-pay

## 2023-04-02 ENCOUNTER — Other Ambulatory Visit (HOSPITAL_COMMUNITY): Payer: Self-pay

## 2023-04-02 ENCOUNTER — Other Ambulatory Visit: Payer: Self-pay

## 2023-04-12 DIAGNOSIS — M25571 Pain in right ankle and joints of right foot: Secondary | ICD-10-CM | POA: Diagnosis not present

## 2023-04-12 DIAGNOSIS — M25561 Pain in right knee: Secondary | ICD-10-CM | POA: Diagnosis not present

## 2023-04-17 DIAGNOSIS — M1711 Unilateral primary osteoarthritis, right knee: Secondary | ICD-10-CM | POA: Diagnosis not present

## 2023-04-20 ENCOUNTER — Other Ambulatory Visit: Payer: Self-pay | Admitting: Internal Medicine

## 2023-04-22 ENCOUNTER — Other Ambulatory Visit (HOSPITAL_COMMUNITY): Payer: Self-pay

## 2023-04-22 MED ORDER — INSULIN GLARGINE-YFGN 100 UNIT/ML ~~LOC~~ SOPN
20.0000 [IU] | PEN_INJECTOR | Freq: Two times a day (BID) | SUBCUTANEOUS | 2 refills | Status: DC
Start: 2023-04-22 — End: 2023-07-10
  Filled 2023-04-22: qty 15, 38d supply, fill #0
  Filled 2023-06-02: qty 15, 38d supply, fill #1
  Filled 2023-06-08 – 2023-06-09 (×4): qty 15, 38d supply, fill #2

## 2023-04-30 DIAGNOSIS — M19071 Primary osteoarthritis, right ankle and foot: Secondary | ICD-10-CM | POA: Diagnosis not present

## 2023-05-03 ENCOUNTER — Other Ambulatory Visit: Payer: Self-pay

## 2023-05-03 ENCOUNTER — Other Ambulatory Visit: Payer: Self-pay | Admitting: Internal Medicine

## 2023-05-05 ENCOUNTER — Other Ambulatory Visit (HOSPITAL_COMMUNITY): Payer: Self-pay

## 2023-05-05 ENCOUNTER — Other Ambulatory Visit: Payer: Self-pay

## 2023-05-05 MED ORDER — TECHLITE PEN NEEDLES 31G X 5 MM MISC
Freq: Two times a day (BID) | 3 refills | Status: AC
  Filled 2023-05-05 – 2023-05-19 (×3): qty 100, 50d supply, fill #0
  Filled 2023-07-06: qty 100, 50d supply, fill #1
  Filled 2023-09-13: qty 100, 50d supply, fill #2
  Filled 2023-11-01: qty 100, 50d supply, fill #3

## 2023-05-06 ENCOUNTER — Encounter: Payer: Self-pay | Admitting: Internal Medicine

## 2023-05-06 ENCOUNTER — Ambulatory Visit: Payer: Commercial Managed Care - PPO | Admitting: Internal Medicine

## 2023-05-06 VITALS — BP 122/80 | HR 66 | Temp 98.6°F | Ht 71.0 in | Wt 289.0 lb

## 2023-05-06 DIAGNOSIS — F418 Other specified anxiety disorders: Secondary | ICD-10-CM | POA: Diagnosis not present

## 2023-05-06 DIAGNOSIS — G4733 Obstructive sleep apnea (adult) (pediatric): Secondary | ICD-10-CM | POA: Diagnosis not present

## 2023-05-06 DIAGNOSIS — C61 Malignant neoplasm of prostate: Secondary | ICD-10-CM

## 2023-05-06 DIAGNOSIS — E66813 Obesity, class 3: Secondary | ICD-10-CM

## 2023-05-06 DIAGNOSIS — I10 Essential (primary) hypertension: Secondary | ICD-10-CM

## 2023-05-06 DIAGNOSIS — Z6841 Body Mass Index (BMI) 40.0 and over, adult: Secondary | ICD-10-CM

## 2023-05-06 DIAGNOSIS — Z01818 Encounter for other preprocedural examination: Secondary | ICD-10-CM

## 2023-05-06 DIAGNOSIS — Z794 Long term (current) use of insulin: Secondary | ICD-10-CM | POA: Diagnosis not present

## 2023-05-06 DIAGNOSIS — I2510 Atherosclerotic heart disease of native coronary artery without angina pectoris: Secondary | ICD-10-CM

## 2023-05-06 DIAGNOSIS — E1140 Type 2 diabetes mellitus with diabetic neuropathy, unspecified: Secondary | ICD-10-CM

## 2023-05-06 NOTE — Assessment & Plan Note (Signed)
Will use diazepam as needed prior to prolonged test/procedures

## 2023-05-06 NOTE — Assessment & Plan Note (Signed)
Labs

## 2023-05-06 NOTE — Progress Notes (Signed)
Subjective:  Patient ID: Rodney Hayes, male    DOB: 1951-05-02  Age: 72 y.o. MRN: [id number]  CC: Pre-op Exam   HPI Rodney Hayes presents for R knee pain/OA Is planning to have right knee arthroplasty by Dr Lequita Halt under spinal anesthesia  He is using Dexcom 7, his Dexcom 7 hemoglobin A1c equivalent is within desirable range  Follow-up on hypertension, coronary disease  Outpatient Medications Prior to Visit  Medication Sig Dispense Refill   albuterol (VENTOLIN HFA) 108 (90 Base) MCG/ACT inhaler Inhale 2 puffs into the lungs every 6 (six) hours as needed for wheezing or shortness of breath. 8.5 g 2   amLODipine (NORVASC) 5 MG tablet Take 1 tablet (5 mg total) by mouth daily. 90 tablet 3   aspirin EC 81 MG tablet Take 2 tablets every day by oral route.     chlorhexidine gluconate, MEDLINE KIT, (PERIDEX) 0.12 % solution swish and spit, as directed on bottle. 473 mL 2   Cholecalciferol (VITAMIN D3) 50 MCG (2000 UT) capsule Take 1 capsule (2,000 Units total) by mouth daily. 100 capsule 3   Continuous Glucose Sensor (DEXCOM G7 SENSOR) MISC Replace every 10 days 3 each 11   dapagliflozin propanediol (FARXIGA) 10 MG TABS tablet Take 1 tablet (10 mg total) by mouth daily before breakfast. 30 tablet 11   diazepam (VALIUM) 5 MG tablet Take 1-2 tablets by mouth as needed for procedure anxiety or insomnia. 60 tablet 1   gabapentin (NEURONTIN) 300 MG capsule Take 1 capsule (300 mg total) by mouth 2 (two) times daily. 180 capsule 3   glucose blood (FREESTYLE LITE) test strip Use as directed four times daily 200 each 0   glucose monitoring kit (FREESTYLE) monitoring kit 1 each by Does not apply route as needed for other. 1 each 1   hydrochlorothiazide (HYDRODIURIL) 25 MG tablet Take 1 tablet (25 mg total) by mouth daily. 90 tablet 2   icosapent Ethyl (VASCEPA) 1 g capsule Take 2 capsules (2 g total) by mouth 2 (two) times daily. 360 capsule 2   insulin glargine-yfgn (SEMGLEE, YFGN,) 100 UNIT/ML  Pen Inject 20 Units into the skin 2 (two) times daily. 15 mL 2   Insulin Pen Needle (TECHLITE PEN NEEDLES) 31G X 5 MM MISC Use to administer insulin 2 (two) times daily. 100 each 3   Lancets (FREESTYLE) lancets Use as instructed qid 200 each 12   Lancets Misc. (ACCU-CHEK FASTCLIX LANCET) KIT Use as directed 1 kit 5   losartan (COZAAR) 100 MG tablet Take 1 tablet (100 mg total) by mouth daily 30 tablet 11   minocycline (MINOCIN) 100 MG capsule Take 1 capsule (100 mg total) by mouth 2 (two) times daily. 20 capsule 0   penicillin v potassium (VEETID) 500 MG tablet Take 1 tablet (500 mg total) by mouth 4 (four) times daily. 28 tablet 0   psyllium (METAMUCIL) 58.6 % packet Take 1 packet by mouth daily.     Sodium Fluoride 1.1 % PSTE Use as directed on package 200 mL 11   tadalafil (CIALIS) 20 MG tablet Take 1 tablet (20 mg total) by mouth every evening if needed 30 tablet 11   traMADol (ULTRAM) 50 MG tablet Take 1 tablet (50 mg total) by mouth 3 (three) to 4 (four) times daily. 120 tablet 3   amoxicillin (AMOXIL) 500 MG capsule Take 1 capsule (500 mg total) by mouth 3 (three) times daily for 7 days (Patient not taking: Reported on 05/06/2023) 21 capsule 0  fluticasone-salmeterol (ADVAIR DISKUS) 500-50 MCG/ACT AEPB Inhale 1 puff into the lungs 2 (two) times daily. (Patient not taking: Reported on 07/17/2022) 60 each 2   levalbuterol (XOPENEX) 0.63 MG/3ML nebulizer solution Take 3 mLs (0.63 mg total) by nebulization every 4 (four) hours as needed for wheezing or shortness of breath. (Patient not taking: Reported on 07/17/2022) 120 mL 5   COVID-19 At Home Antigen Test [facility] COVID-19 HOME TEST) KIT Use as directed 4 each 0   COVID-19 mRNA vaccine 2023-2024 (COMIRNATY) SUSP injection Inject into the muscle. 0.3 mL 0   oxyCODONE (OXY IR/ROXICODONE) 5 MG immediate release tablet Take 1 tablet (5 mg total) by mouth every 6 (six) hours as needed for severe post operative pain 3 tablet 0   Facility-Administered  Medications Prior to Visit  Medication Dose Route Frequency Provider Last Rate Last Admin   0.9 %  sodium chloride infusion  500 mL Intravenous Once Iva Boop, MD        ROS: Review of Systems  Constitutional:  Negative for appetite change, fatigue and unexpected weight change.  HENT:  Negative for congestion, nosebleeds, sneezing, sore throat and trouble swallowing.   Eyes:  Negative for itching and visual disturbance.  Respiratory:  Negative for cough.   Cardiovascular:  Negative for chest pain, palpitations and leg swelling.  Gastrointestinal:  Negative for abdominal distention, blood in stool, diarrhea and nausea.  Genitourinary:  Negative for frequency and hematuria.  Musculoskeletal:  Positive for arthralgias and gait problem. Negative for back pain, joint swelling and neck pain.  Skin:  Negative for rash.  Neurological:  Negative for dizziness, tremors, speech difficulty and weakness.  Psychiatric/Behavioral:  Negative for agitation, dysphoric mood and sleep disturbance. The patient is not nervous/anxious.     Objective:  BP 122/80 (BP Location: Left Arm, Patient Position: Sitting, Cuff Size: Normal)   Pulse 66   Temp 98.6 F (37 C) (Oral)   Ht 5\' 11"  (1.803 m)   Wt 289 lb (131.1 kg)   SpO2 95%   BMI 40.31 kg/m   BP Readings from Last 3 Encounters:  05/06/23 122/80  08/27/22 (!) 150/98  07/29/22 (!) 153/87    Wt Readings from Last 3 Encounters:  05/06/23 289 lb (131.1 kg)  08/27/22 (!) 300 lb 9.6 oz (136.4 kg)  07/29/22 290 lb (131.5 kg)    Physical Exam Constitutional:      General: He is not in acute distress.    Appearance: He is well-developed.     Comments: NAD  Eyes:     Conjunctiva/sclera: Conjunctivae normal.     Pupils: Pupils are equal, round, and reactive to light.  Neck:     Thyroid: No thyromegaly.     Vascular: No JVD.  Cardiovascular:     Rate and Rhythm: Normal rate and regular rhythm.     Heart sounds: Normal heart sounds. No  murmur heard.    No friction rub. No gallop.  Pulmonary:     Effort: Pulmonary effort is normal. No respiratory distress.     Breath sounds: Normal breath sounds. No wheezing or rales.  Chest:     Chest wall: No tenderness.  Abdominal:     General: Bowel sounds are normal. There is no distension.     Palpations: Abdomen is soft. There is no mass.     Tenderness: There is no abdominal tenderness. There is no guarding or rebound.  Musculoskeletal:        General: No tenderness. Normal range of  motion.     Cervical back: Normal range of motion.  Lymphadenopathy:     Cervical: No cervical adenopathy.  Skin:    General: Skin is warm and dry.     Findings: No rash.  Neurological:     Mental Status: He is alert and oriented to person, place, and time.     Cranial Nerves: No cranial nerve deficit.     Motor: No abnormal muscle tone.     Coordination: Coordination normal.     Gait: Gait normal.     Deep Tendon Reflexes: Reflexes are normal and symmetric.  Psychiatric:        Behavior: Behavior normal.        Thought Content: Thought content normal.        Judgment: Judgment normal.     Lab Results  Component Value Date   WBC 6.0 08/22/2022   HGB 13.1 08/22/2022   HCT 38.2 (L) 08/22/2022   PLT 218.0 08/22/2022   GLUCOSE 109 (H) 08/22/2022   CHOL 207 (H) 08/22/2022   TRIG 159.0 (H) 08/22/2022   HDL 49.20 08/22/2022   LDLDIRECT 89.0 08/16/2016   LDLCALC 126 (H) 08/22/2022   ALT 21 08/22/2022   AST 20 08/22/2022   NA 141 08/22/2022   K 4.0 08/22/2022   CL 98 08/22/2022   CREATININE 0.83 08/22/2022   BUN 21 08/22/2022   CO2 33 (H) 08/22/2022   TSH 1.52 08/22/2022   PSA 0.00 (L) 08/22/2022   INR 0.91 01/06/2013   HGBA1C 9.3 (H) 08/22/2022   MICROALBUR 55.4 (H) 08/22/2022    DG Chest Port 1 View  Result Date: 07/31/2021 CLINICAL DATA:  COVID positive, ARDS EXAM: PORTABLE CHEST 1 VIEW COMPARISON:  01/14/2017 FINDINGS: Two frontal views of the chest demonstrate mild  enlargement the cardiac silhouette, likely due to AP projection. Basilar predominant interstitial and ground-glass opacities are compatible with given history of COVID-19. Pulmonary edema could give a similar pattern. No effusion or pneumothorax. No acute bony abnormalities. IMPRESSION: 1. Basilar predominant interstitial and ground-glass opacities, favor COVID-19 pneumonia over edema. Electronically Signed   By: Sharlet Salina M.D.   On: 07/31/2021 17:45    Assessment & Plan:   Problem List Items Addressed This Visit     Obstructive sleep apnea   Essential hypertension    Blood pressure is well-controlled.  Continue with Marcelline Deist, HCTZ and losartan BP Readings from Last 3 Encounters:  05/06/23 122/80  08/27/22 (!) 150/98  07/29/22 (!) 153/87         Relevant Medications   aspirin EC 81 MG tablet   Coronary artery disease    No angina.  Good exercise tolerance He is cleared for his right knee surgery under spinal anesthesia      Relevant Medications   aspirin EC 81 MG tablet   Obesity    Wt Readings from Last 3 Encounters:  05/06/23 289 lb (131.1 kg)  08/27/22 (!) 300 lb 9.6 oz (136.4 kg)  07/29/22 290 lb (131.5 kg)  Continue with weight loss effort       Diabetes mellitus (HCC)    Labs      Relevant Medications   aspirin EC 81 MG tablet   Other Relevant Orders   Comprehensive metabolic panel   CBC with Differential/Platelet   Hemoglobin A1c   Urinalysis   Microalbumin / creatinine urine ratio   Lipid panel   TSH   PSA   Prostate cancer (HCC)   Relevant Medications   aspirin EC  81 MG tablet   Other Relevant Orders   PSA   Situational anxiety    Will use diazepam as needed prior to prolonged test/procedures      Other Visit Diagnoses     Preop exam for internal medicine    -  Primary   Relevant Orders   Comprehensive metabolic panel   CBC with Differential/Platelet   Hemoglobin A1c   Urinalysis   Microalbumin / creatinine urine ratio   Lipid panel    TSH         No orders of the defined types were placed in this encounter.     Follow-up: Return in about 6 months (around 11/04/2023).  Sonda Primes, MD

## 2023-05-09 ENCOUNTER — Other Ambulatory Visit (HOSPITAL_COMMUNITY): Payer: Self-pay

## 2023-05-09 ENCOUNTER — Encounter: Payer: Self-pay | Admitting: Internal Medicine

## 2023-05-12 ENCOUNTER — Other Ambulatory Visit (HOSPITAL_COMMUNITY): Payer: Self-pay

## 2023-05-14 NOTE — Assessment & Plan Note (Signed)
No angina.  Good exercise tolerance He is cleared for his right knee surgery under spinal anesthesia

## 2023-05-14 NOTE — Assessment & Plan Note (Addendum)
Blood pressure is well-controlled.  Continue with Rodney Hayes, HCTZ and losartan BP Readings from Last 3 Encounters:  05/06/23 122/80  08/27/22 (!) 150/98  07/29/22 (!) 153/87

## 2023-05-14 NOTE — Assessment & Plan Note (Signed)
Wt Readings from Last 3 Encounters:  05/06/23 289 lb (131.1 kg)  08/27/22 (!) 300 lb 9.6 oz (136.4 kg)  07/29/22 290 lb (131.5 kg)  Continue with weight loss effort

## 2023-05-19 ENCOUNTER — Other Ambulatory Visit: Payer: Self-pay

## 2023-05-19 ENCOUNTER — Other Ambulatory Visit (HOSPITAL_COMMUNITY): Payer: Self-pay

## 2023-05-20 ENCOUNTER — Other Ambulatory Visit (HOSPITAL_COMMUNITY): Payer: Self-pay

## 2023-05-30 DIAGNOSIS — M1711 Unilateral primary osteoarthritis, right knee: Secondary | ICD-10-CM | POA: Diagnosis not present

## 2023-06-02 ENCOUNTER — Telehealth: Payer: Self-pay

## 2023-06-02 DIAGNOSIS — I2510 Atherosclerotic heart disease of native coronary artery without angina pectoris: Secondary | ICD-10-CM

## 2023-06-02 DIAGNOSIS — Z01810 Encounter for preprocedural cardiovascular examination: Secondary | ICD-10-CM

## 2023-06-02 DIAGNOSIS — R931 Abnormal findings on diagnostic imaging of heart and coronary circulation: Secondary | ICD-10-CM

## 2023-06-02 NOTE — Telephone Encounter (Signed)
-----   Message from Charlton Haws sent at 05/30/2023  9:42 PM EST ----- Needs preoperative clearance for knee surgery. Can add on to my reader day schedule of DOD schedules this week He is one of our critical care PA;s

## 2023-06-02 NOTE — Progress Notes (Signed)
CARDIOLOGY CONSULT NOTE       Patient ID: Rodney Hayes MRN: [id number] DOB/AGE: 02/22/1951 72 y.o.  Referring Physician: Plotnicov Primary Physician: Tresa Garter, MD Primary Cardiologist: New Reason for Consultation: Preoperative    HPI:  72 y.o. referred by Dr Posey Rea for preoperative assessment. Patient is to have a total knee replacement with Dr Despina Hick. 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 He has significant arthritis and use to lift heavy weights. CRF;s include HTN and DM.  His LDL is elevated 126 not on statin.   He had a coronary CTA done 04/01/14 with a calcium score of 1847 divided between RCA and LAD , 97 th percentile  Cath not pursued by Dr Shirlee Latch due to lack of symptoms At that point he was on statin/ASA Echo 2017 with normal EF and mild AS mean gradient 10 peak 21 mmhg   He ambulates with limp Still going to [facility] and can due > 5 METS Does bicycle with sprint intervals 40 minutes at a time.  TTE done 06/03/23 EF 60-65% Mild elevated PA pressure likely from OSA severe bi atrial enlargement mild AV sclerosis no AS  He is pending PET/CT test 07/17/23 Note I suspect [name] would be very hesitant to have heart cath for abnormal PET/CT with no "angina"  He told me Dr Teressa Lower initially wanted to cath him with high calcium score but this was never done.    ECG today showed afib CHADVASC 4  He indicates knowing about PAF when he is at doctors office mostly He refuses to take anticoagulation Rates are fine Given age and severe bi atrial enlargement on TTE unlikely to convert Defers AAT as well   Married Wife is a Engineer, civil (consulting) as well and adm for [employer] One daughter in [place] getting NP degree at [employer]   ROS All other systems reviewed and negative except as noted above  Past Medical History:  Diagnosis Date   Allergy    SEASONAL   Arthritis    Asthma    as a child   BPH (benign prostatic hyperplasia)     Cancer (HCC)    prostatectomy 2018 Gleason 7   Cataract    BILATERAL   Diabetes mellitus    average fasting 140s type 2   DJD (degenerative joint disease)    Heart murmur    dr. Gala Romney   Hepatitis    hep B   Hyperlipidemia    Hypertension    Obesity    Sleep apnea    C-PAP    Family History  Problem Relation Age of Onset   Colon cancer Mother    Lung cancer Mother        smoker/lung ca and colon?   Alcohol abuse Mother    Diabetes Mother    Diabetes Father    Coronary artery disease Father        CBAG in late 57s   Cancer Father        prostate   Heart attack Father    Stroke Neg Hx    Colon polyps Neg Hx    Crohn's disease Neg Hx    Esophageal cancer Neg Hx    Rectal cancer Neg Hx    Stomach cancer Neg Hx    Ulcerative colitis Neg Hx     Social History   Socioeconomic History   Marital status: Married    Spouse name: Not on file   Number  of children: Not on file   Years of education: Not on file   Highest education level: Not on file  Occupational History   Not on file  Tobacco Use   Smoking status: Former    Current packs/day: 0.00    Average packs/day: 2.0 packs/day for 30.1 years (60.2 ttl pk-yrs)    Types: Cigarettes    Start date: 07/15/1962    Quit date: 08/15/1992    Years since quitting: 30.8   Smokeless tobacco: Never   Tobacco comments:    smoked 14 -42 up to 2 ppd  Vaping Use   Vaping status: Never Used  Substance and Sexual Activity   Alcohol use: Yes    Alcohol/week: 4.0 standard drinks of alcohol    Types: 4 Glasses of wine per week    Comment: Red Wine   Drug use: No   Sexual activity: Yes  Other Topics Concern   Not on file  Social History Narrative   Not on file   Social Determinants of Health   Financial Resource Strain: Not on file  Food Insecurity: Not on file  Transportation Needs: Not on file  Physical Activity: Not on file  Stress: Not on file  Social Connections: Not on file  Intimate Partner Violence: Not on  file    Past Surgical History:  Procedure Laterality Date   Biceps tendon reinsertion     colonoscopy with polypectomy     adenomatous; Dr Leone Payor   JOINT REPLACEMENT Left    knee   left knee arthroscopy     LYMPHADENECTOMY Bilateral 01/23/2017   Procedure: BILATERAL LYMPHADENECTOMY;  Surgeon: Heloise Purpura, MD;  Location: [facility];  Service: Urology;  Laterality: Bilateral;   PROSTATECTOMY     01/23/17 Dr. Laverle Patter   ROBOT ASSISTED LAPAROSCOPIC RADICAL PROSTATECTOMY N/A 01/23/2017   Procedure: XI ROBOTIC ASSISTED LAPAROSCOPIC RADICAL PROSTATECTOMY LEVEL 3;  Surgeon: Heloise Purpura, MD;  Location: [facility];  Service: Urology;  Laterality: N/A;   TONSILLECTOMY AND ADENOIDECTOMY     TOTAL KNEE ARTHROPLASTY Left 01/11/2013   Procedure: LEFT TOTAL KNEE ARTHROPLASTY;  Surgeon: Loanne Drilling, MD;  Location: [facility];  Service: Orthopedics;  Laterality: Left;   TOTAL SHOULDER ARTHROPLASTY Left 08/12/2014   Procedure: LEFT TOTAL SHOULDER ARTHROPLASTY;  Surgeon: Verlee Rossetti, MD;  Location: [facility] OR;  Service: Orthopedics;  Laterality: Left;   UMBILICAL HERNIA REPAIR        Current Outpatient Medications:    albuterol (VENTOLIN HFA) 108 (90 Base) MCG/ACT inhaler, Inhale 2 puffs into the lungs every 6 (six) hours as needed for wheezing or shortness of breath., Disp: 8.5 g, Rfl: 2   amLODipine (NORVASC) 5 MG tablet, Take 1 tablet (5 mg total) by mouth daily., Disp: 90 tablet, Rfl: 3   amoxicillin (AMOXIL) 500 MG capsule, Take 1 capsule (500 mg total) by mouth 3 (three) times daily for 7 days (Patient not taking: Reported on 05/06/2023), Disp: 21 capsule, Rfl: 0   aspirin EC 81 MG tablet, Take 2 tablets every day by oral route., Disp: , Rfl:    chlorhexidine gluconate, MEDLINE KIT, (PERIDEX) 0.12 % solution, swish and spit, as directed on bottle., Disp: 473 mL, Rfl: 2   Cholecalciferol (VITAMIN D3) 50 MCG (2000 UT) capsule, Take 1 capsule (2,000 Units total) by mouth daily., Disp: 100 capsule, Rfl: 3    Continuous Glucose Sensor (DEXCOM G7 SENSOR) MISC, Replace every 10 days, Disp: 3 each, Rfl: 11   dapagliflozin propanediol (FARXIGA) 10 MG TABS tablet,  Take 1 tablet (10 mg total) by mouth daily before breakfast., Disp: 30 tablet, Rfl: 11   diazepam (VALIUM) 5 MG tablet, Take 1-2 tablets by mouth as needed for procedure anxiety or insomnia., Disp: 60 tablet, Rfl: 1   fluticasone-salmeterol (ADVAIR DISKUS) 500-50 MCG/ACT AEPB, Inhale 1 puff into the lungs 2 (two) times daily. (Patient not taking: Reported on 07/17/2022), Disp: 60 each, Rfl: 2   gabapentin (NEURONTIN) 300 MG capsule, Take 1 capsule (300 mg total) by mouth 2 (two) times daily., Disp: 180 capsule, Rfl: 3   glucose blood (FREESTYLE LITE) test strip, Use as directed four times daily, Disp: 200 each, Rfl: 0   glucose monitoring kit (FREESTYLE) monitoring kit, 1 each by Does not apply route as needed for other., Disp: 1 each, Rfl: 1   hydrochlorothiazide (HYDRODIURIL) 25 MG tablet, Take 1 tablet (25 mg total) by mouth daily., Disp: 90 tablet, Rfl: 2   icosapent Ethyl (VASCEPA) 1 g capsule, Take 2 capsules (2 g total) by mouth 2 (two) times daily., Disp: 360 capsule, Rfl: 2   insulin glargine-yfgn (SEMGLEE, YFGN,) 100 UNIT/ML Pen, Inject 20 Units into the skin 2 (two) times daily., Disp: 15 mL, Rfl: 2   Insulin Pen Needle (TECHLITE PEN NEEDLES) 31G X 5 MM MISC, Use to administer insulin 2 (two) times daily., Disp: 100 each, Rfl: 3   Lancets (FREESTYLE) lancets, Use as instructed qid, Disp: 200 each, Rfl: 12   Lancets Misc. (ACCU-CHEK FASTCLIX LANCET) KIT, Use as directed, Disp: 1 kit, Rfl: 5   levalbuterol (XOPENEX) 0.63 MG/3ML nebulizer solution, Take 3 mLs (0.63 mg total) by nebulization every 4 (four) hours as needed for wheezing or shortness of breath. (Patient not taking: Reported on 07/17/2022), Disp: 120 mL, Rfl: 5   losartan (COZAAR) 100 MG tablet, Take 1 tablet (100 mg total) by mouth daily, Disp: 30 tablet, Rfl: 11   minocycline  (MINOCIN) 100 MG capsule, Take 1 capsule (100 mg total) by mouth 2 (two) times daily., Disp: 20 capsule, Rfl: 0   penicillin v potassium (VEETID) 500 MG tablet, Take 1 tablet (500 mg total) by mouth 4 (four) times daily., Disp: 28 tablet, Rfl: 0   psyllium (METAMUCIL) 58.6 % packet, Take 1 packet by mouth daily., Disp: , Rfl:    Sodium Fluoride 1.1 % PSTE, Use as directed on package, Disp: 200 mL, Rfl: 11   tadalafil (CIALIS) 20 MG tablet, Take 1 tablet (20 mg total) by mouth every evening if needed, Disp: 30 tablet, Rfl: 11   traMADol (ULTRAM) 50 MG tablet, Take 1 tablet (50 mg total) by mouth 3 (three) to 4 (four) times daily., Disp: 120 tablet, Rfl: 3  Current Facility-Administered Medications:    0.9 %  sodium chloride infusion, 500 mL, Intravenous, Once, Iva Boop, MD   sodium chloride      Physical Exam: There were no vitals taken for this visit.     Overweight white male  Lungs clear Cardiac no murmur  Abdomen benign Prior left TKR  Right knee arthritis Plus one bilateral edema with venous insufficiency and pigmentation  Labs:   Lab Results  Component Value Date   WBC 6.0 08/22/2022   HGB 13.1 08/22/2022   HCT 38.2 (L) 08/22/2022   MCV 92.0 08/22/2022   PLT 218.0 08/22/2022   No results for input(s): "NA", "K", "CL", "CO2", "BUN", "CREATININE", "CALCIUM", "PROT", "BILITOT", "ALKPHOS", "ALT", "AST", "GLUCOSE" in the last 168 hours.  Invalid input(s): "LABALBU" No results found for: "CKTOTAL", "CKMB", "  CKMBINDEX", "TROPONINI"  Lab Results  Component Value Date   CHOL 207 (H) 08/22/2022   CHOL 220 (H) 03/26/2021   CHOL 197 09/24/2019   Lab Results  Component Value Date   HDL 49.20 08/22/2022   HDL 53.90 03/26/2021   HDL 48.60 09/24/2019   Lab Results  Component Value Date   LDLCALC 126 (H) 08/22/2022   LDLCALC 139 (H) 03/26/2021   LDLCALC 127 (H) 09/24/2019   Lab Results  Component Value Date   TRIG 159.0 (H) 08/22/2022   TRIG 133.0 03/26/2021    TRIG 105.0 09/24/2019   Lab Results  Component Value Date   CHOLHDL 4 08/22/2022   CHOLHDL 4 03/26/2021   CHOLHDL 4 09/24/2019   Lab Results  Component Value Date   LDLDIRECT 89.0 08/16/2016   LDLDIRECT 69.9 07/28/2012      Radiology: No results found.  EKG: AFib rate 66 inferior T wave changes    ASSESSMENT AND PLAN:   Preoperative:  moderate risk orthopedic surgery. Poorly controlled diabetic last A1c 9.3 with known very high coronary calcium score of  1847, 97 th percentile with at least moderate LAD/RCA stenosis never f/u with stress testing due to lack of symptoms. Shared decision making favor PET/CT to risk stratify prior to surgery Should be on 81 mg ASA and statin Echo with normal EF and no significant valve disease  DM:  Discussed low carb diet.  Target hemoglobin A1c is 6.5 or less.  Continue current medications. HTN; continue norvasc and ARB OSA:  continue CPAP  PAF:  refuses anticoagulation. Not interested in Watchman or AAT rates are fine observe   PET/CT  F/U 07/23/23 post stress testing    Signed: Charlton Haws 06/02/2023, 2:43 PM

## 2023-06-02 NOTE — Addendum Note (Signed)
Addended by: Virl Axe, Matilde Markie L on: 06/02/2023 05:25 PM   Modules accepted: Orders

## 2023-06-02 NOTE — Telephone Encounter (Signed)
  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 New patient for preoperative total knee seeing next week He has known calcium score 1875 9 years ago and mild AS He needs a PET /CT and echo to clear him for surgery Can we get these ordered next week or the week after I need to see him in office before tests done.      Placed order for Echo and PET/CT.  Will send instructions through mychart.

## 2023-06-02 NOTE — Telephone Encounter (Signed)
Made patient an appointment with DOD, Dr. Eden Emms next week. Patient wanted to have Dr. Eden Emms see him.

## 2023-06-03 ENCOUNTER — Ambulatory Visit (HOSPITAL_COMMUNITY): Payer: Commercial Managed Care - PPO | Attending: Cardiovascular Disease

## 2023-06-03 ENCOUNTER — Other Ambulatory Visit (HOSPITAL_COMMUNITY): Payer: Self-pay

## 2023-06-03 ENCOUNTER — Encounter: Payer: Self-pay | Admitting: Cardiovascular Disease

## 2023-06-03 DIAGNOSIS — Z01810 Encounter for preprocedural cardiovascular examination: Secondary | ICD-10-CM | POA: Insufficient documentation

## 2023-06-03 DIAGNOSIS — I2510 Atherosclerotic heart disease of native coronary artery without angina pectoris: Secondary | ICD-10-CM | POA: Diagnosis not present

## 2023-06-03 DIAGNOSIS — R931 Abnormal findings on diagnostic imaging of heart and coronary circulation: Secondary | ICD-10-CM | POA: Insufficient documentation

## 2023-06-03 LAB — ECHOCARDIOGRAM COMPLETE
Area-P 1/2: 4.83 cm2
P 1/2 time: 217 ms
S' Lateral: 3.4 cm

## 2023-06-05 ENCOUNTER — Other Ambulatory Visit: Payer: Self-pay | Admitting: Internal Medicine

## 2023-06-05 DIAGNOSIS — Z1211 Encounter for screening for malignant neoplasm of colon: Secondary | ICD-10-CM

## 2023-06-08 ENCOUNTER — Other Ambulatory Visit: Payer: Self-pay

## 2023-06-09 ENCOUNTER — Other Ambulatory Visit (HOSPITAL_COMMUNITY): Payer: Self-pay

## 2023-06-10 ENCOUNTER — Encounter: Payer: Self-pay | Admitting: Cardiovascular Disease

## 2023-06-10 ENCOUNTER — Ambulatory Visit: Payer: Commercial Managed Care - PPO | Attending: Cardiovascular Disease | Admitting: Cardiovascular Disease

## 2023-06-10 VITALS — BP 154/92 | HR 56 | Ht 71.0 in | Wt 295.0 lb

## 2023-06-10 DIAGNOSIS — R931 Abnormal findings on diagnostic imaging of heart and coronary circulation: Secondary | ICD-10-CM

## 2023-06-10 DIAGNOSIS — Z01810 Encounter for preprocedural cardiovascular examination: Secondary | ICD-10-CM

## 2023-06-10 DIAGNOSIS — I480 Paroxysmal atrial fibrillation: Secondary | ICD-10-CM | POA: Diagnosis not present

## 2023-06-10 NOTE — Patient Instructions (Signed)
Medication Instructions:  Your physician recommends that you continue on your current medications as directed. Please refer to the Current Medication list given to you today.  *If you need a refill on your cardiac medications before your next appointment, please call your pharmacy*   Lab Work: NONE If you have labs (blood work) drawn today and your tests are completely normal, you will receive your results only by: MyChart Message (if you have MyChart) OR A paper copy in the mail If you have any lab test that is abnormal or we need to change your treatment, we will call you to review the results.   Testing/Procedures: NONE   Follow-Up:As scheduled  At [facility], you and your health needs are our priority.  As part of our continuing mission to provide you with exceptional heart care, we have created designated Provider Care Teams.  These Care Teams include your primary Cardiologist (physician) and Advanced Practice Providers (APPs -  Physician Assistants and Nurse Practitioners) who all work together to provide you with the care you need, when you need it.  We recommend signing up for the patient portal called "MyChart".  Sign up information is provided on this After Visit Summary.  MyChart is used to connect with patients for Virtual Visits (Telemedicine).  Patients are able to view lab/test results, encounter notes, upcoming appointments, etc.  Non-urgent messages can be sent to your provider as well.   To learn more about what you can do with MyChart, go to [email or web address].     Provider:   Charlton Haws, MD

## 2023-06-12 ENCOUNTER — Other Ambulatory Visit: Payer: Self-pay | Admitting: Internal Medicine

## 2023-06-13 ENCOUNTER — Other Ambulatory Visit: Payer: Self-pay

## 2023-06-13 ENCOUNTER — Other Ambulatory Visit (HOSPITAL_COMMUNITY): Payer: Self-pay

## 2023-06-15 MED ORDER — DIAZEPAM 5 MG PO TABS
5.0000 mg | ORAL_TABLET | ORAL | 1 refills | Status: DC | PRN
Start: 2023-06-15 — End: 2023-09-26
  Filled 2023-06-15: qty 60, 30d supply, fill #0
  Filled 2023-07-31: qty 60, 30d supply, fill #1

## 2023-06-16 ENCOUNTER — Other Ambulatory Visit (HOSPITAL_COMMUNITY): Payer: Self-pay

## 2023-06-16 ENCOUNTER — Other Ambulatory Visit: Payer: Self-pay

## 2023-06-17 ENCOUNTER — Other Ambulatory Visit: Payer: Self-pay

## 2023-06-18 ENCOUNTER — Other Ambulatory Visit (HOSPITAL_COMMUNITY): Payer: Self-pay

## 2023-07-07 ENCOUNTER — Other Ambulatory Visit (HOSPITAL_COMMUNITY): Payer: Self-pay

## 2023-07-08 ENCOUNTER — Other Ambulatory Visit (HOSPITAL_COMMUNITY): Payer: Self-pay

## 2023-07-08 ENCOUNTER — Other Ambulatory Visit: Payer: Self-pay | Admitting: Internal Medicine

## 2023-07-08 MED ORDER — TRAMADOL HCL 50 MG PO TABS
50.0000 mg | ORAL_TABLET | Freq: Four times a day (QID) | ORAL | 3 refills | Status: DC
Start: 2023-07-08 — End: 2023-08-22
  Filled 2023-07-08: qty 120, 30d supply, fill #0
  Filled 2023-08-05: qty 120, 30d supply, fill #1
  Filled 2023-08-15: qty 120, 30d supply, fill #2

## 2023-07-10 ENCOUNTER — Other Ambulatory Visit: Payer: Self-pay | Admitting: Internal Medicine

## 2023-07-11 ENCOUNTER — Other Ambulatory Visit (HOSPITAL_COMMUNITY): Payer: Self-pay

## 2023-07-11 MED ORDER — INSULIN GLARGINE-YFGN 100 UNIT/ML ~~LOC~~ SOPN
20.0000 [IU] | PEN_INJECTOR | Freq: Two times a day (BID) | SUBCUTANEOUS | 2 refills | Status: DC
  Filled 2023-07-11 – 2023-07-13 (×2): qty 15, 38d supply, fill #0

## 2023-07-13 ENCOUNTER — Other Ambulatory Visit (HOSPITAL_COMMUNITY): Payer: Self-pay

## 2023-07-14 ENCOUNTER — Other Ambulatory Visit: Payer: Self-pay

## 2023-07-14 ENCOUNTER — Encounter (HOSPITAL_COMMUNITY): Payer: Self-pay

## 2023-07-14 DIAGNOSIS — Z01810 Encounter for preprocedural cardiovascular examination: Secondary | ICD-10-CM

## 2023-07-14 DIAGNOSIS — R931 Abnormal findings on diagnostic imaging of heart and coronary circulation: Secondary | ICD-10-CM

## 2023-07-14 DIAGNOSIS — I2510 Atherosclerotic heart disease of native coronary artery without angina pectoris: Secondary | ICD-10-CM

## 2023-07-14 DIAGNOSIS — M1711 Unilateral primary osteoarthritis, right knee: Secondary | ICD-10-CM | POA: Diagnosis not present

## 2023-07-14 NOTE — Progress Notes (Signed)
Place order for attestation, will forward to Dr. Eden Emms to sign.

## 2023-07-17 ENCOUNTER — Ambulatory Visit
Admission: RE | Admit: 2023-07-17 | Discharge: 2023-07-17 | Disposition: A | Discharge status: Home / Self Care | Payer: Commercial Managed Care - PPO | Source: Ambulatory Visit | Attending: Cardiovascular Disease | Admitting: Cardiovascular Disease

## 2023-07-17 DIAGNOSIS — R931 Abnormal findings on diagnostic imaging of heart and coronary circulation: Secondary | ICD-10-CM

## 2023-07-17 DIAGNOSIS — I272 Pulmonary hypertension, unspecified: Secondary | ICD-10-CM | POA: Insufficient documentation

## 2023-07-17 DIAGNOSIS — I700 Atherosclerosis of aorta: Secondary | ICD-10-CM | POA: Insufficient documentation

## 2023-07-17 DIAGNOSIS — Z01810 Encounter for preprocedural cardiovascular examination: Secondary | ICD-10-CM

## 2023-07-17 DIAGNOSIS — I2510 Atherosclerotic heart disease of native coronary artery without angina pectoris: Secondary | ICD-10-CM | POA: Diagnosis not present

## 2023-07-17 LAB — NM PET CT CARDIAC PERFUSION MULTI W/ABSOLUTE BLOODFLOW
MBFR: 2.13
Nuc Rest EF: 51 %
Nuc Stress EF: 57 %
Peak HR: 92 {beats}/min
Rest HR: 83 {beats}/min
Rest MBF: 0.91 ml/g/min
Rest Nuclear Isotope Dose: 25.1 mCi
SRS: 0
SSS: 0
ST Depression (mm): 0 mm
Stress MBF: 1.94 ml/g/min
Stress Nuclear Isotope Dose: 25.1 mCi
TID: 1.04

## 2023-07-17 MED ORDER — REGADENOSON 0.4 MG/5ML IV SOLN
INTRAVENOUS | Status: AC
  Filled 2023-07-17: qty 5

## 2023-07-17 MED ORDER — REGADENOSON 0.4 MG/5ML IV SOLN
0.4000 mg | Freq: Once | INTRAVENOUS | Status: AC
  Administered 2023-07-17: 0.4 mg via INTRAVENOUS
  Filled 2023-07-17: qty 5

## 2023-07-17 MED ORDER — RUBIDIUM RB82 GENERATOR (RUBYFILL)
25.0000 | PACK | Freq: Once | INTRAVENOUS | Status: AC
  Administered 2023-07-17: 25.05 via INTRAVENOUS

## 2023-07-17 MED ORDER — RUBIDIUM RB82 GENERATOR (RUBYFILL)
25.0000 | PACK | Freq: Once | INTRAVENOUS | Status: AC
  Administered 2023-07-17: 25.13 via INTRAVENOUS

## 2023-07-17 NOTE — Progress Notes (Signed)
 Patient presents for a cardiac PET stress test and tolerated procedure without incident. Patient maintained acceptable vital signs throughout the test and was offered caffeine after test.  Patient ambulated out of department with a steady gait.

## 2023-07-23 NOTE — Progress Notes (Signed)
 CARDIOLOGY CONSULT NOTE       Patient ID: Rodney Hayes MRN: [id number] DOB/AGE: 1950/08/19 73 y.o.  Referring Physician: Plotnicov Primary Physician: Genia Kettering, MD Primary Cardiologist: Stann Earnest Reason for Consultation: Preoperative    HPI:  73 y.o. referred by Dr Georgia Kipper for preoperative assessment. Patient is to have a total knee replacement with Dr Rossie Coon. 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 He has significant arthritis and use to lift heavy weights. CRF;s include HTN and DM.  His LDL is elevated 126 not on statin.   He had a coronary CTA done 04/01/14 with a calcium  score of 1847 divided between RCA and LAD , 97 th percentile  Cath not pursued by Dr Mitzie Anda due to lack of symptoms At that point he was on statin/ASA Echo 2017 with normal EF and mild AS mean gradient 10 peak 21 mmhg   He ambulates with limp Still going to [employer] and can due > 5 METS Does bicycle with sprint intervals 40 minutes at a time.  TTE done 06/03/23 EF 60-65% Mild elevated PA pressure likely from OSA severe bi atrial enlargement mild AV sclerosis no AS  PET/CT done 07/17/23 with no ischemia, normal myocardial blood flow reserve normal stress EF 57% no TID   ECG 11/26  showed afib CHADVASC 4 Patient deferred any anticoagulation   He indicates knowing about PAF when he is at doctors office mostly He refuses to take anticoagulation Rates are fine Given age and severe bi atrial enlargement on TTE unlikely to convert Defers AAT as well   Married Wife is a Engineer, civil (consulting) as well and adm for [employer] One daughter in [place] getting NP degree at [employer]   He will schedule his TKR with Dr France Ina for February. I suspect we will need to be consulted for afib rate control post operatively. He is moderate risk given body habitus, lung dx, aifb. Reassuring that PET CT with no ischemia and EF normal.    Unfortunately he found out last week that they will not  operate on him unless his weight is 275 lbs or less  His BMI is right at 40 He is still going to gym, using bicycle and working out with trainer as best he can. He has lost weight in past with intermittent fasting and will start this now   ROS All other systems reviewed and negative except as noted above  Past Medical History:  Diagnosis Date   Allergy    SEASONAL   Arthritis    Asthma    as a child   BPH (benign prostatic hyperplasia)    Cancer (HCC)    prostatectomy 2018 Gleason 7   Cataract    BILATERAL   Diabetes mellitus    average fasting 140s type 2   DJD (degenerative joint disease)    Heart murmur    dr. Julane Ny   Hepatitis    hep B   Hyperlipidemia    Hypertension    Obesity    Sleep apnea    C-PAP    Family History  Problem Relation Age of Onset   Colon cancer Mother    Lung cancer Mother        smoker/lung ca and colon?   Alcohol abuse Mother    Diabetes Mother    Diabetes Father    Coronary artery disease Father        CBAG in late 67s   Cancer Father  prostate   Heart attack Father    Stroke Neg Hx    Colon polyps Neg Hx    Crohn's disease Neg Hx    Esophageal cancer Neg Hx    Rectal cancer Neg Hx    Stomach cancer Neg Hx    Ulcerative colitis Neg Hx     Social History   Socioeconomic History   Marital status: Married    Spouse name: Not on file   Number of children: Not on file   Years of education: Not on file   Highest education level: Not on file  Occupational History   Not on file  Tobacco Use   Smoking status: Former    Current packs/day: 0.00    Average packs/day: 2.0 packs/day for 30.1 years (60.2 ttl pk-yrs)    Types: Cigarettes    Start date: 07/15/1962    Quit date: 08/15/1992    Years since quitting: 30.9   Smokeless tobacco: Never   Tobacco comments:    smoked 14 -42 up to 2 ppd  Vaping Use   Vaping status: Never Used  Substance and Sexual Activity   Alcohol use: Yes    Alcohol/week: 4.0 standard drinks of  alcohol    Types: 4 Glasses of wine per week    Comment: Red Wine   Drug use: No   Sexual activity: Yes  Other Topics Concern   Not on file  Social History Narrative   Not on file   Social Drivers of Health   Financial Resource Strain: Not on file  Food Insecurity: Not on file  Transportation Needs: Not on file  Physical Activity: Not on file  Stress: Not on file  Social Connections: Not on file  Intimate Partner Violence: Not on file    Past Surgical History:  Procedure Laterality Date   Biceps tendon reinsertion     colonoscopy with polypectomy     adenomatous; Dr Willy Harvest   JOINT REPLACEMENT Left    knee   left knee arthroscopy     LYMPHADENECTOMY Bilateral 01/23/2017   Procedure: BILATERAL LYMPHADENECTOMY;  Surgeon: Florencio Hunting, MD;  Location: [facility];  Service: Urology;  Laterality: Bilateral;   PROSTATECTOMY     01/23/17 Dr. Rozanne Corners   ROBOT ASSISTED LAPAROSCOPIC RADICAL PROSTATECTOMY N/A 01/23/2017   Procedure: XI ROBOTIC ASSISTED LAPAROSCOPIC RADICAL PROSTATECTOMY LEVEL 3;  Surgeon: Florencio Hunting, MD;  Location: [facility];  Service: Urology;  Laterality: N/A;   TONSILLECTOMY AND ADENOIDECTOMY     TOTAL KNEE ARTHROPLASTY Left 01/11/2013   Procedure: LEFT TOTAL KNEE ARTHROPLASTY;  Surgeon: Aurther Blue, MD;  Location: [facility];  Service: Orthopedics;  Laterality: Left;   TOTAL SHOULDER ARTHROPLASTY Left 08/12/2014   Procedure: LEFT TOTAL SHOULDER ARTHROPLASTY;  Surgeon: Lorriane Rote, MD;  Location[facility];  Service: Orthopedics;  Laterality: Left;   UMBILICAL HERNIA REPAIR        Current Outpatient Medications:    albuterol  (VENTOLIN  HFA) 108 (90 Base) MCG/ACT inhaler, Inhale 2 puffs into the lungs every 6 (six) hours as needed for wheezing or shortness of breath., Disp: 8.5 g, Rfl: 2   amLODipine  (NORVASC ) 5 MG tablet, Take 1 tablet (5 mg total) by mouth daily., Disp: 90 tablet, Rfl: 3   aspirin  EC 81 MG tablet, Take 2 tablets every day by oral route., Disp: , Rfl:     chlorhexidine  gluconate, MEDLINE KIT, (PERIDEX ) 0.12 % solution, swish and spit, as directed on bottle., Disp: 473 mL, Rfl: 2   Cholecalciferol (VITAMIN  D3) 50 MCG (2000 UT) capsule, Take 1 capsule (2,000 Units total) by mouth daily., Disp: 100 capsule, Rfl: 3   Continuous Glucose Sensor (DEXCOM G7 SENSOR) MISC, Replace every 10 days, Disp: 3 each, Rfl: 11   dapagliflozin  propanediol (FARXIGA ) 10 MG TABS tablet, Take 1 tablet (10 mg total) by mouth daily before breakfast., Disp: 30 tablet, Rfl: 11   diazepam  (VALIUM ) 5 MG tablet, Take 1-2 tablets by mouth as needed for procedure anxiety or insomnia., Disp: 60 tablet, Rfl: 1   fluticasone -salmeterol (ADVAIR  DISKUS) 500-50 MCG/ACT AEPB, Inhale 1 puff into the lungs 2 (two) times daily., Disp: 60 each, Rfl: 2   gabapentin  (NEURONTIN ) 300 MG capsule, Take 1 capsule (300 mg total) by mouth 2 (two) times daily., Disp: 180 capsule, Rfl: 3   glucose blood (FREESTYLE LITE) test strip, Use as directed four times daily, Disp: 200 each, Rfl: 0   glucose monitoring kit (FREESTYLE) monitoring kit, 1 each by Does not apply route as needed for other., Disp: 1 each, Rfl: 1   hydrochlorothiazide  (HYDRODIURIL ) 25 MG tablet, Take 1 tablet (25 mg total) by mouth daily., Disp: 90 tablet, Rfl: 2   icosapent  Ethyl (VASCEPA ) 1 g capsule, Take 2 capsules (2 g total) by mouth 2 (two) times daily., Disp: 360 capsule, Rfl: 2   insulin  glargine-yfgn (SEMGLEE , YFGN,) 100 UNIT/ML Pen, Inject 20 Units into the skin 2 (two) times daily., Disp: 15 mL, Rfl: 2   Insulin  Pen Needle (TECHLITE PEN NEEDLES) 31G X 5 MM MISC, Use to administer insulin  2 (two) times daily., Disp: 100 each, Rfl: 3   Lancets (FREESTYLE) lancets, Use as instructed qid, Disp: 200 each, Rfl: 12   Lancets Misc. (ACCU-CHEK FASTCLIX LANCET) KIT, Use as directed, Disp: 1 kit, Rfl: 5   levalbuterol  (XOPENEX ) 0.63 MG/3ML nebulizer solution, Take 3 mLs (0.63 mg total) by nebulization every 4 (four) hours as needed for  wheezing or shortness of breath., Disp: 120 mL, Rfl: 5   losartan  (COZAAR ) 100 MG tablet, Take 1 tablet (100 mg total) by mouth daily, Disp: 30 tablet, Rfl: 11   psyllium (METAMUCIL) 58.6 % packet, Take 1 packet by mouth daily., Disp: , Rfl:    Sodium Fluoride  1.1 % PSTE, Use as directed on package, Disp: 200 mL, Rfl: 11   tadalafil  (CIALIS ) 20 MG tablet, Take 1 tablet (20 mg total) by mouth every evening if needed, Disp: 30 tablet, Rfl: 11   traMADol  (ULTRAM ) 50 MG tablet, Take 1 tablet (50 mg total) by mouth 3 (three) to 4 (four) times daily., Disp: 120 tablet, Rfl: 3  Current Facility-Administered Medications:    0.9 %  sodium chloride  infusion, 500 mL, Intravenous, Once, Kenney Peacemaker, MD   sodium chloride       Physical Exam: Blood pressure (!) 148/76, pulse 65, height 5\' 11"  (1.803 m), weight 296 lb (134.3 kg), SpO2 93%.     Overweight white male  Lungs clear Cardiac no murmur  Abdomen benign Prior left TKR  Right knee arthritis Plus one bilateral edema with venous insufficiency and pigmentation  Labs:   Lab Results  Component Value Date   WBC 6.0 08/22/2022   HGB 13.1 08/22/2022   HCT 38.2 (L) 08/22/2022   MCV 92.0 08/22/2022   PLT 218.0 08/22/2022   No results for input(s): "NA", "K", "CL", "CO2", "BUN", "CREATININE", "CALCIUM ", "PROT", "BILITOT", "ALKPHOS", "ALT", "AST", "GLUCOSE" in the last 168 hours.  Invalid input(s): "LABALBU" No results found for: "CKTOTAL", "CKMB", "CKMBINDEX", "TROPONINI"  Lab Results  Component Value Date   CHOL 207 (H) 08/22/2022   CHOL 220 (H) 03/26/2021   CHOL 197 09/24/2019   Lab Results  Component Value Date   HDL 49.20 08/22/2022   HDL 53.90 03/26/2021   HDL 48.60 09/24/2019   Lab Results  Component Value Date   LDLCALC 126 (H) 08/22/2022   LDLCALC 139 (H) 03/26/2021   LDLCALC 127 (H) 09/24/2019   Lab Results  Component Value Date   TRIG 159.0 (H) 08/22/2022   TRIG 133.0 03/26/2021   TRIG 105.0 09/24/2019   Lab  Results  Component Value Date   CHOLHDL 4 08/22/2022   CHOLHDL 4 03/26/2021   CHOLHDL 4 09/24/2019   Lab Results  Component Value Date   LDLDIRECT 89.0 08/16/2016   LDLDIRECT 69.9 07/28/2012      Radiology: NM PET CT CARDIAC PERFUSION MULTI W/ABSOLUTE BLOODFLOW Result Date: 07/17/2023   The study is normal. The study is low risk.   LV perfusion is normal. There is no evidence of ischemia. There is no evidence of infarction.   ECG rhythm shows atrial fibrillation.   No ST deviation was noted. T-wave inversion was noted with pharmacologic stress. T wave inversion began at 1 minutes of stress. T wave inversion returned to baseline after less than 1 min of recovery. Arrhythmias during recovery: occasional PVCs. The ECG was not diagnostic due to pharmacologic protocol.   Rest left ventricular function is normal. Rest EF: 51%. Stress left ventricular function is normal. Stress EF: 57%. End diastolic cavity size is moderately enlarged. End systolic cavity size is mildly enlarged. These may be subject to gating error in atrial fibrillation with PVCs. No evidence of transient ischemic dilation (TID) noted.   Myocardial blood flow was computed to be 0.91ml/g/min at rest and 1.94ml/g/min at stress. Global myocardial blood flow reserve was 2.13 and was normal.   Coronary calcium  was present on the attenuation correction CT images. Severe coronary calcifications were present. Coronary calcifications were present in the left anterior descending artery, left circumflex artery and right coronary artery distribution(s). Moderate aortic valve calcifications.   Electronically signed by: Euell Herrlich, MD CLINICAL DATA:  This over-read does not include interpretation of cardiac or coronary anatomy or pathology. The Cardiac PET CT interpretation by the cardiologist is attached. COMPARISON:  None Available. FINDINGS: Cardiovascular: Aortic atherosclerosis. Cardiomegaly. Three-vessel coronary artery calcifications.  Enlargement of the main pulmonary artery, measuring up to 3.9 cm. No pericardial effusion. Limited Mediastinum/Nodes: No enlarged mediastinal, hilar, or axillary lymph nodes. Trachea and esophagus demonstrate no significant findings. Limited Lungs/Pleura: Diffuse bilateral bronchial wall thickening. No pleural effusion or pneumothorax. Upper Abdomen: No acute abnormality. Musculoskeletal: No chest wall abnormality. No acute osseous findings. IMPRESSION: 1. Diffuse bilateral bronchial wall thickening, consistent with nonspecific infectious or inflammatory bronchitis. 2. Cardiomegaly and coronary artery disease. 3. Enlargement of the main pulmonary artery, as can be seen in pulmonary hypertension. Aortic Atherosclerosis (ICD10-I70.0). Electronically Signed   By: Fredricka Jenny M.D.   On: 07/17/2023 14:01   EKG: AFib rate 66 inferior T wave changes    ASSESSMENT AND PLAN:   Preoperative:  moderate risk orthopedic surgery. Poorly controlled diabetic last A1c 9.3 with known very high coronary calcium  score of  1847, 97 th percentile with at least moderate LAD/RCA stenosis PET/CT non ischemic with no TID, normal MBFR and stress EF 57%. If he has a lot of pain or anemia post op will likely need some afib rate control  DM:  Discussed low carb diet.  Target hemoglobin A1c is 6.5 or less.  Continue current medications. HTN; continue norvasc  and ARB OSA:  continue CPAP  PAF:  refuses anticoagulation. Not interested in Watchman or AAT rates are fine observe   Clear to have TKR February with known risks Will be available to consult at [facility] Have asked Dr Asenath Blacker to review case to see if there is any way to proceed with surgery  Desptie weight/BMI given patients pain   F/U 3 months    Signed: Janelle Mediate 07/30/2023, 1:56 PM

## 2023-07-30 ENCOUNTER — Ambulatory Visit: Payer: Commercial Managed Care - PPO | Attending: Cardiovascular Disease | Admitting: Cardiovascular Disease

## 2023-07-30 VITALS — BP 148/76 | HR 65 | Ht 71.0 in | Wt 296.0 lb

## 2023-07-30 DIAGNOSIS — R931 Abnormal findings on diagnostic imaging of heart and coronary circulation: Secondary | ICD-10-CM | POA: Diagnosis not present

## 2023-07-30 DIAGNOSIS — Z01810 Encounter for preprocedural cardiovascular examination: Secondary | ICD-10-CM

## 2023-07-30 DIAGNOSIS — I480 Paroxysmal atrial fibrillation: Secondary | ICD-10-CM

## 2023-07-30 NOTE — Patient Instructions (Signed)
 Medication Instructions:  Your physician recommends that you continue on your current medications as directed. Please refer to the Current Medication list given to you today.  *If you need a refill on your cardiac medications before your next appointment, please call your pharmacy*  Lab Work: If you have labs (blood work) drawn today and your tests are completely normal, you will receive your results only by: MyChart Message (if you have MyChart) OR A paper copy in the mail If you have any lab test that is abnormal or we need to change your treatment, we will call you to review the results.  Testing/Procedures: None ordered today.  Follow-Up: At [facility], you and your health needs are our priority.  As part of our continuing mission to provide you with exceptional heart care, we have created designated Provider Care Teams.  These Care Teams include your primary Cardiologist (physician) and Advanced Practice Providers (APPs -  Physician Assistants and Nurse Practitioners) who all work together to provide you with the care you need, when you need it.  We recommend signing up for the patient portal called "MyChart".  Sign up information is provided on this After Visit Summary.  MyChart is used to connect with patients for Virtual Visits (Telemedicine).  Patients are able to view lab/test results, encounter notes, upcoming appointments, etc.  Non-urgent messages can be sent to your provider as well.   To learn more about what you can do with MyChart, go to [email or web address].    Your next appointment:   3 month(s)  Provider:   Janelle Mediate, MD     Other Instructions

## 2023-07-31 ENCOUNTER — Other Ambulatory Visit: Payer: Self-pay | Admitting: Internal Medicine

## 2023-07-31 ENCOUNTER — Other Ambulatory Visit (HOSPITAL_COMMUNITY): Payer: Self-pay

## 2023-07-31 ENCOUNTER — Other Ambulatory Visit (INDEPENDENT_AMBULATORY_CARE_PROVIDER_SITE_OTHER): Payer: Commercial Managed Care - PPO

## 2023-07-31 ENCOUNTER — Other Ambulatory Visit: Payer: Self-pay

## 2023-07-31 ENCOUNTER — Encounter: Payer: Self-pay | Admitting: Orthopaedic Surgery

## 2023-07-31 ENCOUNTER — Ambulatory Visit: Payer: Commercial Managed Care - PPO | Admitting: Orthopaedic Surgery

## 2023-07-31 DIAGNOSIS — M25561 Pain in right knee: Secondary | ICD-10-CM

## 2023-07-31 DIAGNOSIS — G8929 Other chronic pain: Secondary | ICD-10-CM

## 2023-07-31 DIAGNOSIS — M1711 Unilateral primary osteoarthritis, right knee: Secondary | ICD-10-CM

## 2023-07-31 MED ORDER — LOSARTAN POTASSIUM 100 MG PO TABS
100.0000 mg | ORAL_TABLET | Freq: Every day | ORAL | 11 refills | Status: AC
  Filled 2023-07-31 – 2023-08-14 (×2): qty 30, 30d supply, fill #0
  Filled 2023-09-13: qty 30, 30d supply, fill #1
  Filled 2023-10-13: qty 30, 30d supply, fill #2
  Filled 2023-11-12: qty 30, 30d supply, fill #3
  Filled 2023-12-15: qty 30, 30d supply, fill #4
  Filled 2024-01-12: qty 30, 30d supply, fill #5
  Filled 2024-02-27: qty 30, 30d supply, fill #6
  Filled 2024-04-03: qty 30, 30d supply, fill #7
  Filled 2024-04-07: qty 30, 30d supply, fill #0
  Filled 2024-05-03: qty 30, 30d supply, fill #1
  Filled 2024-06-01: qty 30, 30d supply, fill #2
  Filled 2024-07-01: qty 30, 30d supply, fill #3
  Filled 2024-07-27: qty 30, 30d supply, fill #4

## 2023-07-31 MED ORDER — DAPAGLIFLOZIN PROPANEDIOL 10 MG PO TABS
10.0000 mg | ORAL_TABLET | Freq: Every day | ORAL | 11 refills | Status: DC
  Filled 2023-07-31 – 2023-08-14 (×2): qty 30, 30d supply, fill #0
  Filled 2023-09-13: qty 30, 30d supply, fill #1
  Filled 2023-10-13: qty 30, 30d supply, fill #2
  Filled 2023-11-01 – 2023-11-06 (×2): qty 30, 30d supply, fill #3
  Filled 2023-12-11: qty 30, 30d supply, fill #4
  Filled 2023-12-25 – 2024-01-08 (×2): qty 30, 30d supply, fill #5
  Filled 2024-02-04: qty 30, 30d supply, fill #6
  Filled 2024-03-01: qty 30, 30d supply, fill #7
  Filled 2024-04-03: qty 30, 30d supply, fill #8
  Filled 2024-04-07: qty 30, 30d supply, fill #0
  Filled 2024-05-03: qty 30, 30d supply, fill #1
  Filled 2024-06-01: qty 30, 30d supply, fill #2
  Filled 2024-06-28: qty 30, 30d supply, fill #3

## 2023-07-31 NOTE — Progress Notes (Signed)
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  He has severe debilitating arthritis involving his right knee that has been well-documented.  He is a diabetic but is able to control his blood sugars under better basis now with a hemoglobin A1c on his phone just under 7 today.  He has debilitating knee pain that is detrimentally affecting his mobility, his quality of life and his actives daily living.  He has a history of a left total knee arthroplasty done about 10 years ago by one of our colleagues in town.  His BMI is 41.28 but he does not have a large knee.  He has a remote history of a right distal tib-fib fracture and that does throw off his gait as well.  He is followed closely by Dr. Charlton Haws from cardiology who is provided a preoperative assessment and evaluation of Rodney Hayes.  He is ready to proceed with knee replacement surgery he states.  He currently denies a headache, chest pain, shortness of breath, fever, chills, nausea, vomiting.  X-rays of his right knee are shared with him today.  He has complete loss of joint space and bone-on-bone wear of all 3 compartments with osteophytes in all 3 compartments.  There is slight valgus malalignment.  Examination of his right knee shows a mild effusion.  He has good range of motion of the knee with slight valgus malalignment.  It feels ligamentously stable but is very painful throughout the arc of motion and painful with both medial lateral joint lines in the patellofemoral joint.  There is a lot of crepitation as well.  We had a long thorough discussion about knee replacement surgery.  Having had this before he is fully aware of the risk and benefits of the surgery and what to expect from an intraoperative and postoperative standpoint.  We will work on getting him on the schedule in the near future and I would likely potentially have him on a low-dose of Eliquis  twice daily after surgery.  He is already on aspirin before surgery.  We will be in touch about scheduling the surgery within the next month.  All questions and concerns were addressed and answered.

## 2023-08-05 ENCOUNTER — Other Ambulatory Visit: Payer: Self-pay

## 2023-08-05 ENCOUNTER — Encounter (HOSPITAL_COMMUNITY): Admission: RE | Admit: 2023-08-05 | Payer: Commercial Managed Care - PPO | Source: Ambulatory Visit

## 2023-08-07 ENCOUNTER — Telehealth: Payer: Self-pay | Admitting: Orthopaedic Surgery

## 2023-08-07 NOTE — Telephone Encounter (Signed)
[place] forms received. To [employer]. 

## 2023-08-08 ENCOUNTER — Other Ambulatory Visit: Payer: Self-pay

## 2023-08-13 NOTE — Progress Notes (Addendum)
COVID Vaccine received:  []  No [x]  Yes Date of any COVID positive Test in last 90 days:  PCP - Jacinta Shoe, MD  medical clearance in 05-06-23 Epic note Cardiologist -  Charlton Haws, MD  cardiac clearance 07-30-23  Epic note  Chest x-ray - 01-14-2017  2v  Epic EKG -  06-09-2024  Epic Stress Test - PET CT Perfusion study 07-17-2023  Eic ECHO - 06-03-2023  Epic Cardiac Cath -   PCR screen: [x]  Ordered & Completed []   No Order but Needs PROFEND     []   N/A for this surgery  Surgery Plan:  []  Ambulatory   [x]  Outpatient in bed  []  Admit Anesthesia:    []  General  [x]  Spinal  []   Choice []   MAC  Pacemaker / ICD device [x]  No []  Yes   Spinal Cord Stimulator:[x]  No []  Yes       History of Sleep Apnea? []  No [x]  Yes   CPAP used?- []  No [x]  Yes    Does the patient monitor blood sugar?   []  N/A   []  No [x]  Yes  Patient has: []  NO Hx DM   []  Pre-DM   []  DM1  [x]   DM2 Last A1c was:  9.3 on  08-22-2022     Does patient have a [employer] or Dexcom? []  No [x]  Yes   Fasting Blood Sugar Ranges-  Checks Blood Sugar _continuous times a day  SGLT-2 inhibitors / usual dose - FARXIGA   SGLT-2 instructions: hold x 72 hours  Blood Thinner / Instructions:  none   patient refuses Aspirin Instructions:  ASA 81 mg  no hold per [employer]  ERAS Protocol Ordered: []  No  [x]  Yes PRE-SURGERY []  ENSURE  [x]  G2   Patient is to be NPO after: 11:00  Dental hx: []  Dentures:  []  N/A      [x]  Bridge or Partial:  Partial denture upper                  []  Loose or Damaged teeth:   Comments: Patient was given the 5 CHG shower / bath instructions for TKA surgery along with 2 bottles of the CHG soap. Patient will start this on Monday 08-18-2023  All questions were asked and answered, Patient voiced understanding of this process.   Activity level: Patient is able to climb a flight of stairs without difficulty; [x]  No CP  [x]  No SOB, but would have Leg Pain.  Patient can perform ADLs without assistance.    Anesthesia review: HTN, Heart Murmur- AS, CAD, A.fib- patient refuses to take BT, Hx Hep B, OSA-CPAP  Patient denies shortness of breath, fever, cough and chest pain at PAT appointment.  Patient verbalized understanding and agreement to the Pre-Surgical Instructions that were given to them at this PAT appointment. Patient was also educated of the need to review these PAT instructions again prior to his surgery.I reviewed the appropriate phone numbers to call if they have any and questions or concerns.

## 2023-08-13 NOTE — Patient Instructions (Addendum)
SURGICAL WAITING ROOM VISITATION Patients having surgery or a procedure may have no more than 2 support people in the waiting area - these visitors may rotate in the visitor waiting room.   Due to an increase in RSV and influenza rates and associated hospitalizations, children ages 81 and under may not visit patients in [facility] hospitals. If the patient needs to stay at the hospital during part of their recovery, the visitor guidelines for inpatient rooms apply.  PRE-OP VISITATION  Pre-op nurse will coordinate an appropriate time for 1 support person to accompany the patient in pre-op.  This support person may not rotate.  This visitor will be contacted when the time is appropriate for the visitor to come back in the pre-op area.  Please refer to the [facility] website for the visitor guidelines for Inpatients (after your surgery is over and you are in a regular room).  You are not required to quarantine at this time prior to your surgery. However, you must do this: Hand Hygiene often Do NOT share personal items Notify your provider if you are in close contact with someone who has COVID or you develop fever 100.4 or greater, new onset of sneezing, cough, sore throat, shortness of breath or body aches.  If you test positive for Covid or have been in contact with anyone that has tested positive in the last 10 days please notify you surgeon.    Your procedure is scheduled on:  FRIDAY  August 22, 2023  Report to [facility] Main Entrance: [facility] entrance where the [employer] is available.   Report to admitting at: 11:30    AM  Call this number if you have any questions or problems the morning of surgery [phone]  Do not eat food after Midnight the night prior to your surgery/procedure.  After Midnight you may have the following liquids until 11:00  AM DAY OF SURGERY  Clear Liquid Diet Water Black Coffee (sugar ok, NO MILK/CREAM OR CREAMERS)  Tea (sugar ok, NO  MILK/CREAM OR CREAMERS) regular and decaf                             Plain Jell-O  with no fruit (NO RED)                                           Fruit ices (not with fruit pulp, NO RED)                                     Popsicles (NO RED)                                                                  Juice: NO CITRUS JUICES: only apple, WHITE grape, WHITE cranberry Sports drinks like Gatorade or Powerade (NO RED)                    The day of surgery:  Drink ONE (1) Pre-Surgery G2 at  11:00  AM the morning of surgery.  Drink in one sitting. Do not sip.  This drink was given to you during your hospital pre-op appointment visit. Nothing else to drink after completing the Pre-Surgery  G2 : No candy, chewing gum or throat lozenges.    FOLLOW ANY ADDITIONAL PRE OP INSTRUCTIONS YOU RECEIVED FROM YOUR SURGEON'S OFFICE!!!   Oral Hygiene is also important to reduce your risk of infection.        Remember - BRUSH YOUR TEETH THE MORNING OF SURGERY WITH YOUR REGULAR TOOTHPASTE  Do NOT smoke after Midnight the night before surgery.  STOP TAKING all Vitamins, Herbs and supplements 1 week before your surgery.   ASPIRIN-  ????  Take ONLY these medicines the morning of surgery with A SIP OF WATER: Amlodipine, gabapentin, You may take Tramadol and Diazepam if needed. You may use your Nebulizer and Albuterol Inhaler if needed. (Please bring the Albuterol inhaler with you on your surgery day)   If You have been diagnosed with Sleep Apnea - Bring CPAP mask and tubing day of surgery. We will provide you with a CPAP machine on the day of your surgery.                   You may not have any metal on your body including  jewelry, and body piercing  Do not wear lotions, powders, or deodorant  Men may shave face and neck.  Contacts, Hearing Aids, dentures or bridgework may not be worn into surgery. DENTURES WILL BE REMOVED PRIOR TO SURGERY PLEASE DO NOT APPLY "Poly grip" OR ADHESIVES!!!  You may  bring a small overnight bag with you on the day of surgery, only pack items that are not valuable. [place] IS NOT RESPONSIBLE   FOR VALUABLES THAT ARE LOST OR STOLEN.   Do not bring your home medications to the hospital. The Pharmacy will dispense medications listed on your medication list to you during your admission in the Hospital.  Special Instructions: Bring a copy of your healthcare power of attorney and living will documents the day of surgery, if you wish to have them scanned into your [facility] [facility] Records- EPIC  Please read over the following fact sheets you were given: IF YOU HAVE QUESTIONS ABOUT YOUR PRE-OP INSTRUCTIONS, PLEASE CALL [phone]    Rudean Haskell, BSN, CVRN-BC    Pre-operative 5 CHG Bath Instructions   You can play a key role in reducing the risk of infection after surgery. Your skin needs to be as free of germs as possible. You can reduce the number of germs on your skin by washing with CHG (chlorhexidine gluconate) soap before surgery. CHG is an antiseptic soap that kills germs and continues to kill germs even after washing.   DO NOT use if you have an allergy to chlorhexidine/CHG or antibacterial soaps. If your skin becomes reddened or irritated, stop using the CHG and notify one of our RNs at [phone]  Please shower with the CHG soap starting 4 days before surgery using the following schedule: START SHOWERS ON   MONDAY August 18, 2023  Please keep in mind the following:  DO NOT shave, including legs and underarms, starting the day of your first shower.   You may shave your face at any point before/day of surgery.   Place clean sheets on your bed the day you start using CHG soap. Use a clean washcloth (not used since being washed) for each shower. DO NOT sleep with pets once  you start using the CHG.   CHG Shower Instructions:  If you choose to wash your hair and private area, wash first with your normal shampoo/soap.  After you use shampoo/soap, rinse your hair and body thoroughly to remove shampoo/soap residue.  Turn the water OFF and apply about 3 tablespoons (45 ml) of CHG soap to a CLEAN washcloth.  Apply CHG soap ONLY FROM YOUR NECK DOWN TO YOUR TOES (washing for 3-5 minutes)  DO NOT use CHG soap on face, private areas, open wounds, or sores.  Pay special attention to the area where your surgery is being performed.  If you are having back surgery, having someone wash your back for you may be helpful.  Wait 2 minutes after CHG soap is applied, then you may rinse off the CHG soap.  Pat dry with a clean towel  Put on clean clothes/pajamas   If you choose to wear lotion, please use ONLY the CHG-compatible lotions on the back of this paper.     Additional instructions for the day of surgery: DO NOT APPLY any lotions, deodorants, cologne, or perfumes.   Put on clean/comfortable clothes.  Brush your teeth.  Ask your nurse before applying any prescription medications to the skin.      CHG Compatible Lotions   Aveeno Moisturizing lotion  Cetaphil Moisturizing Cream  Cetaphil Moisturizing Lotion  Clairol Herbal Essence Moisturizing Lotion, Dry Skin  Clairol Herbal Essence Moisturizing Lotion, Extra Dry Skin  Clairol Herbal Essence Moisturizing Lotion, Normal Skin  Curel Age Defying Therapeutic Moisturizing Lotion with Alpha Hydroxy  Curel Extreme Care Body Lotion  Curel Soothing Hands Moisturizing Hand Lotion  Curel Therapeutic Moisturizing Cream, Fragrance-Free  Curel Therapeutic Moisturizing Lotion, Fragrance-Free  Curel Therapeutic Moisturizing Lotion, Original Formula  Eucerin Daily Replenishing Lotion  Eucerin Dry Skin Therapy Plus Alpha Hydroxy Crme  Eucerin Dry Skin Therapy Plus Alpha Hydroxy Lotion  Eucerin Original Crme  Eucerin Original  Lotion  Eucerin Plus Crme Eucerin Plus Lotion  Eucerin TriLipid Replenishing Lotion  Keri Anti-Bacterial Hand Lotion  Keri Deep Conditioning Original Lotion Dry Skin Formula Softly Scented  Keri Deep Conditioning Original Lotion, Fragrance Free Sensitive Skin Formula  Keri Lotion Fast Absorbing Fragrance Free Sensitive Skin Formula  Keri Lotion Fast Absorbing Softly Scented Dry Skin Formula  Keri Original Lotion  Keri Skin Renewal Lotion Keri Silky Smooth Lotion  Keri Silky Smooth Sensitive Skin Lotion  Nivea Body Creamy Conditioning Oil  Nivea Body Extra Enriched Lotion  Nivea Body Original Lotion  Nivea Body Sheer Moisturizing Lotion Nivea Crme  Nivea Skin Firming Lotion  NutraDerm 30 Skin Lotion  NutraDerm Skin Lotion  NutraDerm Therapeutic Skin Cream  NutraDerm Therapeutic Skin Lotion  ProShield Protective Hand Cream  Provon moisturizing lotion   FAILURE TO FOLLOW THESE INSTRUCTIONS MAY RESULT IN THE CANCELLATION OF YOUR SURGERY  PATIENT SIGNATURE_________________________________  NURSE SIGNATURE__________________________________  ________________________________________________________________________      Rogelia Mire    An incentive spirometer is a tool that can help keep your lungs clear and active. This tool measures how well you are filling your lungs with each breath. Taking long  deep breaths may help reverse or decrease the chance of developing breathing (pulmonary) problems (especially infection) following: A long period of time when you are unable to move or be active. BEFORE THE PROCEDURE  If the spirometer includes an indicator to show your best effort, your nurse or respiratory therapist will set it to a desired goal. If possible, sit up straight or lean slightly forward. Try not to slouch. Hold the incentive spirometer in an upright position. INSTRUCTIONS FOR USE  Sit on the edge of your bed if possible, or sit up as far as you can in bed or  on a chair. Hold the incentive spirometer in an upright position. Breathe out normally. Place the mouthpiece in your mouth and seal your lips tightly around it. Breathe in slowly and as deeply as possible, raising the piston or the ball toward the top of the column. Hold your breath for 3-5 seconds or for as long as possible. Allow the piston or ball to fall to the bottom of the column. Remove the mouthpiece from your mouth and breathe out normally. Rest for a few seconds and repeat Steps 1 through 7 at least 10 times every 1-2 hours when you are awake. Take your time and take a few normal breaths between deep breaths. The spirometer may include an indicator to show your best effort. Use the indicator as a goal to work toward during each repetition. After each set of 10 deep breaths, practice coughing to be sure your lungs are clear. If you have an incision (the cut made at the time of surgery), support your incision when coughing by placing a pillow or rolled up towels firmly against it. Once you are able to get out of bed, walk around indoors and cough well. You may stop using the incentive spirometer when instructed by your caregiver.  RISKS AND COMPLICATIONS Take your time so you do not get dizzy or light-headed. If you are in pain, you may need to take or ask for pain medication before doing incentive spirometry. It is harder to take a deep breath if you are having pain. AFTER USE Rest and breathe slowly and easily. It can be helpful to keep track of a log of your progress. Your caregiver can provide you with a simple table to help with this. If you are using the spirometer at home, follow these instructions: SEEK MEDICAL CARE IF:  You are having difficultly using the spirometer. You have trouble using the spirometer as often as instructed. Your pain medication is not giving enough relief while using the spirometer. You develop fever of 100.5 F (38.1 C) or higher.                                                                                                     SEEK IMMEDIATE MEDICAL CARE IF:  You cough up bloody sputum that had not been present before. You develop fever of 102 F (38.9 C) or greater. You develop worsening pain at or near the incision site. MAKE SURE YOU:  Understand these instructions. Will watch your condition. Will  get help right away if you are not doing well or get worse. Document Released: 11/11/2006 Document Revised: 09/23/2011 Document Reviewed: 01/12/2007 [facility] Patient Information 2014 [place], [place].             If you would like to see a video about joint replacement:   [email or web address]

## 2023-08-14 ENCOUNTER — Other Ambulatory Visit (HOSPITAL_COMMUNITY): Payer: Self-pay

## 2023-08-14 ENCOUNTER — Encounter (HOSPITAL_COMMUNITY)
Admission: RE | Admit: 2023-08-14 | Discharge: 2023-08-14 | Disposition: A | Discharge status: Home / Self Care | Payer: Commercial Managed Care - PPO | Source: Ambulatory Visit | Attending: Orthopaedic Surgery | Admitting: Orthopaedic Surgery

## 2023-08-14 ENCOUNTER — Encounter (HOSPITAL_COMMUNITY): Payer: Self-pay

## 2023-08-14 ENCOUNTER — Other Ambulatory Visit: Payer: Self-pay

## 2023-08-14 VITALS — BP 172/70 | HR 54 | Temp 98.9°F | Resp 20 | Ht 71.0 in | Wt 284.0 lb

## 2023-08-14 DIAGNOSIS — I4891 Unspecified atrial fibrillation: Secondary | ICD-10-CM | POA: Insufficient documentation

## 2023-08-14 DIAGNOSIS — E1140 Type 2 diabetes mellitus with diabetic neuropathy, unspecified: Secondary | ICD-10-CM | POA: Diagnosis not present

## 2023-08-14 DIAGNOSIS — Z87891 Personal history of nicotine dependence: Secondary | ICD-10-CM | POA: Insufficient documentation

## 2023-08-14 DIAGNOSIS — I2510 Atherosclerotic heart disease of native coronary artery without angina pectoris: Secondary | ICD-10-CM | POA: Diagnosis not present

## 2023-08-14 DIAGNOSIS — Z794 Long term (current) use of insulin: Secondary | ICD-10-CM | POA: Diagnosis not present

## 2023-08-14 DIAGNOSIS — I358 Other nonrheumatic aortic valve disorders: Secondary | ICD-10-CM | POA: Diagnosis not present

## 2023-08-14 DIAGNOSIS — J45909 Unspecified asthma, uncomplicated: Secondary | ICD-10-CM | POA: Diagnosis not present

## 2023-08-14 DIAGNOSIS — I10 Essential (primary) hypertension: Secondary | ICD-10-CM | POA: Insufficient documentation

## 2023-08-14 DIAGNOSIS — M1711 Unilateral primary osteoarthritis, right knee: Secondary | ICD-10-CM | POA: Diagnosis not present

## 2023-08-14 DIAGNOSIS — Z01818 Encounter for other preprocedural examination: Secondary | ICD-10-CM | POA: Insufficient documentation

## 2023-08-14 HISTORY — DX: Anxiety disorder, unspecified: F41.9

## 2023-08-14 HISTORY — DX: Atherosclerotic heart disease of native coronary artery without angina pectoris: I25.10

## 2023-08-14 HISTORY — DX: Myoneural disorder, unspecified: G70.9

## 2023-08-14 HISTORY — DX: Pneumonia, unspecified organism: J18.9

## 2023-08-14 LAB — BASIC METABOLIC PANEL
Anion gap: 10 (ref 5–15)
BUN: 31 mg/dL — ABNORMAL HIGH (ref 8–23)
CO2: 28 mmol/L (ref 22–32)
Calcium: 9.6 mg/dL (ref 8.9–10.3)
Chloride: 99 mmol/L (ref 98–111)
Creatinine, Ser: 1.19 mg/dL (ref 0.61–1.24)
GFR, Estimated: >60 mL/min (ref >60)
Glucose, Bld: 134 mg/dL — ABNORMAL HIGH (ref 70–99)
Potassium: 3.5 mmol/L (ref 3.5–5.1)
Sodium: 137 mmol/L (ref 135–145)

## 2023-08-14 LAB — GLUCOSE, CAPILLARY: Glucose-Capillary: 135 mg/dL — ABNORMAL HIGH (ref 70–99)

## 2023-08-14 LAB — CBC
HCT: 41.9 % (ref 39.0–52.0)
Hemoglobin: 13.9 g/dL (ref 13.0–17.0)
MCH: 31.1 pg (ref 26.0–34.0)
MCHC: 33.2 g/dL (ref 30.0–36.0)
MCV: 93.7 fL (ref 80.0–100.0)
Platelets: 188 10*3/uL (ref 150–400)
RBC: 4.47 MIL/uL (ref 4.22–5.81)
RDW: 12.5 % (ref 11.5–15.5)
WBC: 5.7 10*3/uL (ref 4.0–10.5)
nRBC: 0 % (ref 0.0–0.2)

## 2023-08-14 LAB — SURGICAL PCR SCREEN
MRSA, PCR: NEGATIVE
Staphylococcus aureus: NEGATIVE

## 2023-08-15 LAB — HEMOGLOBIN A1C
Hgb A1c MFr Bld: 6.7 % — ABNORMAL HIGH (ref 4.8–5.6)
Mean Plasma Glucose: 145.59 mg/dL

## 2023-08-16 ENCOUNTER — Other Ambulatory Visit (HOSPITAL_COMMUNITY): Payer: Self-pay

## 2023-08-18 ENCOUNTER — Ambulatory Visit (HOSPITAL_COMMUNITY): Admit: 2023-08-18 | Payer: Commercial Managed Care - PPO | Admitting: Orthopedic Surgery

## 2023-08-18 SURGERY — ARTHROPLASTY, KNEE, TOTAL
Anesthesia: Choice | Site: Knee | Laterality: Right

## 2023-08-18 NOTE — Anesthesia Preprocedure Evaluation (Signed)
Anesthesia Evaluation    Airway        Dental   Pulmonary former smoker          Cardiovascular hypertension,      Neuro/Psych    GI/Hepatic   Endo/Other  diabetes    Renal/GU      Musculoskeletal   Abdominal   Peds  Hematology   Anesthesia Other Findings   Reproductive/Obstetrics                             Anesthesia Physical Anesthesia Plan  ASA:   Anesthesia Plan:    Post-op Pain Management:    Induction:   PONV Risk Score and Plan:   Airway Management Planned:   Additional Equipment:   Intra-op Plan:   Post-operative Plan:   Informed Consent:   Plan Discussed with:   Anesthesia Plan Comments: (See PAT note 08/14/2023)       Anesthesia Quick Evaluation

## 2023-08-18 NOTE — Progress Notes (Signed)
Anesthesia Chart Review   Case: [id number] Date/Time: 08/22/23 1345   Procedure: RIGHT TOTAL KNEE ARTHROPLASTY (Right: Knee)   Anesthesia type: Spinal   Pre-op diagnosis: osteoarthritis right knee   Location: [facility] ROOM 10 / [facility]   Surgeons: Kathryne Hitch, MD       DISCUSSION:73 y.o. former smoker with h/o HTN, asthma, sleep apnea, DM II (A1C 6.7), atrial fibrillation, CAD, right knee OA scheduled for above procedure 08/22/2023 with Dr. Doneen Poisson.   Low risk stress test 07/17/23.  Pt seen by cardiology 07/30/2023 for preoperative evaluation.  Per OV note, "Preoperative:  moderate risk orthopedic surgery. Poorly controlled diabetic last A1c 9.3 with known very high coronary calcium score of  1847, 97 th percentile with at least moderate LAD/RCA stenosis PET/CT non ischemic with no TID, normal MBFR and stress EF 57%. If he has a lot of pain or anemia post op will likely need some afib rate control "  VS: BP (!) 172/70 Comment: right arm sitting  Pulse (!) 54   Temp 37.2 C (Oral)   Resp 20   Ht 5\' 11"  (1.803 m)   Wt 128.8 kg   SpO2 95%   BMI 39.61 kg/m   PROVIDERS: Plotnikov, Georgina Quint, MD is PCP   Cardiologist -  Charlton Haws, MD  LABS: Labs reviewed: Acceptable for surgery. (all labs ordered are listed, but only abnormal results are displayed)  Labs Reviewed  BASIC METABOLIC PANEL - Abnormal; Notable for the following components:      Result Value   Glucose, Bld 134 (*)    BUN 31 (*)    All other components within normal limits  HEMOGLOBIN A1C - Abnormal; Notable for the following components:   Hgb A1c MFr Bld 6.7 (*)    All other components within normal limits  GLUCOSE, CAPILLARY - Abnormal; Notable for the following components:   Glucose-Capillary 135 (*)    All other components within normal limits  SURGICAL PCR SCREEN  CBC     IMAGES:   EKG:   CV: CT Cardiac Perfusion 07/17/23   The study is normal. The study is low risk.   LV perfusion is  normal. There is no evidence of ischemia. There is no evidence of infarction.   ECG rhythm shows atrial fibrillation.   No ST deviation was noted. T-wave inversion was noted with pharmacologic stress. T wave inversion began at 1 minutes of stress. T wave inversion returned to baseline after less than 1 min of recovery. Arrhythmias during recovery: occasional PVCs. The ECG was not diagnostic due to pharmacologic protocol.   Rest left ventricular function is normal. Rest EF: 51%. Stress left ventricular function is normal. Stress EF: 57%. End diastolic cavity size is moderately enlarged. End systolic cavity size is mildly enlarged. These may be subject to gating error in atrial fibrillation with PVCs. No evidence of transient ischemic dilation (TID) noted.   Myocardial blood flow was computed to be 0.17ml/g/min at rest and 1.77ml/g/min at stress. Global myocardial blood flow reserve was 2.13 and was normal.   Coronary calcium was present on the attenuation correction CT images. Severe coronary calcifications were present. Coronary calcifications were present in the left anterior descending artery, left circumflex artery and right coronary artery distribution(s). Moderate aortic valve calcifications.   Electronically signed by: Parke Poisson, MD  Echo 06/03/2023 1. Left ventricular ejection fraction, by estimation, is 60 to 65%. Left  ventricular ejection fraction by 3D volume is 67 %. The left ventricle  has  normal function. The left ventricle has no regional wall motion  abnormalities. Left ventricular diastolic   function could not be evaluated.   2. Right ventricular systolic function is mildly reduced. The right  ventricular size is mildly enlarged. There is mildly elevated pulmonary  artery systolic pressure. The estimated right ventricular systolic  pressure is 45.5 mmHg.   3. Left atrial size was severely dilated.   4. Right atrial size was severely dilated.   5. The mitral valve is  normal in structure. No evidence of mitral valve  regurgitation. No evidence of mitral stenosis.   6. The aortic valve was not well visualized. There is mild calcification  of the aortic valve. There is mild thickening of the aortic valve. Aortic  valve regurgitation is trivial. Aortic valve sclerosis/calcification is  present, without any evidence of  aortic stenosis.   7. The inferior vena cava is dilated in size with >50% respiratory  variability, suggesting right atrial pressure of 8 mmHg.   Comparison(s): Prior images unable to be directly viewed, comparison made  by report only.   Past Medical History:  Diagnosis Date   Allergy    SEASONAL   Anxiety    Arthritis    Asthma    as a child   BPH (benign prostatic hyperplasia)    Cancer (HCC)    prostatectomy 2018 Gleason 7   Cataract    BILATERAL   Coronary artery disease    Diabetes mellitus    average fasting 140s type 2   DJD (degenerative joint disease)    Heart murmur    dr. Gala Romney   Hepatitis    hep B   Hyperlipidemia    Hypertension    Neuromuscular disorder (HCC)    sciatica LLE   Obesity    Pneumonia    Sleep apnea    C-PAP    Past Surgical History:  Procedure Laterality Date   Biceps tendon reinsertion     colonoscopy with polypectomy     adenomatous; Dr Leone Payor   JOINT REPLACEMENT Left    knee   left knee arthroscopy     LYMPHADENECTOMY Bilateral 01/23/2017   Procedure: BILATERAL LYMPHADENECTOMY;  Surgeon: Heloise Purpura, MD;  Location: [facility];  Service: Urology;  Laterality: Bilateral;   PROSTATECTOMY     01/23/17 Dr. Laverle Patter   ROBOT ASSISTED LAPAROSCOPIC RADICAL PROSTATECTOMY N/A 01/23/2017   Procedure: XI ROBOTIC ASSISTED LAPAROSCOPIC RADICAL PROSTATECTOMY LEVEL 3;  Surgeon: Heloise Purpura, MD;  Location: [facility];  Service: Urology;  Laterality: N/A;   TONSILLECTOMY AND ADENOIDECTOMY     TOTAL KNEE ARTHROPLASTY Left 01/11/2013   Procedure: LEFT TOTAL KNEE ARTHROPLASTY;  Surgeon: Loanne Drilling, MD;  Location[facility];  Service: Orthopedics;  Laterality: Left;   TOTAL SHOULDER ARTHROPLASTY Left 08/12/2014   Procedure: LEFT TOTAL SHOULDER ARTHROPLASTY;  Surgeon: Verlee Rossetti, MD;  Location: [facility];  Service: Orthopedics;  Laterality: Left;   UMBILICAL HERNIA REPAIR      MEDICATIONS:  albuterol (VENTOLIN HFA) 108 (90 Base) MCG/ACT inhaler   amLODipine (NORVASC) 5 MG tablet   aspirin EC 81 MG tablet   chlorhexidine gluconate, MEDLINE KIT, (PERIDEX) 0.12 % solution   Cholecalciferol (VITAMIN D3) 50 MCG (2000 UT) capsule   Continuous Glucose Sensor (DEXCOM G7 SENSOR) MISC   dapagliflozin propanediol (FARXIGA) 10 MG TABS tablet   diazepam (VALIUM) 5 MG tablet   fluticasone-salmeterol (ADVAIR DISKUS) 500-50 MCG/ACT AEPB   gabapentin (NEURONTIN) 300 MG capsule   glucose  blood (FREESTYLE LITE) test strip   glucose monitoring kit (FREESTYLE) monitoring kit   hydrochlorothiazide (HYDRODIURIL) 25 MG tablet   icosapent Ethyl (VASCEPA) 1 g capsule   insulin glargine-yfgn (SEMGLEE, YFGN,) 100 UNIT/ML Pen   Insulin Pen Needle (TECHLITE PEN NEEDLES) 31G X 5 MM MISC   Lancets (FREESTYLE) lancets   Lancets Misc. (ACCU-CHEK FASTCLIX LANCET) KIT   levalbuterol (XOPENEX) 0.63 MG/3ML nebulizer solution   losartan (COZAAR) 100 MG tablet   psyllium (METAMUCIL) 58.6 % packet   Sodium Fluoride 1.1 % PSTE   tadalafil (CIALIS) 20 MG tablet   traMADol (ULTRAM) 50 MG tablet    0.9 %  sodium chloride infusion      Samnang Shugars Ward, PA-C [facility] [phone]

## 2023-08-19 ENCOUNTER — Encounter: Payer: Self-pay | Admitting: Orthopaedic Surgery

## 2023-08-19 ENCOUNTER — Other Ambulatory Visit (HOSPITAL_COMMUNITY): Payer: Self-pay

## 2023-08-21 NOTE — H&P (Signed)
 TOTAL KNEE ADMISSION H&P  Patient is being admitted for right total knee arthroplasty.  Subjective:  Chief Complaint:right knee pain.  HPI: Rodney Hayes, 73 y.o. male, has a history of pain and functional disability in the right knee due to arthritis and has failed non-surgical conservative treatments for greater than 12 weeks to includeNSAID's and/or analgesics, corticosteriod injections, supervised PT with diminished ADL's post treatment, weight reduction as appropriate, and activity modification.  Onset of symptoms was gradual, starting 1 years ago with gradually worsening course since that time. The patient noted no past surgery on the right knee(s).  Patient currently rates pain in the right knee(s) at 10 out of 10 with activity. Patient has night pain, worsening of pain with activity and weight bearing, pain that interferes with activities of daily living, pain with passive range of motion, crepitus, and joint swelling.  Patient has evidence of subchondral sclerosis, periarticular osteophytes, and joint space narrowing by imaging studies. There is no active infection.  Patient Active Problem List   Diagnosis Date Noted   Situational anxiety 08/27/2022   Post-COVID syndrome 08/13/2021   Stress at home 08/13/2021   COVID-19 07/31/2021   Diabetic neuropathy (HCC) 06/27/2021   Biochemically recurrent malignant neoplasm of prostate (HCC) 04/06/2019   Dyslipidemia 08/25/2018   Well adult exam 08/25/2018   Actinic keratoses 08/25/2018   Prostate cancer (HCC) 01/23/2017   Diabetes mellitus (HCC) 10/04/2016   Aortic stenosis 08/19/2016   Coronary artery disease 08/04/2015   Obesity 08/04/2015   Osteoarthritis of multiple joints 08/04/2015   S/P TKR (total knee replacement) 08/04/2015   S/P shoulder replacement 08/12/2014   Arthritis of shoulder region, left, degenerative 03/22/2014   Unilateral primary osteoarthritis, right knee 01/11/2013   History of colonic polyps 05/30/2010    Obstructive sleep apnea 05/10/2009   Essential hypertension 05/10/2009   History of hepatitis B virus infection 05/10/2009   Past Medical History:  Diagnosis Date   Allergy    SEASONAL   Anxiety    Arthritis    Asthma    as a child   BPH (benign prostatic hyperplasia)    Cancer (HCC)    prostatectomy 2018 Gleason 7   Cataract    BILATERAL   Coronary artery disease    Diabetes mellitus    average fasting 140s type 2   DJD (degenerative joint disease)    Heart murmur    dr. cherrie   Hepatitis    hep B   Hyperlipidemia    Hypertension    Neuromuscular disorder (HCC)    sciatica LLE   Obesity    Pneumonia    Sleep apnea    C-PAP    Past Surgical History:  Procedure Laterality Date   Biceps tendon reinsertion     colonoscopy with polypectomy     adenomatous; Dr Avram   JOINT REPLACEMENT Left    knee   left knee arthroscopy     LYMPHADENECTOMY Bilateral 01/23/2017   Procedure: BILATERAL LYMPHADENECTOMY;  Surgeon: Renda Glance, MD;  Location: [facility];  Service: Urology;  Laterality: Bilateral;   PROSTATECTOMY     01/23/17 Dr. Renda   ROBOT ASSISTED LAPAROSCOPIC RADICAL PROSTATECTOMY N/A 01/23/2017   Procedure: XI ROBOTIC ASSISTED LAPAROSCOPIC RADICAL PROSTATECTOMY LEVEL 3;  Surgeon: Renda Glance, MD;  Location: [facility];  Service: Urology;  Laterality: N/A;   TONSILLECTOMY AND ADENOIDECTOMY     TOTAL KNEE ARTHROPLASTY Left 01/11/2013   Procedure: LEFT TOTAL KNEE ARTHROPLASTY;  Surgeon: Dempsey LULLA Moan, MD;  Location:  [facility];  Service: Orthopedics;  Laterality: Left;   TOTAL SHOULDER ARTHROPLASTY Left 08/12/2014   Procedure: LEFT TOTAL SHOULDER ARTHROPLASTY;  Surgeon: Elspeth JONELLE Her, MD;  Location: [facility];  Service: Orthopedics;  Laterality: Left;   UMBILICAL HERNIA REPAIR      Current Facility-Administered Medications  Medication Dose Route Frequency Provider Last Rate Last Admin   0.9 %  sodium chloride  infusion  500 mL Intravenous Once Avram Lupita BRAVO, MD        Current Outpatient Medications  Medication Sig Dispense Refill Last Dose/Taking   albuterol  (VENTOLIN  HFA) 108 (90 Base) MCG/ACT inhaler Inhale 2 puffs into the lungs every 6 (six) hours as needed for wheezing or shortness of breath. 8.5 g 2 Taking As Needed   amLODipine  (NORVASC ) 5 MG tablet Take 1 tablet (5 mg total) by mouth daily. 90 tablet 3 Taking   aspirin  EC 81 MG tablet Take 162 mg by mouth daily.   Taking   Cholecalciferol (VITAMIN D3) 50 MCG (2000 UT) capsule Take 1 capsule (2,000 Units total) by mouth daily. 100 capsule 3 Taking   dapagliflozin  propanediol (FARXIGA ) 10 MG TABS tablet Take 1 tablet (10 mg total) by mouth daily before breakfast. 30 tablet 11 Taking   diazepam  (VALIUM ) 5 MG tablet Take 1-2 tablets by mouth as needed for procedure anxiety or insomnia. 60 tablet 1 Taking As Needed   gabapentin  (NEURONTIN ) 300 MG capsule Take 1 capsule (300 mg total) by mouth 2 (two) times daily. 180 capsule 3 Taking   hydrochlorothiazide  (HYDRODIURIL ) 25 MG tablet Take 1 tablet (25 mg total) by mouth daily. 90 tablet 2 Taking   icosapent  Ethyl (VASCEPA ) 1 g capsule Take 2 capsules (2 g total) by mouth 2 (two) times daily. 360 capsule 2 Taking   levalbuterol  (XOPENEX ) 0.63 MG/3ML nebulizer solution Take 3 mLs (0.63 mg total) by nebulization every 4 (four) hours as needed for wheezing or shortness of breath. 120 mL 5 Taking As Needed   losartan  (COZAAR ) 100 MG tablet Take 1 tablet (100 mg total) by mouth daily 30 tablet 11 Taking   psyllium (METAMUCIL) 58.6 % packet Take 1 packet by mouth daily.   Taking   chlorhexidine  gluconate, MEDLINE KIT, (PERIDEX ) 0.12 % solution swish and spit, as directed on bottle. (Patient not taking: Reported on 08/08/2023) 473 mL 2 Not Taking   Continuous Glucose Sensor (DEXCOM G7 SENSOR) MISC Replace every 10 days 3 each 11    fluticasone -salmeterol (ADVAIR  DISKUS) 500-50 MCG/ACT AEPB Inhale 1 puff into the lungs 2 (two) times daily. (Patient not taking: Reported  on 08/08/2023) 60 each 2 Not Taking   glucose blood (FREESTYLE LITE) test strip Use as directed four times daily 200 each 0    glucose monitoring kit (FREESTYLE) monitoring kit 1 each by Does not apply route as needed for other. 1 each 1    insulin  glargine-yfgn (SEMGLEE , YFGN,) 100 UNIT/ML Pen Inject 20 Units into the skin 2 (two) times daily. (Patient not taking: Reported on 08/08/2023) 15 mL 2 Not Taking   Insulin  Pen Needle (TECHLITE PEN NEEDLES) 31G X 5 MM MISC Use to administer insulin  2 (two) times daily. 100 each 3    Lancets (FREESTYLE) lancets Use as instructed qid 200 each 12    Lancets Misc. (ACCU-CHEK FASTCLIX LANCET) KIT Use as directed 1 kit 5    Sodium Fluoride  1.1 % PSTE Use as directed on package (Patient not taking: Reported on 08/08/2023) 200 mL 11 Not Taking   tadalafil  (CIALIS )  20 MG tablet Take 1 tablet (20 mg total) by mouth every evening if needed 30 tablet 11    traMADol  (ULTRAM ) 50 MG tablet Take 1 tablet (50 mg total) by mouth 3 (three) to 4 (four) times daily. (Patient taking differently: Take 50 mg by mouth 4 (four) times daily as needed for moderate pain (pain score 4-6).) 120 tablet 3    No Known Allergies  Social History   Tobacco Use   Smoking status: Former    Current packs/day: 0.00    Average packs/day: 2.0 packs/day for 30.1 years (60.2 ttl pk-yrs)    Types: Cigarettes    Start date: 07/15/1962    Quit date: 08/15/1992    Years since quitting: 31.0   Smokeless tobacco: Never   Tobacco comments:    smoked 14 -42 up to 2 ppd  Substance Use Topics   Alcohol use: Yes    Alcohol/week: 4.0 standard drinks of alcohol    Types: 4 Glasses of wine per week    Comment: Red Wine    Family History  Problem Relation Age of Onset   Colon cancer Mother    Lung cancer Mother        smoker/lung ca and colon?   Alcohol abuse Mother    Diabetes Mother    Diabetes Father    Coronary artery disease Father        CBAG in late 41s   Cancer Father        prostate    Heart attack Father    Stroke Neg Hx    Colon polyps Neg Hx    Crohn's disease Neg Hx    Esophageal cancer Neg Hx    Rectal cancer Neg Hx    Stomach cancer Neg Hx    Ulcerative colitis Neg Hx      Review of Systems  Objective:  Physical Exam Vitals reviewed.  Constitutional:      Appearance: Normal appearance. He is obese.  HENT:     Head: Normocephalic and atraumatic.  Eyes:     Extraocular Movements: Extraocular movements intact.     Pupils: Pupils are equal, round, and reactive to light.  Cardiovascular:     Rate and Rhythm: Normal rate.  Pulmonary:     Effort: Pulmonary effort is normal.     Breath sounds: Normal breath sounds.  Abdominal:     Palpations: Abdomen is soft.  Musculoskeletal:     Cervical back: Normal range of motion and neck supple.     Right knee: Deformity, effusion, bony tenderness and crepitus present. Decreased range of motion. Tenderness present over the medial joint line, lateral joint line and patellar tendon. Abnormal alignment.  Neurological:     Mental Status: He is alert and oriented to person, place, and time.  Psychiatric:        Behavior: Behavior normal.     Vital signs in last 24 hours:    Labs:   Estimated body mass index is 39.61 kg/m as calculated from the following:   Height as of 08/14/23: 5' 11 (1.803 m).   Weight as of 08/14/23: 128.8 kg.   Imaging Review Plain radiographs demonstrate severe degenerative joint disease of the right knee(s). The overall alignment ismild valgus. The bone quality appears to be good for age and reported activity level.      Assessment/Plan:  End stage arthritis, right knee   The patient history, physical examination, clinical judgment of the provider and imaging studies are consistent with end  stage degenerative joint disease of the right knee(s) and total knee arthroplasty is deemed medically necessary. The treatment options including medical management, injection therapy arthroscopy  and arthroplasty were discussed at length. The risks and benefits of total knee arthroplasty were presented and reviewed. The risks due to aseptic loosening, infection, stiffness, patella tracking problems, thromboembolic complications and other imponderables were discussed. The patient acknowledged the explanation, agreed to proceed with the plan and consent was signed. Patient is being admitted for inpatient treatment for surgery, pain control, PT, OT, prophylactic antibiotics, VTE prophylaxis, progressive ambulation and ADL's and discharge planning. The patient is planning to be discharged home with home health services

## 2023-08-22 ENCOUNTER — Observation Stay (HOSPITAL_COMMUNITY)
Admission: RE | Admit: 2023-08-22 | Discharge: 2023-08-23 | Disposition: A | Discharge status: Home Health | Payer: Commercial Managed Care - PPO | Attending: Orthopaedic Surgery | Admitting: Orthopaedic Surgery

## 2023-08-22 ENCOUNTER — Encounter (HOSPITAL_COMMUNITY): Payer: Self-pay | Admitting: Orthopaedic Surgery

## 2023-08-22 ENCOUNTER — Encounter (HOSPITAL_COMMUNITY)
Admission: RE | Disposition: A | Discharge status: Home Health | Payer: Self-pay | Source: Home / Self Care | Attending: Orthopaedic Surgery

## 2023-08-22 ENCOUNTER — Ambulatory Visit (HOSPITAL_COMMUNITY): Payer: Commercial Managed Care - PPO | Admitting: Physician Assistant

## 2023-08-22 ENCOUNTER — Other Ambulatory Visit: Payer: Self-pay

## 2023-08-22 ENCOUNTER — Observation Stay (HOSPITAL_COMMUNITY): Payer: Commercial Managed Care - PPO

## 2023-08-22 ENCOUNTER — Ambulatory Visit (HOSPITAL_COMMUNITY): Payer: Commercial Managed Care - PPO | Admitting: Anesthesiology

## 2023-08-22 DIAGNOSIS — E119 Type 2 diabetes mellitus without complications: Secondary | ICD-10-CM | POA: Diagnosis not present

## 2023-08-22 DIAGNOSIS — Z96651 Presence of right artificial knee joint: Secondary | ICD-10-CM | POA: Diagnosis not present

## 2023-08-22 DIAGNOSIS — Z8546 Personal history of malignant neoplasm of prostate: Secondary | ICD-10-CM | POA: Insufficient documentation

## 2023-08-22 DIAGNOSIS — Z87891 Personal history of nicotine dependence: Secondary | ICD-10-CM | POA: Insufficient documentation

## 2023-08-22 DIAGNOSIS — Z7982 Long term (current) use of aspirin: Secondary | ICD-10-CM | POA: Diagnosis not present

## 2023-08-22 DIAGNOSIS — J45909 Unspecified asthma, uncomplicated: Secondary | ICD-10-CM | POA: Diagnosis not present

## 2023-08-22 DIAGNOSIS — M1712 Unilateral primary osteoarthritis, left knee: Principal | ICD-10-CM | POA: Insufficient documentation

## 2023-08-22 DIAGNOSIS — Z01818 Encounter for other preprocedural examination: Secondary | ICD-10-CM

## 2023-08-22 DIAGNOSIS — I10 Essential (primary) hypertension: Secondary | ICD-10-CM | POA: Insufficient documentation

## 2023-08-22 DIAGNOSIS — Z96652 Presence of left artificial knee joint: Secondary | ICD-10-CM | POA: Diagnosis not present

## 2023-08-22 DIAGNOSIS — Z79899 Other long term (current) drug therapy: Secondary | ICD-10-CM | POA: Diagnosis not present

## 2023-08-22 DIAGNOSIS — R609 Edema, unspecified: Secondary | ICD-10-CM | POA: Diagnosis not present

## 2023-08-22 DIAGNOSIS — Z794 Long term (current) use of insulin: Secondary | ICD-10-CM | POA: Diagnosis not present

## 2023-08-22 DIAGNOSIS — M1711 Unilateral primary osteoarthritis, right knee: Secondary | ICD-10-CM

## 2023-08-22 DIAGNOSIS — I2510 Atherosclerotic heart disease of native coronary artery without angina pectoris: Secondary | ICD-10-CM | POA: Diagnosis not present

## 2023-08-22 DIAGNOSIS — Z96612 Presence of left artificial shoulder joint: Secondary | ICD-10-CM | POA: Diagnosis not present

## 2023-08-22 DIAGNOSIS — E1140 Type 2 diabetes mellitus with diabetic neuropathy, unspecified: Secondary | ICD-10-CM

## 2023-08-22 DIAGNOSIS — Z8616 Personal history of COVID-19: Secondary | ICD-10-CM | POA: Insufficient documentation

## 2023-08-22 HISTORY — PX: TOTAL KNEE ARTHROPLASTY: SHX125

## 2023-08-22 LAB — GLUCOSE, CAPILLARY
Glucose-Capillary: 132 mg/dL — ABNORMAL HIGH (ref 70–99)
Glucose-Capillary: 117 mg/dL — ABNORMAL HIGH (ref 70–99)

## 2023-08-22 SURGERY — ARTHROPLASTY, KNEE, TOTAL
Anesthesia: Spinal | Site: Knee | Laterality: Right

## 2023-08-22 MED ORDER — OXYCODONE HCL 5 MG PO TABS
5.0000 mg | ORAL_TABLET | ORAL | Status: DC | PRN
  Administered 2023-08-22 – 2023-08-23 (×2): 10 mg via ORAL
  Filled 2023-08-22 (×2): qty 2

## 2023-08-22 MED ORDER — CEFAZOLIN SODIUM-DEXTROSE 2-4 GM/100ML-% IV SOLN
2.0000 g | Freq: Four times a day (QID) | INTRAVENOUS | Status: AC
  Administered 2023-08-22 – 2023-08-23 (×2): 2 g via INTRAVENOUS
  Filled 2023-08-22 (×2): qty 100

## 2023-08-22 MED ORDER — DEXAMETHASONE SODIUM PHOSPHATE 10 MG/ML IJ SOLN
INTRAMUSCULAR | Status: DC | PRN
  Administered 2023-08-22: 10 mg via INTRAVENOUS

## 2023-08-22 MED ORDER — ONDANSETRON HCL 4 MG/2ML IJ SOLN: 4.0000 mg | Freq: Four times a day (QID) | INTRAMUSCULAR | Status: DC | PRN

## 2023-08-22 MED ORDER — DIPHENHYDRAMINE HCL 12.5 MG/5ML PO ELIX: 12.5000 mg | ORAL_SOLUTION | ORAL | Status: DC | PRN

## 2023-08-22 MED ORDER — CHLORHEXIDINE GLUCONATE 0.12 % MT SOLN
15.0000 mL | Freq: Once | OROMUCOSAL | Status: AC
  Administered 2023-08-22: 15 mL via OROMUCOSAL

## 2023-08-22 MED ORDER — ONDANSETRON HCL 4 MG/2ML IJ SOLN
INTRAMUSCULAR | Status: AC
  Filled 2023-08-22: qty 2

## 2023-08-22 MED ORDER — ONDANSETRON HCL 4 MG PO TABS: 4.0000 mg | ORAL_TABLET | Freq: Four times a day (QID) | ORAL | Status: DC | PRN

## 2023-08-22 MED ORDER — ACETAMINOPHEN 500 MG PO TABS
1000.0000 mg | ORAL_TABLET | Freq: Once | ORAL | Status: AC
Start: 2023-08-22 — End: 2023-08-22
  Administered 2023-08-22: 1000 mg via ORAL
  Filled 2023-08-22: qty 2

## 2023-08-22 MED ORDER — EPHEDRINE 5 MG/ML INJ
INTRAVENOUS | Status: AC
  Filled 2023-08-22: qty 5

## 2023-08-22 MED ORDER — SODIUM CHLORIDE 0.9 % IV SOLN: INTRAVENOUS | Status: DC

## 2023-08-22 MED ORDER — MENTHOL 3 MG MT LOZG: 1.0000 | LOZENGE | OROMUCOSAL | Status: DC | PRN

## 2023-08-22 MED ORDER — ALBUTEROL SULFATE (2.5 MG/3ML) 0.083% IN NEBU: 2.5000 mg | INHALATION_SOLUTION | Freq: Four times a day (QID) | RESPIRATORY_TRACT | Status: DC | PRN

## 2023-08-22 MED ORDER — ORAL CARE MOUTH RINSE: 15.0000 mL | Freq: Once | OROMUCOSAL | Status: AC

## 2023-08-22 MED ORDER — AMLODIPINE BESYLATE 5 MG PO TABS
5.0000 mg | ORAL_TABLET | Freq: Every day | ORAL | Status: DC
  Administered 2023-08-23: 5 mg via ORAL
  Filled 2023-08-22: qty 1

## 2023-08-22 MED ORDER — LOSARTAN POTASSIUM 50 MG PO TABS
100.0000 mg | ORAL_TABLET | Freq: Every day | ORAL | Status: DC
  Administered 2023-08-23: 100 mg via ORAL
  Filled 2023-08-22: qty 2

## 2023-08-22 MED ORDER — DIAZEPAM 5 MG PO TABS: 5.0000 mg | ORAL_TABLET | ORAL | Status: DC | PRN

## 2023-08-22 MED ORDER — SODIUM CHLORIDE 0.9 % IV SOLN
3.0000 g | INTRAVENOUS | Status: AC
  Administered 2023-08-22: 3 g via INTRAVENOUS
  Filled 2023-08-22: qty 3

## 2023-08-22 MED ORDER — PROPOFOL 500 MG/50ML IV EMUL
INTRAVENOUS | Status: DC | PRN
  Administered 2023-08-22: 50 ug/kg/min via INTRAVENOUS

## 2023-08-22 MED ORDER — METHOCARBAMOL 500 MG PO TABS
ORAL_TABLET | ORAL | Status: AC
  Filled 2023-08-22: qty 1

## 2023-08-22 MED ORDER — PANTOPRAZOLE SODIUM 40 MG PO TBEC
40.0000 mg | DELAYED_RELEASE_TABLET | Freq: Every day | ORAL | Status: DC
  Administered 2023-08-23: 40 mg via ORAL
  Filled 2023-08-22: qty 1

## 2023-08-22 MED ORDER — METOCLOPRAMIDE HCL 5 MG/ML IJ SOLN: 5.0000 mg | Freq: Three times a day (TID) | INTRAMUSCULAR | Status: DC | PRN

## 2023-08-22 MED ORDER — ALUM & MAG HYDROXIDE-SIMETH 200-200-20 MG/5ML PO SUSP: 30.0000 mL | ORAL | Status: DC | PRN

## 2023-08-22 MED ORDER — HYDROMORPHONE HCL 1 MG/ML IJ SOLN
INTRAMUSCULAR | Status: AC
  Administered 2023-08-22: 0.5 mg via INTRAVENOUS
  Filled 2023-08-22: qty 1

## 2023-08-22 MED ORDER — HYDROMORPHONE HCL 1 MG/ML IJ SOLN
INTRAMUSCULAR | Status: AC
  Filled 2023-08-22: qty 1

## 2023-08-22 MED ORDER — INSULIN ASPART 100 UNIT/ML IJ SOLN: 0.0000 [IU] | INTRAMUSCULAR | Status: DC | PRN

## 2023-08-22 MED ORDER — OXYCODONE HCL 5 MG PO TABS
ORAL_TABLET | ORAL | Status: AC
  Filled 2023-08-22: qty 2

## 2023-08-22 MED ORDER — DAPAGLIFLOZIN PROPANEDIOL 10 MG PO TABS
10.0000 mg | ORAL_TABLET | Freq: Every day | ORAL | Status: DC
  Administered 2023-08-23: 10 mg via ORAL
  Filled 2023-08-22: qty 1

## 2023-08-22 MED ORDER — METOCLOPRAMIDE HCL 5 MG PO TABS: 5.0000 mg | ORAL_TABLET | Freq: Three times a day (TID) | ORAL | Status: DC | PRN

## 2023-08-22 MED ORDER — MIDAZOLAM HCL 2 MG/2ML IJ SOLN
1.0000 mg | INTRAMUSCULAR | Status: DC
  Administered 2023-08-22: 2 mg via INTRAVENOUS
  Filled 2023-08-22: qty 2

## 2023-08-22 MED ORDER — SODIUM CHLORIDE 0.9 % IR SOLN
Status: DC | PRN
  Administered 2023-08-22: 1000 mL

## 2023-08-22 MED ORDER — PROPOFOL 10 MG/ML IV BOLUS
INTRAVENOUS | Status: DC | PRN
  Administered 2023-08-22: 10 mg via INTRAVENOUS
  Administered 2023-08-22: 20 mg via INTRAVENOUS

## 2023-08-22 MED ORDER — METHOCARBAMOL 500 MG PO TABS
500.0000 mg | ORAL_TABLET | Freq: Four times a day (QID) | ORAL | Status: DC | PRN
  Administered 2023-08-22 – 2023-08-23 (×2): 500 mg via ORAL
  Filled 2023-08-22 (×2): qty 1

## 2023-08-22 MED ORDER — HYDROMORPHONE HCL 1 MG/ML IJ SOLN
0.5000 mg | INTRAMUSCULAR | Status: DC | PRN
  Administered 2023-08-22 (×2): 1 mg via INTRAVENOUS
  Filled 2023-08-22: qty 1

## 2023-08-22 MED ORDER — TRANEXAMIC ACID-NACL 1000-0.7 MG/100ML-% IV SOLN
1000.0000 mg | INTRAVENOUS | Status: AC
  Administered 2023-08-22: 1000 mg via INTRAVENOUS
  Filled 2023-08-22: qty 100

## 2023-08-22 MED ORDER — LACTATED RINGERS IV SOLN: INTRAVENOUS | Status: DC

## 2023-08-22 MED ORDER — ONDANSETRON HCL 4 MG/2ML IJ SOLN
INTRAMUSCULAR | Status: DC | PRN
  Administered 2023-08-22: 4 mg via INTRAVENOUS

## 2023-08-22 MED ORDER — BUPIVACAINE-EPINEPHRINE (PF) 0.5% -1:200000 IJ SOLN
INTRAMUSCULAR | Status: DC | PRN
  Administered 2023-08-22: 15 mL via PERINEURAL

## 2023-08-22 MED ORDER — DOCUSATE SODIUM 100 MG PO CAPS
100.0000 mg | ORAL_CAPSULE | Freq: Two times a day (BID) | ORAL | Status: DC
  Administered 2023-08-22 – 2023-08-23 (×2): 100 mg via ORAL
  Filled 2023-08-22 (×2): qty 1

## 2023-08-22 MED ORDER — ICOSAPENT ETHYL 1 G PO CAPS
2.0000 g | ORAL_CAPSULE | Freq: Two times a day (BID) | ORAL | Status: DC
  Administered 2023-08-22 – 2023-08-23 (×2): 2 g via ORAL
  Filled 2023-08-22 (×2): qty 2

## 2023-08-22 MED ORDER — PROPOFOL 10 MG/ML IV BOLUS
INTRAVENOUS | Status: AC
  Filled 2023-08-22: qty 20

## 2023-08-22 MED ORDER — ACETAMINOPHEN 325 MG PO TABS: 325.0000 mg | ORAL_TABLET | Freq: Four times a day (QID) | ORAL | Status: DC | PRN

## 2023-08-22 MED ORDER — BUPIVACAINE IN DEXTROSE 0.75-8.25 % IT SOLN
INTRATHECAL | Status: DC | PRN
  Administered 2023-08-22: 1.7 mL via INTRATHECAL

## 2023-08-22 MED ORDER — 0.9 % SODIUM CHLORIDE (POUR BTL) OPTIME
TOPICAL | Status: DC | PRN
  Administered 2023-08-22: 1000 mL

## 2023-08-22 MED ORDER — DEXAMETHASONE SODIUM PHOSPHATE 10 MG/ML IJ SOLN
INTRAMUSCULAR | Status: AC
  Filled 2023-08-22: qty 1

## 2023-08-22 MED ORDER — APIXABAN 2.5 MG PO TABS
2.5000 mg | ORAL_TABLET | Freq: Two times a day (BID) | ORAL | Status: DC
  Administered 2023-08-23: 2.5 mg via ORAL
  Filled 2023-08-22: qty 1

## 2023-08-22 MED ORDER — POVIDONE-IODINE 10 % EX SWAB: 2.0000 | Freq: Once | CUTANEOUS | Status: AC

## 2023-08-22 MED ORDER — FENTANYL CITRATE PF 50 MCG/ML IJ SOSY
50.0000 ug | PREFILLED_SYRINGE | INTRAMUSCULAR | Status: DC
  Administered 2023-08-22: 50 ug via INTRAVENOUS
  Filled 2023-08-22: qty 2

## 2023-08-22 MED ORDER — HYDROMORPHONE HCL 1 MG/ML IJ SOLN
0.2500 mg | INTRAMUSCULAR | Status: DC | PRN
  Administered 2023-08-22 (×2): 0.5 mg via INTRAVENOUS

## 2023-08-22 MED ORDER — METHOCARBAMOL 1000 MG/10ML IJ SOLN: 500.0000 mg | Freq: Four times a day (QID) | INTRAMUSCULAR | Status: DC | PRN

## 2023-08-22 MED ORDER — BUPIVACAINE-EPINEPHRINE 0.25% -1:200000 IJ SOLN
INTRAMUSCULAR | Status: AC
  Filled 2023-08-22: qty 1

## 2023-08-22 MED ORDER — BUPIVACAINE-EPINEPHRINE 0.25% -1:200000 IJ SOLN
INTRAMUSCULAR | Status: DC | PRN
  Administered 2023-08-22: 30 mL

## 2023-08-22 MED ORDER — PHENOL 1.4 % MT LIQD: 1.0000 | OROMUCOSAL | Status: DC | PRN

## 2023-08-22 MED ORDER — HYDROCHLOROTHIAZIDE 25 MG PO TABS
25.0000 mg | ORAL_TABLET | Freq: Every day | ORAL | Status: DC
  Filled 2023-08-22: qty 1

## 2023-08-22 MED ORDER — PHENYLEPHRINE HCL-NACL 20-0.9 MG/250ML-% IV SOLN
INTRAVENOUS | Status: DC | PRN
  Administered 2023-08-22: 20 ug/min via INTRAVENOUS

## 2023-08-22 MED ORDER — STERILE WATER FOR IRRIGATION IR SOLN
Status: DC | PRN
  Administered 2023-08-22: 1000 mL

## 2023-08-22 MED ORDER — OXYCODONE HCL 5 MG PO TABS
10.0000 mg | ORAL_TABLET | ORAL | Status: DC | PRN
  Administered 2023-08-22: 15 mg via ORAL
  Administered 2023-08-23: 10 mg via ORAL
  Administered 2023-08-23: 15 mg via ORAL
  Filled 2023-08-22 (×2): qty 3

## 2023-08-22 MED ORDER — CLONIDINE HCL (ANALGESIA) 100 MCG/ML EP SOLN
EPIDURAL | Status: DC | PRN
  Administered 2023-08-22: 100 ug

## 2023-08-22 MED ORDER — GABAPENTIN 300 MG PO CAPS
300.0000 mg | ORAL_CAPSULE | Freq: Two times a day (BID) | ORAL | Status: DC
  Administered 2023-08-22 – 2023-08-23 (×2): 300 mg via ORAL
  Filled 2023-08-22 (×2): qty 1

## 2023-08-22 MED ORDER — PROPOFOL 1000 MG/100ML IV EMUL
INTRAVENOUS | Status: AC
  Filled 2023-08-22: qty 100

## 2023-08-22 MED ORDER — DROPERIDOL 2.5 MG/ML IJ SOLN: 0.6250 mg | Freq: Once | INTRAMUSCULAR | Status: DC | PRN

## 2023-08-22 MED ORDER — EPHEDRINE SULFATE-NACL 50-0.9 MG/10ML-% IV SOSY
PREFILLED_SYRINGE | INTRAVENOUS | Status: DC | PRN
  Administered 2023-08-22: 5 mg via INTRAVENOUS
  Administered 2023-08-22: 10 mg via INTRAVENOUS
  Administered 2023-08-22: 5 mg via INTRAVENOUS

## 2023-08-22 MED ORDER — ALBUTEROL SULFATE HFA 108 (90 BASE) MCG/ACT IN AERS: 2.0000 | INHALATION_SPRAY | Freq: Four times a day (QID) | RESPIRATORY_TRACT | Status: DC | PRN

## 2023-08-22 SURGICAL SUPPLY — 53 items
BAG COUNTER SPONGE SURGICOUNT (BAG) IMPLANT
BAG ZIPLOCK 12X15 (MISCELLANEOUS) ×1 IMPLANT
BENZOIN TINCTURE PRP APPL 2/3 (GAUZE/BANDAGES/DRESSINGS) IMPLANT
BLADE SAG 18X100X1.27 (BLADE) ×1 IMPLANT
BLADE SURG SZ10 CARB STEEL (BLADE) IMPLANT
BNDG ELASTIC 6INX 5YD STR LF (GAUZE/BANDAGES/DRESSINGS) ×2 IMPLANT
BOWL SMART MIX CTS (DISPOSABLE) IMPLANT
CEMENT BONE R 1X40 (Cement) IMPLANT
COMP FEM PERSONA RT SZ11 (Knees) ×1 IMPLANT
COMPONENT FEM PERSONA RT SZ11 (Knees) IMPLANT
COOLER ICEMAN CLASSIC (MISCELLANEOUS) ×1 IMPLANT
COVER SURGICAL LIGHT HANDLE (MISCELLANEOUS) ×1 IMPLANT
CUFF TRNQT CYL 34X4.125X (TOURNIQUET CUFF) ×1 IMPLANT
DRAPE INCISE IOBAN 66X45 STRL (DRAPES) ×1 IMPLANT
DRAPE U-SHAPE 47X51 STRL (DRAPES) ×1 IMPLANT
DURAPREP 26ML APPLICATOR (WOUND CARE) ×1 IMPLANT
ELECT BLADE TIP CTD 4 INCH (ELECTRODE) ×1 IMPLANT
ELECT REM PT RETURN 15FT ADLT (MISCELLANEOUS) ×1 IMPLANT
GAUZE PAD ABD 8X10 STRL (GAUZE/BANDAGES/DRESSINGS) ×2 IMPLANT
GAUZE SPONGE 4X4 12PLY STRL (GAUZE/BANDAGES/DRESSINGS) ×1 IMPLANT
GAUZE XEROFORM 1X8 LF (GAUZE/BANDAGES/DRESSINGS) IMPLANT
GLOVE BIO SURGEON STRL SZ7.5 (GLOVE) ×1 IMPLANT
GLOVE BIOGEL PI IND STRL 8 (GLOVE) ×2 IMPLANT
GLOVE ECLIPSE 8.0 STRL XLNG CF (GLOVE) ×1 IMPLANT
GOWN STRL REUS W/ TWL XL LVL3 (GOWN DISPOSABLE) ×2 IMPLANT
HOLDER FOLEY CATH W/STRAP (MISCELLANEOUS) IMPLANT
IMMOBILIZER KNEE 20 (SOFTGOODS) ×1
IMMOBILIZER KNEE 20 THIGH 36 (SOFTGOODS) ×1 IMPLANT
INSERT TIB ASF PS 8-11 GH RT (Insert) IMPLANT
INSERT TIB CMT PS G 0 RT (Insert) IMPLANT
KIT TURNOVER KIT A (KITS) IMPLANT
MANIFOLD NEPTUNE II (INSTRUMENTS) ×1 IMPLANT
NS IRRIG 1000ML POUR BTL (IV SOLUTION) ×1 IMPLANT
PACK TOTAL KNEE CUSTOM (KITS) ×1 IMPLANT
PAD COLD SHLDR WRAP-ON (PAD) ×1 IMPLANT
PADDING CAST COTTON 6X4 STRL (CAST SUPPLIES) ×2 IMPLANT
PIN DRILL HDLS TROCAR 75 4PK (PIN) IMPLANT
PROTECTOR NERVE ULNAR (MISCELLANEOUS) ×1 IMPLANT
SCREW FEMALE HEX FIX 25X2.5 (ORTHOPEDIC DISPOSABLE SUPPLIES) IMPLANT
SET HNDPC FAN SPRY TIP SCT (DISPOSABLE) ×1 IMPLANT
SET PAD KNEE POSITIONER (MISCELLANEOUS) ×1 IMPLANT
SPIKE FLUID TRANSFER (MISCELLANEOUS) IMPLANT
STAPLER SKIN PROX WIDE 3.9 (STAPLE) IMPLANT
STEM POLY PAT PLY 35M KNEE (Knees) IMPLANT
STRIP CLOSURE SKIN 1/2X4 (GAUZE/BANDAGES/DRESSINGS) IMPLANT
SUT MNCRL AB 4-0 PS2 18 (SUTURE) IMPLANT
SUT VIC AB 0 CT1 27XBRD ANTBC (SUTURE) ×1 IMPLANT
SUT VIC AB 1 CT1 36 (SUTURE) ×2 IMPLANT
SUT VIC AB 2-0 CT1 TAPERPNT 27 (SUTURE) ×2 IMPLANT
TOWEL GREEN STERILE FF (TOWEL DISPOSABLE) ×1 IMPLANT
TRAY FOLEY MTR SLVR 16FR STAT (SET/KITS/TRAYS/PACK) IMPLANT
WATER STERILE IRR 1000ML POUR (IV SOLUTION) ×2 IMPLANT
YANKAUER SUCT BULB TIP NO VENT (SUCTIONS) ×1 IMPLANT
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
## 2023-08-22 NOTE — Discharge Instructions (Addendum)
 Per [facility] clinic policy, our goal is ensure optimal postoperative pain control with a multimodal pain management strategy. For all [facility] patients, our goal is to wean post-operative narcotic medications by 6 weeks post-operatively. If this is not possible due to utilization of pain medication prior to surgery, your [facility] doctor will support your acute post-operative pain control for the first 6 weeks postoperatively, with a plan to transition you back to your primary pain team following that. Maralee will work to ensure a therapist, occupational.  INSTRUCTIONS AFTER JOINT REPLACEMENT   Remove items at home which could result in a fall. This includes throw rugs or furniture in walking pathways ICE to the affected joint every three hours while awake for 30 minutes at a time, for at least the first 3-5 days, and then as needed for pain and swelling.  Continue to use ice for pain and swelling. You may notice swelling that will progress down to the foot and ankle.  This is normal after surgery.  Elevate your leg when you are not up walking on it.   Continue to use the breathing machine you got in the hospital (incentive spirometer) which will help keep your temperature down.  It is common for your temperature to cycle up and down following surgery, especially at night when you are not up moving around and exerting yourself.  The breathing machine keeps your lungs expanded and your temperature down.   DIET:  As you were doing prior to hospitalization, we recommend a well-balanced diet.  DRESSING / WOUND CARE / SHOWERING  Keep the surgical dressing until follow up.  The dressing is water  proof, so you can shower without any extra covering.  IF THE DRESSING FALLS OFF or the wound gets wet inside, change the dressing with sterile gauze.  Please use good hand washing techniques before changing the dressing.  Do not use any lotions or creams on the incision until instructed by your surgeon.     ACTIVITY  Increase activity slowly as tolerated, but follow the weight bearing instructions below.   No driving for 6 weeks or until further direction given by your physician.  You cannot drive while taking narcotics.  No lifting or carrying greater than 10 lbs. until further directed by your surgeon. Avoid periods of inactivity such as sitting longer than an hour when not asleep. This helps prevent blood clots.  You may return to work once you are authorized by your doctor.     WEIGHT BEARING   Weight bearing as tolerated with assist device (walker, cane, etc) as directed, use it as long as suggested by your surgeon or therapist, typically at least 4-6 weeks.   EXERCISES  Results after joint replacement surgery are often greatly improved when you follow the exercise, range of motion and muscle strengthening exercises prescribed by your doctor. Safety measures are also important to protect the joint from further injury. Any time any of these exercises cause you to have increased pain or swelling, decrease what you are doing until you are comfortable again and then slowly increase them. If you have problems or questions, call your caregiver or physical therapist for advice.   Rehabilitation is important following a joint replacement. After just a few days of immobilization, the muscles of the leg can become weakened and shrink (atrophy).  These exercises are designed to build up the tone and strength of the thigh and leg muscles and to improve motion. Often times heat used for twenty to thirty minutes before  working out will loosen up your tissues and help with improving the range of motion but do not use heat for the first two weeks following surgery (sometimes heat can increase post-operative swelling).   These exercises can be done on a training (exercise) mat, on the floor, on a table or on a bed. Use whatever works the best and is most comfortable for you.    Use music or television  while you are exercising so that the exercises are a pleasant break in your day. This will make your life better with the exercises acting as a break in your routine that you can look forward to.   Perform all exercises about fifteen times, three times per day or as directed.  You should exercise both the operative leg and the other leg as well.  Exercises include:   Quad Sets - Tighten up the muscle on the front of the thigh (Quad) and hold for 5-10 seconds.   Straight Leg Raises - With your knee straight (if you were given a brace, keep it on), lift the leg to 60 degrees, hold for 3 seconds, and slowly lower the leg.  Perform this exercise against resistance later as your leg gets stronger.  Leg Slides: Lying on your back, slowly slide your foot toward your buttocks, bending your knee up off the floor (only go as far as is comfortable). Then slowly slide your foot back down until your leg is flat on the floor again.  Angel Wings: Lying on your back spread your legs to the side as far apart as you can without causing discomfort.  Hamstring Strength:  Lying on your back, push your heel against the floor with your leg straight by tightening up the muscles of your buttocks.  Repeat, but this time bend your knee to a comfortable angle, and push your heel against the floor.  You may put a pillow under the heel to make it more comfortable if necessary.   A rehabilitation program following joint replacement surgery can speed recovery and prevent re-injury in the future due to weakened muscles. Contact your doctor or a physical therapist for more information on knee rehabilitation.    CONSTIPATION  Constipation is defined medically as fewer than three stools per week and severe constipation as less than one stool per week.  Even if you have a regular bowel pattern at home, your normal regimen is likely to be disrupted due to multiple reasons following surgery.  Combination of anesthesia, postoperative  narcotics, change in appetite and fluid intake all can affect your bowels.   YOU MUST use at least one of the following options; they are listed in order of increasing strength to get the job done.  They are all available over the counter, and you may need to use some, POSSIBLY even all of these options:    Drink plenty of fluids (prune juice may be helpful) and high fiber foods Colace 100 mg by mouth twice a day  Senokot for constipation as directed and as needed Dulcolax (bisacodyl ), take with full glass of water   Miralax  (polyethylene glycol) once or twice a day as needed.  If you have tried all these things and are unable to have a bowel movement in the first 3-4 days after surgery call either your surgeon or your primary doctor.    If you experience loose stools or diarrhea, hold the medications until you stool forms back up.  If your symptoms do not get better within 1 week  or if they get worse, check with your doctor.  If you experience the worst abdominal pain ever or develop nausea or vomiting, please contact the office immediately for further recommendations for treatment.   ITCHING:  If you experience itching with your medications, try taking only a single pain pill, or even half a pain pill at a time.  You can also use Benadryl  over the counter for itching or also to help with sleep.   TED HOSE STOCKINGS:  Use stockings on both legs until for at least 2 weeks or as directed by physician office. They may be removed at night for sleeping.  MEDICATIONS:  See your medication summary on the "After Visit Summary" that nursing will review with you.  You may have some home medications which will be placed on hold until you complete the course of blood thinner medication.  It is important for you to complete the blood thinner medication as prescribed.  PRECAUTIONS:  If you experience chest pain or shortness of breath - call 911 immediately for transfer to the hospital emergency department.    If you develop a fever greater that 101 F, purulent drainage from wound, increased redness or drainage from wound, foul odor from the wound/dressing, or calf pain - CONTACT YOUR SURGEON.                                                   FOLLOW-UP APPOINTMENTS:  If you do not already have a post-op appointment, please call the office for an appointment to be seen by your surgeon.  Guidelines for how soon to be seen are listed in your "After Visit Summary", but are typically between 1-4 weeks after surgery.  OTHER INSTRUCTIONS:   Knee Replacement:  Do not place pillow under knee, focus on keeping the knee straight while resting. CPM instructions: 0-90 degrees, 2 hours in the morning, 2 hours in the afternoon, and 2 hours in the evening. Place foam block, curve side up under heel at all times except when in CPM or when walking.  DO NOT modify, tear, cut, or change the foam block in any way.  POST-OPERATIVE OPIOID TAPER INSTRUCTIONS: It is important to wean off of your opioid medication as soon as possible. If you do not need pain medication after your surgery it is ok to stop day one. Opioids include: Codeine, Hydrocodone (Norco, Vicodin), Oxycodone (Percocet, oxycontin ) and hydromorphone  amongst others.  Long term and even short term use of opiods can cause: Increased pain response Dependence Constipation Depression Respiratory depression And more.  Withdrawal symptoms can include Flu like symptoms Nausea, vomiting And more Techniques to manage these symptoms Hydrate well Eat regular healthy meals Stay active Use relaxation techniques(deep breathing, meditating, yoga) Do Not substitute Alcohol to help with tapering If you have been on opioids for less than two weeks and do not have pain than it is ok to stop all together.  Plan to wean off of opioids This plan should start within one week post op of your joint replacement. Maintain the same interval or time between taking each dose  and first decrease the dose.  Cut the total daily intake of opioids by one tablet each day Next start to increase the time between doses. The last dose that should be eliminated is the evening dose.   MAKE SURE YOU:  Understand these instructions.  Get help right away if you are not doing well or get worse.    Thank you for letting us  be a part of your medical care team.  It is a privilege we respect greatly.  We hope these instructions will help you stay on track for a fast and full recovery!      Dental Antibiotics:  In most cases prophylactic antibiotics for Dental procdeures after total joint surgery are not necessary.  Exceptions are as follows:  1. History of prior total joint infection  2. Severely immunocompromised (Organ Transplant, cancer chemotherapy, Rheumatoid biologic meds such as Humera)  3. Poorly controlled diabetes (A1C &gt; 8.0, blood glucose over 200)  If you have one of these conditions, contact your surgeon for an antibiotic prescription, prior to your dental procedure.      Information on my medicine - ELIQUIS  (apixaban )  Why was Eliquis  prescribed for you? Eliquis  was prescribed for you to reduce the risk of blood clots forming after orthopedic surgery.    What do You need to know about Eliquis ? Take your Eliquis  TWICE DAILY - one tablet in the morning and one tablet in the evening with or without food.  It would be best to take the dose about the same time each day.  If you have difficulty swallowing the tablet whole please discuss with your pharmacist how to take the medication safely.  Take Eliquis  exactly as prescribed by your doctor and DO NOT stop taking Eliquis  without talking to the doctor who prescribed the medication.  Stopping without other medication to take the place of Eliquis  may increase your risk of developing a clot.  After discharge, you should have regular check-up appointments with your healthcare provider that is  prescribing your Eliquis .  What do you do if you miss a dose? If a dose of ELIQUIS  is not taken at the scheduled time, take it as soon as possible on the same day and twice-daily administration should be resumed.  The dose should not be doubled to make up for a missed dose.  Do not take more than one tablet of ELIQUIS  at the same time.  Important Safety Information A possible side effect of Eliquis  is bleeding. You should call your healthcare provider right away if you experience any of the following: Bleeding from an injury or your nose that does not stop. Unusual colored urine (red or dark brown) or unusual colored stools (red or black). Unusual bruising for unknown reasons. A serious fall or if you hit your head (even if there is no bleeding).  Some medicines may interact with Eliquis  and might increase your risk of bleeding or clotting while on Eliquis . To help avoid this, consult your healthcare provider or pharmacist prior to using any new prescription or non-prescription medications, including herbals, vitamins, non-steroidal anti-inflammatory drugs (NSAIDs) and supplements.  This website has more information on Eliquis  (apixaban ): http://[email or web address] .com/eliquis dena

## 2023-08-22 NOTE — Anesthesia Postprocedure Evaluation (Signed)
 Anesthesia Post Note  Patient: Rodney Hayes  Procedure(s) Performed: RIGHT TOTAL KNEE ARTHROPLASTY (Right: Knee)     Patient location during evaluation: PACU Anesthesia Type: Spinal Level of consciousness: oriented and awake and alert Pain management: pain level controlled Vital Signs Assessment: post-procedure vital signs reviewed and stable Respiratory status: spontaneous breathing, respiratory function stable and patient connected to nasal cannula oxygen  Cardiovascular status: blood pressure returned to baseline and stable Postop Assessment: no headache, no backache and no apparent nausea or vomiting Anesthetic complications: no   No notable events documented.  Last Vitals:  Vitals:   08/22/23 1550 08/22/23 1600  BP: 130/75 (!) 151/65  Pulse: 61 66  Resp: 16 17  Temp:    SpO2: (!) 88% 92%    Last Pain:  Vitals:   08/22/23 1545  TempSrc:   PainSc: 4                  Vertell Row

## 2023-08-22 NOTE — Anesthesia Procedure Notes (Signed)
 Date/Time: 08/22/2023 1:39 PM  Performed by: Vella Gey, CRNAOxygen Delivery Method: Simple face mask

## 2023-08-22 NOTE — Anesthesia Procedure Notes (Signed)
 Spinal  Patient location during procedure: OR Start time: 08/22/2023 1:40 PM End time: 08/22/2023 1:45 PM Reason for block: surgical anesthesia Staffing Performed: anesthesiologist  Anesthesiologist: Paul Lamarr BRAVO, MD Performed by: Paul Lamarr BRAVO, MD Authorized by: Paul Lamarr BRAVO, MD   Preanesthetic Checklist Completed: patient identified, IV checked, risks and benefits discussed, surgical consent, monitors and equipment checked, pre-op  evaluation and timeout performed Spinal Block Patient position: sitting Prep: DuraPrep and site prepped and draped Patient monitoring: continuous pulse ox, blood pressure and heart rate Approach: midline Location: L3-4 Injection technique: single-shot Needle Needle type: Pencan  Needle gauge: 24 G Needle length: 9 cm Assessment Events: CSF return Additional Notes Risks, benefits, and alternative discussed. Patient gave consent to procedure. Prepped and draped in sitting position. Patient sedated but responsive to voice. Clear CSF obtained after 2 attempts. Positive terminal aspiration. No pain or paraesthesias with injection. Patient tolerated procedure well. Vital signs stable. LANEY Paul, MD

## 2023-08-22 NOTE — Anesthesia Procedure Notes (Signed)
 Anesthesia Regional Block: Adductor canal block   Pre-Anesthetic Checklist: , timeout performed,  Correct Patient, Correct Site, Correct Laterality,  Correct Procedure, Correct Position, site marked,  Risks and benefits discussed,  Pre-op  evaluation,  At surgeon's request and post-op pain management  Laterality: Right  Prep: Maximum Sterile Barrier Precautions used, chloraprep       Needles:  Injection technique: Single-shot  Needle Type: Echogenic Stimulator Needle     Needle Length: 9cm  Needle Gauge: 22     Additional Needles:   Procedures:,,,, ultrasound used (permanent image in chart),,    Narrative:  Start time: 08/22/2023 1:04 PM End time: 08/22/2023 1:07 PM Injection made incrementally with aspirations every 5 mL.  Performed by: Personally  Anesthesiologist: Paul Lamarr BRAVO, MD  Additional Notes: Risks, benefits, and alternative discussed. Patient gave consent for procedure. Patient prepped and draped in sterile fashion. Sedation administered, patient remains easily responsive to voice. Relevant anatomy identified with ultrasound guidance. Local anesthetic given in 5cc increments with no signs or symptoms of intravascular injection. No pain or paraesthesias with injection. Patient monitored throughout procedure with signs of LAST or immediate complications. Tolerated well. Ultrasound image placed in chart.  LANEY Paul, MD

## 2023-08-22 NOTE — Interval H&P Note (Signed)
 History and Physical Interval Note: [name] understands that he is here today for a right total knee replacement to treat his significant right knee pain and osteoarthritis.  The risks and benefits of surgery have been discussed in detail.  There has been no acute or interval changes in medical status.  Informed consent has been obtained and the right operative knee has been marked.  08/22/2023 12:31 PM  Rodney Hayes  has presented today for surgery, with the diagnosis of osteoarthritis right knee.  The various methods of treatment have been discussed with the patient and family. After consideration of risks, benefits and other options for treatment, the patient has consented to  Procedure(s): RIGHT TOTAL KNEE ARTHROPLASTY (Right) as a surgical intervention.  The patient's history has been reviewed, patient examined, no change in status, stable for surgery.  I have reviewed the patient's chart and labs.  Questions were answered to the patient's satisfaction.     [name]

## 2023-08-22 NOTE — Transfer of Care (Signed)
 Immediate Anesthesia Transfer of Care Note  Patient: Rodney Hayes  Procedure(s) Performed: RIGHT TOTAL KNEE ARTHROPLASTY (Right: Knee)  Patient Location: PACU  Anesthesia Type:Spinal  Level of Consciousness: awake and alert   Airway & Oxygen  Therapy: Patient Spontanous Breathing and Patient connected to face mask oxygen   Post-op Assessment: Report given to RN and Post -op Vital signs reviewed and stable  Post vital signs: Reviewed and stable  Last Vitals:  Vitals Value Taken Time  BP 150/105 08/22/23 1540  Temp    Pulse 72 08/22/23 1542  Resp 20 08/22/23 1542  SpO2 96 % 08/22/23 1542  Vitals shown include unfiled device data.  Last Pain:  Vitals:   08/22/23 1130  TempSrc:   PainSc: 3          Complications: No notable events documented.

## 2023-08-23 ENCOUNTER — Other Ambulatory Visit (HOSPITAL_COMMUNITY): Payer: Self-pay

## 2023-08-23 DIAGNOSIS — Z96652 Presence of left artificial knee joint: Secondary | ICD-10-CM | POA: Diagnosis not present

## 2023-08-23 DIAGNOSIS — I10 Essential (primary) hypertension: Secondary | ICD-10-CM | POA: Diagnosis not present

## 2023-08-23 DIAGNOSIS — M1712 Unilateral primary osteoarthritis, left knee: Secondary | ICD-10-CM | POA: Diagnosis not present

## 2023-08-23 DIAGNOSIS — Z79899 Other long term (current) drug therapy: Secondary | ICD-10-CM | POA: Diagnosis not present

## 2023-08-23 DIAGNOSIS — Z8616 Personal history of COVID-19: Secondary | ICD-10-CM | POA: Diagnosis not present

## 2023-08-23 DIAGNOSIS — Z96612 Presence of left artificial shoulder joint: Secondary | ICD-10-CM | POA: Diagnosis not present

## 2023-08-23 DIAGNOSIS — J45909 Unspecified asthma, uncomplicated: Secondary | ICD-10-CM | POA: Diagnosis not present

## 2023-08-23 DIAGNOSIS — I2510 Atherosclerotic heart disease of native coronary artery without angina pectoris: Secondary | ICD-10-CM | POA: Diagnosis not present

## 2023-08-23 DIAGNOSIS — E119 Type 2 diabetes mellitus without complications: Secondary | ICD-10-CM | POA: Diagnosis not present

## 2023-08-23 DIAGNOSIS — Z8546 Personal history of malignant neoplasm of prostate: Secondary | ICD-10-CM | POA: Diagnosis not present

## 2023-08-23 LAB — CBC
HCT: 41.9 % (ref 39.0–52.0)
Hemoglobin: 13.4 g/dL (ref 13.0–17.0)
MCH: 30.7 pg (ref 26.0–34.0)
MCHC: 32 g/dL (ref 30.0–36.0)
MCV: 95.9 fL (ref 80.0–100.0)
Platelets: 188 10*3/uL (ref 150–400)
RBC: 4.37 MIL/uL (ref 4.22–5.81)
RDW: 12.3 % (ref 11.5–15.5)
WBC: 9.4 10*3/uL (ref 4.0–10.5)
nRBC: 0 % (ref 0.0–0.2)

## 2023-08-23 LAB — BASIC METABOLIC PANEL
Anion gap: 11 (ref 5–15)
BUN: 21 mg/dL (ref 8–23)
CO2: 30 mmol/L (ref 22–32)
Calcium: 9.3 mg/dL (ref 8.9–10.3)
Chloride: 96 mmol/L — ABNORMAL LOW (ref 98–111)
Creatinine, Ser: 1.07 mg/dL (ref 0.61–1.24)
GFR, Estimated: >60 mL/min (ref >60)
Glucose, Bld: 201 mg/dL — ABNORMAL HIGH (ref 70–99)
Potassium: 3.7 mmol/L (ref 3.5–5.1)
Sodium: 137 mmol/L (ref 135–145)

## 2023-08-23 MED ORDER — KETOROLAC TROMETHAMINE 15 MG/ML IJ SOLN
7.5000 mg | Freq: Once | INTRAMUSCULAR | Status: AC
  Administered 2023-08-23: 7.5 mg via INTRAVENOUS
  Filled 2023-08-23: qty 1

## 2023-08-23 MED ORDER — OXYCODONE HCL 5 MG PO TABS
5.0000 mg | ORAL_TABLET | Freq: Four times a day (QID) | ORAL | 0 refills | Status: DC | PRN
  Filled 2023-08-23: qty 30, 4d supply, fill #0

## 2023-08-23 MED ORDER — METHOCARBAMOL 500 MG PO TABS
500.0000 mg | ORAL_TABLET | Freq: Four times a day (QID) | ORAL | 1 refills | Status: DC | PRN
  Filled 2023-08-23: qty 30, 8d supply, fill #0

## 2023-08-23 MED ORDER — HYDRALAZINE HCL 20 MG/ML IJ SOLN: 20.0000 mg | Freq: Four times a day (QID) | INTRAMUSCULAR | Status: DC | PRN

## 2023-08-23 MED ORDER — APIXABAN 2.5 MG PO TABS
2.5000 mg | ORAL_TABLET | Freq: Two times a day (BID) | ORAL | 0 refills | Status: DC
  Filled 2023-08-23: qty 60, 30d supply, fill #0

## 2023-08-23 NOTE — Progress Notes (Signed)
 Subjective: 1 Day Post-Op Procedure(s) (LRB): RIGHT TOTAL KNEE ARTHROPLASTY (Right) Patient reports pain as moderate.    Objective: Vital signs in last 24 hours: Temp:  [97.6 F (36.4 C)-99 F (37.2 C)] 98.2 F (36.8 C) (02/08 1001) Pulse Rate:  [60-86] 68 (02/08 1009) Resp:  [13-20] 17 (02/08 1001) BP: (127-228)/(46-111) 150/62 (02/08 1009) SpO2:  [86 %-100 %] 95 % (02/08 1001) Weight:  [871 kg] 128 kg (02/07 1130)  Intake/Output from previous day: 02/07 0701 - 02/08 0700 In: 2433.8 [P.O.:600; I.V.:1533.8; IV Piggyback:300] Out: 2225 [Urine:2175; Blood:50] Intake/Output this shift: No intake/output data recorded.  Recent Labs    08/23/23 0337  HGB 13.4   Recent Labs    08/23/23 0337  WBC 9.4  RBC 4.37  HCT 41.9  PLT 188   Recent Labs    08/23/23 0337  NA 137  K 3.7  CL 96*  CO2 30  BUN 21  CREATININE 1.07  GLUCOSE 201*  CALCIUM  9.3   No results for input(s): LABPT, INR in the last 72 hours.  Sensation intact distally Intact pulses distally Dorsiflexion/Plantar flexion intact Incision: dressing C/D/I Compartment soft   Assessment/Plan: 1 Day Post-Op Procedure(s) (LRB): RIGHT TOTAL KNEE ARTHROPLASTY (Right) Up with therapy Discharge home with home health      [name] 08/23/2023, 10:46 AM

## 2023-08-23 NOTE — Discharge Summary (Signed)
 Patient ID: ZEPPELIN COMMISSO MRN: [id number] DOB/AGE: Nov 15, 1950 73 y.o.  Admit date: 08/22/2023 Discharge date: 08/23/2023  Admission Diagnoses:  Principal Problem:   Unilateral primary osteoarthritis, right knee Active Problems:   Status post total right knee replacement   Discharge Diagnoses:  Same  Past Medical History:  Diagnosis Date   Allergy    SEASONAL   Anxiety    Arthritis    Asthma    as a child   BPH (benign prostatic hyperplasia)    Cancer (HCC)    prostatectomy 2018 Gleason 7   Cataract    BILATERAL   Coronary artery disease    Diabetes mellitus    average fasting 140s type 2   DJD (degenerative joint disease)    Heart murmur    dr. cherrie   Hepatitis    hep B   Hyperlipidemia    Hypertension    Neuromuscular disorder (HCC)    sciatica LLE   Obesity    Pneumonia    Sleep apnea    C-PAP    Surgeries: Procedure(s): RIGHT TOTAL KNEE ARTHROPLASTY on 08/22/2023   Consultants:   Discharged Condition: Improved  Hospital Course: Garth Diffley Degen is an 73 y.o. male who was admitted 08/22/2023 for operative treatment ofUnilateral primary osteoarthritis, right knee. Patient has severe unremitting pain that affects sleep, daily activities, and work/hobbies. After pre-op  clearance the patient was taken to the operating room on 08/22/2023 and underwent  Procedure(s): RIGHT TOTAL KNEE ARTHROPLASTY.    Patient was given perioperative antibiotics:  Anti-infectives (From admission, onward)    Start     Dose/Rate Route Frequency Ordered Stop   08/22/23 2100  ceFAZolin  (ANCEF ) IVPB 2g/100 mL premix        2 g 200 mL/hr over 30 Minutes Intravenous Every 6 hours 08/22/23 2010 08/23/23 0248   08/22/23 1100  ceFAZolin  (ANCEF ) 3 g in sodium chloride  0.9 % 100 mL IVPB        3 g 200 mL/hr over 30 Minutes Intravenous On call to O.R. 08/22/23 1059 08/22/23 1413        Patient was given sequential compression devices, early ambulation, and chemoprophylaxis to prevent  DVT.  Patient benefited maximally from hospital stay and there were no complications.    Recent vital signs: Patient Vitals for the past 24 hrs:  BP Temp Temp src Pulse Resp SpO2  08/23/23 1351 (!) 164/82 98.8 F (37.1 C) Oral 81 19 98 %  08/23/23 1009 (!) 150/62 -- -- 68 -- --  08/23/23 1001 (!) 151/46 98.2 F (36.8 C) Oral 73 17 95 %  08/23/23 0658 (!) 181/111 97.6 F (36.4 C) Oral 80 16 96 %  08/23/23 0252 (!) 173/84 -- -- 69 -- --  08/23/23 0217 (!) 192/96 99 F (37.2 C) Oral 61 18 100 %  08/22/23 2204 (!) 176/79 97.9 F (36.6 C) Oral -- 20 100 %  08/22/23 2008 (!) 186/81 98.7 F (37.1 C) Oral 75 15 99 %  08/22/23 1900 (!) 166/75 -- -- 73 14 92 %  08/22/23 1800 (!) 155/67 -- -- 63 14 (!) 86 %  08/22/23 1700 127/77 -- -- -- 13 93 %  08/22/23 1645 (!) 160/50 -- -- 68 13 95 %  08/22/23 1630 (!) 160/65 -- -- 60 15 100 %  08/22/23 1615 (!) 163/73 -- -- 66 15 99 %  08/22/23 1600 (!) 151/65 -- -- 66 17 92 %  08/22/23 1550 130/75 -- -- 61 16 (!) 88 %  08/22/23 1545 132/69 -- -- 74 17 (!) 86 %  08/22/23 1540 (!) 150/105 97.6 F (36.4 C) -- 69 19 97 %     Recent laboratory studies:  Recent Labs    08/23/23 0337  WBC 9.4  HGB 13.4  HCT 41.9  PLT 188  NA 137  K 3.7  CL 96*  CO2 30  BUN 21  CREATININE 1.07  GLUCOSE 201*  CALCIUM  9.3     Discharge Medications:   Allergies as of 08/23/2023   No Known Allergies      Medication List     STOP taking these medications    aspirin  EC 81 MG tablet       TAKE these medications    Accu-Chek FastClix Lancet Kit Use as directed   albuterol  108 (90 Base) MCG/ACT inhaler Commonly known as: VENTOLIN  HFA Inhale 2 puffs into the lungs every 6 (six) hours as needed for wheezing or shortness of breath.   amLODipine  5 MG tablet Commonly known as: NORVASC  Take 1 tablet (5 mg total) by mouth daily.   Dexcom G7 Sensor Misc Replace every 10 days   diazepam  5 MG tablet Commonly known as: VALIUM  Take 1-2 tablets by  mouth as needed for procedure anxiety or insomnia.   Eliquis  2.5 MG Tabs tablet Generic drug: apixaban  Take 1 tablet (2.5 mg total) by mouth every 12 (twelve) hours.   Farxiga  10 MG Tabs tablet Generic drug: dapagliflozin  propanediol Take 1 tablet (10 mg total) by mouth daily before breakfast.   freestyle lancets Use as instructed qid   FREESTYLE LITE test strip Generic drug: glucose blood Use as directed four times daily   gabapentin  300 MG capsule Commonly known as: NEURONTIN  Take 1 capsule (300 mg total) by mouth 2 (two) times daily.   glucose monitoring kit monitoring kit 1 each by Does not apply route as needed for other.   hydrochlorothiazide  25 MG tablet Commonly known as: HYDRODIURIL  Take 1 tablet (25 mg total) by mouth daily.   Insupen Pen Needles 31G X 5 MM Misc Generic drug: Insulin  Pen Needle Use to administer insulin  2 (two) times daily.   levalbuterol  0.63 MG/3ML nebulizer solution Commonly known as: Xopenex  Take 3 mLs (0.63 mg total) by nebulization every 4 (four) hours as needed for wheezing or shortness of breath.   losartan  100 MG tablet Commonly known as: COZAAR  Take 1 tablet (100 mg total) by mouth daily   methocarbamol  500 MG tablet Commonly known as: ROBAXIN  Take 1 tablet (500 mg total) by mouth every 6 (six) hours as needed for muscle spasms.   oxyCODONE  5 MG immediate release tablet Commonly known as: Oxy IR/ROXICODONE  Take 1-2 tablets (5-10 mg total) by mouth every 6 (six) hours as needed for moderate pain (pain score 4-6) (pain score 4-6).   psyllium 58.6 % packet Commonly known as: METAMUCIL Take 1 packet by mouth daily.   tadalafil  20 MG tablet Commonly known as: CIALIS  Take 1 tablet (20 mg total) by mouth every evening if needed   Vascepa  1 g capsule Generic drug: icosapent  Ethyl Take 2 capsules (2 g total) by mouth 2 (two) times daily.   Vitamin D3 50 MCG (2000 UT) capsule Take 1 capsule (2,000 Units total) by mouth daily.                Durable Medical Equipment  (From admission, onward)           Start     Ordered   08/22/23 2011  DME 3 n 1  Once        08/22/23 2010   08/22/23 2011  DME Walker rolling  Once       Question Answer Comment  Walker: With 5 Inch Wheels   Patient needs a walker to treat with the following condition Status post total right knee replacement      08/22/23 2010            Diagnostic Studies: DG Knee Right Port Result Date: 08/22/2023 CLINICAL DATA:  Right knee replacement. EXAM: PORTABLE RIGHT KNEE - 1-2 VIEW COMPARISON:  None Available. FINDINGS: Right knee arthroplasty in expected alignment. No periprosthetic lucency or fracture. There has been patellar resurfacing. Recent postsurgical change includes air and edema in the soft tissues and joint space. Anterior skin staples. IMPRESSION: Right knee arthroplasty without immediate postoperative complication. Electronically Signed   By: Andrea Gasman M.D.   On: 08/22/2023 18:02   XR Knee 1-2 Views Right Result Date: 08/01/2023 2 views of the right knee shows severe end-stage arthritis.  There is slight valgus malalignment.  There is bone-on-bone wear in all 3 compartments and significant and large osteophytes in all 3 compartments.   Disposition: Discharge disposition: 01-Home or Self Care          Follow-up Information     Vernetta Lonni GRADE, MD Follow up in 2 week(s).   Specialty: Orthopedic Surgery Contact information: [address] [place]  [place] [address] [phone]                  Signed: Lonni GRADE Vernetta 08/23/2023, 3:06 PM

## 2023-08-23 NOTE — Progress Notes (Signed)
 Physical Therapy Treatment Patient Details Name: Rodney Hayes MRN: [id number] DOB: 01-Jun-1951 Today's Date: 08/23/2023   History of Present Illness 73 yo male s/p L TKA 08/22/23. Hx of L TKA 2014, L TSA 2016, DM, neuropahty, Hep B, obesity, prostate Ca, post Covid syndrome    PT Comments  2nd session to practice stair negotiation. Encouraged pt to ambulate often at home. HHPT after discharge.    If plan is discharge home, recommend the following: A little help with walking and/or transfers;A little help with bathing/dressing/bathroom;Assistance with cooking/housework;Assist for transportation;Help with stairs or ramp for entrance   Can travel by private vehicle        Equipment Recommendations  None recommended by PT    Recommendations for Other Services       Precautions / Restrictions Precautions Precautions: Fall;Knee Restrictions Weight Bearing Restrictions Per Provider Order: No RLE Weight Bearing Per Provider Order: Weight bearing as tolerated     Mobility  Bed Mobility Overal bed mobility: Needs Assistance Bed Mobility: Supine to Sit     Supine to sit: Contact guard, HOB elevated, Used rails Sit to supine: Contact guard assist, HOB elevated, Used rails   General bed mobility comments: Increased time. Cues provided. Pt used gait belt as leg lifter    Transfers Overall transfer level: Needs assistance Equipment used: Rolling walker (2 wheels) Transfers: Sit to/from Stand Sit to Stand: Contact guard assist, From elevated surface           General transfer comment: Cues for safety, technique, hand/LE placement.    Ambulation/Gait Ambulation/Gait assistance: Contact guard assist Gait Distance (Feet): 50 Feet Assistive device: Rolling walker (2 wheels) Gait Pattern/deviations: Step-to pattern, Antalgic       General Gait Details: Cues for safety, technique, sequencing. Slow gait speed. No overt LOB. Tolerated distance well.   Stairs Stairs:  Yes Stairs assistance: Contact guard assist Stair Management: One rail Right, Forwards, Step to pattern Number of Stairs: 5 General stair comments: up and over portable stairs x 2. cues for safety, technique, sequencing. wife present to observe   Wheelchair Mobility     Tilt Bed    Modified Rankin (Stroke Patients Only)       Balance Overall balance assessment: Needs assistance         Standing balance support: Bilateral upper extremity supported, Reliant on assistive device for balance Standing balance-Leahy Scale: Fair                              Cognition Arousal: Alert Behavior During Therapy: WFL for tasks assessed/performed Overall Cognitive Status: Within Functional Limits for tasks assessed                                          Exercises     General Comments        Pertinent Vitals/Pain Pain Assessment Pain Assessment: Faces Pain Score: 5  Faces Pain Scale: Hurts even more Pain Location: R knee Pain Descriptors / Indicators: Discomfort, Aching Pain Intervention(s): Monitored during session, Repositioned    Home Living                          Prior Function            PT Goals (current goals can now be found in the care  plan section) Acute Rehab PT Goals Patient Stated Goal: regain PLOF/independence PT Goal Formulation: With patient/family Time For Goal Achievement: 09/06/23 Potential to Achieve Goals: Good Progress towards PT goals: Progressing toward goals    Frequency    7X/week      PT Plan      Co-evaluation              AM-PAC PT 6 Clicks Mobility   Outcome Measure  Help needed turning from your back to your side while in a flat bed without using bedrails?: A Little Help needed moving from lying on your back to sitting on the side of a flat bed without using bedrails?: A Little Help needed moving to and from a bed to a chair (including a wheelchair)?: A Little Help  needed standing up from a chair using your arms (e.g., wheelchair or bedside chair)?: A Little Help needed to walk in hospital room?: A Little Help needed climbing 3-5 steps with a railing? : A Little 6 Click Score: 18    End of Session Equipment Utilized During Treatment: Gait belt Activity Tolerance: Patient tolerated treatment well Patient left: in bed;with call bell/phone within reach;with family/visitor present   PT Visit Diagnosis: Pain;Other abnormalities of gait and mobility (R26.89) Pain - Right/Left: Right Pain - part of body: Knee     Time: 8665-8650 PT Time Calculation (min) (ACUTE ONLY): 15 min  Charges:    $Gait Training: 8-22 mins PT General Charges $$ ACUTE PT VISIT: 1 Visit                         Dannial SQUIBB, PT Acute Rehabilitation  Office: [phone]

## 2023-08-23 NOTE — Evaluation (Signed)
 Physical Therapy Evaluation Patient Details Name: Rodney Hayes MRN: [id number] DOB: 11-Apr-1951 Today's Date: 08/23/2023  History of Present Illness  73 yo male s/p L TKA 08/22/23. Hx of L TKA 2014, L TSA 2016, DM, neuropahty, Hep B, obesity, prostate Ca, post Covid syndrome  Clinical Impression  On eval pt required CGA-Min A for mobility. He walked ~60 feet with a RW. Moderate pain with activity. Will plan to have a 2nd session for stair training prior to potential d/c home later today. Plan is for HHPT f/u.        If plan is discharge home, recommend the following: A little help with walking and/or transfers;A little help with bathing/dressing/bathroom;Assistance with cooking/housework;Assist for transportation;Help with stairs or ramp for entrance   Can travel by private vehicle        Equipment Recommendations None recommended by PT  Recommendations for Other Services       Functional Status Assessment Patient has had a recent decline in their functional status and demonstrates the ability to make significant improvements in function in a reasonable and predictable amount of time.     Precautions / Restrictions Precautions Precautions: Fall;Knee Restrictions Weight Bearing Restrictions Per Provider Order: No RLE Weight Bearing Per Provider Order: Weight bearing as tolerated      Mobility  Bed Mobility Overal bed mobility: Needs Assistance Bed Mobility: Supine to Sit, Sit to Supine     Supine to sit: Contact guard, HOB elevated, Used rails Sit to supine: Contact guard assist, HOB elevated, Used rails   General bed mobility comments: Increased time. Cues provided. Pt used gait belt as leg lifter    Transfers Overall transfer level: Needs assistance Equipment used: Rolling walker (2 wheels) Transfers: Sit to/from Stand Sit to Stand: Min assist, From elevated surface           General transfer comment: Cues for safety, technique, hand/LE placement. Small amount  of assist to steady.    Ambulation/Gait Ambulation/Gait assistance: Contact guard assist Gait Distance (Feet): 60 Feet Assistive device: Rolling walker (2 wheels) Gait Pattern/deviations: Step-to pattern, Antalgic       General Gait Details: Cues for safety, technique, sequencing. Slow gait speed. No overt LOB-mildly unsteady intermittently. Tolerated distance well.  Stairs            Wheelchair Mobility     Tilt Bed    Modified Rankin (Stroke Patients Only)       Balance Overall balance assessment: Needs assistance         Standing balance support: Bilateral upper extremity supported, Reliant on assistive device for balance Standing balance-Leahy Scale: Poor                               Pertinent Vitals/Pain Pain Assessment Pain Assessment: 0-10 Pain Score: 5  Pain Location: R knee Pain Descriptors / Indicators: Aching Pain Intervention(s): Limited activity within patient's tolerance, Monitored during session, Ice applied, Repositioned    Home Living Family/patient expects to be discharged to:: Private residence Living Arrangements: Spouse/significant other   Type of Home: House Home Access: Stairs to enter Entrance Stairs-Rails: Lawyer of Steps: 2-3   Home Layout: One level Home Equipment: Agricultural Consultant (2 wheels);BSC/3in1;Shower seat      Prior Function               Mobility Comments: using walking stick before surgery       Extremity/Trunk Assessment   Upper  Extremity Assessment Upper Extremity Assessment: Overall WFL for tasks assessed    Lower Extremity Assessment Lower Extremity Assessment: Generalized weakness    Cervical / Trunk Assessment Cervical / Trunk Assessment: Normal  Communication   Communication Communication: Hearing impairment  Cognition Arousal: Alert Behavior During Therapy: WFL for tasks assessed/performed Overall Cognitive Status: Within Functional Limits for  tasks assessed                                          General Comments      Exercises Total Joint Exercises Ankle Circles/Pumps: AROM, Both Quad Sets: AROM, Right, 10 reps Heel Slides: AAROM, Right, 10 reps Straight Leg Raises: AAROM, Right, 10 reps Goniometric ROM: ~10-60 degrees   Assessment/Plan    PT Assessment Patient needs continued PT services  PT Problem List Decreased strength;Decreased range of motion;Decreased activity tolerance;Decreased balance;Decreased mobility;Decreased knowledge of use of DME;Obesity;Pain       PT Treatment Interventions DME instruction;Gait training;Stair training;Functional mobility training;Therapeutic exercise;Therapeutic activities;Patient/family education;Balance training    PT Goals (Current goals can be found in the Care Plan section)  Acute Rehab PT Goals Patient Stated Goal: regain PLOF/independence PT Goal Formulation: With patient/family Time For Goal Achievement: 09/06/23 Potential to Achieve Goals: Good    Frequency 7X/week     Co-evaluation               AM-PAC PT 6 Clicks Mobility  Outcome Measure Help needed turning from your back to your side while in a flat bed without using bedrails?: A Little Help needed moving from lying on your back to sitting on the side of a flat bed without using bedrails?: A Little Help needed moving to and from a bed to a chair (including a wheelchair)?: A Little Help needed standing up from a chair using your arms (e.g., wheelchair or bedside chair)?: A Little Help needed to walk in hospital room?: A Little Help needed climbing 3-5 steps with a railing? : A Little 6 Click Score: 18    End of Session Equipment Utilized During Treatment: Gait belt Activity Tolerance: Patient tolerated treatment well Patient left: in bed;with call bell/phone within reach;with bed alarm set;with family/visitor present   PT Visit Diagnosis: Pain;Other abnormalities of gait and  mobility (R26.89) Pain - Right/Left: Right Pain - part of body: Knee    Time: 1014-1050 PT Time Calculation (min) (ACUTE ONLY): 36 min   Charges:   PT Evaluation $PT Eval Low Complexity: 1 Low PT Treatments $Gait Training: 8-22 mins PT General Charges $$ ACUTE PT VISIT: 1 Visit            Dannial SQUIBB, PT Acute Rehabilitation  Office: [phone]

## 2023-08-23 NOTE — Plan of Care (Signed)
  Problem: Education: Goal: Knowledge of the prescribed therapeutic regimen will improve Outcome: Progressing   Problem: Activity: Goal: Range of joint motion will improve Outcome: Progressing   Problem: Pain Management: Goal: Pain level will decrease with appropriate interventions Outcome: Progressing   

## 2023-08-23 NOTE — TOC Transition Note (Signed)
 Transition of Care [facility]) - Discharge Note   Patient Details  Name: Rodney Hayes MRN: [id number] Date of Birth: August 16, 1950  [facility]) CM/SW Contact:  Bascom Service, RN Phone Number: 08/23/2023, 1:22 PM   Clinical Narrative:  spoke to patient's spouse about d/c plans-has rw,3n1 already. [facility] for HHPT-rep Lynette aware. Has own transport home. No further CM needs.     Final next level of care: Home w Home Health Services     Patient Goals and CMS Choice            Discharge Placement                       Discharge Plan and Services Additional resources added to the After Visit Summary for                                       Social Drivers of Health (SDOH) Interventions SDOH Screenings   Food Insecurity: No Food Insecurity (08/22/2023)  Housing: Low Risk  (08/22/2023)  Transportation Needs: No Transportation Needs (08/22/2023)  Utilities: Not At Risk (08/22/2023)  Depression (PHQ2-9): Low Risk  (05/06/2023)  Tobacco Use: Medium Risk (08/22/2023)     Readmission Risk Interventions    08/01/2021   11:01 AM  Readmission Risk Prevention Plan  Post Dischage Appt Complete  Medication Screening Complete  Transportation Screening Complete

## 2023-08-24 DIAGNOSIS — E785 Hyperlipidemia, unspecified: Secondary | ICD-10-CM | POA: Diagnosis not present

## 2023-08-24 DIAGNOSIS — M159 Polyosteoarthritis, unspecified: Secondary | ICD-10-CM | POA: Diagnosis not present

## 2023-08-24 DIAGNOSIS — I10 Essential (primary) hypertension: Secondary | ICD-10-CM | POA: Diagnosis not present

## 2023-08-24 DIAGNOSIS — L570 Actinic keratosis: Secondary | ICD-10-CM | POA: Diagnosis not present

## 2023-08-24 DIAGNOSIS — Z471 Aftercare following joint replacement surgery: Secondary | ICD-10-CM | POA: Diagnosis not present

## 2023-08-24 DIAGNOSIS — I2510 Atherosclerotic heart disease of native coronary artery without angina pectoris: Secondary | ICD-10-CM | POA: Diagnosis not present

## 2023-08-24 DIAGNOSIS — F418 Other specified anxiety disorders: Secondary | ICD-10-CM | POA: Diagnosis not present

## 2023-08-24 DIAGNOSIS — E1140 Type 2 diabetes mellitus with diabetic neuropathy, unspecified: Secondary | ICD-10-CM | POA: Diagnosis not present

## 2023-08-24 DIAGNOSIS — G4733 Obstructive sleep apnea (adult) (pediatric): Secondary | ICD-10-CM | POA: Diagnosis not present

## 2023-08-24 DIAGNOSIS — I350 Nonrheumatic aortic (valve) stenosis: Secondary | ICD-10-CM | POA: Diagnosis not present

## 2023-08-25 ENCOUNTER — Encounter (HOSPITAL_COMMUNITY): Payer: Self-pay | Admitting: Orthopaedic Surgery

## 2023-08-26 DIAGNOSIS — E785 Hyperlipidemia, unspecified: Secondary | ICD-10-CM | POA: Diagnosis not present

## 2023-08-26 DIAGNOSIS — I2510 Atherosclerotic heart disease of native coronary artery without angina pectoris: Secondary | ICD-10-CM | POA: Diagnosis not present

## 2023-08-26 DIAGNOSIS — L570 Actinic keratosis: Secondary | ICD-10-CM | POA: Diagnosis not present

## 2023-08-26 DIAGNOSIS — F418 Other specified anxiety disorders: Secondary | ICD-10-CM | POA: Diagnosis not present

## 2023-08-26 DIAGNOSIS — I350 Nonrheumatic aortic (valve) stenosis: Secondary | ICD-10-CM | POA: Diagnosis not present

## 2023-08-26 DIAGNOSIS — G4733 Obstructive sleep apnea (adult) (pediatric): Secondary | ICD-10-CM | POA: Diagnosis not present

## 2023-08-26 DIAGNOSIS — I10 Essential (primary) hypertension: Secondary | ICD-10-CM | POA: Diagnosis not present

## 2023-08-26 DIAGNOSIS — Z471 Aftercare following joint replacement surgery: Secondary | ICD-10-CM | POA: Diagnosis not present

## 2023-08-26 DIAGNOSIS — M159 Polyosteoarthritis, unspecified: Secondary | ICD-10-CM | POA: Diagnosis not present

## 2023-08-26 DIAGNOSIS — E1140 Type 2 diabetes mellitus with diabetic neuropathy, unspecified: Secondary | ICD-10-CM | POA: Diagnosis not present

## 2023-08-30 DIAGNOSIS — M159 Polyosteoarthritis, unspecified: Secondary | ICD-10-CM | POA: Diagnosis not present

## 2023-08-30 DIAGNOSIS — I10 Essential (primary) hypertension: Secondary | ICD-10-CM | POA: Diagnosis not present

## 2023-08-30 DIAGNOSIS — F418 Other specified anxiety disorders: Secondary | ICD-10-CM | POA: Diagnosis not present

## 2023-08-30 DIAGNOSIS — L570 Actinic keratosis: Secondary | ICD-10-CM | POA: Diagnosis not present

## 2023-08-30 DIAGNOSIS — I2510 Atherosclerotic heart disease of native coronary artery without angina pectoris: Secondary | ICD-10-CM | POA: Diagnosis not present

## 2023-08-30 DIAGNOSIS — G4733 Obstructive sleep apnea (adult) (pediatric): Secondary | ICD-10-CM | POA: Diagnosis not present

## 2023-08-30 DIAGNOSIS — Z471 Aftercare following joint replacement surgery: Secondary | ICD-10-CM | POA: Diagnosis not present

## 2023-08-30 DIAGNOSIS — I350 Nonrheumatic aortic (valve) stenosis: Secondary | ICD-10-CM | POA: Diagnosis not present

## 2023-08-30 DIAGNOSIS — E785 Hyperlipidemia, unspecified: Secondary | ICD-10-CM | POA: Diagnosis not present

## 2023-08-30 DIAGNOSIS — E1140 Type 2 diabetes mellitus with diabetic neuropathy, unspecified: Secondary | ICD-10-CM | POA: Diagnosis not present

## 2023-09-02 DIAGNOSIS — E1140 Type 2 diabetes mellitus with diabetic neuropathy, unspecified: Secondary | ICD-10-CM | POA: Diagnosis not present

## 2023-09-02 DIAGNOSIS — I350 Nonrheumatic aortic (valve) stenosis: Secondary | ICD-10-CM | POA: Diagnosis not present

## 2023-09-02 DIAGNOSIS — G4733 Obstructive sleep apnea (adult) (pediatric): Secondary | ICD-10-CM | POA: Diagnosis not present

## 2023-09-02 DIAGNOSIS — F418 Other specified anxiety disorders: Secondary | ICD-10-CM | POA: Diagnosis not present

## 2023-09-02 DIAGNOSIS — I10 Essential (primary) hypertension: Secondary | ICD-10-CM | POA: Diagnosis not present

## 2023-09-02 DIAGNOSIS — M159 Polyosteoarthritis, unspecified: Secondary | ICD-10-CM | POA: Diagnosis not present

## 2023-09-02 DIAGNOSIS — E785 Hyperlipidemia, unspecified: Secondary | ICD-10-CM | POA: Diagnosis not present

## 2023-09-02 DIAGNOSIS — I2510 Atherosclerotic heart disease of native coronary artery without angina pectoris: Secondary | ICD-10-CM | POA: Diagnosis not present

## 2023-09-02 DIAGNOSIS — L570 Actinic keratosis: Secondary | ICD-10-CM | POA: Diagnosis not present

## 2023-09-02 DIAGNOSIS — Z471 Aftercare following joint replacement surgery: Secondary | ICD-10-CM | POA: Diagnosis not present

## 2023-09-04 ENCOUNTER — Ambulatory Visit (INDEPENDENT_AMBULATORY_CARE_PROVIDER_SITE_OTHER): Payer: Commercial Managed Care - PPO | Admitting: Orthopaedic Surgery

## 2023-09-04 ENCOUNTER — Other Ambulatory Visit (HOSPITAL_COMMUNITY): Payer: Self-pay

## 2023-09-04 ENCOUNTER — Other Ambulatory Visit: Payer: Self-pay

## 2023-09-04 ENCOUNTER — Encounter: Payer: Self-pay | Admitting: Orthopaedic Surgery

## 2023-09-04 DIAGNOSIS — F418 Other specified anxiety disorders: Secondary | ICD-10-CM | POA: Diagnosis not present

## 2023-09-04 DIAGNOSIS — G4733 Obstructive sleep apnea (adult) (pediatric): Secondary | ICD-10-CM | POA: Diagnosis not present

## 2023-09-04 DIAGNOSIS — Z471 Aftercare following joint replacement surgery: Secondary | ICD-10-CM | POA: Diagnosis not present

## 2023-09-04 DIAGNOSIS — Z96651 Presence of right artificial knee joint: Secondary | ICD-10-CM

## 2023-09-04 DIAGNOSIS — I350 Nonrheumatic aortic (valve) stenosis: Secondary | ICD-10-CM | POA: Diagnosis not present

## 2023-09-04 DIAGNOSIS — E785 Hyperlipidemia, unspecified: Secondary | ICD-10-CM | POA: Diagnosis not present

## 2023-09-04 DIAGNOSIS — I10 Essential (primary) hypertension: Secondary | ICD-10-CM | POA: Diagnosis not present

## 2023-09-04 DIAGNOSIS — I2510 Atherosclerotic heart disease of native coronary artery without angina pectoris: Secondary | ICD-10-CM | POA: Diagnosis not present

## 2023-09-04 DIAGNOSIS — M159 Polyosteoarthritis, unspecified: Secondary | ICD-10-CM | POA: Diagnosis not present

## 2023-09-04 DIAGNOSIS — L570 Actinic keratosis: Secondary | ICD-10-CM | POA: Diagnosis not present

## 2023-09-04 DIAGNOSIS — E1140 Type 2 diabetes mellitus with diabetic neuropathy, unspecified: Secondary | ICD-10-CM | POA: Diagnosis not present

## 2023-09-04 MED ORDER — OXYCODONE HCL 5 MG PO TABS
5.0000 mg | ORAL_TABLET | Freq: Four times a day (QID) | ORAL | 0 refills | Status: DC | PRN
  Filled 2023-09-04: qty 30, 4d supply, fill #0

## 2023-09-04 NOTE — Progress Notes (Signed)
Rodney Hayes is here for his first postoperative visit status post a right total knee replacement.  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  He has been doing well.  He did come in with a walker today but has been trying a cane as well.  Home therapy has been working with him.  He has been taking a low-dose Eliquis twice daily.  He was on aspirin for this.  On exam he lacks full extension by 5 degrees but I can flex him to 90 degrees.  His calf is soft.  The knee feels stable.  His right knee incision looks good.  Staples were removed and Steri-Strips applied.  At this point we need to set him up for outpatient physical therapy to work on range of motion of his knee.  He understands the need him to push hard on his extension.  I will send in some more oxycodone for him.  Again he will stop Eliquis and go back to aspirin.  He can try Aleve starting this weekend.  We will see him back in 4 weeks to see how he is doing overall.  No x-rays to be needed at that visit.

## 2023-09-08 ENCOUNTER — Other Ambulatory Visit: Payer: Self-pay | Admitting: Internal Medicine

## 2023-09-08 ENCOUNTER — Encounter: Payer: Self-pay | Admitting: Internal Medicine

## 2023-09-08 ENCOUNTER — Other Ambulatory Visit (HOSPITAL_COMMUNITY): Payer: Self-pay

## 2023-09-08 MED ORDER — TRAMADOL HCL 50 MG PO TABS
50.0000 mg | ORAL_TABLET | Freq: Four times a day (QID) | ORAL | 3 refills | Status: AC
  Filled 2023-09-08: qty 120, 30d supply, fill #0
  Filled 2023-09-13 – 2023-10-08 (×4): qty 120, 30d supply, fill #1
  Filled 2023-12-25: qty 120, 30d supply, fill #2
  Filled 2024-02-13: qty 120, 30d supply, fill #3

## 2023-09-09 ENCOUNTER — Encounter: Payer: Self-pay | Admitting: Physical Therapy

## 2023-09-09 ENCOUNTER — Other Ambulatory Visit (HOSPITAL_COMMUNITY): Payer: Self-pay

## 2023-09-09 ENCOUNTER — Ambulatory Visit: Payer: Commercial Managed Care - PPO | Admitting: Physical Therapy

## 2023-09-09 ENCOUNTER — Other Ambulatory Visit: Payer: Self-pay | Admitting: Internal Medicine

## 2023-09-09 DIAGNOSIS — M25561 Pain in right knee: Secondary | ICD-10-CM

## 2023-09-09 DIAGNOSIS — R600 Localized edema: Secondary | ICD-10-CM

## 2023-09-09 DIAGNOSIS — R262 Difficulty in walking, not elsewhere classified: Secondary | ICD-10-CM | POA: Diagnosis not present

## 2023-09-09 DIAGNOSIS — M6281 Muscle weakness (generalized): Secondary | ICD-10-CM | POA: Diagnosis not present

## 2023-09-09 MED ORDER — GABAPENTIN 300 MG PO CAPS
300.0000 mg | ORAL_CAPSULE | Freq: Two times a day (BID) | ORAL | 3 refills | Status: DC
  Filled 2023-09-09 – 2023-09-29 (×5): qty 180, 90d supply, fill #0

## 2023-09-09 NOTE — Therapy (Signed)
 OUTPATIENT PHYSICAL THERAPY LOWER EXTREMITY EVALUATION   Patient Name: Rodney Mayabb Urbanek Jr. MRN: [id number] DOB:[birth year], 73 y.o., male Today's Date: 09/09/2023  END OF SESSION:  PT End of Session - 09/09/23 1605     Visit Number 1    Number of Visits 27    Date for PT Re-Evaluation 12/02/23    PT Start Time 1518    PT Stop Time 1602    PT Time Calculation (min) 44 min    Activity Tolerance Patient tolerated treatment well    Behavior During Therapy WFL for tasks assessed/performed             Past Medical History:  Diagnosis Date   Allergy    SEASONAL   Anxiety    Arthritis    Asthma    as a child   BPH (benign prostatic hyperplasia)    Cancer (HCC)    prostatectomy 2018 Gleason 7   Cataract    BILATERAL   Coronary artery disease    Diabetes mellitus    average fasting 140s type 2   DJD (degenerative joint disease)    Heart murmur    dr. Gala Romney   Hepatitis    hep B   Hyperlipidemia    Hypertension    Neuromuscular disorder (HCC)    sciatica LLE   Obesity    Pneumonia    Sleep apnea    C-PAP   Past Surgical History:  Procedure Laterality Date   Biceps tendon reinsertion     colonoscopy with polypectomy     adenomatous; Dr Leone Payor   JOINT REPLACEMENT Left    knee   left knee arthroscopy     LYMPHADENECTOMY Bilateral 01/23/2017   Procedure: BILATERAL LYMPHADENECTOMY;  Surgeon: Heloise Purpura, MD;  Location: [facility];  Service: Urology;  Laterality: Bilateral;   PROSTATECTOMY     01/23/17 Dr. Laverle Patter   ROBOT ASSISTED LAPAROSCOPIC RADICAL PROSTATECTOMY N/A 01/23/2017   Procedure: XI ROBOTIC ASSISTED LAPAROSCOPIC RADICAL PROSTATECTOMY LEVEL 3;  Surgeon: Heloise Purpura, MD;  Location: [facility];  Service: Urology;  Laterality: N/A;   TONSILLECTOMY AND ADENOIDECTOMY     TOTAL KNEE ARTHROPLASTY Left 01/11/2013   Procedure: LEFT TOTAL KNEE ARTHROPLASTY;  Surgeon: Loanne Drilling, MD;  Location: [facility];  Service: Orthopedics;  Laterality: Left;   TOTAL  KNEE ARTHROPLASTY Right 08/22/2023   Procedure: RIGHT TOTAL KNEE ARTHROPLASTY;  Surgeon: Kathryne Hitch, MD;  Location: [facility];  Service: Orthopedics;  Laterality: Right;   TOTAL SHOULDER ARTHROPLASTY Left 08/12/2014   Procedure: LEFT TOTAL SHOULDER ARTHROPLASTY;  Surgeon: Verlee Rossetti, MD;  Location: [facility] OR;  Service: Orthopedics;  Laterality: Left;   UMBILICAL HERNIA REPAIR     Patient Active Problem List   Diagnosis Date Noted   Status post total right knee replacement 08/22/2023   Situational anxiety 08/27/2022   Post-COVID syndrome 08/13/2021   Stress at home 08/13/2021   COVID-19 07/31/2021   Diabetic neuropathy (HCC) 06/27/2021   Biochemically recurrent malignant neoplasm of prostate (HCC) 04/06/2019   Dyslipidemia 08/25/2018   Well adult exam 08/25/2018   Actinic keratoses 08/25/2018   Prostate cancer (HCC) 01/23/2017   Diabetes mellitus (HCC) 10/04/2016   Aortic stenosis 08/19/2016   Coronary artery disease 08/04/2015   Obesity 08/04/2015   Osteoarthritis of multiple joints 08/04/2015   S/P TKR (total knee replacement) 08/04/2015   S/P shoulder replacement 08/12/2014   Arthritis of shoulder region, left, degenerative 03/22/2014   History of colonic polyps 05/30/2010   Obstructive sleep  apnea 05/10/2009   Essential hypertension 05/10/2009   History of hepatitis B virus infection 05/10/2009    PCP: Plotnikov, Georgina Quint, MD   REFERRING PROVIDER: Kathryne Hitch*   REFERRING DIAG:  Diagnosis  [phone] (ICD-10-CM) - Status post total right knee replacement    THERAPY DIAG:  Acute pain of right knee  Difficulty in walking, not elsewhere classified  Localized edema  Muscle weakness (generalized)  Rationale for Evaluation and Treatment: Rehabilitation  ONSET DATE: 08/22/23: Elinor Parkinson TKA  SUBJECTIVE:   SUBJECTIVE STATEMENT: 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 He has been doing well.  Pt reporting he has been working with HHPT until they discharged him. He has been taking a low-dose Eliquis twice daily. He was on aspirin for this.   PERTINENT HISTORY: Rt TKA on 08/22/23, h/o left TKA 2014, biceps tenon reinsertion  See further PMH above  PAIN:  NPRS scale: 3/10 today upon arrival, worse pain 7-8/10 Pain location: Rt knee Pain description: achy Aggravating factors: sleeping,  Relieving factors: pain meds  PRECAUTIONS: None  WEIGHT BEARING RESTRICTIONS: No  FALLS:  Has patient fallen in last 6 months? 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 No periprosthetic lucency or fracture. There has been patellar resurfacing. Recent postsurgical change includes air and edema in the soft tissues and joint space. Anterior skin staples.   IMPRESSION: Right knee arthroplasty without immediate postoperative complication.  PATIENT SURVEYS:   Patient-specific activity scoring scheme   "0" represents "unable to perform." "10" represents "able to perform at prior level. 0 1 2 3 4 5 6 7 8 9  10 (Date and Score) Activity Initial  Activity 09/09/23    walk 4     2. stairs 4     3. drive 3   4.sleeping 2        TOTAL SCORE   13/4 3.25    Total score = sum of the activity scores/number of activities Minimum detectable change (90%CI) for average score = 2 points Minimum detectable change (90%CI) for single activity score = 3 points   COGNITION: Overall cognitive status: [facility]     LOWER EXTREMITY ROM:    ROM A: active, P: passive Right 09/09/23 Left 09/09/23  Hip flexion 108 110  Knee flexion A: 95 P:  100 A: 108  Knee extension A: -17 A: -2    (Blank rows = not tested)  LOWER EXTREMITY MMT:  MMT Right 09/09/23 Left 09/09/23  Hip flexion 4 5  Hip extension    Hip abduction    Hip adduction    Knee flexion 4 5  Knee extension 4 5  Ankle dorsiflexion    Ankle plantarflexion     (Blank rows = not tested)    FUNCTIONAL TESTS:  09/09/23: 5 time sit to stand: 15.25 seconds UE support  GAIT: Distance walked: clinic distances Assistive device utilized: Single point cane Level of assistance: Modified independence Comments: antalgic gait, decreased knee flexion and hip flexion, decreased knee extension in stance phase  TODAY'S TREATMENT                                                                          DATE: 09/09/23 Therex: HEP instruction/performance c cues for techniques, handout provided.  Trial set performed of each for comprehension and symptom assessment.  See below for exercise list  PATIENT EDUCATION:  Education details: HEP, POC Person educated: Patient Education method: Explanation, Demonstration, Verbal cues, and Handouts Education comprehension: verbalized understanding, returned demonstration, and verbal cues required  HOME EXERCISE PROGRAM: Access Code: [email or web address] URL: [email or web address] Date: 09/09/2023 Prepared by: Narda Amber  Exercises - Seated Hamstring Stretch  - 3 x daily - 7 x weekly - 3 reps - 30 seconds hold - Seated Long Arc Quad  - 3 x daily - 7 x weekly - 2 sets - 10 reps - 3 seconds hold - Sit to Stand with Counter Support  - 3 x daily - 7 x weekly - 2 sets - 10 reps - Seated Quad Set  - 3 x daily - 7 x weekly - 10 reps - 5-10 seconds with overpressure  hold - Supine Heel Slide with Strap  - 3 x daily - 7 x weekly - 10 reps - 5 seconds hold  ASSESSMENT:  CLINICAL IMPRESSION: Patient is a 73 y.o. who comes to clinic with complaints of Rt knee pain s/p Rt TKA on 08/22/23. Pt presenting with mobility, strength and movement coordination deficits that impair their ability to perform usual daily and recreational functional activities without increase difficulty/symptoms at this time.  Patient to benefit from skilled PT services to address impairments and limitations to improve to previous level of function without restriction secondary to condition.   OBJECTIVE IMPAIRMENTS: Abnormal gait, decreased balance, decreased mobility, difficulty walking, decreased ROM, decreased strength, increased edema, impaired flexibility, and pain.   ACTIVITY LIMITATIONS: lifting, bending, sitting, standing, squatting, sleeping, stairs, and transfers  PARTICIPATION LIMITATIONS: driving, community activity, occupation, and yard work  PERSONAL FACTORS: 3+ comorbidities: see PMH above  are also affecting patient's functional outcome.   REHAB POTENTIAL: Good  CLINICAL DECISION MAKING: Stable/uncomplicated  EVALUATION COMPLEXITY: Low   GOALS: Goals reviewed with patient? Yes  SHORT TERM GOALS: (target date for Short term goals are 3 weeks 09/30/2023)   1.  Patient will demonstrate independent use of home exercise program to maintain progress from in clinic treatments.  Goal status: New  LONG TERM GOALS: (target dates for all long term goals are 12 weeks  12/02/2023 )   1. Patient will demonstrate/report pain at worst less than or equal to 2/10 to facilitate minimal limitation in daily activity secondary to pain symptoms.  Goal status: New   2. Patient will demonstrate independent use of home exercise program to facilitate ability to maintain/progress functional gains from skilled physical therapy services.  Goal status: New   3. Patient will demonstrate  Patient specific functional scale avg > or = 6.25 to indicate reduced disability due to condition.   Goal status: New   4.  Patient will demonstrate Rt  LE MMT 5/5 throughout to faciltiate usual transfers, stairs, squatting at [facility] for daily life.   Goal status: New   5.  Patient will demonstrate active ROM arc of  4 - 115 degrees in his Rt knee to improve functional mobility and gait.  Goal status: New   6.  Pt will be able to navigate up and down 1 flight of stairs with reciprocal gait pattern with single hand rail.  Goal status: New    PLAN:  PT FREQUENCY: 2-3x/week  PT DURATION: 12 weeks  PLANNED INTERVENTIONS: Can include 16109- PT Re-evaluation, 97110-Therapeutic exercises, 97530- Therapeutic activity, 97112- Neuromuscular re-education, 97535- Self Care, 97140- Manual therapy, [phone]- Gait training, [phone]- Orthotic Fit/training, [phone]- Canalith repositioning, [id number]- Aquatic Therapy, [phone]- Electrical stimulation (unattended), [phone]- Electrical stimulation (manual), [id number] Physical performance testing, 97016- Vasopneumatic device, [id number]- Ultrasound, [id number]- Traction (mechanical), [id number]- Ionotophoresis 4mg /ml Dexamethasone, Patient/Family education, Balance training, Stair training, Taping, Dry Needling, Joint mobilization, Joint manipulation, Spinal manipulation, Spinal mobilization, Scar mobilization, Vestibular training, Visual/preceptual remediation/compensation, DME instructions, Cryotherapy, and Moist heat.  All performed as medically necessary.  All included unless contraindicated  PLAN FOR NEXT SESSION: Review HEP knowledge/results. Nustep vs recumbent bike,  Rt knee ROM, strengthening, functional mobility, gait vasopneumatic     Sharmon Leyden, PT, MPT 09/09/2023, 4:09 PM

## 2023-09-10 ENCOUNTER — Other Ambulatory Visit (HOSPITAL_COMMUNITY): Payer: Self-pay

## 2023-09-10 ENCOUNTER — Other Ambulatory Visit: Payer: Self-pay | Admitting: Internal Medicine

## 2023-09-11 ENCOUNTER — Other Ambulatory Visit: Payer: Self-pay

## 2023-09-11 ENCOUNTER — Other Ambulatory Visit (HOSPITAL_COMMUNITY): Payer: Self-pay

## 2023-09-11 MED ORDER — INSULIN GLARGINE-YFGN 100 UNIT/ML ~~LOC~~ SOPN
20.0000 [IU] | PEN_INJECTOR | Freq: Two times a day (BID) | SUBCUTANEOUS | 2 refills | Status: DC
  Filled 2023-09-11: qty 15, 38d supply, fill #0
  Filled 2023-10-17: qty 15, 38d supply, fill #1
  Filled 2023-12-11: qty 15, 38d supply, fill #2

## 2023-09-12 ENCOUNTER — Encounter: Payer: Self-pay | Admitting: Rehabilitative and Restorative Service Providers"

## 2023-09-12 ENCOUNTER — Ambulatory Visit: Payer: Commercial Managed Care - PPO | Admitting: Rehabilitative and Restorative Service Providers"

## 2023-09-12 DIAGNOSIS — M6281 Muscle weakness (generalized): Secondary | ICD-10-CM

## 2023-09-12 DIAGNOSIS — R600 Localized edema: Secondary | ICD-10-CM

## 2023-09-12 DIAGNOSIS — R262 Difficulty in walking, not elsewhere classified: Secondary | ICD-10-CM | POA: Diagnosis not present

## 2023-09-12 DIAGNOSIS — M25561 Pain in right knee: Secondary | ICD-10-CM

## 2023-09-12 NOTE — Therapy (Signed)
 OUTPATIENT PHYSICAL THERAPY TREATMENT NOTE   Patient Name: Rodney Kindred Schnabel Jr. MRN: [id number] DOB:08-Nov-1950, 73 y.o., male Today's Date: 09/12/2023  END OF SESSION:  PT End of Session - 09/12/23 1104     Visit Number 2    Number of Visits 27    Date for PT Re-Evaluation 12/02/23    PT Start Time 1101    PT Stop Time 1152    PT Time Calculation (min) 51 min    Activity Tolerance Patient tolerated treatment well    Behavior During Therapy WFL for tasks assessed/performed             Past Medical History:  Diagnosis Date   Allergy    SEASONAL   Anxiety    Arthritis    Asthma    as a child   BPH (benign prostatic hyperplasia)    Cancer (HCC)    prostatectomy 2018 Gleason 7   Cataract    BILATERAL   Coronary artery disease    Diabetes mellitus    average fasting 140s type 2   DJD (degenerative joint disease)    Heart murmur    dr. Gala Romney   Hepatitis    hep B   Hyperlipidemia    Hypertension    Neuromuscular disorder (HCC)    sciatica LLE   Obesity    Pneumonia    Sleep apnea    C-PAP   Past Surgical History:  Procedure Laterality Date   Biceps tendon reinsertion     colonoscopy with polypectomy     adenomatous; Dr Leone Payor   JOINT REPLACEMENT Left    knee   left knee arthroscopy     LYMPHADENECTOMY Bilateral 01/23/2017   Procedure: BILATERAL LYMPHADENECTOMY;  Surgeon: Heloise Purpura, MD;  Location: [facility];  Service: Urology;  Laterality: Bilateral;   PROSTATECTOMY     01/23/17 Dr. Laverle Patter   ROBOT ASSISTED LAPAROSCOPIC RADICAL PROSTATECTOMY N/A 01/23/2017   Procedure: XI ROBOTIC ASSISTED LAPAROSCOPIC RADICAL PROSTATECTOMY LEVEL 3;  Surgeon: Heloise Purpura, MD;  Location: [facility];  Service: Urology;  Laterality: N/A;   TONSILLECTOMY AND ADENOIDECTOMY     TOTAL KNEE ARTHROPLASTY Left 01/11/2013   Procedure: LEFT TOTAL KNEE ARTHROPLASTY;  Surgeon: Loanne Drilling, MD;  Location: [facility];  Service: Orthopedics;  Laterality: Left;   TOTAL KNEE  ARTHROPLASTY Right 08/22/2023   Procedure: RIGHT TOTAL KNEE ARTHROPLASTY;  Surgeon: Kathryne Hitch, MD;  Location: [facility];  Service: Orthopedics;  Laterality: Right;   TOTAL SHOULDER ARTHROPLASTY Left 08/12/2014   Procedure: LEFT TOTAL SHOULDER ARTHROPLASTY;  Surgeon: Verlee Rossetti, MD;  Location: [facility] OR;  Service: Orthopedics;  Laterality: Left;   UMBILICAL HERNIA REPAIR     Patient Active Problem List   Diagnosis Date Noted   Status post total right knee replacement 08/22/2023   Situational anxiety 08/27/2022   Post-COVID syndrome 08/13/2021   Stress at home 08/13/2021   COVID-19 07/31/2021   Diabetic neuropathy (HCC) 06/27/2021   Biochemically recurrent malignant neoplasm of prostate (HCC) 04/06/2019   Dyslipidemia 08/25/2018   Well adult exam 08/25/2018   Actinic keratoses 08/25/2018   Prostate cancer (HCC) 01/23/2017   Diabetes mellitus (HCC) 10/04/2016   Aortic stenosis 08/19/2016   Coronary artery disease 08/04/2015   Obesity 08/04/2015   Osteoarthritis of multiple joints 08/04/2015   S/P TKR (total knee replacement) 08/04/2015   S/P shoulder replacement 08/12/2014   Arthritis of shoulder region, left, degenerative 03/22/2014   History of colonic polyps 05/30/2010   Obstructive sleep apnea  05/10/2009   Essential hypertension 05/10/2009   History of hepatitis B virus infection 05/10/2009    PCP: Plotnikov, Georgina Quint, MD   REFERRING PROVIDER: Kathryne Hitch*   REFERRING DIAG:  Diagnosis  [phone] (ICD-10-CM) - Status post total right knee replacement    THERAPY DIAG:  Acute pain of right knee  Difficulty in walking, not elsewhere classified  Localized edema  Muscle weakness (generalized)  Rationale for Evaluation and Treatment: Rehabilitation  ONSET DATE: 08/22/23: Rt TKA  SUBJECTIVE:   SUBJECTIVE STATEMENT: States he is doing fine this morning. Arrives with walking stick however states that he is only using it for balance and was able to  walk without it. Patient eager to return to going to gym and using exercise bike.   Wife present during session.  PERTINENT HISTORY: Rt TKA on 08/22/23, h/o left TKA 2014, biceps tenon reinsertion  See further PMH above  PAIN:  NPRS scale: 3/10 today upon arrival, worse pain 7-8/10 Pain location: Rt knee Pain description: achy Aggravating factors: sleeping,  Relieving factors: pain meds  PRECAUTIONS: None  WEIGHT BEARING RESTRICTIONS: No  FALLS:  Has patient fallen in last 6 months? 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 No periprosthetic lucency or fracture. There has been patellar resurfacing. Recent postsurgical change includes air and edema in the soft tissues and joint space. Anterior skin staples.   IMPRESSION: Right knee arthroplasty without immediate postoperative complication.  PATIENT SURVEYS:   Patient-specific activity scoring scheme   "0" represents "unable to perform." "10" represents "able to perform at prior level. 0 1 2 3 4 5 6 7 8 9  10 (Date and Score) Activity Initial  Activity 09/09/23    walk 4     2. stairs 4     3. drive 3   4.sleeping 2        TOTAL SCORE   13/4 3.25    Total score = sum of the activity scores/number of activities Minimum detectable change (90%CI) for average score = 2 points Minimum detectable change (90%CI) for single activity score = 3 points   COGNITION: Overall cognitive status: [facility]     LOWER EXTREMITY ROM:   ROM A: active, P: passive  Right 09/09/23 Left 09/09/23  Hip flexion 108 110  Knee flexion A: 95 P:  100 A: 108  Knee extension A: -17 A: -2    (Blank rows = not tested)  LOWER EXTREMITY MMT:  MMT Right 09/09/23 Left 09/09/23  Hip flexion 4 5  Hip extension    Hip abduction    Hip adduction    Knee flexion 4 5  Knee extension 4 5  Ankle dorsiflexion    Ankle plantarflexion     (Blank rows = not tested)    FUNCTIONAL TESTS:  09/09/23: 5 time sit to stand: 15.25 seconds UE support  GAIT: Distance walked: clinic distances Assistive device utilized: Single point cane Level of assistance: Modified independence Comments: antalgic gait, decreased knee flexion and hip flexion, decreased knee extension in stance phase  TODAY'S TREATMENT                                                                          DATE: 09/09/23 Therex: Nustep for warm up Gastroc stretch 3x30sec Leg press 87lb 2x10 then single leg 37lb 2x10 //bar knee flexion on 6in step x10 with 10sec hold; vc for proper form to increase knee flexion  Theract: 6in Step ups anterior 2x10; 1st bout with railing 2nd bout UE hover; standing rest between sets 6in Step ups lateral 2x10; 1st bout BUE on railing 2nd bout 1UE on railing; standing rest between sets  Neuro: Tandem balance 2x30sec ea Backward stepping x5lengths 1UE on rail   Vaso: , 34deg, R leg, BLE elevated, mod pressure  TREATMENT                                                                          DATE: 09/09/23 Therex: HEP instruction/performance c cues for techniques, handout provided.  Trial set performed of each for comprehension and symptom assessment.  See below for exercise list  PATIENT EDUCATION:  Education details: HEP, POC Person educated: Patient Education method: Explanation,  Demonstration, Verbal cues, and Handouts Education comprehension: verbalized understanding, returned demonstration, and verbal cues required  HOME EXERCISE PROGRAM: Access Code: [email or web address] URL: [email or web address] Date: 09/09/2023 Prepared by: Narda Amber  Exercises - Seated Hamstring Stretch  - 3 x daily - 7 x weekly - 3 reps - 30 seconds hold - Seated Long Arc Quad  - 3 x daily - 7 x weekly - 2 sets - 10 reps - 3 seconds hold - Sit to Stand with Counter Support  - 3 x daily - 7 x weekly - 2 sets - 10 reps - Seated Quad Set  - 3 x daily - 7 x weekly - 10 reps - 5-10 seconds with overpressure hold - Supine Heel Slide with Strap  - 3 x daily - 7 x weekly - 10 reps - 5 seconds hold  ASSESSMENT:  CLINICAL IMPRESSION: Patient did well and tolerated increased activity throughout the session however did have early fatigue which will improve with increased activity throughout recovery. Demonstrates balance deficits as seen during balance tasks and with gait. Will benefit from skilled physical therapy to address strength, ROM, and activity tolerance deficits.   OBJECTIVE IMPAIRMENTS: Abnormal gait, decreased balance, decreased mobility, difficulty walking, decreased ROM, decreased strength, increased edema, impaired flexibility, and pain.   ACTIVITY LIMITATIONS: lifting, bending, sitting, standing, squatting, sleeping, stairs, and transfers  PARTICIPATION LIMITATIONS: driving, community activity, occupation, and yard work  PERSONAL FACTORS: 3+ comorbidities: see PMH above  are also affecting patient's functional outcome.   REHAB POTENTIAL: Good  CLINICAL DECISION MAKING: Stable/uncomplicated  EVALUATION COMPLEXITY: Low   GOALS: Goals reviewed with patient? Yes  SHORT TERM GOALS: (target date for Short term goals are 3 weeks 09/30/2023)   1.  Patient will demonstrate independent use of home exercise program to maintain progress from  in clinic treatments.  Goal  status: New  LONG TERM GOALS: (target dates for all long term goals are 12 weeks  12/02/2023 )   1. Patient will demonstrate/report pain at worst less than or equal to 2/10 to facilitate minimal limitation in daily activity secondary to pain symptoms.  Goal status: New   2. Patient will demonstrate independent use of home exercise program to facilitate ability to maintain/progress functional gains from skilled physical therapy services.  Goal status: New   3. Patient will demonstrate Patient specific functional scale avg > or = 6.25 to indicate reduced disability due to condition.   Goal status: New   4.  Patient will demonstrate Rt  LE MMT 5/5 throughout to faciltiate usual transfers, stairs, squatting at [facility] for daily life.   Goal status: New   5.  Patient will demonstrate active ROM arc of 4 - 115 degrees in his Rt knee to improve functional mobility and gait.  Goal status: New   6.  Pt will be able to navigate up and down 1 flight of stairs with reciprocal gait pattern with single hand rail.  Goal status: New    PLAN:  PT FREQUENCY: 2-3x/week  PT DURATION: 12 weeks  PLANNED INTERVENTIONS: Can include 82956- PT Re-evaluation, 97110-Therapeutic exercises, 97530- Therapeutic activity, 97112- Neuromuscular re-education, 97535- Self Care, 97140- Manual therapy, [phone]- Gait training, [phone]- Orthotic Fit/training, [phone]- Canalith repositioning, [id number]- Aquatic Therapy, [phone]- Electrical stimulation (unattended), [phone]- Electrical stimulation (manual), [id number] Physical performance testing, 97016- Vasopneumatic device, [id number]- Ultrasound, [id number]- Traction (mechanical), [id number]- Ionotophoresis 4mg /ml Dexamethasone, Patient/Family education, Balance training, Stair training, Taping, Dry Needling, Joint mobilization, Joint manipulation, Spinal manipulation, Spinal mobilization, Scar mobilization, Vestibular training, Visual/preceptual remediation/compensation, DME instructions, Cryotherapy, and  Moist heat.  All performed as medically necessary.  All included unless contraindicated  PLAN FOR NEXT SESSION: Recumbent bike, continue LE strengthening and ROM activities   Stephanine Reas, Peni Rupard, Student-PT 09/12/2023, 11:53 AM

## 2023-09-13 ENCOUNTER — Other Ambulatory Visit: Payer: Self-pay | Admitting: Internal Medicine

## 2023-09-15 ENCOUNTER — Encounter: Payer: Self-pay | Admitting: Rehabilitative and Restorative Service Providers"

## 2023-09-15 ENCOUNTER — Other Ambulatory Visit (HOSPITAL_COMMUNITY): Payer: Self-pay

## 2023-09-15 ENCOUNTER — Other Ambulatory Visit: Payer: Self-pay

## 2023-09-15 ENCOUNTER — Ambulatory Visit: Payer: Commercial Managed Care - PPO | Admitting: Rehabilitative and Restorative Service Providers"

## 2023-09-15 DIAGNOSIS — M6281 Muscle weakness (generalized): Secondary | ICD-10-CM

## 2023-09-15 DIAGNOSIS — R600 Localized edema: Secondary | ICD-10-CM | POA: Diagnosis not present

## 2023-09-15 DIAGNOSIS — M25561 Pain in right knee: Secondary | ICD-10-CM | POA: Diagnosis not present

## 2023-09-15 DIAGNOSIS — R262 Difficulty in walking, not elsewhere classified: Secondary | ICD-10-CM

## 2023-09-15 MED ORDER — HYDROCHLOROTHIAZIDE 25 MG PO TABS
25.0000 mg | ORAL_TABLET | Freq: Every day | ORAL | 2 refills | Status: DC
  Filled 2023-09-15: qty 90, 90d supply, fill #0
  Filled 2023-10-17 – 2023-12-11 (×2): qty 90, 90d supply, fill #1
  Filled 2024-03-11: qty 90, 90d supply, fill #2

## 2023-09-15 MED ORDER — DEXCOM G7 SENSOR MISC
11 refills | Status: AC
  Filled 2023-09-15: qty 3, 30d supply, fill #0
  Filled 2023-10-17: qty 3, 30d supply, fill #1
  Filled 2023-12-11: qty 3, 30d supply, fill #2
  Filled 2024-01-09: qty 3, 30d supply, fill #3
  Filled 2024-02-27: qty 3, 30d supply, fill #4
  Filled 2024-04-03: qty 3, 30d supply, fill #5
  Filled 2024-04-07: qty 3, 30d supply, fill #0
  Filled 2024-05-03: qty 3, 30d supply, fill #1
  Filled 2024-06-01: qty 3, 30d supply, fill #2
  Filled 2024-07-01: qty 3, 30d supply, fill #3
  Filled 2024-08-09: qty 3, 30d supply, fill #4

## 2023-09-15 NOTE — Therapy (Signed)
 OUTPATIENT PHYSICAL THERAPY TREATMENT NOTE   Patient Name: Rodney Anderle Warf Jr. MRN: [id number] DOB:1951/05/25, 73 y.o., male Today's Date: 09/15/2023  END OF SESSION:  PT End of Session - 09/15/23 0808     Visit Number 3    Number of Visits 27    Date for PT Re-Evaluation 12/02/23    PT Start Time 0758    PT Stop Time 0848    PT Time Calculation (min) 50 min    Activity Tolerance Patient tolerated treatment well    Behavior During Therapy [facility] for tasks assessed/performed              Past Medical History:  Diagnosis Date   Allergy    SEASONAL   Anxiety    Arthritis    Asthma    as a child   BPH (benign prostatic hyperplasia)    Cancer (HCC)    prostatectomy 2018 Gleason 7   Cataract    BILATERAL   Coronary artery disease    Diabetes mellitus    average fasting 140s type 2   DJD (degenerative joint disease)    Heart murmur    dr. Gala Romney   Hepatitis    hep B   Hyperlipidemia    Hypertension    Neuromuscular disorder (HCC)    sciatica LLE   Obesity    Pneumonia    Sleep apnea    C-PAP   Past Surgical History:  Procedure Laterality Date   Biceps tendon reinsertion     colonoscopy with polypectomy     adenomatous; Dr Leone Payor   JOINT REPLACEMENT Left    knee   left knee arthroscopy     LYMPHADENECTOMY Bilateral 01/23/2017   Procedure: BILATERAL LYMPHADENECTOMY;  Surgeon: Heloise Purpura, MD;  Location: [facility];  Service: Urology;  Laterality: Bilateral;   PROSTATECTOMY     01/23/17 Dr. Laverle Patter   ROBOT ASSISTED LAPAROSCOPIC RADICAL PROSTATECTOMY N/A 01/23/2017   Procedure: XI ROBOTIC ASSISTED LAPAROSCOPIC RADICAL PROSTATECTOMY LEVEL 3;  Surgeon: Heloise Purpura, MD;  Location: [facility];  Service: Urology;  Laterality: N/A;   TONSILLECTOMY AND ADENOIDECTOMY     TOTAL KNEE ARTHROPLASTY Left 01/11/2013   Procedure: LEFT TOTAL KNEE ARTHROPLASTY;  Surgeon: Loanne Drilling, MD;  Location: [facility];  Service: Orthopedics;  Laterality: Left;   TOTAL KNEE  ARTHROPLASTY Right 08/22/2023   Procedure: RIGHT TOTAL KNEE ARTHROPLASTY;  Surgeon: Kathryne Hitch, MD;  Location: [facility];  Service: Orthopedics;  Laterality: Right;   TOTAL SHOULDER ARTHROPLASTY Left 08/12/2014   Procedure: LEFT TOTAL SHOULDER ARTHROPLASTY;  Surgeon: Verlee Rossetti, MD;  Location: [facility];  Service: Orthopedics;  Laterality: Left;   UMBILICAL HERNIA REPAIR     Patient Active Problem List   Diagnosis Date Noted   Status post total right knee replacement 08/22/2023   Situational anxiety 08/27/2022   Post-COVID syndrome 08/13/2021   Stress at home 08/13/2021   COVID-19 07/31/2021   Diabetic neuropathy (HCC) 06/27/2021   Biochemically recurrent malignant neoplasm of prostate (HCC) 04/06/2019   Dyslipidemia 08/25/2018   Well adult exam 08/25/2018   Actinic keratoses 08/25/2018   Prostate cancer (HCC) 01/23/2017   Diabetes mellitus (HCC) 10/04/2016   Aortic stenosis 08/19/2016   Coronary artery disease 08/04/2015   Obesity 08/04/2015   Osteoarthritis of multiple joints 08/04/2015   S/P TKR (total knee replacement) 08/04/2015   S/P shoulder replacement 08/12/2014   Arthritis of shoulder region, left, degenerative 03/22/2014   History of colonic polyps 05/30/2010   Obstructive sleep  apnea 05/10/2009   Essential hypertension 05/10/2009   History of hepatitis B virus infection 05/10/2009    PCP: Plotnikov, Georgina Quint, MD   REFERRING PROVIDER: Kathryne Hitch*   REFERRING DIAG:  Diagnosis  [phone] (ICD-10-CM) - Status post total right knee replacement    THERAPY DIAG:  Acute pain of right knee  Difficulty in walking, not elsewhere classified  Localized edema  Muscle weakness (generalized)  Rationale for Evaluation and Treatment: Rehabilitation  ONSET DATE: 08/22/23: Rt TKA  SUBJECTIVE:   SUBJECTIVE STATEMENT: Pt indicated 1-2/10 pain upon arrival today.  Reported feeling tight this morning, hasn't had time to move much.  Pt indicated  sleeping for 3-4 hours prior to waking.   PERTINENT HISTORY: Rt TKA on 08/22/23, h/o left TKA 2014, biceps tenon reinsertion  See further PMH above  PAIN:  NPRS scale: 1-2/10 Pain location: Rt knee Pain description: achy Aggravating factors: sleeping,  Relieving factors: pain meds  PRECAUTIONS: None  WEIGHT BEARING RESTRICTIONS: No  FALLS:  Has patient fallen in last 6 months? 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 No periprosthetic lucency or fracture. There has been patellar resurfacing. Recent postsurgical change includes air and edema in the soft tissues and joint space. Anterior skin staples.   IMPRESSION: Right knee arthroplasty without immediate postoperative complication.  PATIENT SURVEYS:   Patient-specific activity scoring scheme   "0" represents "unable to perform." "10" represents "able to perform at prior level. 0 1 2 3 4 5 6 7 8 9  10 (Date and Score) Activity Initial  Activity 09/09/23    walk 4     2. stairs 4     3. drive 3   4.sleeping 2        TOTAL SCORE   13/4 3.25    Total score = sum of the activity scores/number of activities Minimum detectable change (90%CI) for average score = 2 points Minimum detectable change (90%CI) for single activity score = 3 points   COGNITION: Overall cognitive status: [facility]     LOWER EXTREMITY ROM:   ROM A: active, P: passive Right 09/09/23 Left 09/09/23 Right 08/19/2023  Hip flexion 108 110   Knee flexion A: 95 P:  100 A: 108 AROM supine  heel slide 103  Knee extension A: -17 A: -2     (Blank rows = not tested)  LOWER EXTREMITY MMT:  MMT Right 09/09/23 Left 09/09/23  Hip flexion 4 5  Hip extension    Hip abduction    Hip adduction    Knee flexion 4 5  Knee extension 4 5  Ankle dorsiflexion    Ankle plantarflexion     (Blank rows = not tested)    FUNCTIONAL TESTS:  09/09/23: 5 time sit to stand: 15.25 seconds UE support  GAIT: Eval: Distance walked: clinic distances Assistive device utilized: Single point cane Level of assistance: Modified independence Comments: antalgic gait, decreased knee flexion and hip flexion, decreased knee extension in stance phase  TODAY'S TREATMENT                                                                          DATE: 09/15/2023 Therex: Recumbent bike partial circles to full circles 8 mins, seat 9  Gastroc stretch incline 3x30sec Seated Rt leg LAQ 3 lb weight with end range pause 1-2 sec each way 2 x 15 Seated quad set with SLR Rt leg 2 x 10 slow lowering focus  Seated quad set 5 sec hold x 10  Supine Rt leg LAQ x 10 in 90 deg hip flexion  Supine Rt heel slide AROM 5 sec hold x 0   Theract:(to improve progressive mobility, walking, stairs, squatting, transfers).  Leg press 87lb x15,  then single leg 37lb 2x15 Seated reactive blazepod green/red light driving simulation pressure into bosuball 30 sec x 5 with 15 sec rest breaks.  Timeout 2 sec of lights   Vaso:  , 34deg, Rt knee medium pres  TODAY'S TREATMENT                                                                          DATE: 09/09/23 Therex: Nustep for warm up Gastroc stretch 3x30sec Leg press 87lb 2x10 then single leg 37lb 2x10 //bar knee flexion on 6in step x10 with 10sec hold; vc for proper form to increase knee  flexion  Theract: 6in Step ups anterior 2x10; 1st bout with railing 2nd bout UE hover; standing rest between sets 6in Step ups lateral 2x10; 1st bout BUE on railing 2nd bout 1UE on railing; standing rest between sets  Neuro: Tandem balance 2x30sec ea Backward stepping x5lengths 1UE on rail   Vaso: , 34deg, R leg, BLE elevated, mod pressure  TREATMENT                                                                          DATE: 09/09/23 Therex: HEP instruction/performance c cues for techniques, handout provided.  Trial set performed of each for comprehension and symptom assessment.  See below for exercise list  PATIENT EDUCATION:  Education details: HEP, POC Person educated: Patient Education method: Explanation, Demonstration, Verbal cues, and Handouts Education comprehension: verbalized understanding, returned demonstration, and verbal cues required  HOME EXERCISE PROGRAM: Access Code: [email or web address] URL: [email or web address] Date: 09/09/2023 Prepared by: Narda Amber  Exercises - Seated Hamstring Stretch  - 3 x daily - 7 x weekly - 3 reps - 30 seconds hold - Seated Long Arc Quad  - 3 x daily - 7 x weekly - 2 sets - 10 reps - 3 seconds hold - Sit to Stand with Counter Support  - 3 x daily - 7 x weekly - 2  sets - 10 reps - Seated Quad Set  - 3 x daily - 7 x weekly - 10 reps - 5-10 seconds with overpressure hold - Supine Heel Slide with Strap  - 3 x daily - 7 x weekly - 10 reps - 5 seconds hold  ASSESSMENT:  CLINICAL IMPRESSION: Pt to benefit from continued efforts to improve knee mobility and strength to facilitate improved ambulation and functional movements.  AROM Rt knee improved in flexion compared to eval when reassessed today.  Continued skilled PT services.   OBJECTIVE IMPAIRMENTS: Abnormal gait, decreased balance, decreased mobility, difficulty walking, decreased ROM, decreased strength, increased edema, impaired flexibility, and pain.   ACTIVITY  LIMITATIONS: lifting, bending, sitting, standing, squatting, sleeping, stairs, and transfers  PARTICIPATION LIMITATIONS: driving, community activity, occupation, and yard work  PERSONAL FACTORS: 3+ comorbidities: see PMH above  are also affecting patient's functional outcome.   REHAB POTENTIAL: Good  CLINICAL DECISION MAKING: Stable/uncomplicated  EVALUATION COMPLEXITY: Low   GOALS: Goals reviewed with patient? Yes  SHORT TERM GOALS: (target date for Short term goals are 3 weeks 09/30/2023)   1.  Patient will demonstrate independent use of home exercise program to maintain progress from in clinic treatments.  Goal status: on going 09/15/2023  LONG TERM GOALS: (target dates for all long term goals are 12 weeks  12/02/2023 )   1. Patient will demonstrate/report pain at worst less than or equal to 2/10 to facilitate minimal limitation in daily activity secondary to pain symptoms.  Goal status: New   2. Patient will demonstrate independent use of home exercise program to facilitate ability to maintain/progress functional gains from skilled physical therapy services.  Goal status: New   3. Patient will demonstrate Patient specific functional scale avg > or = 6.25 to indicate reduced disability due to condition.   Goal status: New   4.  Patient will demonstrate Rt  LE MMT 5/5 throughout to faciltiate usual transfers, stairs, squatting at [facility] for daily life.   Goal status: New   5.  Patient will demonstrate active ROM arc of 4 - 115 degrees in his Rt knee to improve functional mobility and gait.  Goal status: New   6.  Pt will be able to navigate up and down 1 flight of stairs with reciprocal gait pattern with single hand rail.  Goal status: New    PLAN:  PT FREQUENCY: 2-3x/week  PT DURATION: 12 weeks  PLANNED INTERVENTIONS: Can include 16109- PT Re-evaluation, 97110-Therapeutic exercises, 97530- Therapeutic activity, 97112- Neuromuscular re-education, 97535- Self Care,  97140- Manual therapy, [phone]- Gait training, [phone]- Orthotic Fit/training, [phone]- Canalith repositioning, [id number]- Aquatic Therapy, [phone]- Electrical stimulation (unattended), [phone]- Electrical stimulation (manual), [id number] Physical performance testing, 97016- Vasopneumatic device, [id number]- Ultrasound, [id number]- Traction (mechanical), [id number]- Ionotophoresis 4mg /ml Dexamethasone, Patient/Family education, Balance training, Stair training, Taping, Dry Needling, Joint mobilization, Joint manipulation, Spinal manipulation, Spinal mobilization, Scar mobilization, Vestibular training, Visual/preceptual remediation/compensation, DME instructions, Cryotherapy, and Moist heat.  All performed as medically necessary.  All included unless contraindicated  PLAN FOR NEXT SESSION: Continued mobility gains, strength and balance improvements.    Chyrel Masson, PT, DPT, OCS, ATC 09/15/23  8:44 AM

## 2023-09-17 ENCOUNTER — Ambulatory Visit: Payer: Commercial Managed Care - PPO | Admitting: Rehabilitative and Restorative Service Providers"

## 2023-09-17 ENCOUNTER — Encounter: Payer: Self-pay | Admitting: Rehabilitative and Restorative Service Providers"

## 2023-09-17 DIAGNOSIS — R262 Difficulty in walking, not elsewhere classified: Secondary | ICD-10-CM | POA: Diagnosis not present

## 2023-09-17 DIAGNOSIS — M25561 Pain in right knee: Secondary | ICD-10-CM | POA: Diagnosis not present

## 2023-09-17 DIAGNOSIS — M6281 Muscle weakness (generalized): Secondary | ICD-10-CM | POA: Diagnosis not present

## 2023-09-17 DIAGNOSIS — R600 Localized edema: Secondary | ICD-10-CM | POA: Diagnosis not present

## 2023-09-17 NOTE — Therapy (Signed)
 OUTPATIENT PHYSICAL THERAPY TREATMENT NOTE   Patient Name: Rodney Stratton Perkins Jr. MRN: [id number] DOB:September 06, 1950, 73 y.o., male Today's Date: 09/17/2023  END OF SESSION:  PT End of Session - 09/17/23 0937     Visit Number 4    Number of Visits 27    Date for PT Re-Evaluation 12/02/23    PT Start Time 0931    PT Stop Time 1029    PT Time Calculation (min) 58 min    Activity Tolerance Patient tolerated treatment well;No increased pain;Patient limited by pain    Behavior During Therapy [facility] for tasks assessed/performed               Past Medical History:  Diagnosis Date   Allergy    SEASONAL   Anxiety    Arthritis    Asthma    as a child   BPH (benign prostatic hyperplasia)    Cancer (HCC)    prostatectomy 2018 Gleason 7   Cataract    BILATERAL   Coronary artery disease    Diabetes mellitus    average fasting 140s type 2   DJD (degenerative joint disease)    Heart murmur    dr. Gala Romney   Hepatitis    hep B   Hyperlipidemia    Hypertension    Neuromuscular disorder (HCC)    sciatica LLE   Obesity    Pneumonia    Sleep apnea    C-PAP   Past Surgical History:  Procedure Laterality Date   Biceps tendon reinsertion     colonoscopy with polypectomy     adenomatous; Dr Leone Payor   JOINT REPLACEMENT Left    knee   left knee arthroscopy     LYMPHADENECTOMY Bilateral 01/23/2017   Procedure: BILATERAL LYMPHADENECTOMY;  Surgeon: Heloise Purpura, MD;  Location: [facility];  Service: Urology;  Laterality: Bilateral;   PROSTATECTOMY     01/23/17 Dr. Laverle Patter   ROBOT ASSISTED LAPAROSCOPIC RADICAL PROSTATECTOMY N/A 01/23/2017   Procedure: XI ROBOTIC ASSISTED LAPAROSCOPIC RADICAL PROSTATECTOMY LEVEL 3;  Surgeon: Heloise Purpura, MD;  Location: [facility];  Service: Urology;  Laterality: N/A;   TONSILLECTOMY AND ADENOIDECTOMY     TOTAL KNEE ARTHROPLASTY Left 01/11/2013   Procedure: LEFT TOTAL KNEE ARTHROPLASTY;  Surgeon: Loanne Drilling, MD;  Location: [facility];  Service:  Orthopedics;  Laterality: Left;   TOTAL KNEE ARTHROPLASTY Right 08/22/2023   Procedure: RIGHT TOTAL KNEE ARTHROPLASTY;  Surgeon: Kathryne Hitch, MD;  Location: [facility];  Service: Orthopedics;  Laterality: Right;   TOTAL SHOULDER ARTHROPLASTY Left 08/12/2014   Procedure: LEFT TOTAL SHOULDER ARTHROPLASTY;  Surgeon: Verlee Rossetti, MD;  Location: [facility];  Service: Orthopedics;  Laterality: Left;   UMBILICAL HERNIA REPAIR     Patient Active Problem List   Diagnosis Date Noted   Status post total right knee replacement 08/22/2023   Situational anxiety 08/27/2022   Post-COVID syndrome 08/13/2021   Stress at home 08/13/2021   COVID-19 07/31/2021   Diabetic neuropathy (HCC) 06/27/2021   Biochemically recurrent malignant neoplasm of prostate (HCC) 04/06/2019   Dyslipidemia 08/25/2018   Well adult exam 08/25/2018   Actinic keratoses 08/25/2018   Prostate cancer (HCC) 01/23/2017   Diabetes mellitus (HCC) 10/04/2016   Aortic stenosis 08/19/2016   Coronary artery disease 08/04/2015   Obesity 08/04/2015   Osteoarthritis of multiple joints 08/04/2015   S/P TKR (total knee replacement) 08/04/2015   S/P shoulder replacement 08/12/2014   Arthritis of shoulder region, left, degenerative 03/22/2014   History of colonic  polyps 05/30/2010   Obstructive sleep apnea 05/10/2009   Essential hypertension 05/10/2009   History of hepatitis B virus infection 05/10/2009    PCP: Plotnikov, Georgina Quint, MD   REFERRING PROVIDER: Kathryne Hitch*   REFERRING DIAG:  Diagnosis  [phone] (ICD-10-CM) - Status post total right knee replacement    THERAPY DIAG:  Acute pain of right knee  Difficulty in walking, not elsewhere classified  Muscle weakness (generalized)  Localized edema  Rationale for Evaluation and Treatment: Rehabilitation  ONSET DATE: 08/22/23: Rt TKA  SUBJECTIVE:   SUBJECTIVE STATEMENT: Rodney Hayes reports good HEP compliance.  He slept around 7 hours uninterrupted last night,  which is the best since surgery.  PERTINENT HISTORY: Rt TKA on 08/22/23, h/o left TKA 2014, biceps tenon reinsertion  See further PMH above  PAIN:  NPRS scale: 1-5/10 this week Pain location: Rt knee Pain description: achy Aggravating factors: sleeping, prolonged postures, too much WB  Relieving factors: pain meds, ice  PRECAUTIONS: None  WEIGHT BEARING RESTRICTIONS: No  FALLS:  Has patient fallen in last 6 months? 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 No periprosthetic lucency or fracture. There has been patellar resurfacing. Recent postsurgical change includes air and edema in the soft tissues and joint space. Anterior skin staples.   IMPRESSION: Right knee arthroplasty without immediate postoperative complication.  PATIENT SURVEYS:   Patient-specific activity scoring scheme   "0" represents "unable to perform." "10" represents "able to perform at prior level. 0 1 2 3 4 5 6 7 8 9  10 (Date and Score) Activity Initial  Activity 09/09/23    walk 4     2. stairs 4     3. drive 3   4.sleeping 2        TOTAL SCORE   13/4 3.25    Total score = sum of the activity scores/number of activities Minimum detectable change (90%CI) for average score = 2 points Minimum detectable change (90%CI) for single activity score = 3 points   COGNITION: Overall cognitive status: [place]     LOWER EXTREMITY ROM:   ROM A: active, P: passive Right 09/09/23 Left 09/09/23 Right 09/16/2023 Right 09/17/2023  Hip flexion 108 110     Knee flexion A: 95 P:  100 A: 108 AROM supine heel slide 103 With strap 110  Knee extension A: -17 A: -2   -8   (Blank rows = not tested)  LOWER EXTREMITY MMT:  MMT Right 09/09/23 Left 09/09/23  Hip flexion 4 5  Hip extension    Hip abduction    Hip adduction    Knee flexion 4 5  Knee extension 4 5  Ankle dorsiflexion    Ankle plantarflexion     (Blank rows = not tested)    FUNCTIONAL TESTS:  09/09/23: 5 time sit to stand: 15.25 seconds UE support  GAIT: Eval: Distance walked: clinic distances Assistive device utilized: Single point cane Level of assistance: Modified independence Comments: antalgic gait, decreased knee flexion and hip flexion, decreased knee extension in stance phase  TODAY'S TREATMENT                                                                          DATE:  09/17/2023 Recumbent bike Seat 10 and Seat 9 for 4 minutes each AROM, full revolutions Seated knee extension machine 20# up bilateral and down right only with slow eccentrics 90* to 45* 15 x Knee flexion with strap with quadriceps sets between reps 10 x 5 seconds each Quadriceps sets with heel prop on right 10 x 5 seconds Seated knee extension stretch 3 minutes with 5# just above incision above the right knee  Functional Activities (sit to stand and stairs): Double Leg Press 100# 15 x full extension and stretch into flexion slow eccentrics Single leg Press Right only 50# 10 x slow eccentrics full extension and stretch into flexion  Vaso Medium 34* Right knee 10 minutes   09/15/2023 Therex: Recumbent bike partial circles to full circles 8 mins, seat 9  Gastroc stretch incline 3x30sec Seated Rt leg LAQ 3 lb weight with end range pause 1-2 sec each way 2 x 15 Seated quad set with SLR Rt leg 2 x 10 slow lowering focus  Seated  quad set 5 sec hold x 10  Supine Rt leg LAQ x 10 in 90 deg hip flexion  Supine Rt heel slide AROM 5 sec hold x 0   Theract:(to improve progressive mobility, walking, stairs, squatting, transfers).  Leg press 87lb x15,  then single leg 37lb 2x15 Seated reactive blazepod green/red light driving simulation pressure into bosuball 30 sec x 5 with 15 sec rest breaks.  Timeout 2 sec of lights  Vaso:  , 34deg, Rt knee medium pres   09/09/23 Therex: Nustep for warm up Gastroc stretch 3x30sec Leg press 87lb 2x10 then single leg 37lb 2x10 //bar knee flexion on 6in step x10 with 10sec hold; vc for proper form to increase knee flexion  Theract: 6in Step ups anterior 2x10; 1st bout with railing 2nd bout UE hover; standing rest between sets 6in Step ups lateral 2x10; 1st bout BUE on railing 2nd bout 1UE on railing; standing rest between sets  Neuro: Tandem balance 2x30sec ea Backward stepping x5lengths 1UE on rail   Vaso: , 34deg, R leg, BLE elevated, mod pressure  PATIENT EDUCATION:  Education details: HEP, POC Person educated: Patient Education method: Explanation, Demonstration, Verbal cues, and Handouts Education comprehension: verbalized understanding, returned demonstration, and verbal cues required  HOME EXERCISE PROGRAM: Access Code: [email or web address] URL: [email or web address] Date: 09/09/2023 Prepared by: Narda Amber  Exercises - Seated Hamstring Stretch  - 3 x daily - 7 x weekly - 3 reps - 30 seconds hold - Seated Long Arc Quad  - 3 x daily - 7 x weekly - 2 sets - 10 reps - 3 seconds hold - Sit to Stand with Counter Support  - 3 x daily - 7 x weekly - 2 sets - 10 reps - Seated Quad Set  - 3 x daily - 7 x weekly - 10 reps - 5-10 seconds with overpressure hold - Supine Heel Slide with Strap  - 3 x daily - 7 x weekly - 10 reps - 5 seconds hold  ASSESSMENT:  CLINICAL IMPRESSION: AROM 8 -  0 - 110 degrees today with strap assistance with flexion.  Rodney Hayes  and I discussed making extension range a priority with his home exercises given his good early progress in other areas (edema, flexion and quadriceps strength).  All of these areas are important, although extension range deserves special attention.  OBJECTIVE IMPAIRMENTS: Abnormal gait, decreased balance, decreased mobility, difficulty walking, decreased ROM, decreased strength, increased edema, impaired flexibility, and pain.   ACTIVITY LIMITATIONS: lifting, bending, sitting, standing, squatting, sleeping, stairs, and transfers  PARTICIPATION LIMITATIONS: driving, community activity, occupation, and yard work  PERSONAL FACTORS: 3+ comorbidities: see PMH above  are also affecting patient's functional outcome.   REHAB POTENTIAL: Good  CLINICAL DECISION MAKING: Stable/uncomplicated  EVALUATION COMPLEXITY: Low   GOALS: Goals reviewed with patient? Yes  SHORT TERM GOALS: (target date for Short term goals are 3 weeks 09/30/2023)   1.  Patient will demonstrate independent use of home exercise program to maintain progress from in clinic treatments.  Goal status: Met 09/17/2023  LONG TERM GOALS: (target dates for all long term goals are 12 weeks  12/02/2023 )   1. Patient will demonstrate/report pain at worst less than or equal to 2/10 to facilitate minimal limitation in daily activity secondary to pain symptoms.  Goal status: New   2. Patient will demonstrate independent use of home exercise program to facilitate ability to maintain/progress functional gains from skilled physical therapy services.  Goal status: New   3. Patient will demonstrate Patient specific functional scale avg > or = 6.25 to indicate reduced disability due to condition.   Goal status: New   4.  Patient will demonstrate Rt  LE MMT 5/5 throughout to faciltiate usual transfers, stairs, squatting at [facility] for daily life.   Goal status: New   5.  Patient will demonstrate active ROM arc of 4 - 115 degrees in his Rt  knee to improve functional mobility and gait.  Goal status: New   6.  Pt will be able to navigate up and down 1 flight of stairs with reciprocal gait pattern with single hand rail.  Goal status: New    PLAN:  PT FREQUENCY: 2-3x/week  PT DURATION: 12 weeks  PLANNED INTERVENTIONS: Can include 81191- PT Re-evaluation, 97110-Therapeutic exercises, 97530- Therapeutic activity, 97112- Neuromuscular re-education, 97535- Self Care, 97140- Manual therapy, [phone]- Gait training, [phone]- Orthotic Fit/training, [phone]- Canalith repositioning, [id number]- Aquatic Therapy, [phone]- Electrical stimulation (unattended), [phone]- Electrical stimulation (manual), [id number] Physical performance testing, 97016- Vasopneumatic device, [id number]- Ultrasound, [id number]- Traction (mechanical), [id number]- Ionotophoresis 4mg /ml Dexamethasone, Patient/Family education, Balance training, Stair training, Taping, Dry Needling, Joint mobilization, Joint manipulation, Spinal manipulation, Spinal mobilization, Scar mobilization, Vestibular training, Visual/preceptual remediation/compensation, DME instructions, Cryotherapy, and Moist heat.  All performed as medically necessary.  All included unless contraindicated  PLAN FOR NEXT SESSION: Continued mobility gains (particularly extension), quadriceps strength and balance improvements.    Cherlyn Cushing PT, MPT 09/17/23  10:42 AM

## 2023-09-18 ENCOUNTER — Encounter: Payer: Commercial Managed Care - PPO | Admitting: Physical Therapy

## 2023-09-23 ENCOUNTER — Ambulatory Visit: Payer: Commercial Managed Care - PPO | Admitting: Physical Therapy

## 2023-09-23 ENCOUNTER — Encounter: Payer: Self-pay | Admitting: Physical Therapy

## 2023-09-23 DIAGNOSIS — R262 Difficulty in walking, not elsewhere classified: Secondary | ICD-10-CM

## 2023-09-23 DIAGNOSIS — R600 Localized edema: Secondary | ICD-10-CM | POA: Diagnosis not present

## 2023-09-23 DIAGNOSIS — M25561 Pain in right knee: Secondary | ICD-10-CM

## 2023-09-23 DIAGNOSIS — M6281 Muscle weakness (generalized): Secondary | ICD-10-CM | POA: Diagnosis not present

## 2023-09-23 NOTE — Therapy (Signed)
 OUTPATIENT PHYSICAL THERAPY TREATMENT NOTE   Patient Name: Rodney Sauceda Molinari Jr. MRN: [id number] DOB:07-Dec-1950, 73 y.o., male Today's Date: 09/23/2023  END OF SESSION:  PT End of Session - 09/23/23 1307     Visit Number 5    Number of Visits 27    Date for PT Re-Evaluation 12/02/23    PT Start Time 1300    PT Stop Time 1348    PT Time Calculation (min) 48 min    Activity Tolerance Patient tolerated treatment well    Behavior During Therapy WFL for tasks assessed/performed               Past Medical History:  Diagnosis Date   Allergy    SEASONAL   Anxiety    Arthritis    Asthma    as a child   BPH (benign prostatic hyperplasia)    Cancer (HCC)    prostatectomy 2018 Gleason 7   Cataract    BILATERAL   Coronary artery disease    Diabetes mellitus    average fasting 140s type 2   DJD (degenerative joint disease)    Heart murmur    dr. Gala Romney   Hepatitis    hep B   Hyperlipidemia    Hypertension    Neuromuscular disorder (HCC)    sciatica LLE   Obesity    Pneumonia    Sleep apnea    C-PAP   Past Surgical History:  Procedure Laterality Date   Biceps tendon reinsertion     colonoscopy with polypectomy     adenomatous; Dr Leone Payor   JOINT REPLACEMENT Left    knee   left knee arthroscopy     LYMPHADENECTOMY Bilateral 01/23/2017   Procedure: BILATERAL LYMPHADENECTOMY;  Surgeon: Heloise Purpura, MD;  Location: [facility];  Service: Urology;  Laterality: Bilateral;   PROSTATECTOMY     01/23/17 Dr. Laverle Patter   ROBOT ASSISTED LAPAROSCOPIC RADICAL PROSTATECTOMY N/A 01/23/2017   Procedure: XI ROBOTIC ASSISTED LAPAROSCOPIC RADICAL PROSTATECTOMY LEVEL 3;  Surgeon: Heloise Purpura, MD;  Location: [facility];  Service: Urology;  Laterality: N/A;   TONSILLECTOMY AND ADENOIDECTOMY     TOTAL KNEE ARTHROPLASTY Left 01/11/2013   Procedure: LEFT TOTAL KNEE ARTHROPLASTY;  Surgeon: Loanne Drilling, MD;  Location: [facility];  Service: Orthopedics;  Laterality: Left;   TOTAL KNEE  ARTHROPLASTY Right 08/22/2023   Procedure: RIGHT TOTAL KNEE ARTHROPLASTY;  Surgeon: Kathryne Hitch, MD;  Location: [facility];  Service: Orthopedics;  Laterality: Right;   TOTAL SHOULDER ARTHROPLASTY Left 08/12/2014   Procedure: LEFT TOTAL SHOULDER ARTHROPLASTY;  Surgeon: Verlee Rossetti, MD;  Location: [facility];  Service: Orthopedics;  Laterality: Left;   UMBILICAL HERNIA REPAIR     Patient Active Problem List   Diagnosis Date Noted   Status post total right knee replacement 08/22/2023   Situational anxiety 08/27/2022   Post-COVID syndrome 08/13/2021   Stress at home 08/13/2021   COVID-19 07/31/2021   Diabetic neuropathy (HCC) 06/27/2021   Biochemically recurrent malignant neoplasm of prostate (HCC) 04/06/2019   Dyslipidemia 08/25/2018   Well adult exam 08/25/2018   Actinic keratoses 08/25/2018   Prostate cancer (HCC) 01/23/2017   Diabetes mellitus (HCC) 10/04/2016   Aortic stenosis 08/19/2016   Coronary artery disease 08/04/2015   Obesity 08/04/2015   Osteoarthritis of multiple joints 08/04/2015   S/P TKR (total knee replacement) 08/04/2015   S/P shoulder replacement 08/12/2014   Arthritis of shoulder region, left, degenerative 03/22/2014   History of colonic polyps 05/30/2010   Obstructive  sleep apnea 05/10/2009   Essential hypertension 05/10/2009   History of hepatitis B virus infection 05/10/2009    PCP: Plotnikov, Georgina Quint, MD   REFERRING PROVIDER: Kathryne Hitch*   REFERRING DIAG:  Diagnosis  [phone] (ICD-10-CM) - Status post total right knee replacement    THERAPY DIAG:  Acute pain of right knee  Difficulty in walking, not elsewhere classified  Muscle weakness (generalized)  Localized edema  Rationale for Evaluation and Treatment: Rehabilitation  ONSET DATE: 08/22/23: Rt TKA  SUBJECTIVE:   SUBJECTIVE STATEMENT: Rodney Hayes reports his knee is getting better PERTINENT HISTORY: Rt TKA on 08/22/23, h/o left TKA 2014, biceps tenon reinsertion  See  further PMH above  PAIN:  NPRS scale: 1-2/10 today Pain location: Rt knee Pain description: achy Aggravating factors: sleeping, prolonged postures, too much WB  Relieving factors: pain meds, ice  PRECAUTIONS: None  WEIGHT BEARING RESTRICTIONS: No  FALLS:  Has patient fallen in last 6 months? 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 No periprosthetic lucency or fracture. There has been patellar resurfacing. Recent postsurgical change includes air and edema in the soft tissues and joint space. Anterior skin staples.   IMPRESSION: Right knee arthroplasty without immediate postoperative complication.  PATIENT SURVEYS:   Patient-specific activity scoring scheme   "0" represents "unable to perform." "10" represents "able to perform at prior level. 0 1 2 3 4 5 6 7 8 9  10 (Date and Score) Activity Initial  Activity 09/09/23    walk 4     2. stairs 4     3. drive 3   4.sleeping 2        TOTAL SCORE   13/4 3.25    Total score = sum of the activity scores/number of activities Minimum detectable change (90%CI) for average score = 2 points Minimum detectable change (90%CI) for single activity score = 3 points   COGNITION: Overall cognitive status: [facility]     LOWER EXTREMITY ROM:   ROM A: active, P: passive Right 09/09/23 Left 09/09/23 Right 09/16/2023 Right 09/17/2023  Hip flexion 108 110    Knee flexion A: 95 P:  100 A: 108 AROM supine heel slide 103 With strap 110  Knee extension A: -17 A: -2   -8   (Blank  rows = not tested)  LOWER EXTREMITY MMT:  MMT Right 09/09/23 Left 09/09/23  Hip flexion 4 5  Hip extension    Hip abduction    Hip adduction    Knee flexion 4 5  Knee extension 4 5  Ankle dorsiflexion    Ankle plantarflexion     (Blank rows = not tested)    FUNCTIONAL TESTS:  09/09/23: 5 time sit to stand: 15.25 seconds UE support  GAIT: Eval: Distance walked: clinic distances Assistive device utilized: Single point cane Level of assistance: Modified independence Comments: antalgic gait, decreased knee flexion and hip flexion, decreased knee extension in stance phase  TODAY'S TREATMENT                                                                          DATE:  09/23/2023 Recumbent bike Seat 10 X 10 min full revolutions Gastroc stretch 30 sec X 3 on slantboard Hamstring stretch with foot on top of leg press in between DL sets and SL sets, 1 minute stretch X 2 Rt knee PROM into extension with overpressure 5 sec, 2X10   Functional Activities (sit to stand and stairs): Double Leg Press 100# 2X12 x full extension and stretch into flexion slow eccentrics Single leg Press Right only 50# 10 x slow eccentrics full extension and stretch into flexion Seated knee extension machine 20# up bilateral and down right only with slow eccentrics 90* to 45* 15 x, then Rt leg only 15# 2X10 Step ups forward leading Rt leg 6 inch X 10 with 2 UE support Step downs leading with left leg for WB on Rt leg,  X10 with 2 UE support  Vaso Medium 34* Right knee 10 minutes  09/17/2023 Recumbent bike Seat 10 and Seat 9 for 4 minutes each AROM, full revolutions Seated knee extension machine 20# up bilateral and down right only with slow eccentrics 90* to 45* 15 x Knee flexion with strap with quadriceps sets between reps 10 x 5 seconds  each Quadriceps sets with heel prop on right 10 x 5 seconds Seated knee extension stretch 3 minutes with 5# just above incision above the right knee  Functional Activities (sit to stand and stairs): Double Leg Press 100# 15 x full extension and stretch into flexion slow eccentrics Single leg Press Right only 50# 10 x slow eccentrics full extension and stretch into flexion  Vaso Medium 34* Right knee 10 minutes   09/15/2023 Therex: Recumbent bike partial circles to full circles 8 mins, seat 9  Gastroc stretch incline 3x30sec Seated Rt leg LAQ 3 lb weight with end range pause 1-2 sec each way 2 x 15 Seated quad set with SLR Rt leg 2 x 10 slow lowering focus  Seated quad set 5 sec hold x 10  Supine Rt leg LAQ x 10 in 90 deg hip flexion  Supine Rt heel slide AROM 5 sec hold x 0   Theract:(to improve progressive mobility, walking, stairs, squatting, transfers).  Leg press 87lb x15,  then single leg 37lb 2x15 Seated reactive blazepod green/red light driving simulation pressure into bosuball 30 sec x 5 with 15 sec rest breaks.  Timeout 2 sec of lights  Vaso:  , 34deg, Rt knee medium pres    PATIENT EDUCATION:  Education details: HEP, POC Person educated: Patient Education method: Explanation, Demonstration, Verbal cues, and Handouts Education comprehension: verbalized understanding, returned demonstration, and verbal cues required  HOME EXERCISE PROGRAM: Access Code: [email or web address] URL: [email or web address] Date: 09/09/2023 Prepared by: Narda Amber  Exercises - Seated Hamstring Stretch  - 3 x daily - 7 x weekly - 3 reps - 30 seconds hold - Seated Long Arc Quad  - 3 x daily - 7 x weekly - 2 sets - 10 reps - 3 seconds hold - Sit to Stand with Counter Support  - 3 x daily - 7 x weekly - 2 sets -  10 reps - Seated Quad Set  - 3 x daily - 7 x weekly - 10 reps - 5-10 seconds with overpressure hold - Supine Heel Slide with Strap  - 3 x daily - 7 x weekly - 10 reps - 5  seconds hold  ASSESSMENT:  CLINICAL IMPRESSION: He is overall doing quite well up to this point S/P Rt TKA. PT will continue to progress as tolerated to improve function.  OBJECTIVE IMPAIRMENTS: Abnormal gait, decreased balance, decreased mobility, difficulty walking, decreased ROM, decreased strength, increased edema, impaired flexibility, and pain.   ACTIVITY LIMITATIONS: lifting, bending, sitting, standing, squatting, sleeping, stairs, and transfers  PARTICIPATION LIMITATIONS: driving, community activity, occupation, and yard work  PERSONAL FACTORS: 3+ comorbidities: see PMH above  are also affecting patient's functional outcome.   REHAB POTENTIAL: Good  CLINICAL DECISION MAKING: Stable/uncomplicated  EVALUATION COMPLEXITY: Low   GOALS: Goals reviewed with patient? Yes  SHORT TERM GOALS: (target date for Short term goals are 3 weeks 09/30/2023)   1.  Patient will demonstrate independent use of home exercise program to maintain progress from in clinic treatments.  Goal status: Met 09/17/2023  LONG TERM GOALS: (target dates for all long term goals are 12 weeks  12/02/2023 )   1. Patient will demonstrate/report pain at worst less than or equal to 2/10 to facilitate minimal limitation in daily activity secondary to pain symptoms.  Goal status: New   2. Patient will demonstrate independent use of home exercise program to facilitate ability to maintain/progress functional gains from skilled physical therapy services.  Goal status: New   3. Patient will demonstrate Patient specific functional scale avg > or = 6.25 to indicate reduced disability due to condition.   Goal status: New   4.  Patient will demonstrate Rt  LE MMT 5/5 throughout to faciltiate usual transfers, stairs, squatting at [facility] for daily life.   Goal status: New   5.  Patient will demonstrate active ROM arc of 4 - 115 degrees in his Rt knee to improve functional mobility and gait.  Goal status: New   6.  Pt  will be able to navigate up and down 1 flight of stairs with reciprocal gait pattern with single hand rail.  Goal status: New    PLAN:  PT FREQUENCY: 2-3x/week  PT DURATION: 12 weeks  PLANNED INTERVENTIONS: Can include 84132- PT Re-evaluation, 97110-Therapeutic exercises, 97530- Therapeutic activity, 97112- Neuromuscular re-education, 97535- Self Care, 97140- Manual therapy, [phone]- Gait training, [phone]- Orthotic Fit/training, [phone]- Canalith repositioning, [id number]- Aquatic Therapy, [phone]- Electrical stimulation (unattended), [phone]- Electrical stimulation (manual), [id number] Physical performance testing, 97016- Vasopneumatic device, [id number]- Ultrasound, [id number]- Traction (mechanical), [id number]- Ionotophoresis 4mg /ml Dexamethasone, Patient/Family education, Balance training, Stair training, Taping, Dry Needling, Joint mobilization, Joint manipulation, Spinal manipulation, Spinal mobilization, Scar mobilization, Vestibular training, Visual/preceptual remediation/compensation, DME instructions, Cryotherapy, and Moist heat.  All performed as medically necessary.  All included unless contraindicated  PLAN FOR NEXT SESSION: Continued mobility gains (particularly extension), quadriceps strength and balance improvements.   Ivery Quale, PT, DPT 09/23/23 1:44 PM

## 2023-09-24 ENCOUNTER — Encounter: Payer: Self-pay | Admitting: Rehabilitative and Restorative Service Providers"

## 2023-09-24 ENCOUNTER — Ambulatory Visit: Payer: Commercial Managed Care - PPO | Admitting: Rehabilitative and Restorative Service Providers"

## 2023-09-24 DIAGNOSIS — R600 Localized edema: Secondary | ICD-10-CM

## 2023-09-24 DIAGNOSIS — M25561 Pain in right knee: Secondary | ICD-10-CM

## 2023-09-24 DIAGNOSIS — R262 Difficulty in walking, not elsewhere classified: Secondary | ICD-10-CM

## 2023-09-24 DIAGNOSIS — M6281 Muscle weakness (generalized): Secondary | ICD-10-CM | POA: Diagnosis not present

## 2023-09-24 NOTE — Therapy (Signed)
 OUTPATIENT PHYSICAL THERAPY TREATMENT NOTE   Patient Name: Rodney Penton Schnee Jr. MRN: [id number] DOB:31-Dec-1950, 73 y.o., male Today's Date: 09/24/2023  END OF SESSION:  PT End of Session - 09/24/23 1254     Visit Number 6    Number of Visits 27    Date for PT Re-Evaluation 12/02/23    PT Start Time 1255    PT Stop Time 1346    PT Time Calculation (min) 51 min    Activity Tolerance Patient tolerated treatment well;No increased pain    Behavior During Therapy WFL for tasks assessed/performed              Past Medical History:  Diagnosis Date   Allergy    SEASONAL   Anxiety    Arthritis    Asthma    as a child   BPH (benign prostatic hyperplasia)    Cancer (HCC)    prostatectomy 2018 Gleason 7   Cataract    BILATERAL   Coronary artery disease    Diabetes mellitus    average fasting 140s type 2   DJD (degenerative joint disease)    Heart murmur    dr. Gala Romney   Hepatitis    hep B   Hyperlipidemia    Hypertension    Neuromuscular disorder (HCC)    sciatica LLE   Obesity    Pneumonia    Sleep apnea    C-PAP   Past Surgical History:  Procedure Laterality Date   Biceps tendon reinsertion     colonoscopy with polypectomy     adenomatous; Dr Leone Payor   JOINT REPLACEMENT Left    knee   left knee arthroscopy     LYMPHADENECTOMY Bilateral 01/23/2017   Procedure: BILATERAL LYMPHADENECTOMY;  Surgeon: Heloise Purpura, MD;  Location: [facility];  Service: Urology;  Laterality: Bilateral;   PROSTATECTOMY     01/23/17 Dr. Laverle Patter   ROBOT ASSISTED LAPAROSCOPIC RADICAL PROSTATECTOMY N/A 01/23/2017   Procedure: XI ROBOTIC ASSISTED LAPAROSCOPIC RADICAL PROSTATECTOMY LEVEL 3;  Surgeon: Heloise Purpura, MD;  Location: [facility];  Service: Urology;  Laterality: N/A;   TONSILLECTOMY AND ADENOIDECTOMY     TOTAL KNEE ARTHROPLASTY Left 01/11/2013   Procedure: LEFT TOTAL KNEE ARTHROPLASTY;  Surgeon: Loanne Drilling, MD;  Location: [facility];  Service: Orthopedics;  Laterality: Left;    TOTAL KNEE ARTHROPLASTY Right 08/22/2023   Procedure: RIGHT TOTAL KNEE ARTHROPLASTY;  Surgeon: Kathryne Hitch, MD;  Location: [facility];  Service: Orthopedics;  Laterality: Right;   TOTAL SHOULDER ARTHROPLASTY Left 08/12/2014   Procedure: LEFT TOTAL SHOULDER ARTHROPLASTY;  Surgeon: Verlee Rossetti, MD;  Location: [facility];  Service: Orthopedics;  Laterality: Left;   UMBILICAL HERNIA REPAIR     Patient Active Problem List   Diagnosis Date Noted   Status post total right knee replacement 08/22/2023   Situational anxiety 08/27/2022   Post-COVID syndrome 08/13/2021   Stress at home 08/13/2021   COVID-19 07/31/2021   Diabetic neuropathy (HCC) 06/27/2021   Biochemically recurrent malignant neoplasm of prostate (HCC) 04/06/2019   Dyslipidemia 08/25/2018   Well adult exam 08/25/2018   Actinic keratoses 08/25/2018   Prostate cancer (HCC) 01/23/2017   Diabetes mellitus (HCC) 10/04/2016   Aortic stenosis 08/19/2016   Coronary artery disease 08/04/2015   Obesity 08/04/2015   Osteoarthritis of multiple joints 08/04/2015   S/P TKR (total knee replacement) 08/04/2015   S/P shoulder replacement 08/12/2014   Arthritis of shoulder region, left, degenerative 03/22/2014   History of colonic polyps 05/30/2010  Obstructive sleep apnea 05/10/2009   Essential hypertension 05/10/2009   History of hepatitis B virus infection 05/10/2009    PCP: Plotnikov, Georgina Quint, MD   REFERRING PROVIDER: Kathryne Hitch*   REFERRING DIAG:  Diagnosis  [phone] (ICD-10-CM) - Status post total right knee replacement    THERAPY DIAG:  Acute pain of right knee  Difficulty in walking, not elsewhere classified  Muscle weakness (generalized)  Localized edema  Rationale for Evaluation and Treatment: Rehabilitation  ONSET DATE: 08/22/23: Rt TKA  SUBJECTIVE:   SUBJECTIVE STATEMENT: Rodney Hayes reports his knee is still sore from yesterday's session.  He realizes this is normal and extension AROM remains a  high priority.  PERTINENT HISTORY: Rt TKA on 08/22/23, h/o left TKA 2014, biceps tenon reinsertion  See further PMH above  PAIN:  NPRS scale: 0-5/10 this week Pain location: Rt knee Pain description: achy Aggravating factors: sleeping, prolonged postures, too much WB  Relieving factors: pain meds, ice  PRECAUTIONS: None  WEIGHT BEARING RESTRICTIONS: No  FALLS:  Has patient fallen in last 6 months? 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 No periprosthetic lucency or fracture. There has been patellar resurfacing. Recent postsurgical change includes air and edema in the soft tissues and joint space. Anterior skin staples.   IMPRESSION: Right knee arthroplasty without immediate postoperative complication.  PATIENT SURVEYS:   Patient-specific activity scoring scheme   "0" represents "unable to perform." "10" represents "able to perform at prior level. 0 1 2 3 4 5 6 7 8 9  10 (Date and Score) Activity Initial  Activity 09/09/23    walk 4     2. stairs 4     3. drive 3   4.sleeping 2        TOTAL SCORE   13/4 3.25    Total score = sum of the activity scores/number of activities Minimum detectable change (90%CI) for average score = 2 points Minimum detectable change (90%CI) for single activity score = 3 points   COGNITION: Overall cognitive status: [facility]     LOWER EXTREMITY ROM:   ROM A: active, P: passive Right 09/09/23 Left 09/09/23 Right 09/16/2023 Right 09/17/2023 Right 09/24/2023  Hip flexion 108 110      Knee flexion A: 95 P:  100 A: 108 AROM supine heel slide 103 With strap 110 With strap 114  Knee extension A: -17 A: -2   -8 -6   (Blank rows = not tested)  LOWER EXTREMITY MMT:  MMT Right 09/09/23 Left 09/09/23  Hip flexion 4 5  Hip extension    Hip abduction    Hip adduction    Knee flexion 4 5  Knee extension 4 5  Ankle dorsiflexion    Ankle plantarflexion     (Blank rows = not tested)    FUNCTIONAL TESTS:  09/09/23: 5 time sit to stand: 15.25 seconds UE support  GAIT: Eval: Distance walked: clinic distances Assistive device utilized: Single point cane Level of assistance: Modified independence Comments: antalgic gait, decreased knee flexion and hip flexion, decreased knee extension in stance  phase                                                                                                                                                                        TODAY'S TREATMENT                                                                          DATE:  09/24/2023 Recumbent bike Seat 10 for 5 minutes (extension emphasis) Level 3 Seated knee extension machine 25# up bilateral and down right only with slow eccentrics 90* to 45* 15 x Knee flexion with strap with quadriceps sets between reps 10 x 5 seconds each Quadriceps sets with heel prop on right 10 x 5 seconds Seated knee extension stretch 3 minutes with 5# just above incision above the right knee Prone quadriceps stretch 5 x 20 seconds  Functional Activities (sit to stand and stairs): Double Leg Press 125# 15 x full extension and stretch into flexion slow eccentrics Single leg Press Right only 62# 15 x slow eccentrics full extension and stretch into flexion  Vaso Medium 34* Right knee 10 minutes   09/23/2023 Recumbent bike Seat 10 X 10 min full revolutions Gastroc stretch 30 sec X 3 on slantboard Hamstring stretch with foot on top of leg press in between DL sets and SL sets, 1 minute stretch X 2 Rt knee  PROM into extension with overpressure 5 sec, 2X10   Functional Activities (sit to stand and stairs): Double Leg Press 100# 2X12 x full extension and stretch into flexion slow eccentrics Single leg Press Right only 50# 10 x slow eccentrics full extension and stretch into flexion Seated knee extension machine 20# up bilateral and down right only with slow eccentrics 90* to 45* 15 x, then Rt leg only 15# 2X10 Step ups forward leading Rt leg 6 inch X 10 with 2 UE support Step downs leading with left leg for WB on Rt leg,  X10 with 2 UE support  Vaso Medium 34* Right knee 10 minutes   09/17/2023 Recumbent bike Seat 10 and Seat 9 for 4 minutes each AROM, full revolutions Seated knee extension machine 20# up bilateral and down right only with slow eccentrics 90* to 45* 15 x Knee flexion with strap with quadriceps sets between reps 10 x 5 seconds each Quadriceps sets with heel prop on right 10 x 5 seconds Seated knee extension stretch 3 minutes with 5# just above incision above the right knee  Functional Activities (sit to stand and stairs): Double Leg Press 100# 15 x full extension and stretch into flexion slow eccentrics Single leg Press Right only 50# 10 x slow eccentrics full extension and stretch into flexion  Vaso Medium 34* Right knee 10 minutes   PATIENT EDUCATION:  Education details: HEP, POC Person educated: Patient Education method: Programmer, multimedia, Demonstration, Verbal cues, and Handouts Education comprehension: verbalized understanding, returned demonstration, and verbal cues required  HOME EXERCISE PROGRAM: Access Code: [email or web address] URL: [email or web address] Date: 09/09/2023 Prepared by: Narda Amber  Exercises - Seated Hamstring Stretch  - 3 x daily - 7 x weekly - 3 reps - 30 seconds hold - Seated Long Arc Quad  - 3 x daily - 7 x weekly - 2 sets - 10 reps - 3 seconds hold - Sit to Stand with Counter Support  - 3 x daily - 7 x weekly - 2 sets - 10 reps -  Seated Quad Set  - 3 x daily - 7 x weekly - 10 reps - 5-10 seconds with overpressure hold - Supine Heel Slide with Strap  - 3 x daily - 7 x weekly - 10 reps - 5 seconds hold  ASSESSMENT:  CLINICAL IMPRESSION: AROM was 6 - 0 - 114 today (AAROM with flexion).  Edema control, quadriceps strength and extension AROM remain high priorities.  We added a prone quadriceps stretch to assist with the ability to descend stairs.  Continue supervised PT to meet long-term goals.  OBJECTIVE IMPAIRMENTS: Abnormal gait, decreased balance, decreased mobility, difficulty walking, decreased ROM, decreased strength, increased edema, impaired flexibility, and pain.   ACTIVITY LIMITATIONS: lifting, bending, sitting, standing, squatting, sleeping, stairs, and transfers  PARTICIPATION LIMITATIONS: driving, community activity, occupation, and yard work  PERSONAL FACTORS: 3+ comorbidities: see PMH above  are also affecting patient's functional outcome.   REHAB POTENTIAL: Good  CLINICAL DECISION MAKING: Stable/uncomplicated  EVALUATION COMPLEXITY: Low   GOALS: Goals reviewed with patient? Yes  SHORT TERM GOALS: (target date for Short term goals are 3 weeks 09/30/2023)   1.  Patient will demonstrate independent use of home exercise program to maintain progress from in clinic treatments.  Goal status: Met 09/17/2023  LONG TERM GOALS: (target dates for all long term goals are 12 weeks  12/02/2023 )   1. Patient will demonstrate/report pain at worst less than or equal to 2/10 to facilitate minimal limitation in daily activity secondary to pain symptoms.  Goal status: On Going 09/24/2023   2. Patient will demonstrate independent use of home exercise program to facilitate ability to maintain/progress functional gains from skilled physical therapy services.  Goal status: New   3. Patient will demonstrate Patient specific functional scale avg > or = 6.25 to indicate reduced disability due to condition.   Goal  status: New   4.  Patient will demonstrate Rt  LE MMT 5/5 throughout to faciltiate usual transfers, stairs, squatting at [facility] for daily life.   Goal status: New   5.  Patient will demonstrate active ROM arc of 4 - 115 degrees in his Rt knee to improve functional mobility and gait.  Goal status: On Going 09/24/2023   6.  Pt will be able to navigate up and down 1 flight of stairs with reciprocal gait pattern with single hand rail.  Goal status: New    PLAN:  PT FREQUENCY: 2-3x/week  PT DURATION: 12 weeks  PLANNED INTERVENTIONS: Can include 84696- PT Re-evaluation, 97110-Therapeutic exercises, 97530- Therapeutic activity, [id number]- Neuromuscular re-education, 97535- Self Care, [id number]-  Manual therapy, [id number]- Gait training, 09811- Orthotic Fit/training, 91478- Canalith repositioning, 29562- Aquatic Therapy, [phone]- Electrical stimulation (unattended), [phone]- Electrical stimulation (manual), [id number] Physical performance testing, 96295- Vasopneumatic device, [id number]- Ultrasound, [id number]- Traction (mechanical), [id number]- Ionotophoresis 4mg /ml Dexamethasone, Patient/Family education, Balance training, Stair training, Taping, Dry Needling, Joint mobilization, Joint manipulation, Spinal manipulation, Spinal mobilization, Scar mobilization, Vestibular training, Visual/preceptual remediation/compensation, DME instructions, Cryotherapy, and Moist heat.  All performed as medically necessary.  All included unless contraindicated  PLAN FOR NEXT SESSION: Continued mobility gains (particularly extension), quadriceps strength and balance improvements.  Maybe try stairs to see if tight quadriceps are affecting this.  Cherlyn Cushing PT, MPT 09/24/23 1:37 PM

## 2023-09-25 ENCOUNTER — Ambulatory Visit: Payer: Commercial Managed Care - PPO | Admitting: Rehabilitative and Restorative Service Providers"

## 2023-09-25 ENCOUNTER — Encounter: Payer: Self-pay | Admitting: Rehabilitative and Restorative Service Providers"

## 2023-09-25 DIAGNOSIS — M25561 Pain in right knee: Secondary | ICD-10-CM

## 2023-09-25 DIAGNOSIS — R262 Difficulty in walking, not elsewhere classified: Secondary | ICD-10-CM | POA: Diagnosis not present

## 2023-09-25 DIAGNOSIS — M6281 Muscle weakness (generalized): Secondary | ICD-10-CM | POA: Diagnosis not present

## 2023-09-25 DIAGNOSIS — R600 Localized edema: Secondary | ICD-10-CM | POA: Diagnosis not present

## 2023-09-25 NOTE — Therapy (Signed)
 OUTPATIENT PHYSICAL THERAPY TREATMENT NOTE   Patient Name: Rodney Misko Delisa Jr. MRN: [id number] DOB:Jun 04, 1951, 73 y.o., male Today's Date: 09/25/2023  END OF SESSION:  PT End of Session - 09/25/23 1301     Visit Number 7    Number of Visits 27    Date for PT Re-Evaluation 12/02/23    PT Start Time 1258    PT Stop Time 1348    PT Time Calculation (min) 50 min    Activity Tolerance Patient tolerated treatment well;No increased pain    Behavior During Therapy WFL for tasks assessed/performed             Past Medical History:  Diagnosis Date   Allergy    SEASONAL   Anxiety    Arthritis    Asthma    as a child   BPH (benign prostatic hyperplasia)    Cancer (HCC)    prostatectomy 2018 Gleason 7   Cataract    BILATERAL   Coronary artery disease    Diabetes mellitus    average fasting 140s type 2   DJD (degenerative joint disease)    Heart murmur    dr. Gala Romney   Hepatitis    hep B   Hyperlipidemia    Hypertension    Neuromuscular disorder (HCC)    sciatica LLE   Obesity    Pneumonia    Sleep apnea    C-PAP   Past Surgical History:  Procedure Laterality Date   Biceps tendon reinsertion     colonoscopy with polypectomy     adenomatous; Dr Leone Payor   JOINT REPLACEMENT Left    knee   left knee arthroscopy     LYMPHADENECTOMY Bilateral 01/23/2017   Procedure: BILATERAL LYMPHADENECTOMY;  Surgeon: Heloise Purpura, MD;  Location: [facility];  Service: Urology;  Laterality: Bilateral;   PROSTATECTOMY     01/23/17 Dr. Laverle Patter   ROBOT ASSISTED LAPAROSCOPIC RADICAL PROSTATECTOMY N/A 01/23/2017   Procedure: XI ROBOTIC ASSISTED LAPAROSCOPIC RADICAL PROSTATECTOMY LEVEL 3;  Surgeon: Heloise Purpura, MD;  Location: [facility];  Service: Urology;  Laterality: N/A;   TONSILLECTOMY AND ADENOIDECTOMY     TOTAL KNEE ARTHROPLASTY Left 01/11/2013   Procedure: LEFT TOTAL KNEE ARTHROPLASTY;  Surgeon: Loanne Drilling, MD;  Location: [facility];  Service: Orthopedics;  Laterality: Left;    TOTAL KNEE ARTHROPLASTY Right 08/22/2023   Procedure: RIGHT TOTAL KNEE ARTHROPLASTY;  Surgeon: Kathryne Hitch, MD;  Location: [facility];  Service: Orthopedics;  Laterality: Right;   TOTAL SHOULDER ARTHROPLASTY Left 08/12/2014   Procedure: LEFT TOTAL SHOULDER ARTHROPLASTY;  Surgeon: Verlee Rossetti, MD;  Location: [facility];  Service: Orthopedics;  Laterality: Left;   UMBILICAL HERNIA REPAIR     Patient Active Problem List   Diagnosis Date Noted   Status post total right knee replacement 08/22/2023   Situational anxiety 08/27/2022   Post-COVID syndrome 08/13/2021   Stress at home 08/13/2021   COVID-19 07/31/2021   Diabetic neuropathy (HCC) 06/27/2021   Biochemically recurrent malignant neoplasm of prostate (HCC) 04/06/2019   Dyslipidemia 08/25/2018   Well adult exam 08/25/2018   Actinic keratoses 08/25/2018   Prostate cancer (HCC) 01/23/2017   Diabetes mellitus (HCC) 10/04/2016   Aortic stenosis 08/19/2016   Coronary artery disease 08/04/2015   Obesity 08/04/2015   Osteoarthritis of multiple joints 08/04/2015   S/P TKR (total knee replacement) 08/04/2015   S/P shoulder replacement 08/12/2014   Arthritis of shoulder region, left, degenerative 03/22/2014   History of colonic polyps 05/30/2010   Obstructive  sleep apnea 05/10/2009   Essential hypertension 05/10/2009   History of hepatitis B virus infection 05/10/2009    PCP: Plotnikov, Georgina Quint, MD   REFERRING PROVIDER: Kathryne Hitch*   REFERRING DIAG:  Diagnosis  [phone] (ICD-10-CM) - Status post total right knee replacement    THERAPY DIAG:  Acute pain of right knee  Difficulty in walking, not elsewhere classified  Muscle weakness (generalized)  Localized edema  Rationale for Evaluation and Treatment: Rehabilitation  ONSET DATE: 08/22/23: Rt TKA  SUBJECTIVE:   SUBJECTIVE STATEMENT: Rodney Hayes reports he is sore from 2 straight days of supervised PT.  He realizes this is normal and extension AROM remains a  high priority.  PERTINENT HISTORY: Rt TKA on 08/22/23, h/o left TKA 2014, biceps tenon reinsertion  See further PMH above  PAIN:  NPRS scale: 0-5/10 this week Pain location: Rt knee Pain description: achy Aggravating factors: sleeping, prolonged postures, too much WB  Relieving factors: pain meds, ice  PRECAUTIONS: None  WEIGHT BEARING RESTRICTIONS: No  FALLS:  Has patient fallen in last 6 months? 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 No periprosthetic lucency or fracture. There has been patellar resurfacing. Recent postsurgical change includes air and edema in the soft tissues and joint space. Anterior skin staples.   IMPRESSION: Right knee arthroplasty without immediate postoperative complication.  PATIENT SURVEYS:   Patient-specific activity scoring scheme   "0" represents "unable to perform." "10" represents "able to perform at prior level. 0 1 2 3 4 5 6 7 8 9  10 (Date and Score) Activity Initial  Activity 09/09/23    walk 4     2. stairs 4     3. drive 3   4.sleeping 2        TOTAL SCORE   13/4 3.25    Total score = sum of the activity scores/number of activities Minimum detectable change (90%CI) for average score = 2 points Minimum detectable change (90%CI) for single activity score = 3 points   COGNITION: Overall cognitive status: [facility]     LOWER EXTREMITY ROM:   ROM A: active, P: passive Right 09/09/23 Left 09/09/23 Right 09/16/2023 Right 09/17/2023 Right 09/24/2023  Hip flexion 108 110      Knee flexion A: 95 P:  100 A: 108 AROM supine heel slide 103 With strap 110 With strap 114  Knee extension A: -17 A: -2   -8 -6   (Blank rows = not tested)  LOWER EXTREMITY MMT:  MMT Right 09/09/23 Left 09/09/23  Hip flexion 4 5  Hip extension    Hip abduction    Hip adduction    Knee flexion 4 5  Knee extension 4 5  Ankle dorsiflexion    Ankle plantarflexion     (Blank rows = not tested)    FUNCTIONAL TESTS:  09/09/23: 5 time sit to stand: 15.25 seconds UE support  GAIT: Eval: Distance walked: clinic distances Assistive device utilized: Single point cane Level of assistance: Modified independence Comments: antalgic gait, decreased knee flexion and hip flexion, decreased knee extension in  stance phase                                                                                                                                                                        TODAY'S TREATMENT                                                                          DATE:  09/25/2023 Knee flexion with strap with quadriceps sets between reps 10 x 5 seconds each Quadriceps sets with heel prop on right 10 x 5 seconds Seated knee extension stretch 3 minutes with 5# just above incision above the right knee Prone quadriceps stretch 5 x 20 seconds  Functional Activities (sit to stand and stairs): Double Leg Press 125# 15 x full extension and stretch into flexion slow eccentrics Single leg Press Right only 62# 15 x slow eccentrics full extension and stretch into flexion Step-up and over 4 inch step with slow eccentrics 10 x Step-down off 2, 4 and 6 inch step 10 x each slow eccentrics  Neuromuscular re-education: Tandem balance 4 x 20 seconds  Vaso Medium 34* Right knee 10 minutes   09/24/2023 Recumbent bike Seat 10 for 5 minutes (extension emphasis) Level 3 Seated knee extension machine 25# up bilateral and down right only with slow eccentrics 90* to 45* 15 x Knee flexion with  strap with quadriceps sets between reps 10 x 5 seconds each Quadriceps sets with heel prop on right 10 x 5 seconds Seated knee extension stretch 3 minutes with 5# just above incision above the right knee Prone quadriceps stretch 5 x 20 seconds  Functional Activities (sit to stand and stairs): Double Leg Press 125# 15 x full extension and stretch into flexion slow eccentrics Single leg Press Right only 62# 15 x slow eccentrics full extension and stretch into flexion  Vaso Medium 34* Right knee 10 minutes   09/23/2023 Recumbent bike Seat 10 X 10 min full revolutions Gastroc stretch 30 sec X 3 on slantboard Hamstring stretch with foot on top of leg press in between DL sets and SL sets, 1 minute stretch X 2 Rt knee PROM into extension with overpressure 5 sec, 2X10   Functional Activities (sit to stand and stairs): Double Leg Press 100# 2X12 x full extension and stretch into flexion slow eccentrics Single leg Press Right only 50# 10 x slow eccentrics full extension and stretch into flexion Seated knee extension machine 20# up bilateral and down right  only with slow eccentrics 90* to 45* 15 x, then Rt leg only 15# 2X10 Step ups forward leading Rt leg 6 inch X 10 with 2 UE support Step downs leading with left leg for WB on Rt leg,  X10 with 2 UE support  Vaso Medium 34* Right knee 10 minutes   PATIENT EDUCATION:  Education details: HEP, POC Person educated: Patient Education method: Programmer, multimedia, Demonstration, Verbal cues, and Handouts Education comprehension: verbalized understanding, returned demonstration, and verbal cues required  HOME EXERCISE PROGRAM: Access Code: [email or web address] URL: [email or web address] Date: 09/09/2023 Prepared by: Narda Amber  Exercises - Seated Hamstring Stretch  - 3 x daily - 7 x weekly - 3 reps - 30 seconds hold - Seated Long Arc Quad  - 3 x daily - 7 x weekly - 2 sets - 10 reps - 3 seconds hold - Sit to Stand with Counter Support  - 3 x  daily - 7 x weekly - 2 sets - 10 reps - Seated Quad Set  - 3 x daily - 7 x weekly - 10 reps - 5-10 seconds with overpressure hold - Supine Heel Slide with Strap  - 3 x daily - 7 x weekly - 10 reps - 5 seconds hold  ASSESSMENT:  CLINICAL IMPRESSION: Rodney Hayes continues to do a great job with his HEP compliance.  Edema control, quadriceps strength and extension AROM remain high priorities.  Progressions were made for stairs and to help transition Rodney Hayes to a cane today.  Continue supervised PT to meet long-term goals.  OBJECTIVE IMPAIRMENTS: Abnormal gait, decreased balance, decreased mobility, difficulty walking, decreased ROM, decreased strength, increased edema, impaired flexibility, and pain.   ACTIVITY LIMITATIONS: lifting, bending, sitting, standing, squatting, sleeping, stairs, and transfers  PARTICIPATION LIMITATIONS: driving, community activity, occupation, and yard work  PERSONAL FACTORS: 3+ comorbidities: see PMH above  are also affecting patient's functional outcome.   REHAB POTENTIAL: Good  CLINICAL DECISION MAKING: Stable/uncomplicated  EVALUATION COMPLEXITY: Low   GOALS: Goals reviewed with patient? Yes  SHORT TERM GOALS: (target date for Short term goals are 3 weeks 09/30/2023)   1.  Patient will demonstrate independent use of home exercise program to maintain progress from in clinic treatments.  Goal status: Met 09/17/2023  LONG TERM GOALS: (target dates for all long term goals are 12 weeks  12/02/2023 )   1. Patient will demonstrate/report pain at worst less than or equal to 2/10 to facilitate minimal limitation in daily activity secondary to pain symptoms.  Goal status: On Going 09/24/2023   2. Patient will demonstrate independent use of home exercise program to facilitate ability to maintain/progress functional gains from skilled physical therapy services.  Goal status: New   3. Patient will demonstrate Patient specific functional scale avg > or = 6.25 to indicate  reduced disability due to condition.   Goal status: New   4.  Patient will demonstrate Rt  LE MMT 5/5 throughout to faciltiate usual transfers, stairs, squatting at [facility] for daily life.   Goal status: New   5.  Patient will demonstrate active ROM arc of 4 - 115 degrees in his Rt knee to improve functional mobility and gait.  Goal status: On Going 09/24/2023   6.  Pt will be able to navigate up and down 1 flight of stairs with reciprocal gait pattern with single hand rail.  Goal status: New    PLAN:  PT FREQUENCY: 2-3x/week  PT DURATION: 12 weeks  PLANNED INTERVENTIONS: Can include 28413- PT Re-evaluation, 97110-Therapeutic exercises, 97530-  Therapeutic activity, [id number]- Neuromuscular re-education, [phone]- Self Care, 60454- Manual therapy, [phone]- Gait training, [phone]- Orthotic Fit/training, [phone]- Canalith repositioning, [id number]- Aquatic Therapy, [phone]- Electrical stimulation (unattended), [phone]- Electrical stimulation (manual), [id number] Physical performance testing, [id number]- Vasopneumatic device, [id number]- Ultrasound, [id number]- Traction (mechanical), [id number]- Ionotophoresis 4mg /ml Dexamethasone, Patient/Family education, Balance training, Stair training, Taping, Dry Needling, Joint mobilization, Joint manipulation, Spinal manipulation, Spinal mobilization, Scar mobilization, Vestibular training, Visual/preceptual remediation/compensation, DME instructions, Cryotherapy, and Moist heat.  All performed as medically necessary.  All included unless contraindicated  PLAN FOR NEXT SESSION: Continued AROM gains (particularly extension), quadriceps strength and balance improvements.  Balance progressions including step strategy.  Cherlyn Cushing PT, MPT 09/25/23 1:41 PM

## 2023-09-26 ENCOUNTER — Other Ambulatory Visit: Payer: Self-pay | Admitting: Internal Medicine

## 2023-09-26 ENCOUNTER — Other Ambulatory Visit (HOSPITAL_COMMUNITY): Payer: Self-pay

## 2023-09-29 ENCOUNTER — Other Ambulatory Visit: Payer: Self-pay | Admitting: Internal Medicine

## 2023-09-29 ENCOUNTER — Other Ambulatory Visit (HOSPITAL_COMMUNITY): Payer: Self-pay

## 2023-09-29 ENCOUNTER — Ambulatory Visit: Payer: Commercial Managed Care - PPO | Admitting: Rehabilitative and Restorative Service Providers"

## 2023-09-29 ENCOUNTER — Encounter: Payer: Self-pay | Admitting: Rehabilitative and Restorative Service Providers"

## 2023-09-29 DIAGNOSIS — R262 Difficulty in walking, not elsewhere classified: Secondary | ICD-10-CM | POA: Diagnosis not present

## 2023-09-29 DIAGNOSIS — M25561 Pain in right knee: Secondary | ICD-10-CM | POA: Diagnosis not present

## 2023-09-29 DIAGNOSIS — R600 Localized edema: Secondary | ICD-10-CM

## 2023-09-29 DIAGNOSIS — M6281 Muscle weakness (generalized): Secondary | ICD-10-CM | POA: Diagnosis not present

## 2023-09-29 MED ORDER — GABAPENTIN 300 MG PO CAPS
300.0000 mg | ORAL_CAPSULE | Freq: Two times a day (BID) | ORAL | 3 refills | Status: DC
  Filled 2023-09-29 – 2023-10-01 (×3): qty 180, 90d supply, fill #0

## 2023-09-29 MED ORDER — DIAZEPAM 5 MG PO TABS
5.0000 mg | ORAL_TABLET | ORAL | 1 refills | Status: DC | PRN
  Filled 2023-09-29: qty 60, 30d supply, fill #0
  Filled 2023-11-01: qty 60, 30d supply, fill #1

## 2023-09-29 NOTE — Therapy (Signed)
 OUTPATIENT PHYSICAL THERAPY TREATMENT NOTE   Patient Name: Rodney Gladwin Kulzer Jr. MRN: [id number] DOB:18-May-1951, 73 y.o., male Today's Date: 09/29/2023  END OF SESSION:  PT End of Session - 09/29/23 1258     Visit Number 8    Number of Visits 27    Date for PT Re-Evaluation 12/02/23    PT Start Time 1258    PT Stop Time 1348    PT Time Calculation (min) 50 min    Activity Tolerance Patient tolerated treatment well    Behavior During Therapy WFL for tasks assessed/performed              Past Medical History:  Diagnosis Date   Allergy    SEASONAL   Anxiety    Arthritis    Asthma    as a child   BPH (benign prostatic hyperplasia)    Cancer (HCC)    prostatectomy 2018 Gleason 7   Cataract    BILATERAL   Coronary artery disease    Diabetes mellitus    average fasting 140s type 2   DJD (degenerative joint disease)    Heart murmur    dr. Gala Romney   Hepatitis    hep B   Hyperlipidemia    Hypertension    Neuromuscular disorder (HCC)    sciatica LLE   Obesity    Pneumonia    Sleep apnea    C-PAP   Past Surgical History:  Procedure Laterality Date   Biceps tendon reinsertion     colonoscopy with polypectomy     adenomatous; Dr Leone Payor   JOINT REPLACEMENT Left    knee   left knee arthroscopy     LYMPHADENECTOMY Bilateral 01/23/2017   Procedure: BILATERAL LYMPHADENECTOMY;  Surgeon: Heloise Purpura, MD;  Location: [facility];  Service: Urology;  Laterality: Bilateral;   PROSTATECTOMY     01/23/17 Dr. Laverle Patter   ROBOT ASSISTED LAPAROSCOPIC RADICAL PROSTATECTOMY N/A 01/23/2017   Procedure: XI ROBOTIC ASSISTED LAPAROSCOPIC RADICAL PROSTATECTOMY LEVEL 3;  Surgeon: Heloise Purpura, MD;  Location: [facility];  Service: Urology;  Laterality: N/A;   TONSILLECTOMY AND ADENOIDECTOMY     TOTAL KNEE ARTHROPLASTY Left 01/11/2013   Procedure: LEFT TOTAL KNEE ARTHROPLASTY;  Surgeon: Loanne Drilling, MD;  Location: [facility];  Service: Orthopedics;  Laterality: Left;   TOTAL KNEE  ARTHROPLASTY Right 08/22/2023   Procedure: RIGHT TOTAL KNEE ARTHROPLASTY;  Surgeon: Kathryne Hitch, MD;  Location: [facility];  Service: Orthopedics;  Laterality: Right;   TOTAL SHOULDER ARTHROPLASTY Left 08/12/2014   Procedure: LEFT TOTAL SHOULDER ARTHROPLASTY;  Surgeon: Verlee Rossetti, MD;  Location: [facility];  Service: Orthopedics;  Laterality: Left;   UMBILICAL HERNIA REPAIR     Patient Active Problem List   Diagnosis Date Noted   Status post total right knee replacement 08/22/2023   Situational anxiety 08/27/2022   Post-COVID syndrome 08/13/2021   Stress at home 08/13/2021   COVID-19 07/31/2021   Diabetic neuropathy (HCC) 06/27/2021   Biochemically recurrent malignant neoplasm of prostate (HCC) 04/06/2019   Dyslipidemia 08/25/2018   Well adult exam 08/25/2018   Actinic keratoses 08/25/2018   Prostate cancer (HCC) 01/23/2017   Diabetes mellitus (HCC) 10/04/2016   Aortic stenosis 08/19/2016   Coronary artery disease 08/04/2015   Obesity 08/04/2015   Osteoarthritis of multiple joints 08/04/2015   S/P TKR (total knee replacement) 08/04/2015   S/P shoulder replacement 08/12/2014   Arthritis of shoulder region, left, degenerative 03/22/2014   History of colonic polyps 05/30/2010   Obstructive sleep  apnea 05/10/2009   Essential hypertension 05/10/2009   History of hepatitis B virus infection 05/10/2009    PCP: Plotnikov, Georgina Quint, MD   REFERRING PROVIDER: Kathryne Hitch*   REFERRING DIAG:  Diagnosis  [phone] (ICD-10-CM) - Status post total right knee replacement    THERAPY DIAG:  Acute pain of right knee  Difficulty in walking, not elsewhere classified  Muscle weakness (generalized)  Localized edema  Rationale for Evaluation and Treatment: Rehabilitation  ONSET DATE: 08/22/23: Rt TKA  SUBJECTIVE:   SUBJECTIVE STATEMENT: Reported feeling sore today after workouts yesterday "pretty hard."  Doing some walking without cane.  Did some yard work that led to  [employer].   PERTINENT HISTORY: Rt TKA on 08/22/23, h/o left TKA 2014, biceps tenon reinsertion  See further PMH above  PAIN:  NPRS scale: at worst in last 24 hours: 2-3/10 Pain location: Rt knee Pain description: achy/tight Aggravating factors: sleeping, prolonged postures, too much WB  Relieving factors: pain meds, ice  PRECAUTIONS: None  WEIGHT BEARING RESTRICTIONS: No  FALLS:  Has patient fallen in last 6 months? 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 No periprosthetic lucency or fracture. There has been patellar resurfacing. Recent postsurgical change includes air and edema in the soft tissues and joint space. Anterior skin staples.   IMPRESSION: Right knee arthroplasty without immediate postoperative complication.  PATIENT SURVEYS:   Patient-specific activity scoring scheme   "0" represents "unable to perform." "10" represents "able to perform at prior level. 0 1 2 3 4 5 6 7 8 9  10 (Date and Score) Activity Initial  Activity 09/09/23 09/29/2023   walk 4  7  2. stairs 4  7  3. drive 3 10  4.sleeping 2 6       TOTAL SCORE   13/4 3.25 30/4  7.5   Total score = sum of the activity scores/number of activities Minimum detectable change (90%CI) for average score = 2 points Minimum detectable change (90%CI) for single activity score = 3 points   COGNITION: Overall cognitive status: [facility]     LOWER EXTREMITY ROM:   ROM A: active, P: passive Right 09/09/23 Left 09/09/23 Right 09/16/2023  Right 09/17/2023 Right 09/24/2023  Hip flexion 108 110     Knee flexion A: 95 P:  100 A: 108 AROM supine heel slide 103 With strap 110 With strap 114  Knee extension A: -17 A: -2   -8 -6   (Blank rows = not tested)  LOWER EXTREMITY MMT:  MMT Right 09/09/23 Left 09/09/23 Right 09/29/2023 Left 09/29/2023  Hip flexion 4 5    Hip extension      Hip abduction      Hip adduction      Knee flexion 4 5    Knee extension 4 5 5/5 58, 54 lbs 5/5 86, 79 lbs  Ankle dorsiflexion      Ankle plantarflexion       (Blank rows = not tested)    FUNCTIONAL TESTS:  09/29/2023: 5x sit to stand to sit :  18.63 seconds without  UE assist.   09/09/23: 5 time sit to stand: 15.25 seconds UE support  GAIT: 09/29/2023: Independent ambulation able in clinic on level surfaces.  At times, reduced step length noted with Rt leg.     Eval: Distance walked: clinic distances Assistive device utilized: Single point cane Level of assistance: Modified independence Comments: antalgic gait, decreased knee flexion and hip flexion, decreased knee extension in stance phase                                                                                                                                                                        TODAY'S TREATMENT                                                                          DATE: 09/29/2023 Therex: Recumbent bike Lvl 2 seat 10, 10 mins  Seated Rt leg LAQ AROM with end range pauses each direction x 15 Heel prop to tolerance for extension stretch of knee 5 mins   TherActivity Lateral step down 6 inch step 2 x 10  Step on over and down 4 inch step with single hand rail assist x 15 Sit to stand to sit 18 inch chair x 5  Neuro Re-ed Tandem stance on foam 1 min x 2 bilateral with occasional HHA Feet together stance ankle strategy [employer] fwd/back on foam 2 x 20   Vasopneumatic 10 mins 34 deg medium compression in supine elevated LE.   TODAY'S TREATMENT                                                                           DATE: 09/25/2023 Knee flexion with strap with quadriceps sets between reps 10 x 5 seconds each Quadriceps sets with heel prop on right 10 x 5 seconds Seated knee extension stretch 3 minutes with 5# just above incision above the right knee Prone quadriceps stretch 5 x 20 seconds  Functional Activities (sit to stand and stairs): Double Leg Press 125# 15 x full extension and stretch into flexion slow eccentrics Single leg Press Right only 62# 15 x slow eccentrics full extension and stretch into flexion Step-up and over 4 inch step with slow  eccentrics 10 x Step-down off 2, 4 and 6 inch step 10 x each slow eccentrics  Neuromuscular re-education: Tandem balance 4 x 20 seconds  Vaso Medium 34* Right knee 10 minutes   TODAY'S TREATMENT                                                                          DATE:09/24/2023 Recumbent bike Seat 10 for 5 minutes (extension emphasis) Level 3 Seated knee extension machine 25# up bilateral and down right only with slow eccentrics 90* to 45* 15 x Knee flexion with strap with quadriceps sets between reps 10 x 5 seconds each Quadriceps sets with heel prop on right 10 x 5 seconds Seated knee extension stretch 3 minutes with 5# just above incision above the right knee Prone quadriceps stretch 5 x 20 seconds  Functional Activities (sit to stand and stairs): Double Leg Press 125# 15 x full extension and stretch into flexion slow eccentrics Single leg Press Right only 62# 15 x slow eccentrics full extension and stretch into flexion  Vaso Medium 34* Right knee 10 minutes  PATIENT EDUCATION:  Education details: HEP, POC Person educated: Patient Education method: Programmer, multimedia, Demonstration, Verbal cues, and Handouts Education comprehension: verbalized understanding, returned demonstration, and verbal cues required  HOME EXERCISE PROGRAM: Access Code: [email or web address] URL:  [email or web address] Date: 09/09/2023 Prepared by: Narda Amber  Exercises - Seated Hamstring Stretch  - 3 x daily - 7 x weekly - 3 reps - 30 seconds hold - Seated Long Arc Quad  - 3 x daily - 7 x weekly - 2 sets - 10 reps - 3 seconds hold - Sit to Stand with Counter Support  - 3 x daily - 7 x weekly - 2 sets - 10 reps - Seated Quad Set  - 3 x daily - 7 x weekly - 10 reps - 5-10 seconds with overpressure hold - Supine Heel Slide with Strap  - 3 x daily - 7 x weekly - 10 reps - 5 seconds hold  ASSESSMENT:  CLINICAL IMPRESSION: Good overall strength testing noted today as well as PSFS improvement.  Rt quad dynamometry still lacking compared to Lt despite overall good gains to this point.  Continued strenghtening, balance and extension gains to help continue progress towards goals.   OBJECTIVE IMPAIRMENTS: Abnormal gait, decreased balance, decreased mobility, difficulty walking, decreased ROM, decreased strength, increased edema, impaired flexibility, and pain.   ACTIVITY LIMITATIONS: lifting, bending, sitting, standing, squatting, sleeping, stairs, and transfers  PARTICIPATION LIMITATIONS: driving, community activity, occupation, and yard work  PERSONAL FACTORS: 3+ comorbidities: see PMH above  are also affecting patient's functional outcome.   REHAB POTENTIAL: Good  CLINICAL DECISION MAKING: Stable/uncomplicated  EVALUATION COMPLEXITY: Low   GOALS: Goals reviewed with patient? Yes  SHORT TERM GOALS: (target date for Short term goals are 3 weeks 09/30/2023)   1.  Patient will demonstrate independent use of home exercise program to maintain progress from in clinic treatments.  Goal status: Met 09/17/2023  LONG TERM GOALS: (target dates for all long term goals are 12 weeks  12/02/2023 )   1. Patient will demonstrate/report pain at worst less than or equal to 2/10 to facilitate minimal limitation in daily activity secondary to pain symptoms.  Goal status: on going  09/29/2023   2. Patient will demonstrate independent use of home exercise program to facilitate ability to maintain/progress functional gains from skilled physical therapy services.  Goal status: on going 09/29/2023   3. Patient will demonstrate Patient specific functional scale avg > or = 6.25 to indicate reduced disability due to condition.   Goal status: Met 09/29/2023   4.  Patient will demonstrate Rt  LE MMT 5/5 throughout to faciltiate usual transfers, stairs, squatting at [facility] for daily life.   Goal status: on going 09/29/2023   5.  Patient will demonstrate active ROM arc of 4 - 115 degrees in his Rt knee to improve functional mobility and gait.  Goal status: On Going 09/24/2023   6.  Pt will be able to navigate up and down 1 flight of stairs with reciprocal gait pattern with single hand rail.  Goal status: on going 09/29/2023    PLAN:  PT FREQUENCY: 2-3x/week  PT DURATION: 12 weeks  PLANNED INTERVENTIONS: Can include 16109- PT Re-evaluation, 97110-Therapeutic exercises, 97530- Therapeutic activity, 97112- Neuromuscular re-education, 97535- Self Care, 97140- Manual therapy, [phone]- Gait training, [phone]- Orthotic Fit/training, [phone]- Canalith repositioning, [id number]- Aquatic Therapy, [phone]- Electrical stimulation (unattended), [phone]- Electrical stimulation (manual), [id number] Physical performance testing, 97016- Vasopneumatic device, [id number]- Ultrasound, [id number]- Traction (mechanical), [id number]- Ionotophoresis 4mg /ml Dexamethasone, Patient/Family education, Balance training, Stair training, Taping, Dry Needling, Joint mobilization, Joint manipulation, Spinal manipulation, Spinal mobilization, Scar mobilization, Vestibular training, Visual/preceptual remediation/compensation, DME instructions, Cryotherapy, and Moist heat.  All performed as medically necessary.  All included unless contraindicated  PLAN FOR NEXT SESSION: Dynamic balance, compliant surface balance.  Extension gains.   Chyrel Masson,  PT, DPT, OCS, ATC 09/29/23  1:39 PM

## 2023-09-30 ENCOUNTER — Other Ambulatory Visit (HOSPITAL_COMMUNITY): Payer: Self-pay

## 2023-09-30 ENCOUNTER — Other Ambulatory Visit: Payer: Self-pay

## 2023-10-01 ENCOUNTER — Ambulatory Visit: Payer: Commercial Managed Care - PPO | Admitting: Physical Therapy

## 2023-10-01 ENCOUNTER — Other Ambulatory Visit: Payer: Self-pay | Admitting: Internal Medicine

## 2023-10-01 ENCOUNTER — Encounter: Payer: Self-pay | Admitting: Physical Therapy

## 2023-10-01 ENCOUNTER — Other Ambulatory Visit (HOSPITAL_COMMUNITY): Payer: Self-pay

## 2023-10-01 DIAGNOSIS — M6281 Muscle weakness (generalized): Secondary | ICD-10-CM

## 2023-10-01 DIAGNOSIS — R600 Localized edema: Secondary | ICD-10-CM

## 2023-10-01 DIAGNOSIS — M25561 Pain in right knee: Secondary | ICD-10-CM

## 2023-10-01 DIAGNOSIS — R262 Difficulty in walking, not elsewhere classified: Secondary | ICD-10-CM | POA: Diagnosis not present

## 2023-10-01 MED ORDER — GABAPENTIN 300 MG PO CAPS
300.0000 mg | ORAL_CAPSULE | Freq: Four times a day (QID) | ORAL | 3 refills | Status: AC
  Filled 2023-10-01 (×2): qty 360, 90d supply, fill #0
  Filled 2023-12-25: qty 360, 90d supply, fill #1
  Filled 2024-03-24: qty 360, 90d supply, fill #2
  Filled 2024-03-24: qty 360, 90d supply, fill #0

## 2023-10-01 NOTE — Therapy (Signed)
 OUTPATIENT PHYSICAL THERAPY TREATMENT NOTE   Patient Name: Rodney Harville Runco Jr. MRN: [id number] DOB:30-Jun-1951, 73 y.o., male Today's Date: 10/01/2023  END OF SESSION:  PT End of Session - 10/01/23 1339     Visit Number 9    Number of Visits 27    Date for PT Re-Evaluation 12/02/23    PT Start Time 1300    PT Stop Time 1346    PT Time Calculation (min) 46 min    Activity Tolerance Patient tolerated treatment well    Behavior During Therapy WFL for tasks assessed/performed               Past Medical History:  Diagnosis Date   Allergy    SEASONAL   Anxiety    Arthritis    Asthma    as a child   BPH (benign prostatic hyperplasia)    Cancer (HCC)    prostatectomy 2018 Gleason 7   Cataract    BILATERAL   Coronary artery disease    Diabetes mellitus    average fasting 140s type 2   DJD (degenerative joint disease)    Heart murmur    dr. Gala Romney   Hepatitis    hep B   Hyperlipidemia    Hypertension    Neuromuscular disorder (HCC)    sciatica LLE   Obesity    Pneumonia    Sleep apnea    C-PAP   Past Surgical History:  Procedure Laterality Date   Biceps tendon reinsertion     colonoscopy with polypectomy     adenomatous; Dr Leone Payor   JOINT REPLACEMENT Left    knee   left knee arthroscopy     LYMPHADENECTOMY Bilateral 01/23/2017   Procedure: BILATERAL LYMPHADENECTOMY;  Surgeon: Heloise Purpura, MD;  Location: [facility];  Service: Urology;  Laterality: Bilateral;   PROSTATECTOMY     01/23/17 Dr. Laverle Patter   ROBOT ASSISTED LAPAROSCOPIC RADICAL PROSTATECTOMY N/A 01/23/2017   Procedure: XI ROBOTIC ASSISTED LAPAROSCOPIC RADICAL PROSTATECTOMY LEVEL 3;  Surgeon: Heloise Purpura, MD;  Location: [facility];  Service: Urology;  Laterality: N/A;   TONSILLECTOMY AND ADENOIDECTOMY     TOTAL KNEE ARTHROPLASTY Left 01/11/2013   Procedure: LEFT TOTAL KNEE ARTHROPLASTY;  Surgeon: Loanne Drilling, MD;  Location: [facility];  Service: Orthopedics;  Laterality: Left;   TOTAL KNEE  ARTHROPLASTY Right 08/22/2023   Procedure: RIGHT TOTAL KNEE ARTHROPLASTY;  Surgeon: Kathryne Hitch, MD;  Location: [facility];  Service: Orthopedics;  Laterality: Right;   TOTAL SHOULDER ARTHROPLASTY Left 08/12/2014   Procedure: LEFT TOTAL SHOULDER ARTHROPLASTY;  Surgeon: Verlee Rossetti, MD;  Location: [facility];  Service: Orthopedics;  Laterality: Left;   UMBILICAL HERNIA REPAIR     Patient Active Problem List   Diagnosis Date Noted   Status post total right knee replacement 08/22/2023   Situational anxiety 08/27/2022   Post-COVID syndrome 08/13/2021   Stress at home 08/13/2021   COVID-19 07/31/2021   Diabetic neuropathy (HCC) 06/27/2021   Biochemically recurrent malignant neoplasm of prostate (HCC) 04/06/2019   Dyslipidemia 08/25/2018   Well adult exam 08/25/2018   Actinic keratoses 08/25/2018   Prostate cancer (HCC) 01/23/2017   Diabetes mellitus (HCC) 10/04/2016   Aortic stenosis 08/19/2016   Coronary artery disease 08/04/2015   Obesity 08/04/2015   Osteoarthritis of multiple joints 08/04/2015   S/P TKR (total knee replacement) 08/04/2015   S/P shoulder replacement 08/12/2014   Arthritis of shoulder region, left, degenerative 03/22/2014   History of colonic polyps 05/30/2010   Obstructive  sleep apnea 05/10/2009   Essential hypertension 05/10/2009   History of hepatitis B virus infection 05/10/2009    PCP: Plotnikov, Georgina Quint, MD   REFERRING PROVIDER: Kathryne Hitch*   REFERRING DIAG:  Diagnosis  [phone] (ICD-10-CM) - Status post total right knee replacement    THERAPY DIAG:  Acute pain of right knee  Difficulty in walking, not elsewhere classified  Muscle weakness (generalized)  Localized edema  Rationale for Evaluation and Treatment: Rehabilitation  ONSET DATE: 08/22/23: Rt TKA  SUBJECTIVE:   SUBJECTIVE STATEMENT: Reports went to the gym on his own and did hard workout so some soreness from this  PERTINENT HISTORY: Rt TKA on 08/22/23, h/o left  TKA 2014, biceps tenon reinsertion  See further PMH above  PAIN:  NPRS scale: 2/10 Pain location: Rt knee Pain description: achy/tight Aggravating factors: sleeping, prolonged postures, too much WB  Relieving factors: pain meds, ice  PRECAUTIONS: None  WEIGHT BEARING RESTRICTIONS: No  FALLS:  Has patient fallen in last 6 months? 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 No periprosthetic lucency or fracture. There has been patellar resurfacing. Recent postsurgical change includes air and edema in the soft tissues and joint space. Anterior skin staples.   IMPRESSION: Right knee arthroplasty without immediate postoperative complication.  PATIENT SURVEYS:   Patient-specific activity scoring scheme   "0" represents "unable to perform." "10" represents "able to perform at prior level. 0 1 2 3 4 5 6 7 8 9  10 (Date and Score) Activity Initial  Activity 09/09/23 09/29/2023   walk 4  7  2. stairs 4  7  3. drive 3 10  4.sleeping 2 6       TOTAL SCORE   13/4 3.25 30/4  7.5   Total score = sum of the activity scores/number of activities Minimum detectable change (90%CI) for average score = 2 points Minimum detectable change (90%CI) for single activity score = 3 points   COGNITION: Overall cognitive status: [facility]     LOWER EXTREMITY ROM:   ROM A: active, P: passive Right 09/09/23 Left 09/09/23 Right 09/16/2023 Right 09/17/2023 Right 09/24/2023  Hip flexion 108 110     Knee flexion A: 95 P:  100 A: 108 AROM  supine heel slide 103 With strap 110 With strap 114  Knee extension A: -17 A: -2   -8 -6   (Blank rows = not tested)  LOWER EXTREMITY MMT:  MMT Right 09/09/23 Left 09/09/23 Right 09/29/2023 Left 09/29/2023  Hip flexion 4 5    Hip extension      Hip abduction      Hip adduction      Knee flexion 4 5    Knee extension 4 5 5/5 58, 54 lbs 5/5 86, 79 lbs  Ankle dorsiflexion      Ankle plantarflexion       (Blank rows = not tested)    FUNCTIONAL TESTS:  09/29/2023: 5x sit to stand to sit :  18.63 seconds without UE assist.   09/09/23: 5 time sit to stand: 15.25 seconds UE  support  GAIT: 09/29/2023: Independent ambulation able in clinic on level surfaces.  At times, reduced step length noted with Rt leg.     Eval: Distance walked: clinic distances Assistive device utilized: Single point cane Level of assistance: Modified independence Comments: antalgic gait, decreased knee flexion and hip flexion, decreased knee extension in stance phase                                                                                                                                                                        TODAY'S TREATMENT                                                                          DATE:  10/01/2023 Therex: Recumbent bike Lvl 2 seat 10, 10 mins  Seated Rt leg LAQ 5# 2X10 Heel prop to tolerance for extension stretch of knee 5 mins   Functional Activities (sit to stand and stairs): Sit to stands no UE support 2X10 Forward Step-up and over 6 inch step with slow eccentrics 10 x Lateral step up and over 6 inch step X 10 bilat Tandem walk 3 round trips in bars without UE support Walking with head turns 3 round trips in bars without UE support Walking with head nods 3 round trips in bars without UE support  Vasopneumatic 10 mins 34 deg medium compression in supine elevated LE.   09/29/2023 Therex: Recumbent bike Lvl 2 seat 10, 10 mins  Seated Rt leg LAQ AROM with  end range pauses each direction x 15 Heel prop to tolerance for extension stretch of knee 5 mins   TherActivity Lateral step down 6 inch step 2 x 10  Step on over and down 4 inch step with single hand rail assist x 15 Sit to stand to sit 18 inch chair x 5  Neuro Re-ed Tandem stance on foam 1 min x 2 bilateral with occasional HHA Feet together stance ankle strategy [employer] fwd/back on foam 2 x 20   Vasopneumatic 10 mins 34 deg medium compression in supine elevated LE.   TODAY'S TREATMENT  DATE: 09/25/2023 Knee flexion with strap with quadriceps sets between reps 10 x 5 seconds each Quadriceps sets with heel prop on right 10 x 5 seconds Seated knee extension stretch 3 minutes with 5# just above incision above the right knee Prone quadriceps stretch 5 x 20 seconds  Functional Activities (sit to stand and stairs): Double Leg Press 125# 15 x full extension and stretch into flexion slow eccentrics Single leg Press Right only 62# 15 x slow eccentrics full extension and stretch into flexion Step-up and over 4 inch step with slow eccentrics 10 x Step-down off 2, 4 and 6 inch step 10 x each slow eccentrics  Neuromuscular re-education: Tandem balance 4 x 20 seconds  Vaso Medium 34* Right knee 10 minutes   TODAY'S TREATMENT                                                                          DATE:09/24/2023 Recumbent bike Seat 10 for 5 minutes (extension emphasis) Level 3 Seated knee extension machine 25# up bilateral and down right only with slow eccentrics 90* to 45* 15 x Knee flexion with strap with quadriceps sets between reps 10 x 5 seconds each Quadriceps sets with heel prop on right 10 x 5 seconds Seated knee extension stretch 3 minutes with 5# just above incision above the right knee Prone quadriceps stretch 5 x 20 seconds  Functional Activities (sit to stand and stairs): Double Leg Press 125# 15 x full  extension and stretch into flexion slow eccentrics Single leg Press Right only 62# 15 x slow eccentrics full extension and stretch into flexion  Vaso Medium 34* Right knee 10 minutes  PATIENT EDUCATION:  Education details: HEP, POC Person educated: Patient Education method: Programmer, multimedia, Demonstration, Verbal cues, and Handouts Education comprehension: verbalized understanding, returned demonstration, and verbal cues required  HOME EXERCISE PROGRAM: Access Code: [email or web address] URL: [email or web address] Date: 09/09/2023 Prepared by: Narda Amber  Exercises - Seated Hamstring Stretch  - 3 x daily - 7 x weekly - 3 reps - 30 seconds hold - Seated Long Arc Quad  - 3 x daily - 7 x weekly - 2 sets - 10 reps - 3 seconds hold - Sit to Stand with Counter Support  - 3 x daily - 7 x weekly - 2 sets - 10 reps - Seated Quad Set  - 3 x daily - 7 x weekly - 10 reps - 5-10 seconds with overpressure hold - Supine Heel Slide with Strap  - 3 x daily - 7 x weekly - 10 reps - 5 seconds hold  ASSESSMENT:  CLINICAL IMPRESSION: He is doing quite well but will continue to benefit from further ROM, strengthening, and functional balance activities to improve overall function.   OBJECTIVE IMPAIRMENTS: Abnormal gait, decreased balance, decreased mobility, difficulty walking, decreased ROM, decreased strength, increased edema, impaired flexibility, and pain.   ACTIVITY LIMITATIONS: lifting, bending, sitting, standing, squatting, sleeping, stairs, and transfers  PARTICIPATION LIMITATIONS: driving, community activity, occupation, and yard work  PERSONAL FACTORS: 3+ comorbidities: see PMH above  are also affecting patient's functional outcome.   REHAB POTENTIAL: Good  CLINICAL DECISION MAKING: Stable/uncomplicated  EVALUATION COMPLEXITY: Low   GOALS: Goals reviewed with patient? Yes  SHORT TERM GOALS: (target  date for Short term goals are 3 weeks 09/30/2023)   1.  Patient will demonstrate  independent use of home exercise program to maintain progress from in clinic treatments.  Goal status: Met 09/17/2023  LONG TERM GOALS: (target dates for all long term goals are 12 weeks  12/02/2023 )   1. Patient will demonstrate/report pain at worst less than or equal to 2/10 to facilitate minimal limitation in daily activity secondary to pain symptoms.  Goal status: on going 09/29/2023   2. Patient will demonstrate independent use of home exercise program to facilitate ability to maintain/progress functional gains from skilled physical therapy services.  Goal status: on going 09/29/2023   3. Patient will demonstrate Patient specific functional scale avg > or = 6.25 to indicate reduced disability due to condition.   Goal status: Met 09/29/2023   4.  Patient will demonstrate Rt  LE MMT 5/5 throughout to faciltiate usual transfers, stairs, squatting at [facility] for daily life.   Goal status: on going 09/29/2023   5.  Patient will demonstrate active ROM arc of 4 - 115 degrees in his Rt knee to improve functional mobility and gait.  Goal status: On Going 09/24/2023   6.  Pt will be able to navigate up and down 1 flight of stairs with reciprocal gait pattern with single hand rail.  Goal status: on going 09/29/2023    PLAN:  PT FREQUENCY: 2-3x/week  PT DURATION: 12 weeks  PLANNED INTERVENTIONS: Can include 40981- PT Re-evaluation, 97110-Therapeutic exercises, 97530- Therapeutic activity, 97112- Neuromuscular re-education, 97535- Self Care, 97140- Manual therapy, [phone]- Gait training, [phone]- Orthotic Fit/training, [phone]- Canalith repositioning, [id number]- Aquatic Therapy, [phone]- Electrical stimulation (unattended), [phone]- Electrical stimulation (manual), [id number] Physical performance testing, 97016- Vasopneumatic device, [id number]- Ultrasound, [id number]- Traction (mechanical), [id number]- Ionotophoresis 4mg /ml Dexamethasone, Patient/Family education, Balance training, Stair training, Taping, Dry Needling, Joint  mobilization, Joint manipulation, Spinal manipulation, Spinal mobilization, Scar mobilization, Vestibular training, Visual/preceptual remediation/compensation, DME instructions, Cryotherapy, and Moist heat.  All performed as medically necessary.  All included unless contraindicated  PLAN FOR NEXT SESSION: Dynamic balance, compliant surface balance.  Extension gains.   Ivery Quale, PT, DPT 10/01/23 1:40 PM

## 2023-10-03 ENCOUNTER — Ambulatory Visit: Payer: Commercial Managed Care - PPO | Admitting: Rehabilitative and Restorative Service Providers"

## 2023-10-03 ENCOUNTER — Encounter: Payer: Self-pay | Admitting: Rehabilitative and Restorative Service Providers"

## 2023-10-03 DIAGNOSIS — M25561 Pain in right knee: Secondary | ICD-10-CM | POA: Diagnosis not present

## 2023-10-03 DIAGNOSIS — R262 Difficulty in walking, not elsewhere classified: Secondary | ICD-10-CM

## 2023-10-03 DIAGNOSIS — M6281 Muscle weakness (generalized): Secondary | ICD-10-CM | POA: Diagnosis not present

## 2023-10-03 DIAGNOSIS — R600 Localized edema: Secondary | ICD-10-CM | POA: Diagnosis not present

## 2023-10-03 NOTE — Therapy (Signed)
 OUTPATIENT PHYSICAL THERAPY TREATMENT NOTE   Patient Name: Rodney Berns Mckeough Jr. MRN: [id number] DOB:1951/06/29, 73 y.o., male Today's Date: 10/03/2023  END OF SESSION:  PT End of Session - 10/03/23 1344     Visit Number 10    Number of Visits 27    Date for PT Re-Evaluation 12/02/23    PT Start Time 1344    PT Stop Time 1441    PT Time Calculation (min) 57 min    Activity Tolerance Patient tolerated treatment well;No increased pain;Patient limited by pain    Behavior During Therapy [facility] for tasks assessed/performed             Past Medical History:  Diagnosis Date   Allergy    SEASONAL   Anxiety    Arthritis    Asthma    as a child   BPH (benign prostatic hyperplasia)    Cancer (HCC)    prostatectomy 2018 Gleason 7   Cataract    BILATERAL   Coronary artery disease    Diabetes mellitus    average fasting 140s type 2   DJD (degenerative joint disease)    Heart murmur    dr. Gala Romney   Hepatitis    hep B   Hyperlipidemia    Hypertension    Neuromuscular disorder (HCC)    sciatica LLE   Obesity    Pneumonia    Sleep apnea    C-PAP   Past Surgical History:  Procedure Laterality Date   Biceps tendon reinsertion     colonoscopy with polypectomy     adenomatous; Dr Leone Payor   JOINT REPLACEMENT Left    knee   left knee arthroscopy     LYMPHADENECTOMY Bilateral 01/23/2017   Procedure: BILATERAL LYMPHADENECTOMY;  Surgeon: Heloise Purpura, MD;  Location: [facility];  Service: Urology;  Laterality: Bilateral;   PROSTATECTOMY     01/23/17 Dr. Laverle Patter   ROBOT ASSISTED LAPAROSCOPIC RADICAL PROSTATECTOMY N/A 01/23/2017   Procedure: XI ROBOTIC ASSISTED LAPAROSCOPIC RADICAL PROSTATECTOMY LEVEL 3;  Surgeon: Heloise Purpura, MD;  Location: [facility];  Service: Urology;  Laterality: N/A;   TONSILLECTOMY AND ADENOIDECTOMY     TOTAL KNEE ARTHROPLASTY Left 01/11/2013   Procedure: LEFT TOTAL KNEE ARTHROPLASTY;  Surgeon: Loanne Drilling, MD;  Location: [facility];  Service: Orthopedics;   Laterality: Left;   TOTAL KNEE ARTHROPLASTY Right 08/22/2023   Procedure: RIGHT TOTAL KNEE ARTHROPLASTY;  Surgeon: Kathryne Hitch, MD;  Location: [facility];  Service: Orthopedics;  Laterality: Right;   TOTAL SHOULDER ARTHROPLASTY Left 08/12/2014   Procedure: LEFT TOTAL SHOULDER ARTHROPLASTY;  Surgeon: Verlee Rossetti, MD;  Location: [facility];  Service: Orthopedics;  Laterality: Left;   UMBILICAL HERNIA REPAIR     Patient Active Problem List   Diagnosis Date Noted   Status post total right knee replacement 08/22/2023   Situational anxiety 08/27/2022   Post-COVID syndrome 08/13/2021   Stress at home 08/13/2021   COVID-19 07/31/2021   Diabetic neuropathy (HCC) 06/27/2021   Biochemically recurrent malignant neoplasm of prostate (HCC) 04/06/2019   Dyslipidemia 08/25/2018   Well adult exam 08/25/2018   Actinic keratoses 08/25/2018   Prostate cancer (HCC) 01/23/2017   Diabetes mellitus (HCC) 10/04/2016   Aortic stenosis 08/19/2016   Coronary artery disease 08/04/2015   Obesity 08/04/2015   Osteoarthritis of multiple joints 08/04/2015   S/P TKR (total knee replacement) 08/04/2015   S/P shoulder replacement 08/12/2014   Arthritis of shoulder region, left, degenerative 03/22/2014   History of colonic polyps 05/30/2010  Obstructive sleep apnea 05/10/2009   Essential hypertension 05/10/2009   History of hepatitis B virus infection 05/10/2009    PCP: Plotnikov, Georgina Quint, MD   REFERRING PROVIDER: Kathryne Hitch*   REFERRING DIAG:  Diagnosis  [phone] (ICD-10-CM) - Status post total right knee replacement    THERAPY DIAG:  Acute pain of right knee  Difficulty in walking, not elsewhere classified  Muscle weakness (generalized)  Localized edema  Rationale for Evaluation and Treatment: Rehabilitation  ONSET DATE: 08/22/23: Rt TKA  SUBJECTIVE:   SUBJECTIVE STATEMENT: Rodney Hayes continues to do a great job with his exercises at home and the gym.  He wants to work on more  balance specific exercises today.  PERTINENT HISTORY: Rt TKA on 08/22/23, h/o left TKA 2014, biceps tenon reinsertion  See further PMH above  PAIN:  NPRS scale: 1-4/10 this week Pain location: Rt knee Pain description: achy/tight Aggravating factors: sleeping, prolonged postures, too much WB  Relieving factors: pain meds, ice  PRECAUTIONS: None  WEIGHT BEARING RESTRICTIONS: No  FALLS:  Has patient fallen in last 6 months? 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 No periprosthetic lucency or fracture. There has been patellar resurfacing. Recent postsurgical change includes air and edema in the soft tissues and joint space. Anterior skin staples.   IMPRESSION: Right knee arthroplasty without immediate postoperative complication.  PATIENT SURVEYS:   Patient-specific activity scoring scheme   "0" represents "unable to perform." "10" represents "able to perform at prior level. 0 1 2 3 4 5 6 7 8 9  10 (Date and Score) Activity Initial  Activity 09/09/23 09/29/2023  10/03/2023  walk 4  7 8.5  2. stairs 4  7 10   3. drive 3 10 10   4.sleeping 2 6 6.5        TOTAL SCORE   13/4 3.25 30/4  7.5 35/40   Total score = sum of the activity scores/number of activities Minimum detectable change (90%CI) for average score = 2 points Minimum detectable change (90%CI) for single activity score = 3 points   COGNITION: Overall cognitive status: [facility]     LOWER EXTREMITY ROM:   ROM A: active, P: passive Right 09/09/23  Left 09/09/23 Right 09/16/2023 Right 09/17/2023 Right 09/24/2023 Right 10/03/2023  Hip flexion 108 110      Knee flexion A: 95 P:  100 A: 108 AROM supine heel slide 103 With strap 110 With strap 114 116  Knee extension A: -17 A: -2   -8 -6 -6   (Blank rows = not tested)  LOWER EXTREMITY MMT:  MMT Right 09/09/23 Left 09/09/23 Right 09/29/2023 Left 09/29/2023  Hip flexion 4 5    Hip extension      Hip abduction      Hip adduction      Knee flexion 4 5    Knee extension 4 5 5/5 58, 54 lbs 5/5 86, 79 lbs  Ankle dorsiflexion      Ankle plantarflexion       (Blank rows = not tested)    FUNCTIONAL TESTS:  09/29/2023:  5x sit to stand to sit :  18.63 seconds without UE assist.   09/09/23: 5 time sit to stand: 15.25 seconds UE support  GAIT: 09/29/2023: Independent ambulation able in clinic on level surfaces.  At times, reduced step length noted with Rt leg.     Eval: Distance walked: clinic distances Assistive device utilized: Single point cane Level of assistance: Modified independence Comments: antalgic gait, decreased knee flexion and hip flexion, decreased knee extension in stance phase                                                                                                                                                                        TODAY'S TREATMENT                                                                          DATE:  10/03/2023 Recumbent bike Seat 10 60-80 RPM for 8 minutes at level 4  Functional Activities (sit to stand and stairs): Double Leg Press 150# 15 x full extension and stretch into flexion slow eccentrics Single leg Press Right only 68# 15 x slow eccentrics full extension and stretch into flexion Dynamic tandem walk on foam 6 laps Dynamic balance with obstacle course 4 and 6 inch step and 2 hurdles  Neuromuscular re-education: Tandem balance 4 x 20 seconds on foam Feet together on foam eyes open; head turning; eyes closed; head turning  eyes closed  Vaso Medium 34* Right knee 10 minutes   10/01/2023 Therex: Recumbent bike Lvl 2 seat 10, 10 mins  Seated Rt leg LAQ 5# 2X10 Heel prop to tolerance for extension stretch of knee 5 mins   Functional Activities (sit to stand and stairs): Sit to stands no UE support 2X10 Forward Step-up and over 6 inch step with slow eccentrics 10 x Lateral step up and over 6 inch step X 10 bilat Tandem walk 3 round trips in bars without UE support Walking with head turns 3 round trips in bars without UE support Walking with head nods 3 round trips in bars without UE support  Vasopneumatic 10 mins 34 deg medium compression in supine elevated LE.    09/29/2023 Therex: Recumbent bike Lvl 2 seat 10, 10 mins  Seated Rt leg LAQ AROM with end range pauses each direction x 15 Heel prop to tolerance for extension stretch of knee 5 mins   TherActivity Lateral step down 6 inch step 2 x 10  Step on over and  down 4 inch step with single hand rail assist x 15 Sit to stand to sit 18 inch chair x 5  Neuro Re-ed Tandem stance on foam 1 min x 2 bilateral with occasional HHA Feet together stance ankle strategy [employer] fwd/back on foam 2 x 20   Vasopneumatic 10 mins 34 deg medium compression in supine elevated LE.   PATIENT EDUCATION:  Education details: HEP, POC Person educated: Patient Education method: Programmer, multimedia, Demonstration, Verbal cues, and Handouts Education comprehension: verbalized understanding, returned demonstration, and verbal cues required  HOME EXERCISE PROGRAM: Access Code: [email or web address] URL: [email or web address] Date: 10/03/2023 Prepared by: Pauletta Browns  Exercises - Seated Long Arc Quad  - 3 x daily - 7 x weekly - 2 sets - 10 reps - 3 seconds hold - Sit to Stand with Counter Support  - 3 x daily - 7 x weekly - 2 sets - 10 reps - Seated Quad Set  - 3 x daily - 7 x weekly - 10 reps - 5-10 seconds with overpressure hold - Supine Heel Slide with Strap  - 3 x  daily - 7 x weekly - 10 reps - 5 seconds hold - Seated Passive Knee Extension  - 2 x daily - 7 x weekly - 1 sets - 1 reps - 3-10 minutes hold - Small Range Straight Leg Raise  - 2 x daily - 7 x weekly - 3-5 sets - 5 reps - 3 seconds hold  ASSESSMENT:  CLINICAL IMPRESSION: Rodney Hayes is making very good progress towards long-term goals established at evaluation.  AROM was 6 - 0 - 116 degrees today.  Quadriceps strength is good based on functional assessment and objective measures at his last visit.  Rodney Hayes did a good job with balance and functional activities today and will benefit from a few more visits to make sure he is comfortable walking without an assistive device and comfortable with his long-term home exercise program.  Rodney Hayes appears on track to meet long-term goals within the recommended plan of care.    OBJECTIVE IMPAIRMENTS: Abnormal gait, decreased balance, decreased mobility, difficulty walking, decreased ROM, decreased strength, increased edema, impaired flexibility, and pain.   ACTIVITY LIMITATIONS: lifting, bending, sitting, standing, squatting, sleeping, stairs, and transfers  PARTICIPATION LIMITATIONS: driving, community activity, occupation, and yard work  PERSONAL FACTORS: 3+ comorbidities: see PMH above  are also affecting patient's functional outcome.   REHAB POTENTIAL: Good  CLINICAL DECISION MAKING: Stable/uncomplicated  EVALUATION COMPLEXITY: Low   GOALS: Goals reviewed with patient? Yes  SHORT TERM GOALS: (target date for Short term goals are 3 weeks 09/30/2023)   1.  Patient will demonstrate independent use of home exercise program to maintain progress from in clinic treatments.  Goal status: Met 09/17/2023  LONG TERM GOALS: (target dates for all long term goals are 12 weeks  12/02/2023 )   1. Patient will demonstrate/report pain at worst less than or equal to 2/10 to facilitate minimal limitation in daily activity secondary to pain symptoms.  Goal status: On  going 10/03/2023   2. Patient will demonstrate independent use of home exercise program to facilitate ability to maintain/progress functional gains from skilled physical therapy services.  Goal status: Met 10/03/2023   3. Patient will demonstrate Patient specific functional scale avg > or = 6.25 to indicate reduced disability due to condition.   Goal status: Met 09/29/2023   4.  Patient will demonstrate Rt  LE MMT 5/5 throughout to faciltiate usual transfers, stairs, squatting at [facility] for daily life.  Goal status: On going 10/03/2023   5.  Patient will demonstrate active ROM arc of 4 - 115 degrees in his Rt knee to improve functional mobility and gait.  Goal status: Partially met 10/03/2023   6.  Pt will be able to navigate up and down 1 flight of stairs with reciprocal gait pattern with single hand rail.  Goal status: Met 10/03/2023    PLAN:  PT FREQUENCY: 2-3x/week  PT DURATION: 12 weeks  PLANNED INTERVENTIONS: Can include 16109- PT Re-evaluation, 97110-Therapeutic exercises, 97530- Therapeutic activity, 97112- Neuromuscular re-education, 97535- Self Care, 97140- Manual therapy, [phone]- Gait training, [phone]- Orthotic Fit/training, [phone]- Canalith repositioning, [id number]- Aquatic Therapy, [phone]- Electrical stimulation (unattended), [phone]- Electrical stimulation (manual), [id number] Physical performance testing, 97016- Vasopneumatic device, [id number]- Ultrasound, [id number]- Traction (mechanical), [id number]- Ionotophoresis 4mg /ml Dexamethasone, Patient/Family education, Balance training, Stair training, Taping, Dry Needling, Joint mobilization, Joint manipulation, Spinal manipulation, Spinal mobilization, Scar mobilization, Vestibular training, Visual/preceptual remediation/compensation, DME instructions, Cryotherapy, and Moist heat.  All performed as medically necessary.  All included unless contraindicated  PLAN FOR NEXT SESSION: Dynamic balance, compliant surface balance.  Extension gains.  Consider transfer  into independent rehabilitation over the next few visits.  Cherlyn Cushing PT, MPT 10/03/23 4:33 PM

## 2023-10-06 ENCOUNTER — Encounter: Payer: Self-pay | Admitting: Physical Therapy

## 2023-10-06 ENCOUNTER — Ambulatory Visit: Payer: Commercial Managed Care - PPO | Admitting: Physical Therapy

## 2023-10-06 DIAGNOSIS — M6281 Muscle weakness (generalized): Secondary | ICD-10-CM

## 2023-10-06 DIAGNOSIS — R600 Localized edema: Secondary | ICD-10-CM | POA: Diagnosis not present

## 2023-10-06 DIAGNOSIS — R262 Difficulty in walking, not elsewhere classified: Secondary | ICD-10-CM

## 2023-10-06 DIAGNOSIS — M25561 Pain in right knee: Secondary | ICD-10-CM

## 2023-10-06 NOTE — Therapy (Signed)
 OUTPATIENT PHYSICAL THERAPY TREATMENT NOTE   Patient Name: Rodney Klee Curd Jr. MRN: [id number] DOB:07-08-1951, 73 y.o., male Today's Date: 10/06/2023  END OF SESSION:  PT End of Session - 10/06/23 1258     Visit Number 11    Number of Visits 27    Date for PT Re-Evaluation 12/02/23    PT Start Time 1258    PT Stop Time 1346    PT Time Calculation (min) 48 min    Activity Tolerance Patient tolerated treatment well    Behavior During Therapy WFL for tasks assessed/performed             Past Medical History:  Diagnosis Date   Allergy    SEASONAL   Anxiety    Arthritis    Asthma    as a child   BPH (benign prostatic hyperplasia)    Cancer (HCC)    prostatectomy 2018 Gleason 7   Cataract    BILATERAL   Coronary artery disease    Diabetes mellitus    average fasting 140s type 2   DJD (degenerative joint disease)    Heart murmur    dr. Gala Romney   Hepatitis    hep B   Hyperlipidemia    Hypertension    Neuromuscular disorder (HCC)    sciatica LLE   Obesity    Pneumonia    Sleep apnea    C-PAP   Past Surgical History:  Procedure Laterality Date   Biceps tendon reinsertion     colonoscopy with polypectomy     adenomatous; Dr Leone Payor   JOINT REPLACEMENT Left    knee   left knee arthroscopy     LYMPHADENECTOMY Bilateral 01/23/2017   Procedure: BILATERAL LYMPHADENECTOMY;  Surgeon: Heloise Purpura, MD;  Location: [facility];  Service: Urology;  Laterality: Bilateral;   PROSTATECTOMY     01/23/17 Dr. Laverle Patter   ROBOT ASSISTED LAPAROSCOPIC RADICAL PROSTATECTOMY N/A 01/23/2017   Procedure: XI ROBOTIC ASSISTED LAPAROSCOPIC RADICAL PROSTATECTOMY LEVEL 3;  Surgeon: Heloise Purpura, MD;  Location: [facility];  Service: Urology;  Laterality: N/A;   TONSILLECTOMY AND ADENOIDECTOMY     TOTAL KNEE ARTHROPLASTY Left 01/11/2013   Procedure: LEFT TOTAL KNEE ARTHROPLASTY;  Surgeon: Loanne Drilling, MD;  Location: [facility];  Service: Orthopedics;  Laterality: Left;   TOTAL KNEE  ARTHROPLASTY Right 08/22/2023   Procedure: RIGHT TOTAL KNEE ARTHROPLASTY;  Surgeon: Kathryne Hitch, MD;  Location: [facility];  Service: Orthopedics;  Laterality: Right;   TOTAL SHOULDER ARTHROPLASTY Left 08/12/2014   Procedure: LEFT TOTAL SHOULDER ARTHROPLASTY;  Surgeon: Verlee Rossetti, MD;  Location: [facility];  Service: Orthopedics;  Laterality: Left;   UMBILICAL HERNIA REPAIR     Patient Active Problem List   Diagnosis Date Noted   Status post total right knee replacement 08/22/2023   Situational anxiety 08/27/2022   Post-COVID syndrome 08/13/2021   Stress at home 08/13/2021   COVID-19 07/31/2021   Diabetic neuropathy (HCC) 06/27/2021   Biochemically recurrent malignant neoplasm of prostate (HCC) 04/06/2019   Dyslipidemia 08/25/2018   Well adult exam 08/25/2018   Actinic keratoses 08/25/2018   Prostate cancer (HCC) 01/23/2017   Diabetes mellitus (HCC) 10/04/2016   Aortic stenosis 08/19/2016   Coronary artery disease 08/04/2015   Obesity 08/04/2015   Osteoarthritis of multiple joints 08/04/2015   S/P TKR (total knee replacement) 08/04/2015   S/P shoulder replacement 08/12/2014   Arthritis of shoulder region, left, degenerative 03/22/2014   History of colonic polyps 05/30/2010   Obstructive sleep apnea  05/10/2009   Essential hypertension 05/10/2009   History of hepatitis B virus infection 05/10/2009    PCP: Plotnikov, Georgina Quint, MD   REFERRING PROVIDER: Kathryne Hitch*   REFERRING DIAG:  Diagnosis  [phone] (ICD-10-CM) - Status post total right knee replacement    THERAPY DIAG:  Acute pain of right knee  Difficulty in walking, not elsewhere classified  Muscle weakness (generalized)  Localized edema  Rationale for Evaluation and Treatment: Rehabilitation  ONSET DATE: 08/22/23: Rt TKA  SUBJECTIVE:   SUBJECTIVE STATEMENT: He denies pain today just with some stiffness and swelling in his knee still  PERTINENT HISTORY: Rt TKA on 08/22/23, h/o left TKA  2014, biceps tenon reinsertion  See further PMH above  PAIN:  NPRS scale: 1-4/10 this week Pain location: Rt knee Pain description: achy/tight Aggravating factors: sleeping, prolonged postures, too much WB  Relieving factors: pain meds, ice  PRECAUTIONS: None  WEIGHT BEARING RESTRICTIONS: No  FALLS:  Has patient fallen in last 6 months? 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 No periprosthetic lucency or fracture. There has been patellar resurfacing. Recent postsurgical change includes air and edema in the soft tissues and joint space. Anterior skin staples.   IMPRESSION: Right knee arthroplasty without immediate postoperative complication.  PATIENT SURVEYS:   Patient-specific activity scoring scheme   "0" represents "unable to perform." "10" represents "able to perform at prior level. 0 1 2 3 4 5 6 7 8 9  10 (Date and Score) Activity Initial  Activity 09/09/23 09/29/2023  10/03/2023  walk 4  7 8.5  2. stairs 4  7 10   3. drive 3 10 10   4.sleeping 2 6 6.5        TOTAL SCORE   13/4 3.25 30/4  7.5 35/40   Total score = sum of the activity scores/number of activities Minimum detectable change (90%CI) for average score = 2 points Minimum detectable change (90%CI) for single activity score = 3 points   COGNITION: Overall cognitive status: [facility]     LOWER EXTREMITY ROM:   ROM A: active, P: passive Right 09/09/23 Left 09/09/23 Right 09/16/2023 Right 09/17/2023 Right 09/24/2023 Right 10/03/2023 Right 10/06/23   Hip flexion 108 110       Knee flexion A: 95 P:  100 A: 108 AROM supine heel slide 103 With strap 110 With strap 114 116 120  Knee extension A: -17 A: -2   -8 -6 -6 -5   (Blank rows = not tested)  LOWER EXTREMITY MMT:  MMT Right 09/09/23 Left 09/09/23 Right 09/29/2023 Left 09/29/2023 Right 10/06/23  Hip flexion 4 5     Hip extension       Hip abduction       Hip adduction       Knee flexion 4 5     Knee extension 4 5 5/5 58, 54 lbs 5/5 86, 79 lbs 5/5 64#  Ankle dorsiflexion       Ankle plantarflexion        (Blank rows = not tested)    FUNCTIONAL TESTS:  10/06/23: 5 times sit to stand  10.28 seconds, no UE support  09/29/2023: 5x sit to stand to sit :  18.63 seconds without UE assist.   09/09/23: 5 time sit to stand: 15.25 seconds UE support  GAIT: 09/29/2023: Independent ambulation able in clinic on level surfaces.  At times, reduced step length noted with Rt leg.     Eval: Distance walked: clinic distances Assistive device utilized: Single point cane Level of assistance: Modified independence Comments: antalgic gait, decreased knee flexion and hip flexion, decreased knee extension in stance phase                                                                                                                                                                        TODAY'S TREATMENT                                                                          DATE:  10/03/2023 Recumbent bike Seat 10 60-80 RPM for 10 minutes at level 4 Updated ROM and strength measurments  Functional Activities (sit to stand and stairs, balance for gait): Double Leg Press 150# 2X10 x full extension and stretch into flexion slow eccentrics Single leg Press Right only 68# 15 x slow eccentrics full extension and stretch into flexion Dynamic tandem walk on foam 6 laps Dynamic walk sideways on foam 6 laps Walking with head nods 6 laps Walking with head turns 6 laps  Manual: Rt knee PROM with  overpressure into extension,and manual hamstring stretching  Vaso Medium 34* Right knee 10 minutes   10/01/2023 Therex: Recumbent bike Lvl 2 seat 10, 10 mins  Seated Rt leg LAQ 5# 2X10 Heel prop to tolerance for extension stretch of knee 5 mins   Functional Activities (sit to stand and stairs): Sit to stands no UE support 2X10 Forward Step-up and over 6 inch step with slow eccentrics 10 x Lateral step up and over 6 inch step X 10 bilat Tandem walk 3 round trips in bars without UE support Walking with head turns 3 round trips in bars without UE support Walking with head nods 3 round trips in bars without UE support  Vasopneumatic 10 mins 34 deg medium compression in supine elevated LE.    09/29/2023 Therex: Recumbent bike Lvl 2 seat 10, 10 mins  Seated Rt leg LAQ AROM with end range pauses each direction x 15 Heel prop to tolerance for extension stretch of knee 5 mins  TherActivity Lateral step down 6 inch step 2 x 10  Step on over and down 4 inch step with single hand rail assist x 15 Sit to stand to sit 18 inch chair x 5  Neuro Re-ed Tandem stance on foam 1 min x 2 bilateral with occasional HHA Feet together stance ankle strategy [employer] fwd/back on foam 2 x 20   Vasopneumatic 10 mins 34 deg medium compression in supine elevated LE.   PATIENT EDUCATION:  Education details: HEP, POC Person educated: Patient Education method: Programmer, multimedia, Demonstration, Verbal cues, and Handouts Education comprehension: verbalized understanding, returned demonstration, and verbal cues required  HOME EXERCISE PROGRAM: Access Code: [email or web address] URL: [email or web address] Date: 10/03/2023 Prepared by: Pauletta Browns  Exercises - Seated Long Arc Quad  - 3 x daily - 7 x weekly - 2 sets - 10 reps - 3 seconds hold - Sit to Stand with Counter Support  - 3 x daily - 7 x weekly - 2 sets - 10 reps - Seated Quad Set  - 3 x daily - 7 x weekly - 10 reps - 5-10 seconds with  overpressure hold - Supine Heel Slide with Strap  - 3 x daily - 7 x weekly - 10 reps - 5 seconds hold - Seated Passive Knee Extension  - 2 x daily - 7 x weekly - 1 sets - 1 reps - 3-10 minutes hold - Small Range Straight Leg Raise  - 2 x daily - 7 x weekly - 3-5 sets - 5 reps - 3 seconds hold  ASSESSMENT:  CLINICAL IMPRESSION: He has made great overall progress with PT up to this point. Has met most of his PT goals, only really missing some knee extension ROM but knows what to work on at home to improve this so PT will plan to finish up next visit and transition him to independent program.   OBJECTIVE IMPAIRMENTS: Abnormal gait, decreased balance, decreased mobility, difficulty walking, decreased ROM, decreased strength, increased edema, impaired flexibility, and pain.   ACTIVITY LIMITATIONS: lifting, bending, sitting, standing, squatting, sleeping, stairs, and transfers  PARTICIPATION LIMITATIONS: driving, community activity, occupation, and yard work  PERSONAL FACTORS: 3+ comorbidities: see PMH above  are also affecting patient's functional outcome.   REHAB POTENTIAL: Good  CLINICAL DECISION MAKING: Stable/uncomplicated  EVALUATION COMPLEXITY: Low   GOALS: Goals reviewed with patient? Yes  SHORT TERM GOALS: (target date for Short term goals are 3 weeks 09/30/2023)   1.  Patient will demonstrate independent use of home exercise program to maintain progress from in clinic treatments.  Goal status: Met 09/17/2023  LONG TERM GOALS: (target dates for all long term goals are 12 weeks  12/02/2023 )   1. Patient will demonstrate/report pain at worst less than or equal to 2/10 to facilitate minimal limitation in daily activity secondary to pain symptoms.  Goal status: On going 10/06/2023   2. Patient will demonstrate independent use of home exercise program to facilitate ability to maintain/progress functional gains from skilled physical therapy services.  Goal status: Met 10/03/2023    3. Patient will demonstrate Patient specific functional scale avg > or = 6.25 to indicate reduced disability due to condition.   Goal status: Met 09/29/2023   4.  Patient will demonstrate Rt  LE MMT 5/5 throughout to faciltiate usual transfers, stairs, squatting at [facility] for daily life.   Goal status: MET 10/06/23   5.  Patient will demonstrate active ROM arc of 4 - 115 degrees in his Rt knee to improve  functional mobility and gait.  Goal status: Partially met 10/03/2023   6.  Pt will be able to navigate up and down 1 flight of stairs with reciprocal gait pattern with single hand rail.  Goal status: Met 10/03/2023    PLAN:  PT FREQUENCY: 2-3x/week  PT DURATION: 12 weeks  PLANNED INTERVENTIONS: Can include 09811- PT Re-evaluation, 97110-Therapeutic exercises, 97530- Therapeutic activity, 97112- Neuromuscular re-education, 97535- Self Care, 97140- Manual therapy, [phone]- Gait training, [phone]- Orthotic Fit/training, [phone]- Canalith repositioning, [id number]- Aquatic Therapy, [phone]- Electrical stimulation (unattended), [phone]- Electrical stimulation (manual), [id number] Physical performance testing, 97016- Vasopneumatic device, [id number]- Ultrasound, [id number]- Traction (mechanical), [id number]- Ionotophoresis 4mg /ml Dexamethasone, Patient/Family education, Balance training, Stair training, Taping, Dry Needling, Joint mobilization, Joint manipulation, Spinal manipulation, Spinal mobilization, Scar mobilization, Vestibular training, Visual/preceptual remediation/compensation, DME instructions, Cryotherapy, and Moist heat.  All performed as medically necessary.  All included unless contraindicated  PLAN FOR NEXT SESSION: tansferr into independent rehabilitation next visit. What did MD say?   Ivery Quale, PT, DPT 10/06/23 12:59 PM

## 2023-10-08 ENCOUNTER — Ambulatory Visit (INDEPENDENT_AMBULATORY_CARE_PROVIDER_SITE_OTHER): Payer: Commercial Managed Care - PPO | Admitting: Orthopaedic Surgery

## 2023-10-08 ENCOUNTER — Encounter: Payer: Self-pay | Admitting: Rehabilitative and Restorative Service Providers"

## 2023-10-08 ENCOUNTER — Ambulatory Visit: Payer: Commercial Managed Care - PPO | Admitting: Rehabilitative and Restorative Service Providers"

## 2023-10-08 ENCOUNTER — Encounter: Payer: Self-pay | Admitting: Orthopaedic Surgery

## 2023-10-08 ENCOUNTER — Other Ambulatory Visit: Payer: Self-pay

## 2023-10-08 ENCOUNTER — Other Ambulatory Visit (HOSPITAL_COMMUNITY): Payer: Self-pay

## 2023-10-08 DIAGNOSIS — Z96651 Presence of right artificial knee joint: Secondary | ICD-10-CM

## 2023-10-08 DIAGNOSIS — M6281 Muscle weakness (generalized): Secondary | ICD-10-CM | POA: Diagnosis not present

## 2023-10-08 DIAGNOSIS — R262 Difficulty in walking, not elsewhere classified: Secondary | ICD-10-CM | POA: Diagnosis not present

## 2023-10-08 DIAGNOSIS — M25561 Pain in right knee: Secondary | ICD-10-CM

## 2023-10-08 DIAGNOSIS — R600 Localized edema: Secondary | ICD-10-CM | POA: Diagnosis not present

## 2023-10-08 MED ORDER — ZOSTER VAC RECOMB ADJUVANTED 50 MCG/0.5ML IM SUSR
0.5000 mL | Freq: Once | INTRAMUSCULAR | 0 refills | Status: AC
  Filled 2023-10-08: qty 0.5, 1d supply, fill #0

## 2023-10-08 NOTE — Therapy (Signed)
 OUTPATIENT PHYSICAL THERAPY TREATMENT NOTE/ DISCHARGE   Patient Name: Rodney Rosebrook Hayter Jr. MRN: [id number] DOB:[birth year], 73 y.o., male Today's Date: 10/08/2023  PHYSICAL THERAPY DISCHARGE SUMMARY  Visits from Start of Care: 12  Current functional level related to goals / functional outcomes: See note   Remaining deficits: See note   Education / Equipment: HEP  Patient goals were met. Patient is being discharged due to being pleased with the current functional level.   END OF SESSION:  PT End of Session - 10/08/23 1025     Visit Number 12    Number of Visits 27    Date for PT Re-Evaluation 12/02/23    PT Start Time 1015    PT Stop Time 1055    PT Time Calculation (min) 40 min    Activity Tolerance Patient tolerated treatment well    Behavior During Therapy WFL for tasks assessed/performed              Past Medical History:  Diagnosis Date   Allergy    SEASONAL   Anxiety    Arthritis    Asthma    as a child   BPH (benign prostatic hyperplasia)    Cancer (HCC)    prostatectomy 2018 Gleason 7   Cataract    BILATERAL   Coronary artery disease    Diabetes mellitus    average fasting 140s type 2   DJD (degenerative joint disease)    Heart murmur    dr. Gala Romney   Hepatitis    hep B   Hyperlipidemia    Hypertension    Neuromuscular disorder (HCC)    sciatica LLE   Obesity    Pneumonia    Sleep apnea    C-PAP   Past Surgical History:  Procedure Laterality Date   Biceps tendon reinsertion     colonoscopy with polypectomy     adenomatous; Dr Leone Payor   JOINT REPLACEMENT Left    knee   left knee arthroscopy     LYMPHADENECTOMY Bilateral 01/23/2017   Procedure: BILATERAL LYMPHADENECTOMY;  Surgeon: Heloise Purpura, MD;  Location: [facility];  Service: Urology;  Laterality: Bilateral;   PROSTATECTOMY     01/23/17 Dr. Laverle Patter   ROBOT ASSISTED LAPAROSCOPIC RADICAL PROSTATECTOMY N/A 01/23/2017   Procedure: XI ROBOTIC ASSISTED LAPAROSCOPIC RADICAL  PROSTATECTOMY LEVEL 3;  Surgeon: Heloise Purpura, MD;  Location: [facility];  Service: Urology;  Laterality: N/A;   TONSILLECTOMY AND ADENOIDECTOMY     TOTAL KNEE ARTHROPLASTY Left 01/11/2013   Procedure: LEFT TOTAL KNEE ARTHROPLASTY;  Surgeon: Loanne Drilling, MD;  Location: [facility];  Service: Orthopedics;  Laterality: Left;   TOTAL KNEE ARTHROPLASTY Right 08/22/2023   Procedure: RIGHT TOTAL KNEE ARTHROPLASTY;  Surgeon: Kathryne Hitch, MD;  Location: [facility];  Service: Orthopedics;  Laterality: Right;   TOTAL SHOULDER ARTHROPLASTY Left 08/12/2014   Procedure: LEFT TOTAL SHOULDER ARTHROPLASTY;  Surgeon: Verlee Rossetti, MD;  Location: [facility];  Service: Orthopedics;  Laterality: Left;   UMBILICAL HERNIA REPAIR     Patient Active Problem List   Diagnosis Date Noted   Status post total right knee replacement 08/22/2023   Situational anxiety 08/27/2022   Post-COVID syndrome 08/13/2021   Stress at home 08/13/2021   COVID-19 07/31/2021   Diabetic neuropathy (HCC) 06/27/2021   Biochemically recurrent malignant neoplasm of prostate (HCC) 04/06/2019   Dyslipidemia 08/25/2018   Well adult exam 08/25/2018   Actinic keratoses 08/25/2018   Prostate cancer (HCC) 01/23/2017   Diabetes mellitus (HCC) 10/04/2016  Aortic stenosis 08/19/2016   Coronary artery disease 08/04/2015   Obesity 08/04/2015   Osteoarthritis of multiple joints 08/04/2015   S/P TKR (total knee replacement) 08/04/2015   S/P shoulder replacement 08/12/2014   Arthritis of shoulder region, left, degenerative 03/22/2014   History of colonic polyps 05/30/2010   Obstructive sleep apnea 05/10/2009   Essential hypertension 05/10/2009   History of hepatitis B virus infection 05/10/2009    PCP: Plotnikov, Georgina Quint, MD   REFERRING PROVIDER: Kathryne Hitch*   REFERRING DIAG:  Diagnosis  [phone] (ICD-10-CM) - Status post total right knee replacement    THERAPY DIAG:  Acute pain of right knee  Difficulty in walking,  not elsewhere classified  Muscle weakness (generalized)  Localized edema  Rationale for Evaluation and Treatment: Rehabilitation  ONSET DATE: 08/22/23: Rt TKA  SUBJECTIVE:   SUBJECTIVE STATEMENT: Pt indicated continued gym work.  Planning on trainer starting back up next week.  Saw MD office today with good report.  Pt indicated overall improvement to normal around 68% but felt confident in plan to continue to improve.   PERTINENT HISTORY: Rt TKA on 08/22/23, h/o left TKA 2014, biceps tenon reinsertion  See further PMH above  PAIN:  NPRS scale: no specific pain reported upon arrival.  Pain location: Rt knee Pain description: achy/tight Aggravating factors: sleeping, prolonged postures, too much WB  Relieving factors: pain meds, ice  PRECAUTIONS: None  WEIGHT BEARING RESTRICTIONS: No  FALLS:  Has patient fallen in last 6 months? 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 No periprosthetic lucency or fracture. There has been patellar resurfacing. Recent postsurgical change includes air and edema in the soft tissues and joint space. Anterior skin staples.   IMPRESSION: Right knee arthroplasty without immediate postoperative complication.  PATIENT SURVEYS:   Patient-specific activity scoring scheme   "0" represents "unable to perform." "10" represents "able to perform at prior level. 0 1 2 3 4 5 6 7 8 9  10 (Date and Score) Activity Initial  Activity 09/09/23 09/29/2023  10/03/2023  walk 4  7 8.5  2. stairs 4  7  10   3. drive 3 10 10   4.sleeping 2 6 6.5        TOTAL SCORE   13/4 3.25 30/4  7.5 8.75   Total score = sum of the activity scores/number of activities Minimum detectable change (90%CI) for average score = 2 points Minimum detectable change (90%CI) for single activity score = 3 points   COGNITION: Overall cognitive status: [facility]     LOWER EXTREMITY ROM:   ROM A: active, P: passive Right 09/09/23 Left 09/09/23 Right 09/16/2023 Right 09/17/2023 Right 09/24/2023 Right 10/03/2023 Right 10/06/23  Hip flexion 108 110       Knee flexion A: 95 P:  100 A: 108 AROM supine heel slide 103 With strap 110 With strap 114 116 120  Knee extension A: -17 A: -2   -8 -6 -6 -5   (Blank rows = not tested)  LOWER EXTREMITY MMT:  MMT Right 09/09/23 Left 09/09/23 Right 09/29/2023 Left 09/29/2023  Right 10/06/23  Hip flexion 4 5     Hip extension       Hip abduction       Hip adduction       Knee flexion 4 5     Knee extension 4 5 5/5 58, 54 lbs 5/5 86, 79 lbs 5/5 64#  Ankle dorsiflexion       Ankle plantarflexion        (Blank rows = not tested)    FUNCTIONAL TESTS:  10/06/23: 5 times sit to stand  10.28 seconds, no UE support  09/29/2023: 5x sit to stand to sit :  18.63 seconds without UE assist.   09/09/23: 5 time sit to stand: 15.25 seconds UE support  GAIT: 09/29/2023: Independent ambulation able in clinic on level surfaces.  At times, reduced step length noted with Rt leg.   Eval: Distance walked: clinic distances Assistive device utilized: Single point cane Level of assistance: Modified independence Comments: antalgic gait, decreased knee flexion and hip flexion, decreased knee extension in stance phase                                                                                                                                                                        TODAY'S TREATMENT                                                                          DATE:  10/08/2023 Therex: Recumbent bike lvl 4 seat 10, 10 mins  Incilne gastroc stretch 30 sec x 5  Supine heel prop 2 lb overpressure 5 mins with cues for use at home.   Verbal review of key HEP for mobility management.   TherActivity: Flight of stairs up/down with reciprocal gait with one hand rail assist as necessary.  SBA.  Cues for sequencing with reciprocal gait pattern.   Neuro Re-ed: SLS with contralateral leg cone tapping in semicircle anteriorly x 8 bilateral with occasional HHA on bar.  Tandem 1 min  x 2 bilateral on foam in // bars with occasional to moderate assist.    TODAY'S TREATMENT  DATE: 10/03/2023 Recumbent bike Seat 10 60-80 RPM for 10 minutes at level 4 Updated ROM and strength measurments  Functional Activities (sit to stand and stairs, balance for gait): Double Leg Press 150# 2X10 x full extension and stretch into flexion slow eccentrics Single leg Press Right only 68# 15 x slow eccentrics full extension and stretch into flexion Dynamic tandem walk on foam 6 laps Dynamic walk sideways on foam 6 laps Walking with head nods 6 laps Walking with head turns 6 laps  Manual: Rt knee PROM with overpressure into extension,and manual hamstring stretching  Vaso Medium 34* Right knee 10 minutes   TODAY'S TREATMENT                                                                          DATE: 10/01/2023 Therex: Recumbent bike Lvl 2 seat 10, 10 mins  Seated Rt leg LAQ 5# 2X10 Heel prop to tolerance for extension stretch of knee 5 mins   Functional Activities (sit to stand and stairs): Sit to stands no UE support 2X10 Forward Step-up and over 6 inch step with slow eccentrics 10 x Lateral step up and over 6 inch step X 10 bilat Tandem walk 3 round trips in bars without UE support Walking with head turns 3 round trips in bars without UE support Walking with head nods 3 round trips in bars without UE  support  Vasopneumatic 10 mins 34 deg medium compression in supine elevated LE.    PATIENT EDUCATION:  Education details: HEP, POC Person educated: Patient Education method: Programmer, multimedia, Demonstration, Verbal cues, and Handouts Education comprehension: verbalized understanding, returned demonstration, and verbal cues required  HOME EXERCISE PROGRAM: Access Code: [email or web address] URL: [email or web address] Date: 10/03/2023 Prepared by: Pauletta Browns  Exercises - Seated Long Arc Quad  - 3 x daily - 7 x weekly - 2 sets - 10 reps - 3 seconds hold - Sit to Stand with Counter Support  - 3 x daily - 7 x weekly - 2 sets - 10 reps - Seated Quad Set  - 3 x daily - 7 x weekly - 10 reps - 5-10 seconds with overpressure hold - Supine Heel Slide with Strap  - 3 x daily - 7 x weekly - 10 reps - 5 seconds hold - Seated Passive Knee Extension  - 2 x daily - 7 x weekly - 1 sets - 1 reps - 3-10 minutes hold - Small Range Straight Leg Raise  - 2 x daily - 7 x weekly - 3-5 sets - 5 reps - 3 seconds hold  ASSESSMENT:  CLINICAL IMPRESSION: The patient has attended 12 visits over the course of treatment cycle.  Patient has reported overall improvement at 68 %   See objective data above for updated information regarding current presentation.  Pt has demonstrated good progress to reach or mostly reach established goals. At this time, Pt was in agreement with transitioning to HEP and gym based activity due to improvements.    OBJECTIVE IMPAIRMENTS: Abnormal gait, decreased balance, decreased mobility, difficulty walking, decreased ROM, decreased strength, increased edema, impaired flexibility, and pain.   ACTIVITY LIMITATIONS: lifting, bending, sitting, standing, squatting, sleeping, stairs, and transfers  PARTICIPATION LIMITATIONS: driving, community activity, occupation, and yard work  PERSONAL FACTORS: 3+ comorbidities: see PMH above  are also affecting patient's functional outcome.   REHAB  POTENTIAL: Good  CLINICAL DECISION MAKING: Stable/uncomplicated  EVALUATION COMPLEXITY: Low   GOALS: Goals reviewed with patient? Yes  SHORT TERM GOALS: (target date for Short term goals are 3 weeks 09/30/2023)   1.  Patient will demonstrate independent use of home exercise program to maintain progress from in clinic treatments.  Goal status: Met 09/17/2023  LONG TERM GOALS: (target dates for all long term goals are 12 weeks  12/02/2023 )   1. Patient will demonstrate/report pain at worst less than or equal to 2/10 to facilitate minimal limitation in daily activity secondary to pain symptoms.  Goal status: On going 10/06/2023   2. Patient will demonstrate independent use of home exercise program to facilitate ability to maintain/progress functional gains from skilled physical therapy services.  Goal status: Met 10/03/2023   3. Patient will demonstrate Patient specific functional scale avg > or = 6.25 to indicate reduced disability due to condition.   Goal status: Met 09/29/2023   4.  Patient will demonstrate Rt  LE MMT 5/5 throughout to faciltiate usual transfers, stairs, squatting at [facility] for daily life.   Goal status: MET 10/06/23   5.  Patient will demonstrate active ROM arc of 4 - 115 degrees in his Rt knee to improve functional mobility and gait.  Goal status: Mostly met 10/03/2023   6.  Pt will be able to navigate up and down 1 flight of stairs with reciprocal gait pattern with single hand rail.  Goal status: Met 10/03/2023    PLAN:  PT FREQUENCY: 2-3x/week  PT DURATION: 12 weeks  PLANNED INTERVENTIONS: Can include 16109- PT Re-evaluation, 97110-Therapeutic exercises, 97530- Therapeutic activity, 97112- Neuromuscular re-education, 97535- Self Care, 97140- Manual therapy, [phone]- Gait training, [phone]- Orthotic Fit/training, [phone]- Canalith repositioning, [id number]- Aquatic Therapy, [phone]- Electrical stimulation (unattended), [phone]- Electrical stimulation (manual), [id number] Physical  performance testing, 97016- Vasopneumatic device, [id number]- Ultrasound, [id number]- Traction (mechanical), [id number]- Ionotophoresis 4mg /ml Dexamethasone, Patient/Family education, Balance training, Stair training, Taping, Dry Needling, Joint mobilization, Joint manipulation, Spinal manipulation, Spinal mobilization, Scar mobilization, Vestibular training, Visual/preceptual remediation/compensation, DME instructions, Cryotherapy, and Moist heat.  All performed as medically necessary.  All included unless contraindicated  PLAN FOR NEXT SESSION: Discharge to HEP/gym    Chyrel Masson, PT, DPT, OCS, ATC 10/08/23  11:00 AM

## 2023-10-08 NOTE — Progress Notes (Signed)
 Rodney Hayes is now past 6 weeks status post a right total knee arthroplasty.  Rodney Hayes is doing well overall.  Rodney Hayes is walking without assistive device.  Rodney Hayes reports improved range of motion and strength and Rodney Hayes says the knee feels stable.  On my exam Rodney Hayes lacks full extension by 3 to 5 degrees but Rodney Hayes can flex to well past 90 degrees.  The knee feels stable on exam.  Some of his gait abnormalities are related to an old distal tib-fib fracture on his right side.  From my standpoint we will see him back in in 4 weeks with a standing AP and lateral of his right operative knee.  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

## 2023-10-16 ENCOUNTER — Encounter: Payer: Self-pay | Admitting: Orthopaedic Surgery

## 2023-10-17 ENCOUNTER — Other Ambulatory Visit (HOSPITAL_COMMUNITY): Payer: Self-pay

## 2023-10-17 ENCOUNTER — Other Ambulatory Visit: Payer: Self-pay

## 2023-10-27 ENCOUNTER — Encounter: Payer: Self-pay | Admitting: Orthopaedic Surgery

## 2023-10-27 ENCOUNTER — Other Ambulatory Visit (INDEPENDENT_AMBULATORY_CARE_PROVIDER_SITE_OTHER): Payer: Self-pay

## 2023-10-27 ENCOUNTER — Ambulatory Visit (INDEPENDENT_AMBULATORY_CARE_PROVIDER_SITE_OTHER): Admitting: Orthopaedic Surgery

## 2023-10-27 DIAGNOSIS — Z96651 Presence of right artificial knee joint: Secondary | ICD-10-CM

## 2023-10-27 NOTE — Progress Notes (Signed)
 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  This was 9 weeks ago.  He has been working out with a Systems analyst and he reports good range of motion of his right knee.  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  I think this is absolutely reasonable given his recovery.  Examination of his right knee still shows swelling to be expected.  His extension is almost full and his flexion is almost full.  The knee feels ligamentously stable.  2 views of the right knee show well-seated right total knee arthroplasty with no complicating features.  The AP view also shows his left knee which has good alignment.  That knee was replaced remotely by one of my colleagues in the home.  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  Will see him back in 6 months with repeat AP and lateral standing of his right knee.  If there are issues before then he knows to reach out and let me know.

## 2023-11-01 ENCOUNTER — Other Ambulatory Visit: Payer: Self-pay | Admitting: Internal Medicine

## 2023-11-01 ENCOUNTER — Other Ambulatory Visit (HOSPITAL_COMMUNITY): Payer: Self-pay

## 2023-11-01 MED ORDER — AMLODIPINE BESYLATE 5 MG PO TABS
5.0000 mg | ORAL_TABLET | Freq: Every day | ORAL | 3 refills | Status: AC
  Filled 2023-11-01: qty 90, 90d supply, fill #0
  Filled 2024-02-04: qty 90, 90d supply, fill #1
  Filled 2024-05-03: qty 90, 90d supply, fill #2
  Filled 2024-08-09: qty 90, 90d supply, fill #3

## 2023-11-03 ENCOUNTER — Other Ambulatory Visit: Payer: Self-pay

## 2023-11-03 ENCOUNTER — Other Ambulatory Visit (HOSPITAL_COMMUNITY): Payer: Self-pay

## 2023-11-05 ENCOUNTER — Encounter: Admitting: Orthopaedic Surgery

## 2023-11-25 ENCOUNTER — Encounter: Payer: Self-pay | Admitting: Internal Medicine

## 2023-12-08 NOTE — Progress Notes (Unsigned)
 CARDIOLOGY CONSULT NOTE       Patient ID: Rodney Milch Rodney Jr. MRN: [id number] DOB/AGE: August 15, 1950 73 y.o.  Referring Physician: Plotnicov Primary Physician: Rodney Kettering, MD Primary Cardiologist: Rodney Hayes Reason for Consultation: Preoperative    HPI:  73 y.o. referred by Rodney Hayes for preoperative assessment. 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 He has significant arthritis and use to lift heavy weights. CRF;s include HTN and DM.  His LDL is elevated 126 not on statin.   He had a coronary CTA done 04/01/14 with a calcium  score of 1847 divided between RCA and LAD , 97 th percentile  Cath not pursued by Rodney Hayes due to lack of symptoms At that point he was on statin/ASA Echo 2017 with normal EF and mild AS mean gradient 10 peak 21 mmhg   He ambulates with limp Still going to [employer] and can due > 5 METS Does bicycle with sprint intervals 40 minutes at a time.  TTE done 06/03/23 EF 60-65% Mild elevated PA pressure likely from OSA severe bi atrial enlargement mild AV sclerosis no AS  PET/CT done 07/17/23 with no ischemia, normal myocardial blood flow reserve normal stress EF 57% no TID   ECG 11/26  showed afib CHADVASC 4 Patient deferred any anticoagulation   He indicates knowing about PAF when he is at doctors office mostly He refuses to take anticoagulation Rates are fine Given age and severe bi atrial enlargement on TTE unlikely to convert Defers AAT as well   Married Wife is a Engineer, civil (consulting) as well and adm for 2[employer] One daughter in [place] getting NP degree at [employer]   Ultimately had right knee replaced with Rodney Hayes Has had left knee done previously. Did well with no cardiac issues. Back to work earlier this month  *** ROS All other systems reviewed and negative except as noted above  Past Medical History:  Diagnosis Date  . Allergy    SEASONAL  . Anxiety   . Arthritis    . Asthma    as a child  . BPH (benign prostatic hyperplasia)   . Cancer [facility])    prostatectomy 2018 Gleason 7  . Cataract    BILATERAL  . Coronary artery disease   . Diabetes mellitus    average fasting 140s type 2  . DJD (degenerative joint disease)   . Heart murmur    Rodney. [email or web address]  . Hepatitis    hep B  . Hyperlipidemia   . Hypertension   . Neuromuscular disorder (HCC)    sciatica LLE  . Obesity   . Pneumonia   . Sleep apnea    C-PAP    Family History  Problem Relation Age of Onset  . Colon cancer Mother   . Lung cancer Mother        smoker/lung ca and colon?  Rodney Hayes Alcohol abuse Mother   . Diabetes Mother   . Diabetes Father   . Coronary artery disease Father        CBAG in late 49s  . Cancer Father        prostate  . Heart attack Father   . Stroke Neg Hx   . Colon polyps Neg Hx   . Crohn's disease Neg Hx   . Esophageal cancer Neg Hx   . Rectal cancer Neg Hx   . Stomach cancer Neg Hx   . Ulcerative colitis Neg Hx  Social History   Socioeconomic History  . Marital status: Married    Spouse name: Not on file  . Number of children: Not on file  . Years of education: Not on file  . Highest education level: Not on file  Occupational History  . Not on file  Tobacco Use  . Smoking status: Former    Current packs/day: 0.00    Average packs/day: 2.0 packs/day for 30.1 years (60.2 ttl pk-yrs)    Types: Cigarettes    Start date: 07/15/1962    Quit date: 08/15/1992    Years since quitting: 31.3  . Smokeless tobacco: Never  . Tobacco comments:    smoked 14 -42 up to 2 ppd  Vaping Use  . Vaping status: Never Used  Substance and Sexual Activity  . Alcohol use: Yes    Alcohol/week: 4.0 standard drinks of alcohol    Types: 4 Glasses of wine per week    Comment: Red Wine  . Drug use: No  . Sexual activity: Yes  Other Topics Concern  . Not on file  Social History Narrative  . Not on file   Social Drivers of Health   Financial Resource Strain: Not on  file  Food Insecurity: No Food Insecurity (08/22/2023)   Hunger Vital Sign   . Worried About Programme researcher, broadcasting/film/video in the Last Year: Never true   . Ran Out of Food in the Last Year: Never true  Transportation Needs: No Transportation Needs (08/22/2023)   PRAPARE - Transportation   . Lack of Transportation (Medical): No   . Lack of Transportation (Non-Medical): No  Physical Activity: Not on file  Stress: Not on file  Social Connections: Not on file  Intimate Partner Violence: Not At Risk (08/22/2023)   Humiliation, Afraid, Rape, and Kick questionnaire   . Fear of Current or Ex-Partner: No   . Emotionally Abused: No   . Physically Abused: No   . Sexually Abused: No    Past Surgical History:  Procedure Laterality Date  . Biceps tendon reinsertion    . colonoscopy with polypectomy     adenomatous; Rodney Willy Harvest  . JOINT REPLACEMENT Left    knee  . left knee arthroscopy    . LYMPHADENECTOMY Bilateral 01/23/2017   Procedure: BILATERAL LYMPHADENECTOMY;  Surgeon: Florencio Hunting, MD;  Location: [facility];  Service: Urology;  Laterality: Bilateral;  . PROSTATECTOMY     01/23/17 Rodney. Rozanne Corners  . ROBOT ASSISTED LAPAROSCOPIC RADICAL PROSTATECTOMY N/A 01/23/2017   Procedure: XI ROBOTIC ASSISTED LAPAROSCOPIC RADICAL PROSTATECTOMY LEVEL 3;  Surgeon: Florencio Hunting, MD;  Location: [facility];  Service: Urology;  Laterality: N/A;  . TONSILLECTOMY AND ADENOIDECTOMY    . TOTAL KNEE ARTHROPLASTY Left 01/11/2013   Procedure: LEFT TOTAL KNEE ARTHROPLASTY;  Surgeon: Aurther Blue, MD;  Location: [facility];  Service: Orthopedics;  Laterality: Left;  . TOTAL KNEE ARTHROPLASTY Right 08/22/2023   Procedure: RIGHT TOTAL KNEE ARTHROPLASTY;  Surgeon: Arnie Lao, MD;  Location: [facility];  Service: Orthopedics;  Laterality: Right;  . TOTAL SHOULDER ARTHROPLASTY Left 08/12/2014   Procedure: LEFT TOTAL SHOULDER ARTHROPLASTY;  Surgeon: Lorriane Rote, MD;  Location: [facility] OR;  Service: Orthopedics;  Laterality: Left;  . UMBILICAL  HERNIA REPAIR        Current Outpatient Medications:  .  albuterol  (VENTOLIN  HFA) 108 (90 Base) MCG/ACT inhaler, Inhale 2 puffs into the lungs every 6 (six) hours as needed for wheezing or shortness of breath., Disp: 8.5 g, Rfl: 2 .  amLODipine  (NORVASC ) 5 MG tablet, Take 1 tablet (5 mg total) by mouth daily., Disp: 90 tablet, Rfl: 3 .  Cholecalciferol (VITAMIN D3) 50 MCG (2000 UT) capsule, Take 1 capsule (2,000 Units total) by mouth daily., Disp: 100 capsule, Rfl: 3 .  Continuous Glucose Sensor (DEXCOM G7 SENSOR) MISC, Replace every 10 days, Disp: 3 each, Rfl: 11 .  dapagliflozin  propanediol (FARXIGA ) 10 MG TABS tablet, Take 1 tablet (10 mg total) by mouth daily before breakfast., Disp: 30 tablet, Rfl: 11 .  diazepam  (VALIUM ) 5 MG tablet, Take 1-2 tablets by mouth as needed for procedure anxiety or insomnia., Disp: 60 tablet, Rfl: 1 .  gabapentin  (NEURONTIN ) 300 MG capsule, Take 1 capsule (300 mg total) by mouth 4 (four) times daily., Disp: 360 capsule, Rfl: 3 .  glucose blood (FREESTYLE LITE) test strip, Use as directed four times daily, Disp: 200 each, Rfl: 0 .  glucose monitoring kit (FREESTYLE) monitoring kit, 1 each by Does not apply route as needed for other., Disp: 1 each, Rfl: 1 .  hydrochlorothiazide  (HYDRODIURIL ) 25 MG tablet, Take 1 tablet (25 mg total) by mouth daily., Disp: 90 tablet, Rfl: 2 .  icosapent  Ethyl (VASCEPA ) 1 g capsule, Take 2 capsules (2 g total) by mouth 2 (two) times daily., Disp: 360 capsule, Rfl: 2 .  insulin  glargine-yfgn (SEMGLEE , YFGN,) 100 UNIT/ML Pen, Inject 20 Units into the skin 2 (two) times daily., Disp: 15 mL, Rfl: 2 .  Insulin  Pen Needle (TECHLITE PEN NEEDLES) 31G X 5 MM MISC, Use to administer insulin  2 (two) times daily., Disp: 100 each, Rfl: 3 .  Lancets (FREESTYLE) lancets, Use as instructed qid, Disp: 200 each, Rfl: 12 .  Lancets Misc. (ACCU-CHEK FASTCLIX LANCET) KIT, Use as directed, Disp: 1 kit, Rfl: 5 .  levalbuterol  (XOPENEX ) 0.63 MG/3ML  nebulizer solution, Take 3 mLs (0.63 mg total) by nebulization every 4 (four) hours as needed for wheezing or shortness of breath., Disp: 120 mL, Rfl: 5 .  losartan  (COZAAR ) 100 MG tablet, Take 1 tablet (100 mg total) by mouth daily, Disp: 30 tablet, Rfl: 11 .  psyllium (METAMUCIL) 58.6 % packet, Take 1 packet by mouth daily., Disp: , Rfl:  .  tadalafil  (CIALIS ) 20 MG tablet, Take 1 tablet (20 mg total) by mouth every evening if needed, Disp: 30 tablet, Rfl: 11 .  traMADol  (ULTRAM ) 50 MG tablet, Take 1 tablet (50 mg total) by mouth 3 (three) to 4 (four) times daily., Disp: 120 tablet, Rfl: 3  Current Facility-Administered Medications:  .  0.9 %  sodium chloride  infusion, 500 mL, Intravenous, Once, Kenney Peacemaker, MD  . sodium chloride       Physical Exam: Blood pressure 126/72, pulse (!) 55, height 5\' 11"  (1.803 m), weight 291 lb (132 kg), SpO2 95%.     Overweight white male  Lungs clear Cardiac no murmur  Abdomen benign Prior left TKR  Post right TKR  Plus one bilateral edema with venous insufficiency and pigmentation  Labs:   Lab Results  Component Value Date   WBC 9.4 08/23/2023   HGB 13.4 08/23/2023   HCT 41.9 08/23/2023   MCV 95.9 08/23/2023   PLT 188 08/23/2023   No results for input(s): "NA", "K", "CL", "CO2", "BUN", "CREATININE", "CALCIUM ", "PROT", "BILITOT", "ALKPHOS", "ALT", "AST", "GLUCOSE" in the last 168 hours.  Invalid input(s): "LABALBU" No results found for: "CKTOTAL", "CKMB", "CKMBINDEX", "TROPONINI"  Lab Results  Component Value Date   CHOL 207 (H) 08/22/2022   CHOL 220 (H) 03/26/2021  CHOL 197 09/24/2019   Lab Results  Component Value Date   HDL 49.20 08/22/2022   HDL 53.90 03/26/2021   HDL 48.60 09/24/2019   Lab Results  Component Value Date   LDLCALC 126 (H) 08/22/2022   LDLCALC 139 (H) 03/26/2021   LDLCALC 127 (H) 09/24/2019   Lab Results  Component Value Date   TRIG 159.0 (H) 08/22/2022   TRIG 133.0 03/26/2021   TRIG 105.0 09/24/2019    Lab Results  Component Value Date   CHOLHDL 4 08/22/2022   CHOLHDL 4 03/26/2021   CHOLHDL 4 09/24/2019   Lab Results  Component Value Date   LDLDIRECT 89.0 08/16/2016   LDLDIRECT 69.9 07/28/2012      Radiology: No results found.   EKG: AFib rate 66 inferior T wave changes    ASSESSMENT AND PLAN:   Ortho:  now post left/right TKR;s Did well to lose weight prior to surgery continue PT/OT Post op xray on right looks good  DM:  Discussed low carb diet.  Target hemoglobin A1c is 6.5 or less.  Continue current medications. A1c improved 9.3-> 6.7  HTN; continue norvasc  and ARB OSA:  continue CPAP  PAF:  refuses anticoagulation. Not interested in Watchman or AAT rates are fine observe Echo 06/03/23 normal EF severe bi atrial enlargement   Refer to lipid clinic  ***  F/U 6 months    Signed: Janelle Mediate 12/22/2023, 9:56 AM

## 2023-12-11 ENCOUNTER — Other Ambulatory Visit: Payer: Self-pay

## 2023-12-11 ENCOUNTER — Other Ambulatory Visit: Payer: Self-pay | Admitting: Internal Medicine

## 2023-12-15 ENCOUNTER — Other Ambulatory Visit (HOSPITAL_COMMUNITY): Payer: Self-pay

## 2023-12-15 MED ORDER — DIAZEPAM 5 MG PO TABS
5.0000 mg | ORAL_TABLET | ORAL | 1 refills | Status: DC | PRN
  Filled 2023-12-15: qty 60, 30d supply, fill #0
  Filled 2024-01-09 – 2024-01-12 (×2): qty 60, 30d supply, fill #1

## 2023-12-22 ENCOUNTER — Encounter: Payer: Self-pay | Admitting: Cardiovascular Disease

## 2023-12-22 ENCOUNTER — Ambulatory Visit: Payer: Commercial Managed Care - PPO | Attending: Internal Medicine | Admitting: Cardiovascular Disease

## 2023-12-22 VITALS — BP 126/72 | HR 55 | Ht 71.0 in | Wt 291.0 lb

## 2023-12-22 DIAGNOSIS — I10 Essential (primary) hypertension: Secondary | ICD-10-CM | POA: Diagnosis not present

## 2023-12-22 DIAGNOSIS — I480 Paroxysmal atrial fibrillation: Secondary | ICD-10-CM | POA: Diagnosis not present

## 2023-12-22 DIAGNOSIS — R931 Abnormal findings on diagnostic imaging of heart and coronary circulation: Secondary | ICD-10-CM

## 2023-12-22 NOTE — Patient Instructions (Signed)

## 2023-12-25 ENCOUNTER — Other Ambulatory Visit: Payer: Self-pay

## 2023-12-25 ENCOUNTER — Other Ambulatory Visit (HOSPITAL_COMMUNITY): Payer: Self-pay

## 2024-01-09 ENCOUNTER — Other Ambulatory Visit: Payer: Self-pay

## 2024-01-09 ENCOUNTER — Other Ambulatory Visit: Payer: Self-pay | Admitting: Internal Medicine

## 2024-01-09 ENCOUNTER — Other Ambulatory Visit (HOSPITAL_COMMUNITY): Payer: Self-pay

## 2024-01-09 MED ORDER — ICOSAPENT ETHYL 1 G PO CAPS
2.0000 g | ORAL_CAPSULE | Freq: Two times a day (BID) | ORAL | 2 refills | Status: AC
  Filled 2024-01-09: qty 360, 90d supply, fill #0

## 2024-01-12 ENCOUNTER — Other Ambulatory Visit: Payer: Self-pay

## 2024-01-12 ENCOUNTER — Other Ambulatory Visit (HOSPITAL_COMMUNITY): Payer: Self-pay

## 2024-01-12 MED ORDER — ZOSTER VAC RECOMB ADJUVANTED 50 MCG/0.5ML IM SUSR
0.5000 mL | Freq: Once | INTRAMUSCULAR | 0 refills | Status: AC
  Filled 2024-01-12: qty 0.5, 1d supply, fill #0

## 2024-01-13 ENCOUNTER — Other Ambulatory Visit (HOSPITAL_COMMUNITY): Payer: Self-pay

## 2024-01-21 ENCOUNTER — Telehealth: Payer: Self-pay

## 2024-01-21 NOTE — Telephone Encounter (Signed)
 Missy from dental office called and stated patient is having upcoming dental work. She wants to know if he needs to be premedicated prior to. He had surgery knee replacement earlier in the year.  Call Missy at [phone]

## 2024-01-22 DIAGNOSIS — K080 Exfoliation of teeth due to systemic causes: Secondary | ICD-10-CM | POA: Diagnosis not present

## 2024-02-04 ENCOUNTER — Other Ambulatory Visit: Payer: Self-pay | Admitting: Internal Medicine

## 2024-02-04 ENCOUNTER — Other Ambulatory Visit (HOSPITAL_COMMUNITY): Payer: Self-pay

## 2024-02-04 MED ORDER — INSULIN GLARGINE-YFGN 100 UNIT/ML ~~LOC~~ SOPN
20.0000 [IU] | PEN_INJECTOR | Freq: Two times a day (BID) | SUBCUTANEOUS | 2 refills | Status: AC
  Filled 2024-02-04: qty 15, 38d supply, fill #0
  Filled 2024-03-09: qty 15, 38d supply, fill #1
  Filled 2024-03-24: qty 15, 38d supply, fill #0
  Filled 2024-03-24: qty 15, 38d supply, fill #1
  Filled 2024-03-24: qty 15, 38d supply, fill #0

## 2024-02-05 ENCOUNTER — Other Ambulatory Visit: Payer: Self-pay

## 2024-02-05 ENCOUNTER — Other Ambulatory Visit (HOSPITAL_COMMUNITY): Payer: Self-pay

## 2024-02-13 ENCOUNTER — Other Ambulatory Visit: Payer: Self-pay | Admitting: Internal Medicine

## 2024-02-15 ENCOUNTER — Other Ambulatory Visit (HOSPITAL_COMMUNITY): Payer: Self-pay

## 2024-02-16 ENCOUNTER — Other Ambulatory Visit (HOSPITAL_COMMUNITY): Payer: Self-pay

## 2024-02-16 ENCOUNTER — Other Ambulatory Visit: Payer: Self-pay

## 2024-02-16 MED ORDER — DIAZEPAM 5 MG PO TABS
5.0000 mg | ORAL_TABLET | ORAL | 1 refills | Status: DC | PRN
  Filled 2024-02-16 – 2024-02-26 (×2): qty 60, 30d supply, fill #0
  Filled 2024-04-05: qty 60, 30d supply, fill #1
  Filled 2024-04-07: qty 60, 30d supply, fill #0

## 2024-02-18 ENCOUNTER — Other Ambulatory Visit (HOSPITAL_COMMUNITY): Payer: Self-pay

## 2024-02-25 ENCOUNTER — Other Ambulatory Visit (HOSPITAL_COMMUNITY): Payer: Self-pay

## 2024-02-25 ENCOUNTER — Telehealth: Payer: Self-pay

## 2024-02-25 NOTE — Telephone Encounter (Signed)
 Copied from CRM #[id number]. Topic: Clinical - Medication Question >> Feb 25, 2024 12:06 PM Frederich PARAS wrote: Reason for CRM: Rodney Hayes from [employer] has  a medication request called samglee ,  she says there is some clinical information that needs to be adressed   callback # [phone]  opt 5.

## 2024-02-26 ENCOUNTER — Other Ambulatory Visit: Payer: Self-pay

## 2024-02-26 ENCOUNTER — Other Ambulatory Visit (HOSPITAL_COMMUNITY): Payer: Self-pay

## 2024-03-04 DIAGNOSIS — C61 Malignant neoplasm of prostate: Secondary | ICD-10-CM | POA: Diagnosis not present

## 2024-03-08 ENCOUNTER — Telehealth: Payer: Self-pay

## 2024-03-08 NOTE — Telephone Encounter (Unsigned)
 Copied from CRM #[id number]. Topic: General - Other >> Mar 08, 2024  3:52 PM Berneda FALCON wrote: Reason for CRM: Sharlet from [facility] appeals is calling to speak to a nurse in the office about an expedited appeal for a medication. She has some additional questions she needs to speak to the nurse about. Called the CAL and was told that all the nurses are in clinic right now and to send a message.  Please call Sharlet back at [phone] call tomorrow if possible.

## 2024-03-08 NOTE — Telephone Encounter (Unsigned)
 Copied from CRM #[id number]. Topic: Medical Record Request - Other >> Mar 08, 2024 12:34 PM Chiquita SQUIBB wrote: Reason for CRM: Sharlet from [facility] is calling in regarding needing medical records for to get the insulin  glargine-yfgn (SEMGLEE , YFGN,) 100 UNIT/ML Pen [[id number]] approved. Please fax them to [phone], Case number is  [id number], and a phone number is [phone] OK to leave a detailed voicemail, it is secure. They are requesting this as a urgent request.

## 2024-03-09 ENCOUNTER — Other Ambulatory Visit (HOSPITAL_COMMUNITY): Payer: Self-pay

## 2024-03-09 NOTE — Telephone Encounter (Signed)
 Florence Males at number provided no answer at this time.

## 2024-03-09 NOTE — Telephone Encounter (Signed)
Patient called to follow up on previous message

## 2024-03-10 ENCOUNTER — Encounter: Payer: Self-pay | Admitting: Internal Medicine

## 2024-03-10 NOTE — Telephone Encounter (Signed)
 Spoke with Rodney Hayes to clarify questions based on provider notes in system.  Yes, patient has Diabetes Mellitus  He has not tried Lantus  or Tregeo. But has tried other insulins and diabetic medications. - Also let her know that patient has been on Semglee  since Jan. 2023.   Why, patient stated since 2022 intolerance to medications, but we do not have details of what was tried and the details of the intolerance.   No additional questions at this time.

## 2024-03-10 NOTE — Telephone Encounter (Signed)
 Sharlet from [facility] called back this morning, very upset that she has called for three days with no response. She needs a call back to answer the following questions:  1 - Does the patient have a dx of DM II?  2 - Has patient tried Lantus  or Tujeo, and if not, why not.  You can call Sharlet directly at [phone] or you can fax Sharlet the answer to these questions at [phone].

## 2024-03-10 NOTE — Telephone Encounter (Unsigned)
 Copied from CRM [id number]. Topic: General - Other >> Mar 09, 2024  4:31 PM Dedra B wrote: Reason for CRM: Sharlet from [employer] returning call. She needs the following questions answered: 1. Has the member been diagnosed with diabetes mellitus? 2. Has the member tried Lantus  or toujeo ? 3. If he has not tried either, clinical information or an explanation as to why he has not is needed.  Pls fax answer to questions ASAP to [phone]  Attn: Sharlet  Case no. [id number]  Pls answer the the questions and don't just send medical records.

## 2024-03-17 DIAGNOSIS — C61 Malignant neoplasm of prostate: Secondary | ICD-10-CM | POA: Diagnosis not present

## 2024-03-17 DIAGNOSIS — N5231 Erectile dysfunction following radical prostatectomy: Secondary | ICD-10-CM | POA: Diagnosis not present

## 2024-03-17 DIAGNOSIS — N393 Stress incontinence (female) (male): Secondary | ICD-10-CM | POA: Diagnosis not present

## 2024-03-24 ENCOUNTER — Other Ambulatory Visit: Payer: Self-pay

## 2024-03-24 ENCOUNTER — Other Ambulatory Visit (HOSPITAL_COMMUNITY): Payer: Self-pay

## 2024-03-25 ENCOUNTER — Other Ambulatory Visit (HOSPITAL_COMMUNITY): Payer: Self-pay

## 2024-04-06 ENCOUNTER — Other Ambulatory Visit: Payer: Self-pay

## 2024-04-07 ENCOUNTER — Other Ambulatory Visit (HOSPITAL_COMMUNITY): Payer: Self-pay

## 2024-04-08 ENCOUNTER — Encounter: Payer: Self-pay | Admitting: Internal Medicine

## 2024-04-15 ENCOUNTER — Other Ambulatory Visit (HOSPITAL_COMMUNITY): Payer: Self-pay

## 2024-04-15 ENCOUNTER — Encounter: Payer: Self-pay | Admitting: Internal Medicine

## 2024-04-15 ENCOUNTER — Other Ambulatory Visit: Payer: Self-pay

## 2024-04-15 ENCOUNTER — Ambulatory Visit (INDEPENDENT_AMBULATORY_CARE_PROVIDER_SITE_OTHER): Payer: Self-pay | Admitting: Internal Medicine

## 2024-04-15 ENCOUNTER — Telehealth (HOSPITAL_COMMUNITY): Payer: Self-pay

## 2024-04-15 VITALS — BP 148/86 | HR 52 | Temp 98.0°F | Ht 71.0 in | Wt 296.6 lb

## 2024-04-15 DIAGNOSIS — E1140 Type 2 diabetes mellitus with diabetic neuropathy, unspecified: Secondary | ICD-10-CM

## 2024-04-15 DIAGNOSIS — Z794 Long term (current) use of insulin: Secondary | ICD-10-CM

## 2024-04-15 DIAGNOSIS — Z23 Encounter for immunization: Secondary | ICD-10-CM | POA: Diagnosis not present

## 2024-04-15 DIAGNOSIS — I2510 Atherosclerotic heart disease of native coronary artery without angina pectoris: Secondary | ICD-10-CM

## 2024-04-15 DIAGNOSIS — I10 Essential (primary) hypertension: Secondary | ICD-10-CM | POA: Diagnosis not present

## 2024-04-15 DIAGNOSIS — Z96651 Presence of right artificial knee joint: Secondary | ICD-10-CM | POA: Diagnosis not present

## 2024-04-15 DIAGNOSIS — G4733 Obstructive sleep apnea (adult) (pediatric): Secondary | ICD-10-CM

## 2024-04-15 MED ORDER — TIRZEPATIDE 2.5 MG/0.5ML ~~LOC~~ SOAJ
2.5000 mg | SUBCUTANEOUS | 3 refills | Status: DC
  Filled 2024-04-15 (×2): qty 2, 28d supply, fill #0

## 2024-04-15 NOTE — Telephone Encounter (Signed)
 Copied from CRM [id number]. Topic: Clinical - Medication Prior Auth >> Apr 15, 2024 11:37 AM Rodney Hayes wrote: Reason for CRM: [employer] - notify prior auth approval on mediation  Mounjaro 2.5MG /0.5ML auto-injectors approved for a year affect todays date   Will be faxing it over as well

## 2024-04-15 NOTE — Assessment & Plan Note (Signed)
No angina.  Good exercise tolerance He is cleared for his right knee surgery under spinal anesthesia

## 2024-04-15 NOTE — Assessment & Plan Note (Signed)
Start Mounjaro

## 2024-04-15 NOTE — Assessment & Plan Note (Signed)
 Blood pressure is well-controlled.  Continue with Farxiga , HCTZ and losartan  BP Readings from Last 3 Encounters:  04/15/24 (!) 148/86  12/22/23 126/72  08/23/23 (!) 164/82

## 2024-04-15 NOTE — Telephone Encounter (Signed)
 Pharmacy Patient Advocate Encounter   Received notification from Pt Calls Messages that prior authorization for Mounjaro 2.5MG /0.5ML auto-injectors  is required/requested.   Insurance verification completed.   The patient is insured through [facility]   Per test claim: PA required; PA submitted to above mentioned insurance via Latent Key/confirmation #/EOC [id number] Status is pending

## 2024-04-15 NOTE — Telephone Encounter (Signed)
 PA request has been Received. New Encounter has been or will be created for follow up. For additional info see Pharmacy Prior Auth telephone encounter from 04/15/24.

## 2024-04-15 NOTE — Assessment & Plan Note (Signed)
 On CPAP. ?

## 2024-04-15 NOTE — Progress Notes (Signed)
 Subjective:  Patient ID: Elsie [name] Roxanna Mickey., male    DOB: 06-14-1951  Age: 73 y.o. MRN: [id number]  CC: Annual Exam   HPI Orry Sigl Hunsinger Jr. presents for DM, HTN, OA S/p R TKR  Outpatient Medications Prior to Visit  Medication Sig Dispense Refill   albuterol  (VENTOLIN  HFA) 108 (90 Base) MCG/ACT inhaler Inhale 2 puffs into the lungs every 6 (six) hours as needed for wheezing or shortness of breath. 8.5 g 2   amLODipine  (NORVASC ) 5 MG tablet Take 1 tablet (5 mg total) by mouth daily. 90 tablet 3   Cholecalciferol (VITAMIN D3) 50 MCG (2000 UT) capsule Take 1 capsule (2,000 Units total) by mouth daily. 100 capsule 3   Continuous Glucose Sensor (DEXCOM G7 SENSOR) MISC Replace every 10 days 3 each 11   dapagliflozin  propanediol (FARXIGA ) 10 MG TABS tablet Take 1 tablet (10 mg total) by mouth daily before breakfast. 30 tablet 11   diazepam  (VALIUM ) 5 MG tablet Take 1-2 tablets by mouth as needed for procedure anxiety or insomnia. 60 tablet 1   gabapentin  (NEURONTIN ) 300 MG capsule Take 1 capsule (300 mg total) by mouth 4 (four) times daily. 360 capsule 3   glucose blood (FREESTYLE LITE) test strip Use as directed four times daily 200 each 0   glucose monitoring kit (FREESTYLE) monitoring kit 1 each by Does not apply route as needed for other. 1 each 1   hydrochlorothiazide  (HYDRODIURIL ) 25 MG tablet Take 1 tablet (25 mg total) by mouth daily. 90 tablet 2   icosapent  Ethyl (VASCEPA ) 1 g capsule Take 2 capsules (2 g total) by mouth 2 (two) times daily. 360 capsule 2   insulin  glargine-yfgn (SEMGLEE , YFGN,) 100 UNIT/ML Pen Inject 20 Units into the skin 2 (two) times daily. 15 mL 2   Insulin  Pen Needle (TECHLITE PEN NEEDLES) 31G X 5 MM MISC Use to administer insulin  2 (two) times daily. 100 each 3   Lancets (FREESTYLE) lancets Use as instructed qid 200 each 12   Lancets Misc. (ACCU-CHEK FASTCLIX LANCET) KIT Use as directed 1 kit 5   levalbuterol  (XOPENEX ) 0.63 MG/3ML nebulizer  solution Take 3 mLs (0.63 mg total) by nebulization every 4 (four) hours as needed for wheezing or shortness of breath. 120 mL 5   losartan  (COZAAR ) 100 MG tablet Take 1 tablet (100 mg total) by mouth daily 30 tablet 11   psyllium (METAMUCIL) 58.6 % packet Take 1 packet by mouth daily.     tadalafil  (CIALIS ) 20 MG tablet Take 1 tablet (20 mg total) by mouth every evening if needed 30 tablet 11   traMADol  (ULTRAM ) 50 MG tablet Take 1 tablet (50 mg total) by mouth 3 (three) to 4 (four) times daily. 120 tablet 3   Facility-Administered Medications Prior to Visit  Medication Dose Route Frequency Provider Last Rate Last Admin   0.9 %  sodium chloride  infusion  500 mL Intravenous Once Avram Lupita BRAVO, MD        ROS: Review of Systems  Constitutional:  Positive for unexpected weight change. Negative for appetite change and fatigue.  HENT:  Negative for congestion, nosebleeds, sneezing, sore throat and trouble swallowing.   Eyes:  Negative for itching and visual disturbance.  Respiratory:  Negative for cough.   Cardiovascular:  Negative for chest pain, palpitations and leg swelling.  Gastrointestinal:  Negative for abdominal distention, blood in stool, diarrhea and nausea.  Genitourinary:  Negative for frequency and hematuria.  Musculoskeletal:  Positive for arthralgias and  gait problem. Negative for back pain, joint swelling and neck pain.  Skin:  Negative for rash.  Neurological:  Negative for dizziness, tremors, speech difficulty and weakness.  Psychiatric/Behavioral:  Negative for agitation, dysphoric mood and sleep disturbance. The patient is not nervous/anxious.     Objective:  BP (!) 148/86   Pulse (!) 52   Temp 98 F (36.7 C) (Oral)   Ht 5' 11 (1.803 m)   Wt 296 lb 9.6 oz (134.5 kg)   SpO2 96%   BMI 41.37 kg/m   BP Readings from Last 3 Encounters:  04/15/24 (!) 148/86  12/22/23 126/72  08/23/23 (!) 164/82    Wt Readings from Last 3 Encounters:  04/15/24 296 lb 9.6 oz  (134.5 kg)  12/22/23 291 lb (132 kg)  08/22/23 282 lb 3 oz (128 kg)    Physical Exam Constitutional:      General: He is not in acute distress.    Appearance: He is well-developed. He is obese.     Comments: NAD  Eyes:     Conjunctiva/sclera: Conjunctivae normal.     Pupils: Pupils are equal, round, and reactive to light.  Neck:     Thyroid : No thyromegaly.     Vascular: No JVD.  Cardiovascular:     Rate and Rhythm: Normal rate and regular rhythm.     Heart sounds: Normal heart sounds. No murmur heard.    No friction rub. No gallop.  Pulmonary:     Effort: Pulmonary effort is normal. No respiratory distress.     Breath sounds: Normal breath sounds. No wheezing or rales.  Chest:     Chest wall: No tenderness.  Abdominal:     General: Bowel sounds are normal. There is no distension.     Palpations: Abdomen is soft. There is no mass.     Tenderness: There is no abdominal tenderness. There is no guarding or rebound.  Musculoskeletal:        General: No tenderness. Normal range of motion.     Cervical back: Normal range of motion.  Lymphadenopathy:     Cervical: No cervical adenopathy.  Skin:    General: Skin is warm and dry.     Findings: No rash.  Neurological:     Mental Status: He is alert and oriented to person, place, and time.     Cranial Nerves: No cranial nerve deficit.     Motor: No abnormal muscle tone.     Coordination: Coordination normal.     Gait: Gait normal.     Deep Tendon Reflexes: Reflexes are normal and symmetric.  Psychiatric:        Behavior: Behavior normal.        Thought Content: Thought content normal.        Judgment: Judgment normal.   Antalgic gait  Lab Results  Component Value Date   WBC 9.4 08/23/2023   HGB 13.4 08/23/2023   HCT 41.9 08/23/2023   PLT 188 08/23/2023   GLUCOSE 201 (H) 08/23/2023   CHOL 207 (H) 08/22/2022   TRIG 159.0 (H) 08/22/2022   HDL 49.20 08/22/2022   LDLDIRECT 89.0 08/16/2016   LDLCALC 126 (H) 08/22/2022    ALT 21 08/22/2022   AST 20 08/22/2022   NA 137 08/23/2023   K 3.7 08/23/2023   CL 96 (L) 08/23/2023   CREATININE 1.07 08/23/2023   BUN 21 08/23/2023   CO2 30 08/23/2023   TSH 1.52 08/22/2022   PSA 0.00 (L) 08/22/2022   INR  0.91 01/06/2013   HGBA1C 6.7 (H) 08/14/2023   MICROALBUR 1.1 11/30/2009    DG Knee Right Port Result Date: 08/22/2023 CLINICAL DATA:  Right knee replacement. EXAM: PORTABLE RIGHT KNEE - 1-2 VIEW COMPARISON:  None Available. FINDINGS: Right knee arthroplasty in expected alignment. No periprosthetic lucency or fracture. There has been patellar resurfacing. Recent postsurgical change includes air and edema in the soft tissues and joint space. Anterior skin staples. IMPRESSION: Right knee arthroplasty without immediate postoperative complication. Electronically Signed   By: Andrea Gasman M.D.   On: 08/22/2023 18:02    Assessment & Plan:   Problem List Items Addressed This Visit     Coronary artery disease   No angina.  Good exercise tolerance He is cleared for his right knee surgery under spinal anesthesia      Diabetes mellitus (HCC)   Start Mounjaro      Relevant Medications   tirzepatide (MOUNJARO) 2.5 MG/0.5ML Pen   Essential hypertension   Blood pressure is well-controlled.  Continue with Farxiga , HCTZ and losartan  BP Readings from Last 3 Encounters:  04/15/24 (!) 148/86  12/22/23 126/72  08/23/23 (!) 164/82         Obstructive sleep apnea   On CPAP      S/P TKR (total knee replacement)   Healing well      Other Visit Diagnoses       Immunization due    -  Primary   Relevant Orders   Flu vaccine HIGH DOSE PF(Fluzone Trivalent) (Completed)         Meds ordered this encounter  Medications   tirzepatide (MOUNJARO) 2.5 MG/0.5ML Pen    Sig: Inject 2.5 mg into the skin once a week.    Dispense:  2 mL    Refill:  3      Follow-up: Return in about 3 months (around 07/16/2024) for a follow-up visit.  Marolyn Noel, MD

## 2024-04-15 NOTE — Telephone Encounter (Signed)
 Pharmacy Patient Advocate Encounter  Received notification from [facility] MEDICARE that Prior Authorization for Mounjaro 2.5mg /0.28ml auto-injectors has been APPROVED from 04/15/2024 to 04/15/2025.   PA #/Case ID/Reference # [id number]

## 2024-04-15 NOTE — Assessment & Plan Note (Signed)
 Healing well.

## 2024-04-28 ENCOUNTER — Ambulatory Visit: Admitting: Orthopaedic Surgery

## 2024-04-29 ENCOUNTER — Ambulatory Visit: Admitting: Orthopaedic Surgery

## 2024-04-29 ENCOUNTER — Encounter: Payer: Self-pay | Admitting: Orthopaedic Surgery

## 2024-04-29 ENCOUNTER — Other Ambulatory Visit (INDEPENDENT_AMBULATORY_CARE_PROVIDER_SITE_OTHER): Payer: Self-pay

## 2024-04-29 DIAGNOSIS — Z96651 Presence of right artificial knee joint: Secondary | ICD-10-CM

## 2024-04-29 NOTE — Progress Notes (Signed)
 Marcey comes in today at 64-month status post a right cemented total knee arthroplasty.  He has remote history of his left knee being replaced.  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  He said he is bored from retirement.  He says the knee is doing great and it would be further along if it was not for chronic distal tib-fib fracture with the deformity that he has on the right side.  On exam the right knee feels stable.  He has pretty good range of motion of it but he would like to have more flexion which is appropriate.  He does feel stable though.  2 views of the right knee show well-seated cemented total knee arthroplasty with no complicating features.  He will increase his activities as comfort allows.  Will see him back in 4 months with a final standing AP and lateral of his right knee at the 1 year visit.  He did have me fill a small mass in his right parascapular area on his back.  It does feel consistent with a lipoma but we will try to remember to examine this again at his next visit.  If it does not grow before then he will let us  know.

## 2024-04-30 ENCOUNTER — Encounter: Payer: Self-pay | Admitting: Internal Medicine

## 2024-05-02 ENCOUNTER — Other Ambulatory Visit: Payer: Self-pay | Admitting: Internal Medicine

## 2024-05-02 ENCOUNTER — Other Ambulatory Visit (HOSPITAL_COMMUNITY): Payer: Self-pay

## 2024-05-02 MED ORDER — TIRZEPATIDE 5 MG/0.5ML ~~LOC~~ SOAJ
5.0000 mg | SUBCUTANEOUS | 5 refills | Status: DC
  Filled 2024-05-02 – 2024-05-03 (×2): qty 2, 28d supply, fill #0
  Filled 2024-06-01: qty 2, 28d supply, fill #1

## 2024-05-03 ENCOUNTER — Ambulatory Visit: Admitting: Orthopaedic Surgery

## 2024-05-03 ENCOUNTER — Other Ambulatory Visit (HOSPITAL_COMMUNITY): Payer: Self-pay

## 2024-05-06 ENCOUNTER — Ambulatory Visit: Admitting: Orthopaedic Surgery

## 2024-05-07 ENCOUNTER — Other Ambulatory Visit (HOSPITAL_COMMUNITY): Payer: Self-pay

## 2024-05-17 ENCOUNTER — Encounter: Payer: Self-pay | Admitting: Radiology

## 2024-05-17 ENCOUNTER — Other Ambulatory Visit: Payer: Self-pay | Admitting: Internal Medicine

## 2024-05-18 ENCOUNTER — Other Ambulatory Visit (HOSPITAL_COMMUNITY): Payer: Self-pay

## 2024-05-18 MED ORDER — DIAZEPAM 5 MG PO TABS
5.0000 mg | ORAL_TABLET | ORAL | 1 refills | Status: AC | PRN
  Filled 2024-05-18: qty 60, 30d supply, fill #0
  Filled 2024-06-28: qty 60, 30d supply, fill #1

## 2024-06-06 ENCOUNTER — Other Ambulatory Visit: Payer: Self-pay | Admitting: Internal Medicine

## 2024-06-06 MED ORDER — HYDROCHLOROTHIAZIDE 25 MG PO TABS
25.0000 mg | ORAL_TABLET | Freq: Every day | ORAL | 2 refills | Status: AC
Start: 2024-06-06 — End: ?
  Filled 2024-06-06: qty 90, 90d supply, fill #0

## 2024-06-07 ENCOUNTER — Other Ambulatory Visit (HOSPITAL_COMMUNITY): Payer: Self-pay

## 2024-06-14 ENCOUNTER — Encounter: Payer: Self-pay | Admitting: Internal Medicine

## 2024-06-16 ENCOUNTER — Other Ambulatory Visit: Payer: Self-pay

## 2024-06-16 ENCOUNTER — Other Ambulatory Visit: Payer: Self-pay | Admitting: Internal Medicine

## 2024-06-16 ENCOUNTER — Other Ambulatory Visit (HOSPITAL_COMMUNITY): Payer: Self-pay

## 2024-06-16 DIAGNOSIS — C61 Malignant neoplasm of prostate: Secondary | ICD-10-CM

## 2024-06-16 DIAGNOSIS — E1140 Type 2 diabetes mellitus with diabetic neuropathy, unspecified: Secondary | ICD-10-CM

## 2024-06-16 MED ORDER — TIRZEPATIDE 7.5 MG/0.5ML ~~LOC~~ SOAJ
7.5000 mg | SUBCUTANEOUS | 3 refills | Status: DC
  Filled 2024-06-16: qty 2, 28d supply, fill #0

## 2024-06-29 ENCOUNTER — Other Ambulatory Visit: Payer: Self-pay

## 2024-06-29 ENCOUNTER — Other Ambulatory Visit (HOSPITAL_COMMUNITY): Payer: Self-pay

## 2024-07-06 ENCOUNTER — Encounter: Payer: Self-pay | Admitting: Internal Medicine

## 2024-07-07 ENCOUNTER — Other Ambulatory Visit: Payer: Self-pay | Admitting: Family

## 2024-07-07 ENCOUNTER — Other Ambulatory Visit (HOSPITAL_COMMUNITY): Payer: Self-pay

## 2024-07-07 MED ORDER — TIRZEPATIDE 10 MG/0.5ML ~~LOC~~ SOAJ
10.0000 mg | SUBCUTANEOUS | 1 refills | Status: AC
  Filled 2024-07-07: qty 6, 84d supply, fill #0

## 2024-07-22 ENCOUNTER — Other Ambulatory Visit: Payer: Self-pay | Admitting: Internal Medicine

## 2024-07-23 ENCOUNTER — Other Ambulatory Visit (HOSPITAL_COMMUNITY): Payer: Self-pay

## 2024-07-23 MED ORDER — DAPAGLIFLOZIN PROPANEDIOL 10 MG PO TABS
10.0000 mg | ORAL_TABLET | Freq: Every day | ORAL | 11 refills | Status: AC
  Filled 2024-07-23 – 2024-07-29 (×2): qty 30, 30d supply, fill #0

## 2024-07-29 ENCOUNTER — Other Ambulatory Visit (HOSPITAL_COMMUNITY): Payer: Self-pay

## 2024-07-29 ENCOUNTER — Other Ambulatory Visit: Payer: Self-pay

## 2024-08-02 ENCOUNTER — Other Ambulatory Visit (HOSPITAL_COMMUNITY): Payer: Self-pay

## 2024-08-17 ENCOUNTER — Telehealth: Payer: Self-pay

## 2024-08-17 DIAGNOSIS — Z0000 Encounter for general adult medical examination without abnormal findings: Secondary | ICD-10-CM

## 2024-08-17 DIAGNOSIS — I10 Essential (primary) hypertension: Secondary | ICD-10-CM

## 2024-08-17 NOTE — Telephone Encounter (Unsigned)
 Copied from CRM #[id number]. Topic: Clinical - Request for Lab/Test Order >> Aug 16, 2024  4:26 PM Nessti S wrote: Reason for CRM: pt called to order labs for basic metabolic panel and TSH

## 2024-08-19 NOTE — Telephone Encounter (Signed)
 I was able to speak with the pt and inform him that labs have been ordered.

## 2024-08-20 ENCOUNTER — Other Ambulatory Visit

## 2024-08-20 DIAGNOSIS — C61 Malignant neoplasm of prostate: Secondary | ICD-10-CM

## 2024-08-20 DIAGNOSIS — I10 Essential (primary) hypertension: Secondary | ICD-10-CM

## 2024-08-20 DIAGNOSIS — E1140 Type 2 diabetes mellitus with diabetic neuropathy, unspecified: Secondary | ICD-10-CM

## 2024-08-20 DIAGNOSIS — Z0000 Encounter for general adult medical examination without abnormal findings: Secondary | ICD-10-CM

## 2024-08-20 LAB — TSH: TSH: 1.56 u[IU]/mL (ref 0.35–5.50)

## 2024-08-20 LAB — HEMOGLOBIN A1C: Hgb A1c MFr Bld: 6.6 % — ABNORMAL HIGH (ref 4.6–6.5)

## 2024-08-20 LAB — BASIC METABOLIC PANEL WITH GFR
BUN: 31 mg/dL — ABNORMAL HIGH (ref 6–23)
CO2: 34 meq/L — ABNORMAL HIGH (ref 19–32)
Calcium: 9.7 mg/dL (ref 8.4–10.5)
Chloride: 99 meq/L (ref 96–112)
Creatinine, Ser: 1.33 mg/dL (ref 0.40–1.50)
GFR: 52.13 mL/min — ABNORMAL LOW
Glucose, Bld: 126 mg/dL — ABNORMAL HIGH (ref 70–99)
Potassium: 4.1 meq/L (ref 3.5–5.1)
Sodium: 142 meq/L (ref 135–145)

## 2024-08-20 LAB — PSA: PSA: 0.03 ng/mL — ABNORMAL LOW (ref 0.10–4.00)

## 2024-08-26 ENCOUNTER — Encounter: Admitting: Internal Medicine

## 2024-09-01 ENCOUNTER — Ambulatory Visit: Admitting: Orthopaedic Surgery
